# Patient Record
Sex: Male | Born: 1955 | Race: Black or African American | Hispanic: No | Marital: Single | Smoking: Current every day smoker
Health system: Southern US, Community
[De-identification: ages and names within clinical notes are randomized; demographics above are authoritative.]

## PROBLEM LIST (undated history)

## (undated) DIAGNOSIS — E785 Hyperlipidemia, unspecified: Secondary | ICD-10-CM

## (undated) DIAGNOSIS — I6529 Occlusion and stenosis of unspecified carotid artery: Secondary | ICD-10-CM

## (undated) DIAGNOSIS — I10 Essential (primary) hypertension: Secondary | ICD-10-CM

## (undated) DIAGNOSIS — R0600 Dyspnea, unspecified: Secondary | ICD-10-CM

## (undated) DIAGNOSIS — I4892 Unspecified atrial flutter: Secondary | ICD-10-CM

## (undated) DIAGNOSIS — I82409 Acute embolism and thrombosis of unspecified deep veins of unspecified lower extremity: Secondary | ICD-10-CM

## (undated) DIAGNOSIS — I2510 Atherosclerotic heart disease of native coronary artery without angina pectoris: Secondary | ICD-10-CM

## (undated) DIAGNOSIS — G959 Disease of spinal cord, unspecified: Secondary | ICD-10-CM

## (undated) DIAGNOSIS — R0609 Other forms of dyspnea: Secondary | ICD-10-CM

## (undated) DIAGNOSIS — F1910 Other psychoactive substance abuse, uncomplicated: Secondary | ICD-10-CM

## (undated) DIAGNOSIS — I739 Peripheral vascular disease, unspecified: Secondary | ICD-10-CM

## (undated) DIAGNOSIS — I509 Heart failure, unspecified: Secondary | ICD-10-CM

## (undated) DIAGNOSIS — G43909 Migraine, unspecified, not intractable, without status migrainosus: Secondary | ICD-10-CM

## (undated) DIAGNOSIS — D696 Thrombocytopenia, unspecified: Secondary | ICD-10-CM

## (undated) DIAGNOSIS — Z7901 Long term (current) use of anticoagulants: Secondary | ICD-10-CM

## (undated) HISTORY — DX: Acute embolism and thrombosis of unspecified deep veins of unspecified lower extremity: I82.409

## (undated) HISTORY — DX: Unspecified atrial flutter: I48.92

## (undated) HISTORY — DX: Heart failure, unspecified: I50.9

## (undated) HISTORY — DX: Thrombocytopenia, unspecified: D69.6

---

## 2014-07-11 ENCOUNTER — Emergency Department: Payer: Self-pay | Admitting: Emergency Medicine

## 2016-10-26 ENCOUNTER — Encounter: Payer: Self-pay | Admitting: Emergency Medicine

## 2016-10-26 ENCOUNTER — Emergency Department
Admission: EM | Admit: 2016-10-26 | Discharge: 2016-10-27 | Disposition: A | Discharge status: Home / Self Care | Payer: Self-pay | Attending: Emergency Medicine | Admitting: Emergency Medicine

## 2016-10-26 DIAGNOSIS — F10920 Alcohol use, unspecified with intoxication, uncomplicated: Secondary | ICD-10-CM

## 2016-10-26 DIAGNOSIS — Z5181 Encounter for therapeutic drug level monitoring: Secondary | ICD-10-CM | POA: Insufficient documentation

## 2016-10-26 DIAGNOSIS — R159 Full incontinence of feces: Secondary | ICD-10-CM | POA: Insufficient documentation

## 2016-10-26 DIAGNOSIS — F17210 Nicotine dependence, cigarettes, uncomplicated: Secondary | ICD-10-CM | POA: Insufficient documentation

## 2016-10-26 DIAGNOSIS — F1490 Cocaine use, unspecified, uncomplicated: Secondary | ICD-10-CM | POA: Insufficient documentation

## 2016-10-26 DIAGNOSIS — F10120 Alcohol abuse with intoxication, uncomplicated: Secondary | ICD-10-CM | POA: Insufficient documentation

## 2016-10-26 HISTORY — DX: Migraine, unspecified, not intractable, without status migrainosus: G43.909

## 2016-10-26 LAB — COMPREHENSIVE METABOLIC PANEL
ALK PHOS: 74 U/L (ref 38–126)
ALT: 27 U/L (ref 17–63)
ANION GAP: 11 (ref 5–15)
AST: 35 U/L (ref 15–41)
Albumin: 3.7 g/dL (ref 3.5–5.0)
BILIRUBIN TOTAL: 0.9 mg/dL (ref 0.3–1.2)
BUN: 8 mg/dL (ref 6–20)
CALCIUM: 8.9 mg/dL (ref 8.9–10.3)
CO2: 20 mmol/L — ABNORMAL LOW (ref 22–32)
Chloride: 102 mmol/L (ref 101–111)
Creatinine, Ser: 0.98 mg/dL (ref 0.61–1.24)
GFR calc non Af Amer: >60 mL/min (ref >60)
Glucose, Bld: 104 mg/dL — ABNORMAL HIGH (ref 65–99)
POTASSIUM: 3.2 mmol/L — AB (ref 3.5–5.1)
SODIUM: 133 mmol/L — AB (ref 135–145)
TOTAL PROTEIN: 8.2 g/dL — AB (ref 6.5–8.1)

## 2016-10-26 LAB — CBC
HCT: 43.3 % (ref 40.0–52.0)
HEMOGLOBIN: 15.2 g/dL (ref 13.0–18.0)
MCH: 38.8 pg — ABNORMAL HIGH (ref 26.0–34.0)
MCHC: 35.1 g/dL (ref 32.0–36.0)
MCV: 110.7 fL — ABNORMAL HIGH (ref 80.0–100.0)
PLATELETS: 287 10*3/uL (ref 150–440)
RBC: 3.91 MIL/uL — AB (ref 4.40–5.90)
RDW: 14.2 % (ref 11.5–14.5)
WBC: 9.7 10*3/uL (ref 3.8–10.6)

## 2016-10-26 LAB — TROPONIN I: Troponin I: <0.03 ng/mL (ref <0.03)

## 2016-10-26 LAB — ETHANOL: Alcohol, Ethyl (B): 209 mg/dL — ABNORMAL HIGH (ref <5)

## 2016-10-26 NOTE — ED Notes (Signed)
Pt states that he really doesn't have a complaint at this time,states that tonight while he was watching the ballgame with his family he lost bowel continence and states that he has never done that, pt states that he already had an appt to see a pcp next week because his family was concerned about him, he states that they don't feel like he looks right, states that they think he is swollen, pt states that he thought he was just picking up some weight, pt has swelling noted to his abd, none to his lower extremities, pt denies hx of copd or chf, lung sounds are diminished to bilat bases, pt has an open area of skin to his left index finger, pt states that he is uncertain what that is from. Pt states that he smokes and drinks regularly, pt denies any thoughts of SI, states that he made a comment about dying after he soiled himself. But states that he really doesn't want that to happen

## 2016-10-26 NOTE — ED Triage Notes (Signed)
Pt is ambulatory to triage with generalized body aches. Pt states that he lost control of his bowel earlier. Pt states that sometimes he just hurts. Pt states that this evening he stated to his his niece that he was ready to die. Pt states to this RN that he did not mean this. Pt is cooperative at this time with being evaluated for SI.

## 2016-10-26 NOTE — ED Notes (Addendum)
Pt brought to ED by niece; she states pt was incontinent of stool on her couch tonight; she told him he either needed to come for help or leave her house; she says pt has been expressing suicidal thoughts and he is a drug addict;

## 2016-10-26 NOTE — ED Triage Notes (Signed)
Pt states that he has drank two cans of beer this evening and has no feeling in his left index finger but denies trauma.

## 2016-10-26 NOTE — BH Assessment (Signed)
Assessment Note  Shawn Taylor is an 60 y.o. male. Shawn Taylor reports that his niece sent him to the ED after he had a bowel movement on the couch while they were watching football.  He states that he had no choice about coming.  He denied symptoms of depression or anxiety.  He denied having auditory or visual hallucinations.  He denied suicidal or homicidal ideation or intent.  He reports that "once upon a time" he used alcohol and drugs, "But no more".  He does report some body aches, "other than that I'm fine".  Though patient denied the use of alcohol or drugs, he has a BAL of 209. Per report from his niece he uses drugs and alcohol.  Diagnosis:   Past Medical History:  Past Medical History:  Diagnosis Date  . Migraines     History reviewed. No pertinent surgical history.  Family History: No family history on file.  Social History:  reports that he has been smoking Cigarettes.  He has been smoking about 0.50 packs per day. He has never used smokeless tobacco. He reports that he drinks about 1.2 - 1.8 oz of alcohol per week . He reports that he uses drugs, including Cocaine, about 1 time per week.  Additional Social History:  Alcohol / Drug Use History of alcohol / drug use?:  (reports use in the past but not current)  CIWA: CIWA-Ar BP: 126/84 Pulse Rate: 85 COWS:    Allergies: No Known Allergies  Home Medications:  (Not in a hospital admission)  OB/GYN Status:  No LMP for male patient.  General Assessment Data Location of Assessment: [facility] ED TTS Assessment: In system Is this a Tele or Face-to-Face Assessment?: Face-to-Face Is this an Initial Assessment or a Re-assessment for this encounter?: Initial Assessment Marital status: Single Maiden name: n/a Is patient pregnant?: No Pregnancy Status: No Living Arrangements: Other (Comment) ("Here and there, I'm homeless, but been staying with my niec) Can pt return to current living arrangement?:  (unknown) Admission Status:  Voluntary Is patient capable of signing voluntary admission?: Yes Referral Source: Self/Family/Friend Insurance type: None  Medical Screening Exam [facility] Walk-in ONLY) Medical Exam completed: Yes  Crisis Care Plan Living Arrangements: Other (Comment) ("Here and there, I'm homeless, but been staying with my niec) Legal Guardian: Other: (Self) Name of Psychiatrist: None Name of Therapist: None  Education Status Is patient currently in school?: No Current Grade: n/a Highest grade of school patient has completed: 10th, [employer] - GED Name of school: [place], [place] [employer] person: n/a  Risk to self with the past 6 months Suicidal Ideation: No Has patient been a risk to self within the past 6 months prior to admission? : No Suicidal Intent: No Has patient had any suicidal intent within the past 6 months prior to admission? : No Is patient at risk for suicide?: No Suicidal Plan?: No Has patient had any suicidal plan within the past 6 months prior to admission? : No Access to Means: No What has been your use of drugs/alcohol within the last 12 months?: Reports history of of drug use Previous Attempts/Gestures: No How many times?: 0 Other Self Harm Risks: denied Triggers for Past Attempts: None known Intentional Self Injurious Behavior: None Family Suicide History: No Recent stressful life event(s): Other (Comment) (homeless) Persecutory voices/beliefs?: No Depression: No Depression Symptoms:  (None reported) Substance abuse history and/or treatment for substance abuse?: Yes Suicide prevention information given to non-admitted patients: Not applicable  Risk to Others within  the past 6 months Homicidal Ideation: No Does patient have any lifetime risk of violence toward others beyond the six months prior to admission? : No Thoughts of Harm to Others: No Current Homicidal Intent: No Current Homicidal Plan: No Access to Homicidal Means: No Identified Victim: None  identified History of harm to others?: No Assessment of Violence: None Noted Violent Behavior Description: denied Does patient have access to weapons?: No Criminal Charges Pending?: No Does patient have a court date: No Is patient on probation?: No  Psychosis Hallucinations: None noted Delusions: None noted  Mental Status Report Appearance/Hygiene: In scrubs Eye Contact: Good Motor Activity: Unremarkable Speech: Logical/coherent Level of Consciousness: Alert Mood: Pleasant Affect: Appropriate to circumstance Anxiety Level: None Thought Processes: Coherent Judgement: Unimpaired Orientation: Person, Place, Time, Situation Obsessive Compulsive Thoughts/Behaviors: None  Cognitive Functioning Concentration: Normal Memory: Recent Intact IQ: Average Insight: Fair Impulse Control: Fair Appetite: Good Sleep: No Change Vegetative Symptoms: None  ADLScreening [facility] Assessment Services) Patient's cognitive ability adequate to safely complete daily activities?: Yes Patient able to express need for assistance with ADLs?: Yes Independently performs ADLs?: Yes (appropriate for developmental age)  Prior Inpatient Therapy Prior Inpatient Therapy: No Prior Therapy Dates: n/a Prior Therapy Facilty/Provider(s): n/a Reason for Treatment: n/a  Prior Outpatient Therapy Prior Outpatient Therapy: No Prior Therapy Dates: n/a Prior Therapy Facilty/Provider(s): n/a Reason for Treatment: n/a Does patient have an ACCT team?: No Does patient have Intensive In-House Services?  : No Does patient have [facility] services? : No Does patient have P4CC services?: No  ADL Screening (condition at time of admission) Patient's cognitive ability adequate to safely complete daily activities?: Yes Patient able to express need for assistance with ADLs?: Yes Independently performs ADLs?: Yes (appropriate for developmental age)       Abuse/Neglect Assessment (Assessment to be complete while patient is  alone) Physical Abuse: Denies Verbal Abuse: Denies Sexual Abuse: Denies Exploitation of patient/patient's resources: Denies Self-Neglect: Denies     Merchant navy officerAdvance Directives (For Healthcare) Does Patient Have a Medical Advance Directive?: No Would patient like information on creating a medical advance directive?: No - Patient declined    Additional Information 1:1 In Past 12 Months?: No CIRT Risk: No Elopement Risk: No Does patient have medical clearance?: Yes     Disposition:  Disposition Initial Assessment Completed for this Encounter: Yes Disposition of Patient: Other dispositions  On Site Evaluation by:   Reviewed with Physician:    Justice DeedsKeisha Merriel Zinger 10/26/2016 11:25 PM

## 2016-10-27 LAB — URINE DRUG SCREEN, QUALITATIVE (ARMC ONLY)
Amphetamines, Ur Screen: NOT DETECTED
BARBITURATES, UR SCREEN: NOT DETECTED
Benzodiazepine, Ur Scrn: NOT DETECTED
CANNABINOID 50 NG, UR ~~LOC~~: NOT DETECTED
COCAINE METABOLITE, UR ~~LOC~~: POSITIVE — AB
MDMA (ECSTASY) UR SCREEN: NOT DETECTED
METHADONE SCREEN, URINE: NOT DETECTED
Opiate, Ur Screen: NOT DETECTED
Phencyclidine (PCP) Ur S: NOT DETECTED
TRICYCLIC, UR SCREEN: NOT DETECTED

## 2016-10-27 MED ORDER — ADULT MULTIVITAMIN W/MINERALS CH
1.0000 | ORAL_TABLET | Freq: Once | ORAL | Status: AC
  Administered 2016-10-27: 1 via ORAL
  Filled 2016-10-27: qty 1

## 2016-10-27 MED ORDER — VITAMIN B-1 100 MG PO TABS
100.0000 mg | ORAL_TABLET | Freq: Once | ORAL | Status: AC
  Administered 2016-10-27: 100 mg via ORAL
  Filled 2016-10-27: qty 1

## 2016-10-27 MED ORDER — FOLIC ACID 1 MG PO TABS
1.0000 mg | ORAL_TABLET | Freq: Once | ORAL | Status: AC
  Administered 2016-10-27: 1 mg via ORAL
  Filled 2016-10-27: qty 1

## 2016-10-27 MED ORDER — MAGNESIUM OXIDE 400 (241.3 MG) MG PO TABS
400.0000 mg | ORAL_TABLET | Freq: Once | ORAL | Status: AC
  Administered 2016-10-27: 400 mg via ORAL
  Filled 2016-10-27: qty 1

## 2016-10-27 NOTE — ED Notes (Signed)
Pt resting quietly, eyes closed, with lights turned down for comfort, no distress noted, cont to monitor

## 2016-10-27 NOTE — ED Notes (Signed)
Dr Zenda Alpers at bedside with pt

## 2016-10-27 NOTE — ED Provider Notes (Signed)
[facility] Emergency Department Provider Note   ____________________________________________   First MD Initiated Contact with Patient 10/26/16 2335     (approximate)  I have reviewed the triage vital signs and the nursing notes.   HISTORY  Chief Complaint Generalized Body Aches    HPI Shawn Taylor is a 60 y.o. male who reports he is here tonight because of his knees. He reports that he was watching a football game tonight when he coughed and he had a small bowel movement. The patient reports that he didn't realize that he had to go. He reports that it like when you pass gas and some comes out as well. He reports that it wasn't a lot it was more like this words. His niece became concerned so she decided to have him come here. The patient reports that his family threatened him that if he didn't calm they would kick him out of the house. The patient states that his niece doesn't think he is well. They state that his face has been swelling and his abdomen is swelling. The patient reports that he made an appointment for a physical on December 16. He reports that he has some pain in his left hip that is chronic. He denies any chest pain or shortness of breath. The patient's niece did drop him off. He was drinking today about 3-4 beers and reports that he does drink daily. He reports that he thinks that may be he is sick but he is not sure. He thought he might of just been gaining some weight. The patient denies any chest pain, nausea, vomiting. He does have some occasional migraines. According to the patient he also urinates frequently. The patient's niece reports that he did make a statement about killing himself after he had the bowel movement. The patient reports that he stated that if things would benefit be the way that they are he might as well be dead. The patient's family reports that he drinks and does drugs and he has been talking to himself. They wanted him  evaluated. The patient deniesany complaints at this time.   Past Medical History:  Diagnosis Date  . Migraines     There are no active problems to display for this patient.   History reviewed. No pertinent surgical history.  Prior to Admission medications   Not on File    Allergies Patient has no known allergies.  No family history on file.  Social History Social History  Substance Use Topics  . Smoking status: Current Every Day Smoker    Packs/day: 0.50    Types: Cigarettes  . Smokeless tobacco: Never Used  . Alcohol use 1.2 - 1.8 oz/week    2 - 3 Cans of beer per week    Review of Systems Constitutional: No fever/chills Eyes: No visual changes. ENT: No sore throat. Cardiovascular: Denies chest pain. Respiratory: Denies shortness of breath. Gastrointestinal: No abdominal pain.  No nausea, no vomiting.  No diarrhea.  No constipation. Genitourinary: Negative for dysuria. Musculoskeletal: Negative for back pain. Skin: Negative for rash. Neurological: Negative for headaches, focal weakness or numbness.  10-point ROS otherwise negative.  ____________________________________________   PHYSICAL EXAM:  VITAL SIGNS: ED Triage Vitals [10/26/16 2132]  Enc Vitals Group     BP 126/84     Pulse Rate 85     Resp 18     Temp 97.6 F (36.4 C)     Temp Source Oral     SpO2 97 %  Weight 19 lb (8.618 kg)     Height 5\' 11"  (1.803 m)     Head Circumference      Peak Flow      Pain Score 0     Pain Loc      Pain Edu?      Excl. in GC?     Constitutional: Alert and oriented. Well appearing and in no acute distress. Eyes: Conjunctivae are normal. PERRL. EOMI. Head: Atraumatic. Nose: No congestion/rhinnorhea. Mouth/Throat: Mucous membranes are moist.  Oropharynx non-erythematous. Cardiovascular: Normal rate, regular rhythm. Grossly normal heart sounds.  Good peripheral circulation. Respiratory: Normal respiratory effort.  No retractions. Lungs  CTAB. Gastrointestinal: Soft and nontender. Abdominal distention. Positive bowel sounds Musculoskeletal: No lower extremity tenderness nor edema.   Neurologic:  Normal speech and language.  Skin:  Skin is warm, dry and intact.  Psychiatric: Mood and affect are normal.   ____________________________________________   LABS (all labs ordered are listed, but only abnormal results are displayed)  Labs Reviewed  COMPREHENSIVE METABOLIC PANEL - Abnormal; Notable for the following:       Result Value   Sodium 133 (*)    Potassium 3.2 (*)    CO2 20 (*)    Glucose, Bld 104 (*)    Total Protein 8.2 (*)    All other components within normal limits  ETHANOL - Abnormal; Notable for the following:    Alcohol, Ethyl (B) 209 (*)    All other components within normal limits  CBC - Abnormal; Notable for the following:    RBC 3.91 (*)    MCV 110.7 (*)    MCH 38.8 (*)    All other components within normal limits  URINE DRUG SCREEN, QUALITATIVE ([facility] ONLY) - Abnormal; Notable for the following:    Cocaine Metabolite,[place] POSITIVE (*)    All other components within normal limits  TROPONIN I   ____________________________________________  EKG  ED ECG REPORT I, Rebecka Apley, the attending physician, personally viewed and interpreted this ECG.   Date: 10/28/2016  EKG Time: 2157  Rate: 85  Rhythm: normal sinus rhythm  Axis: normal  Intervals:none  ST&T Change: none  ____________________________________________  RADIOLOGY  none ____________________________________________   PROCEDURES  Procedure(s) performed: None  Procedures  Critical Care performed: No  ____________________________________________   INITIAL IMPRESSION / ASSESSMENT AND PLAN / ED COURSE  Pertinent labs & imaging results that were available during my care of the patient were reviewed by me and considered in my medical decision making (see chart for details).  This is a 60 year old male who was sent  into the hospital today by his niece. The patient denies any complaints at this time. I will have the patient evaluated by the specialist on-call. The patient does have a prolonged QTC so I will give him some magnesium oxide. The patient's ethanol is elevated but the remainder of his blood work is unremarkable.  Clinical Course    It is possible that the patient's fecal incontinence is due to his substance abuse. He does have cocaine in his urine and he is intoxicated. The patient's family is very frustrated with him and they report that they don't want to have anything to do with him. I will have the specialist on call evaluate him to determine if the patient needs any inpatient therapy. I discussed with the family though that he may need some outpatient help and there may not be much that we can do for him in the hospital.  The patient was seen by specialist on call who feels that the patient can be seen as an outpatient. I did offer the patient a rectal exam given his fecal incontinence but he declined. I will discharge the patient to have him follow up with his appointment which she reports as already scheduled next week.  ____________________________________________   FINAL CLINICAL IMPRESSION(S) / ED DIAGNOSES  Final diagnoses:  Alcoholic intoxication without complication (HCC)  Incontinence of feces, unspecified fecal incontinence type      NEW MEDICATIONS STARTED DURING THIS VISIT:  New Prescriptions   No medications on file     Note:  This document was prepared using Dragon voice recognition software and may include unintentional dictation errors.    Rebecka Apley, MD 10/27/16 [phone]

## 2016-10-27 NOTE — ED Notes (Signed)
Pt waiting on belongings for discharge. Family will be here for pt in next 30 minutes.

## 2016-10-27 NOTE — ED Notes (Signed)
Belongings returned to pt. Pt getting dressed.

## 2016-10-27 NOTE — ED Notes (Addendum)
Pt resting quietly, cont to monitor °

## 2017-11-09 ENCOUNTER — Other Ambulatory Visit: Payer: Self-pay

## 2017-11-09 ENCOUNTER — Emergency Department: Payer: Self-pay

## 2017-11-09 ENCOUNTER — Encounter: Payer: Self-pay | Admitting: Emergency Medicine

## 2017-11-09 ENCOUNTER — Emergency Department
Admission: EM | Admit: 2017-11-09 | Discharge: 2017-11-09 | Disposition: A | Discharge status: Home / Self Care | Payer: Self-pay | Attending: Emergency Medicine | Admitting: Emergency Medicine

## 2017-11-09 DIAGNOSIS — F17210 Nicotine dependence, cigarettes, uncomplicated: Secondary | ICD-10-CM | POA: Insufficient documentation

## 2017-11-09 DIAGNOSIS — J209 Acute bronchitis, unspecified: Secondary | ICD-10-CM | POA: Insufficient documentation

## 2017-11-09 MED ORDER — PREDNISONE 50 MG PO TABS: ORAL_TABLET | ORAL | 0 refills | Status: DC

## 2017-11-09 MED ORDER — AZITHROMYCIN 250 MG PO TABS: ORAL_TABLET | ORAL | 0 refills | Status: AC

## 2017-11-09 NOTE — ED Notes (Signed)
Pt has a cough for several weeks.  Pt coughing up beige phlegm.  No fever.  No chest pain or sob.  Pt reports pain in bil rib area from coughing so much.   cig smoker.  Pt alert.

## 2017-11-09 NOTE — ED Triage Notes (Signed)
Cough x 1 month, no apparent distress.

## 2017-11-09 NOTE — ED Provider Notes (Signed)
[facility] Emergency Department Provider Note  ____________________________________________  Time seen: Approximately 6:07 PM  I have reviewed the triage vital signs and the nursing notes.   HISTORY  Chief Complaint Cough    HPI Shawn Taylor is a 61 y.o. male presents to the emergency department with productive cough or purulent sputum production for the past 1 month.  Patient denies associated shortness of breath tightness.  Patient reports that his symptoms originally started with a viral upper respiratory tract infection.  Patient denies fatigue or weakness.  No recent travel.  Patient is tolerating fluids by mouth.  Patient is a daily smoker.   Past Medical History:  Diagnosis Date  . Migraines     There are no active problems to display for this patient.   History reviewed. No pertinent surgical history.  Prior to Admission medications   Medication Sig Start Date End Date Taking? Authorizing Provider  azithromycin (ZITHROMAX Z-PAK) 250 MG tablet Take 2 tablets (500 mg) on  Day 1,  followed by 1 tablet (250 mg) once daily on Days 2 through 5. 11/09/17 11/14/17  Orvil FeilWoods, Amaryah Mallen M, PA-C  predniSONE (DELTASONE) 50 MG tablet Take one 50 mg tablet once a day for 5 days. 11/09/17   Orvil FeilWoods, Merna Baldi M, PA-C    Allergies Patient has no known allergies.  No family history on file.  Social History Social History   Tobacco Use  . Smoking status: Current Every Day Smoker    Packs/day: 0.50    Types: Cigarettes  . Smokeless tobacco: Never Used  Substance Use Topics  . Alcohol use: Yes    Alcohol/week: 1.2 - 1.8 oz    Types: 2 - 3 Cans of beer per week  . Drug use: Yes    Frequency: 1.0 times per week    Types: Cocaine     Review of Systems  Constitutional: No fever/chills Eyes: No visual changes. No discharge ENT: No upper respiratory complaints. Cardiovascular: no chest pain. Respiratory: Patient has productive cough.  No  SOB. Musculoskeletal: Negative for musculoskeletal pain. Skin: Negative for rash, abrasions, lacerations, ecchymosis. Neurological: Negative for headaches, focal weakness or numbness.   ____________________________________________   PHYSICAL EXAM:  VITAL SIGNS: ED Triage Vitals  Enc Vitals Group     BP 11/09/17 1655 136/66     Pulse Rate 11/09/17 1655 76     Resp 11/09/17 1655 20     Temp 11/09/17 1655 (!) 97.5 F (36.4 C)     Temp Source 11/09/17 1655 Oral     SpO2 11/09/17 1655 94 %     Weight 11/09/17 1656 190 lb (86.2 kg)     Height 11/09/17 1656 5\' 10"  (1.778 m)     Head Circumference --      Peak Flow --      Pain Score --      Pain Loc --      Pain Edu? --      Excl. in GC? --      Constitutional: Alert and oriented. Well appearing and in no acute distress. Eyes: Conjunctivae are normal. PERRL. EOMI. Head: Atraumatic. ENT:      Ears: TMs are pearly bilaterally.      Nose: No congestion/rhinnorhea.      Mouth/Throat: Mucous membranes are moist.  Cardiovascular: Normal rate, regular rhythm. Normal S1 and S2.  Good peripheral circulation. Respiratory: Normal respiratory effort without tachypnea or retractions. Lungs CTAB. Good air entry to the bases with no decreased or absent breath  sounds. Skin:  Skin is warm, dry and intact. No rash noted. Psychiatric: Mood and affect are normal. Speech and behavior are normal. Patient exhibits appropriate insight and judgement.   ____________________________________________   LABS (all labs ordered are listed, but only abnormal results are displayed)  Labs Reviewed - No data to display ____________________________________________  EKG   ____________________________________________  RADIOLOGY Geraldo Pitter, personally viewed and evaluated these images (plain radiographs) as part of my medical decision making, as well as reviewing the written report by the radiologist.  Dg Chest 2 View  Result Date:  11/09/2017 CLINICAL DATA:  Cough for 1 month EXAM: CHEST  2 VIEW COMPARISON:  None. FINDINGS: Hyperinflation. Mild bronchitic changes. No focal consolidation or effusion. Borderline cardiomegaly. Aortic atherosclerosis. No pneumothorax. IMPRESSION: 1. Hyperinflation with mild bronchitic changes. No focal pulmonary opacity is seen 2. Borderline cardiomegaly Electronically Signed   By: Jasmine Pang M.D.   On: 11/09/2017 17:41    ____________________________________________    PROCEDURES  Procedure(s) performed:    Procedures    Medications - No data to display   ____________________________________________   INITIAL IMPRESSION / ASSESSMENT AND PLAN / ED COURSE  Pertinent labs & imaging results that were available during my care of the patient were reviewed by me and considered in my medical decision making (see chart for details).  Review of the [place] CSRS was performed in accordance of the [facility] prior to dispensing any controlled drugs.     Assessment and plan Acute bronchitis Patient presented to the emergency department with productive cough for approximately 1 month.  Differential diagnosis included acute bronchitis versus community-acquired pneumonia.  Patient had no consolidations visualized on chest x-ray and has been without fatigue, shortness of breath and fever, decreasing suspicion for community-acquired pneumonia.  Patient was treated empirically for acute bronchitis with azithromycin and prednisone and advised to follow-up with primary care as needed.  All patient questions were answered.    ____________________________________________  FINAL CLINICAL IMPRESSION(S) / ED DIAGNOSES  Final diagnoses:  Acute bronchitis, unspecified organism      NEW MEDICATIONS STARTED DURING THIS VISIT:  ED Discharge Orders        Ordered    azithromycin (ZITHROMAX Z-PAK) 250 MG tablet     11/09/17 1800    predniSONE (DELTASONE) 50 MG tablet     11/09/17 1800           This chart was dictated using voice recognition software/Dragon. Despite best efforts to proofread, errors can occur which can change the meaning. Any change was purely unintentional.    Orvil Feil, PA-C 11/09/17 1810    Dionne Bucy, MD 11/09/17 2122

## 2017-11-23 ENCOUNTER — Other Ambulatory Visit: Payer: Self-pay

## 2017-11-23 ENCOUNTER — Emergency Department: Payer: Self-pay

## 2017-11-23 ENCOUNTER — Emergency Department
Admission: EM | Admit: 2017-11-23 | Discharge: 2017-11-23 | Disposition: A | Discharge status: Home / Self Care | Payer: Self-pay | Attending: Emergency Medicine | Admitting: Emergency Medicine

## 2017-11-23 DIAGNOSIS — R059 Cough, unspecified: Secondary | ICD-10-CM

## 2017-11-23 DIAGNOSIS — R05 Cough: Secondary | ICD-10-CM | POA: Insufficient documentation

## 2017-11-23 DIAGNOSIS — R202 Paresthesia of skin: Secondary | ICD-10-CM | POA: Insufficient documentation

## 2017-11-23 DIAGNOSIS — R55 Syncope and collapse: Secondary | ICD-10-CM | POA: Insufficient documentation

## 2017-11-23 DIAGNOSIS — F17210 Nicotine dependence, cigarettes, uncomplicated: Secondary | ICD-10-CM | POA: Insufficient documentation

## 2017-11-23 LAB — URINALYSIS, COMPLETE (UACMP) WITH MICROSCOPIC
BACTERIA UA: NONE SEEN
BILIRUBIN URINE: NEGATIVE
GLUCOSE, UA: NEGATIVE mg/dL
KETONES UR: NEGATIVE mg/dL
Leukocytes, UA: NEGATIVE
NITRITE: NEGATIVE
PH: 5 (ref 5.0–8.0)
Protein, ur: NEGATIVE mg/dL
Specific Gravity, Urine: 1.005 (ref 1.005–1.030)

## 2017-11-23 LAB — CBC
HEMATOCRIT: 43.1 % (ref 40.0–52.0)
Hemoglobin: 14.7 g/dL (ref 13.0–18.0)
MCH: 31.7 pg (ref 26.0–34.0)
MCHC: 34.1 g/dL (ref 32.0–36.0)
MCV: 92.8 fL (ref 80.0–100.0)
PLATELETS: 278 10*3/uL (ref 150–440)
RBC: 4.65 MIL/uL (ref 4.40–5.90)
RDW: 13.6 % (ref 11.5–14.5)
WBC: 7.1 10*3/uL (ref 3.8–10.6)

## 2017-11-23 LAB — BASIC METABOLIC PANEL
ANION GAP: 10 (ref 5–15)
BUN: 7 mg/dL (ref 6–20)
CO2: 24 mmol/L (ref 22–32)
Calcium: 8.2 mg/dL — ABNORMAL LOW (ref 8.9–10.3)
Chloride: 97 mmol/L — ABNORMAL LOW (ref 101–111)
Creatinine, Ser: 0.85 mg/dL (ref 0.61–1.24)
Glucose, Bld: 115 mg/dL — ABNORMAL HIGH (ref 65–99)
POTASSIUM: 3.6 mmol/L (ref 3.5–5.1)
SODIUM: 131 mmol/L — AB (ref 135–145)

## 2017-11-23 MED ORDER — ALBUTEROL SULFATE HFA 108 (90 BASE) MCG/ACT IN AERS: 2.0000 | INHALATION_SPRAY | Freq: Four times a day (QID) | RESPIRATORY_TRACT | 0 refills | Status: DC | PRN

## 2017-11-23 MED ORDER — BENZONATATE 100 MG PO CAPS: 100.0000 mg | ORAL_CAPSULE | Freq: Four times a day (QID) | ORAL | 0 refills | Status: AC | PRN

## 2017-11-23 NOTE — ED Triage Notes (Signed)
Pt reports cough x1.5 months, reports syncopal episode today, unwitnessed. Pt denies headache or neck pain.

## 2017-11-23 NOTE — ED Provider Notes (Signed)
[facility] Emergency Department Provider Note _________________________________________   I have reviewed the triage vital signs and the nursing notes.   HISTORY  Chief Complaint Loss of Consciousness   History limited by: Not Limited   HPI Shawn Taylor is a 61 y.o. male who presents to the emergency department today because of concern for syncope and continued cough.  TIMING: occurred today SEVERITY: LOC CONTEXT: patient states that he has had a cough for over one week. Was seen in the emergency department for the cough and states he was given steroids and antibiotics which have not given any significant relief. Today during a coughing fit he passed out.  MODIFYING FACTORS: coughing is worse at night ASSOCIATED SYMPTOMS: denies any fevers. Denies any chest pain except for some with cough.  ADDITIONAL COMPLAINTS: left hand and foot numbness which has been going on for months. Has not seen anyone for it.  Per medical record review patient has a history of migraine.   Past Medical History:  Diagnosis Date  . Migraines     There are no active problems to display for this patient.   No past surgical history on file.  Prior to Admission medications   Medication Sig Start Date End Date Taking? Authorizing Provider  predniSONE (DELTASONE) 50 MG tablet Take one 50 mg tablet once a day for 5 days. 11/09/17   Orvil FeilWoods, Jaclyn M, PA-C    Allergies Patient has no known allergies.  No family history on file.  Social History Social History   Tobacco Use  . Smoking status: Current Every Day Smoker    Packs/day: 0.50    Types: Cigarettes  . Smokeless tobacco: Never Used  Substance Use Topics  . Alcohol use: Yes    Alcohol/week: 1.2 - 1.8 oz    Types: 2 - 3 Cans of beer per week  . Drug use: Yes    Frequency: 1.0 times per week    Types: Cocaine    Review of Systems Constitutional: No fever/chills Eyes: No visual changes. ENT: No sore  throat. Cardiovascular: Positive for chest pain. Respiratory: Positive for cough. Gastrointestinal: No abdominal pain.  No nausea, no vomiting.  No diarrhea.   Genitourinary: Negative for dysuria. Musculoskeletal: Negative for back pain. Skin: Negative for rash. Neurological: Positive for left arm and leg numbness.  ____________________________________________   PHYSICAL EXAM:  VITAL SIGNS: ED Triage Vitals  Enc Vitals Group     BP 11/23/17 1548 (!) 110/91     Pulse Rate 11/23/17 1548 88     Resp 11/23/17 1548 16     Temp 11/23/17 1548 98 F (36.7 C)     Temp Source 11/23/17 1548 Oral     SpO2 11/23/17 1548 97 %     Weight 11/23/17 1548 190 lb (86.2 kg)     Height 11/23/17 1548 5\' 10"  (1.778 m)     Head Circumference --      Peak Flow --      Pain Score 11/23/17 1550 0   Constitutional: Alert and oriented. Well appearing and in no distress. Eyes: Conjunctivae are normal.  ENT   Head: Normocephalic and atraumatic.   Nose: No congestion/rhinnorhea.   Mouth/Throat: Mucous membranes are moist.   Neck: No stridor. Hematological/Lymphatic/Immunilogical: No cervical lymphadenopathy. Cardiovascular: Normal rate, regular rhythm.  No murmurs, rubs, or gallops.  Respiratory: Normal respiratory effort without tachypnea nor retractions. Breath sounds are clear and equal bilaterally. No wheezes/rales/rhonchi. Gastrointestinal: Soft and non tender. No rebound. No guarding.  Genitourinary: Deferred Musculoskeletal: Normal range of motion in all extremities. No lower extremity edema. Neurologic:  Normal speech and language. No gross focal neurologic deficits are appreciated.  Skin:  Skin is warm, dry and intact. No rash noted. Psychiatric: Mood and affect are normal. Speech and behavior are normal. Patient exhibits appropriate insight and judgment.  ____________________________________________    LABS (pertinent positives/negatives)  CBC wnl BMP na 131, glu 115, cr  0.85 UA not consistent with infection  ____________________________________________   EKG  I, Phineas Semen, attending physician, personally viewed and interpreted this EKG  EKG Time: 1550 Rate: 84 Rhythm: sinus rhythm with PAC Axis: normal Intervals: qtc 465 QRS: narrow, lvh ST changes: no st elevation Impression: abnormal ekg   ____________________________________________    RADIOLOGY  CT head/cervical spine No acute fracture or bleed  ____________________________________________   PROCEDURES  Procedures  ____________________________________________   INITIAL IMPRESSION / ASSESSMENT AND PLAN / ED COURSE  Pertinent labs & imaging results that were available during my care of the patient were reviewed by me and considered in my medical decision making (see chart for details).  Patient presented to the emergency department today with primary complaint of a syncopal episode that occurred during a coughing fit and secondary complaint of continued cough. Patient had negative x-ray done at previous ED visit. No abnormal breath sounds, fevers or leukocytosis to suggest pneumonia. Patient did fall, head CT and cervical spine CT negative for acute fracture. Mildly hyponatremic, however was hyponatremic at previous visit and doubt this would cause the syncopal episode. At this point think likely patient with bronchitis, will give prescription for cough medication and inhaler. Discussed plan with patient. Discussed importance of follow up with PCP, will give PCP resources.    ____________________________________________   FINAL CLINICAL IMPRESSION(S) / ED DIAGNOSES  Final diagnoses:  Cough  Syncope, unspecified syncope type  Paresthesia     Note: This dictation was prepared with Dragon dictation. Any transcriptional errors that result from this process are unintentional     Phineas Semen, MD 11/23/17 [phone]

## 2017-11-23 NOTE — ED Notes (Signed)
AAOx3.  Skin warm and dry.  NAD 

## 2017-11-23 NOTE — Discharge Instructions (Signed)
Please seek medical attention for any high fevers, chest pain, shortness of breath, change in behavior, persistent vomiting, bloody stool or any other new or concerning symptoms.  

## 2017-11-24 DIAGNOSIS — B1920 Unspecified viral hepatitis C without hepatic coma: Secondary | ICD-10-CM

## 2017-11-24 HISTORY — DX: Unspecified viral hepatitis C without hepatic coma: B19.20

## 2018-04-06 ENCOUNTER — Emergency Department: Payer: Self-pay

## 2018-04-06 ENCOUNTER — Emergency Department
Admission: EM | Admit: 2018-04-06 | Discharge: 2018-04-06 | Disposition: A | Discharge status: Home / Self Care | Payer: Self-pay | Attending: Emergency Medicine | Admitting: Emergency Medicine

## 2018-04-06 ENCOUNTER — Other Ambulatory Visit: Payer: Self-pay

## 2018-04-06 DIAGNOSIS — Z23 Encounter for immunization: Secondary | ICD-10-CM | POA: Insufficient documentation

## 2018-04-06 DIAGNOSIS — T148XXA Other injury of unspecified body region, initial encounter: Secondary | ICD-10-CM

## 2018-04-06 DIAGNOSIS — Y9389 Activity, other specified: Secondary | ICD-10-CM | POA: Insufficient documentation

## 2018-04-06 DIAGNOSIS — S20211A Contusion of right front wall of thorax, initial encounter: Secondary | ICD-10-CM | POA: Insufficient documentation

## 2018-04-06 DIAGNOSIS — F1410 Cocaine abuse, uncomplicated: Secondary | ICD-10-CM | POA: Insufficient documentation

## 2018-04-06 DIAGNOSIS — S298XXA Other specified injuries of thorax, initial encounter: Secondary | ICD-10-CM

## 2018-04-06 DIAGNOSIS — W108XXA Fall (on) (from) other stairs and steps, initial encounter: Secondary | ICD-10-CM | POA: Insufficient documentation

## 2018-04-06 DIAGNOSIS — S50312A Abrasion of left elbow, initial encounter: Secondary | ICD-10-CM | POA: Insufficient documentation

## 2018-04-06 DIAGNOSIS — Y9289 Other specified places as the place of occurrence of the external cause: Secondary | ICD-10-CM | POA: Insufficient documentation

## 2018-04-06 DIAGNOSIS — Y998 Other external cause status: Secondary | ICD-10-CM | POA: Insufficient documentation

## 2018-04-06 DIAGNOSIS — F17210 Nicotine dependence, cigarettes, uncomplicated: Secondary | ICD-10-CM | POA: Insufficient documentation

## 2018-04-06 MED ORDER — PREDNISONE 10 MG PO TABS: 40.0000 mg | ORAL_TABLET | Freq: Every day | ORAL | 0 refills | Status: DC

## 2018-04-06 MED ORDER — TRAMADOL HCL 50 MG PO TABS: 50.0000 mg | ORAL_TABLET | Freq: Four times a day (QID) | ORAL | 0 refills | Status: DC | PRN

## 2018-04-06 MED ORDER — TETANUS-DIPHTH-ACELL PERTUSSIS 5-2.5-18.5 LF-MCG/0.5 IM SUSP
0.5000 mL | Freq: Once | INTRAMUSCULAR | Status: AC
  Administered 2018-04-06: 0.5 mL via INTRAMUSCULAR
  Filled 2018-04-06: qty 0.5

## 2018-04-06 MED ORDER — BACITRACIN ZINC 500 UNIT/GM EX OINT
1.0000 "application " | TOPICAL_OINTMENT | Freq: Once | CUTANEOUS | Status: AC
  Administered 2018-04-06: 1 via TOPICAL
  Filled 2018-04-06: qty 0.9
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
## 2018-04-06 NOTE — Discharge Instructions (Addendum)
Follow-up with your regular doctor if not better in 3 to 5 days.  Return to the emergency department if you are worsening.  Use medication as prescribed.  Apply ice to your ribs.  This will help with the pain and swelling in this area.
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
## 2020-02-12 DIAGNOSIS — I469 Cardiac arrest, cause unspecified: Secondary | ICD-10-CM

## 2020-02-12 DIAGNOSIS — I219 Acute myocardial infarction, unspecified: Secondary | ICD-10-CM

## 2020-02-12 HISTORY — DX: Acute myocardial infarction, unspecified: I21.9

## 2020-02-12 HISTORY — DX: Cardiac arrest, cause unspecified: I46.9

## 2020-02-13 ENCOUNTER — Inpatient Hospital Stay: Payer: Medicaid Other

## 2020-02-13 ENCOUNTER — Other Ambulatory Visit: Payer: Self-pay

## 2020-02-13 ENCOUNTER — Emergency Department: Payer: Medicaid Other

## 2020-02-13 ENCOUNTER — Inpatient Hospital Stay
Admission: EM | Admit: 2020-02-13 | Discharge: 2020-03-07 | DRG: 291 | Disposition: A | Discharge status: Home Health | Payer: Medicaid Other | Attending: Internal Medicine | Admitting: Internal Medicine

## 2020-02-13 DIAGNOSIS — Z7289 Other problems related to lifestyle: Secondary | ICD-10-CM

## 2020-02-13 DIAGNOSIS — K7290 Hepatic failure, unspecified without coma: Secondary | ICD-10-CM | POA: Diagnosis present

## 2020-02-13 DIAGNOSIS — F05 Delirium due to known physiological condition: Secondary | ICD-10-CM | POA: Diagnosis not present

## 2020-02-13 DIAGNOSIS — F17210 Nicotine dependence, cigarettes, uncomplicated: Secondary | ICD-10-CM | POA: Diagnosis present

## 2020-02-13 DIAGNOSIS — G9341 Metabolic encephalopathy: Secondary | ICD-10-CM | POA: Diagnosis present

## 2020-02-13 DIAGNOSIS — J9602 Acute respiratory failure with hypercapnia: Secondary | ICD-10-CM | POA: Diagnosis present

## 2020-02-13 DIAGNOSIS — E162 Hypoglycemia, unspecified: Secondary | ICD-10-CM | POA: Diagnosis present

## 2020-02-13 DIAGNOSIS — Z515 Encounter for palliative care: Secondary | ICD-10-CM | POA: Diagnosis not present

## 2020-02-13 DIAGNOSIS — I484 Atypical atrial flutter: Secondary | ICD-10-CM | POA: Diagnosis present

## 2020-02-13 DIAGNOSIS — Z7952 Long term (current) use of systemic steroids: Secondary | ICD-10-CM

## 2020-02-13 DIAGNOSIS — I5021 Acute systolic (congestive) heart failure: Secondary | ICD-10-CM

## 2020-02-13 DIAGNOSIS — Z4659 Encounter for fitting and adjustment of other gastrointestinal appliance and device: Secondary | ICD-10-CM

## 2020-02-13 DIAGNOSIS — N170 Acute kidney failure with tubular necrosis: Secondary | ICD-10-CM | POA: Diagnosis present

## 2020-02-13 DIAGNOSIS — I10 Essential (primary) hypertension: Secondary | ICD-10-CM

## 2020-02-13 DIAGNOSIS — I5023 Acute on chronic systolic (congestive) heart failure: Secondary | ICD-10-CM | POA: Diagnosis present

## 2020-02-13 DIAGNOSIS — R0902 Hypoxemia: Secondary | ICD-10-CM

## 2020-02-13 DIAGNOSIS — Z452 Encounter for adjustment and management of vascular access device: Secondary | ICD-10-CM

## 2020-02-13 DIAGNOSIS — E871 Hypo-osmolality and hyponatremia: Secondary | ICD-10-CM | POA: Diagnosis present

## 2020-02-13 DIAGNOSIS — F1410 Cocaine abuse, uncomplicated: Secondary | ICD-10-CM | POA: Diagnosis present

## 2020-02-13 DIAGNOSIS — R042 Hemoptysis: Secondary | ICD-10-CM | POA: Diagnosis not present

## 2020-02-13 DIAGNOSIS — R1310 Dysphagia, unspecified: Secondary | ICD-10-CM | POA: Diagnosis not present

## 2020-02-13 DIAGNOSIS — I4892 Unspecified atrial flutter: Secondary | ICD-10-CM | POA: Diagnosis present

## 2020-02-13 DIAGNOSIS — R570 Cardiogenic shock: Secondary | ICD-10-CM | POA: Diagnosis present

## 2020-02-13 DIAGNOSIS — R079 Chest pain, unspecified: Secondary | ICD-10-CM

## 2020-02-13 DIAGNOSIS — J9601 Acute respiratory failure with hypoxia: Secondary | ICD-10-CM

## 2020-02-13 DIAGNOSIS — D72829 Elevated white blood cell count, unspecified: Secondary | ICD-10-CM

## 2020-02-13 DIAGNOSIS — I82433 Acute embolism and thrombosis of popliteal vein, bilateral: Secondary | ICD-10-CM | POA: Diagnosis present

## 2020-02-13 DIAGNOSIS — F1010 Alcohol abuse, uncomplicated: Secondary | ICD-10-CM | POA: Diagnosis present

## 2020-02-13 DIAGNOSIS — I509 Heart failure, unspecified: Secondary | ICD-10-CM

## 2020-02-13 DIAGNOSIS — I82403 Acute embolism and thrombosis of unspecified deep veins of lower extremity, bilateral: Secondary | ICD-10-CM | POA: Diagnosis present

## 2020-02-13 DIAGNOSIS — R0600 Dyspnea, unspecified: Secondary | ICD-10-CM

## 2020-02-13 DIAGNOSIS — D696 Thrombocytopenia, unspecified: Secondary | ICD-10-CM | POA: Diagnosis not present

## 2020-02-13 DIAGNOSIS — I469 Cardiac arrest, cause unspecified: Secondary | ICD-10-CM | POA: Diagnosis present

## 2020-02-13 DIAGNOSIS — Z20822 Contact with and (suspected) exposure to covid-19: Secondary | ICD-10-CM | POA: Diagnosis present

## 2020-02-13 DIAGNOSIS — R1110 Vomiting, unspecified: Secondary | ICD-10-CM

## 2020-02-13 DIAGNOSIS — N179 Acute kidney failure, unspecified: Secondary | ICD-10-CM

## 2020-02-13 DIAGNOSIS — I110 Hypertensive heart disease with heart failure: Secondary | ICD-10-CM | POA: Diagnosis present

## 2020-02-13 DIAGNOSIS — I82453 Acute embolism and thrombosis of peroneal vein, bilateral: Secondary | ICD-10-CM | POA: Diagnosis present

## 2020-02-13 DIAGNOSIS — Z79899 Other long term (current) drug therapy: Secondary | ICD-10-CM

## 2020-02-13 DIAGNOSIS — I82442 Acute embolism and thrombosis of left tibial vein: Secondary | ICD-10-CM | POA: Diagnosis present

## 2020-02-13 DIAGNOSIS — J811 Chronic pulmonary edema: Secondary | ICD-10-CM | POA: Diagnosis present

## 2020-02-13 DIAGNOSIS — E872 Acidosis: Secondary | ICD-10-CM | POA: Diagnosis present

## 2020-02-13 DIAGNOSIS — D649 Anemia, unspecified: Secondary | ICD-10-CM | POA: Diagnosis not present

## 2020-02-13 DIAGNOSIS — R0602 Shortness of breath: Secondary | ICD-10-CM

## 2020-02-13 HISTORY — DX: Essential (primary) hypertension: I10

## 2020-02-13 LAB — BLOOD GAS, ARTERIAL
Acid-base deficit: 10.5 mmol/L — ABNORMAL HIGH (ref 0.0–2.0)
Acid-base deficit: 15.9 mmol/L — ABNORMAL HIGH (ref 0.0–2.0)
Bicarbonate: 13.2 mmol/L — ABNORMAL LOW (ref 20.0–28.0)
Bicarbonate: 8.2 mmol/L — ABNORMAL LOW (ref 20.0–28.0)
FIO2: 0.4
FIO2: 1
O2 Saturation: 94.7 %
O2 Saturation: 99.7 %
Patient temperature: 37
Patient temperature: 37
pCO2 arterial: 24 mmHg — ABNORMAL LOW (ref 32.0–48.0)
pCO2 arterial: <19 mmHg — CL (ref 32.0–48.0)
pH, Arterial: 7.35 (ref 7.350–7.450)
pH, Arterial: 7.29 — ABNORMAL LOW (ref 7.350–7.450)
pO2, Arterial: 78 mmHg — ABNORMAL LOW (ref 83.0–108.0)
pO2, Arterial: 216 mmHg — ABNORMAL HIGH (ref 83.0–108.0)

## 2020-02-13 LAB — TROPONIN I (HIGH SENSITIVITY): Troponin I (High Sensitivity): 30 ng/L — ABNORMAL HIGH (ref <18)

## 2020-02-13 LAB — RESPIRATORY PANEL BY RT PCR (FLU A&B, COVID)
Influenza A by PCR: NEGATIVE
Influenza B by PCR: NEGATIVE
SARS Coronavirus 2 by RT PCR: NEGATIVE

## 2020-02-13 LAB — COMPREHENSIVE METABOLIC PANEL
ALT: 89 U/L — ABNORMAL HIGH (ref 0–44)
AST: 79 U/L — ABNORMAL HIGH (ref 15–41)
Albumin: 3.6 g/dL (ref 3.5–5.0)
Alkaline Phosphatase: 173 U/L — ABNORMAL HIGH (ref 38–126)
Anion gap: 13 (ref 5–15)
BUN: 42 mg/dL — ABNORMAL HIGH (ref 8–23)
CO2: 20 mmol/L — ABNORMAL LOW (ref 22–32)
Calcium: 9 mg/dL (ref 8.9–10.3)
Chloride: 96 mmol/L — ABNORMAL LOW (ref 98–111)
Creatinine, Ser: 1.9 mg/dL — ABNORMAL HIGH (ref 0.61–1.24)
GFR calc Af Amer: 43 mL/min — ABNORMAL LOW (ref >60)
GFR calc non Af Amer: 37 mL/min — ABNORMAL LOW (ref >60)
Glucose, Bld: 106 mg/dL — ABNORMAL HIGH (ref 70–99)
Potassium: 4.8 mmol/L (ref 3.5–5.1)
Sodium: 129 mmol/L — ABNORMAL LOW (ref 135–145)
Total Bilirubin: 3 mg/dL — ABNORMAL HIGH (ref 0.3–1.2)
Total Protein: 8.4 g/dL — ABNORMAL HIGH (ref 6.5–8.1)

## 2020-02-13 LAB — CBC
HCT: 44.9 % (ref 39.0–52.0)
Hemoglobin: 14.3 g/dL (ref 13.0–17.0)
MCH: 30.2 pg (ref 26.0–34.0)
MCHC: 31.8 g/dL (ref 30.0–36.0)
MCV: 94.7 fL (ref 80.0–100.0)
Platelets: 194 10*3/uL (ref 150–400)
RBC: 4.74 MIL/uL (ref 4.22–5.81)
RDW: 14.6 % (ref 11.5–15.5)
WBC: 9.2 10*3/uL (ref 4.0–10.5)
nRBC: 0.2 % (ref 0.0–0.2)

## 2020-02-13 LAB — MAGNESIUM: Magnesium: 2.2 mg/dL (ref 1.7–2.4)

## 2020-02-13 LAB — BRAIN NATRIURETIC PEPTIDE: B Natriuretic Peptide: 903 pg/mL — ABNORMAL HIGH (ref 0.0–100.0)

## 2020-02-13 LAB — ETHANOL: Alcohol, Ethyl (B): <10 mg/dL (ref <10)

## 2020-02-13 MED ORDER — METOPROLOL TARTRATE 5 MG/5ML IV SOLN
5.0000 mg | Freq: Once | INTRAVENOUS | Status: AC
  Administered 2020-02-13: 16:00:00 5 mg via INTRAVENOUS
  Filled 2020-02-13: qty 5

## 2020-02-13 MED ORDER — DILTIAZEM LOAD VIA INFUSION
10.0000 mg | Freq: Once | INTRAVENOUS | Status: AC
  Administered 2020-02-13: 18:00:00 10 mg via INTRAVENOUS
  Filled 2020-02-13: qty 10

## 2020-02-13 MED ORDER — DILTIAZEM HCL-DEXTROSE 125-5 MG/125ML-% IV SOLN (PREMIX): 5.0000 mg/h | INTRAVENOUS | Status: DC

## 2020-02-13 MED ORDER — AMIODARONE HCL IN DEXTROSE 360-4.14 MG/200ML-% IV SOLN
60.0000 mg/h | INTRAVENOUS | Status: AC
  Filled 2020-02-13: qty 200

## 2020-02-13 MED ORDER — VASOPRESSIN 20 UNIT/ML IV SOLN
0.0300 [IU]/min | INTRAVENOUS | Status: DC
  Filled 2020-02-13: qty 2

## 2020-02-13 MED ORDER — DILTIAZEM LOAD VIA INFUSION
10.0000 mg | Freq: Once | INTRAVENOUS | Status: DC
  Filled 2020-02-13: qty 10

## 2020-02-13 MED ORDER — SODIUM CHLORIDE 0.9 % IV SOLN: 1.0000 g | Freq: Once | INTRAVENOUS | Status: DC

## 2020-02-13 MED ORDER — SODIUM BICARBONATE 8.4 % IV SOLN
100.0000 meq | Freq: Once | INTRAVENOUS | Status: AC
  Administered 2020-02-13: 21:00:00 100 meq via INTRAVENOUS
  Filled 2020-02-13: qty 50

## 2020-02-13 MED ORDER — FUROSEMIDE 10 MG/ML IJ SOLN
40.0000 mg | Freq: Once | INTRAMUSCULAR | Status: AC
  Administered 2020-02-13: 16:00:00 40 mg via INTRAVENOUS
  Filled 2020-02-13: qty 4

## 2020-02-13 MED ORDER — SODIUM CHLORIDE 0.9 % IV BOLUS
500.0000 mL | Freq: Once | INTRAVENOUS | Status: AC
  Administered 2020-02-13: 17:00:00 500 mL via INTRAVENOUS

## 2020-02-13 MED ORDER — FENTANYL 2500MCG IN NS 250ML (10MCG/ML) PREMIX INFUSION
0.0000 ug/h | INTRAVENOUS | Status: DC
  Administered 2020-02-14: 250 ug/h via INTRAVENOUS
  Administered 2020-02-14: 100 ug/h via INTRAVENOUS
  Administered 2020-02-15: 150 ug/h via INTRAVENOUS
  Administered 2020-02-15: 300 ug/h via INTRAVENOUS
  Administered 2020-02-16: 175 ug/h via INTRAVENOUS
  Administered 2020-02-16: 200 ug/h via INTRAVENOUS
  Administered 2020-02-17: 250 ug/h via INTRAVENOUS
  Administered 2020-02-17 (×2): 325 ug/h via INTRAVENOUS
  Administered 2020-02-18: 350 ug/h via INTRAVENOUS
  Administered 2020-02-18: 325 ug/h via INTRAVENOUS
  Administered 2020-02-18 (×2): 300 ug/h via INTRAVENOUS
  Administered 2020-02-19 (×2): 325 ug/h via INTRAVENOUS
  Administered 2020-02-20: 300 ug/h via INTRAVENOUS
  Administered 2020-02-20 (×2): 325 ug/h via INTRAVENOUS
  Administered 2020-02-21: 300 ug/h via INTRAVENOUS
  Filled 2020-02-13 (×18): qty 250

## 2020-02-13 MED ORDER — SODIUM CHLORIDE 0.9 % IV SOLN
1.0000 g | Freq: Once | INTRAVENOUS | Status: AC
  Administered 2020-02-13: 21:00:00 1 g via INTRAVENOUS
  Filled 2020-02-13: qty 10

## 2020-02-13 MED ORDER — NOREPINEPHRINE 4 MG/250ML-% IV SOLN: 0.0000 ug/min | INTRAVENOUS | Status: DC

## 2020-02-13 MED ORDER — STERILE WATER FOR INJECTION IV SOLN
INTRAVENOUS | Status: DC
  Filled 2020-02-13 (×2): qty 850

## 2020-02-13 MED ORDER — PHENYLEPHRINE CONCENTRATED 100MG/250ML (0.4 MG/ML) INFUSION SIMPLE
0.0000 ug/min | INTRAVENOUS | Status: DC
  Filled 2020-02-13: qty 250

## 2020-02-13 MED ORDER — DILTIAZEM HCL-DEXTROSE 125-5 MG/125ML-% IV SOLN (PREMIX)
5.0000 mg/h | INTRAVENOUS | Status: DC
  Administered 2020-02-13: 18:00:00 5 mg/h via INTRAVENOUS
  Filled 2020-02-13: qty 125

## 2020-02-13 MED ORDER — FENTANYL 2500MCG IN NS 250ML (10MCG/ML) PREMIX INFUSION: 0.0000 ug/h | INTRAVENOUS | Status: DC

## 2020-02-13 MED ORDER — ACETAMINOPHEN 325 MG PO TABS
650.0000 mg | ORAL_TABLET | Freq: Four times a day (QID) | ORAL | Status: DC | PRN
  Administered 2020-02-22: 650 mg via ORAL
  Filled 2020-02-13: qty 2

## 2020-02-13 MED ORDER — AMIODARONE HCL IN DEXTROSE 360-4.14 MG/200ML-% IV SOLN
30.0000 mg/h | INTRAVENOUS | Status: DC
  Administered 2020-02-14 – 2020-02-16 (×5): 30 mg/h via INTRAVENOUS
  Filled 2020-02-13 (×5): qty 200

## 2020-02-13 MED ORDER — AMIODARONE LOAD VIA INFUSION
150.0000 mg | Freq: Once | INTRAVENOUS | Status: AC
  Administered 2020-02-13: 23:00:00 150 mg via INTRAVENOUS
  Filled 2020-02-13: qty 83.34

## 2020-02-13 MED ORDER — FUROSEMIDE 10 MG/ML IJ SOLN
20.0000 mg | Freq: Two times a day (BID) | INTRAMUSCULAR | Status: DC
  Administered 2020-02-13: 21:00:00 20 mg via INTRAVENOUS

## 2020-02-13 MED ORDER — ACETAMINOPHEN 650 MG RE SUPP: 650.0000 mg | Freq: Four times a day (QID) | RECTAL | Status: DC | PRN

## 2020-02-13 MED ORDER — ENOXAPARIN SODIUM 100 MG/ML ~~LOC~~ SOLN
1.0000 mg/kg | Freq: Two times a day (BID) | SUBCUTANEOUS | Status: DC
  Administered 2020-02-13 – 2020-02-14 (×2): 90 mg via SUBCUTANEOUS
  Filled 2020-02-13 (×3): qty 1

## 2020-02-13 MED ORDER — ADULT MULTIVITAMIN W/MINERALS CH
1.0000 | ORAL_TABLET | Freq: Every day | ORAL | Status: DC
  Administered 2020-02-14 – 2020-02-15 (×2): 1 via ORAL
  Filled 2020-02-13 (×2): qty 1

## 2020-02-13 MED ORDER — CALCIUM GLUCONATE-NACL 1-0.675 GM/50ML-% IV SOLN
1.0000 g | Freq: Once | INTRAVENOUS | Status: DC
  Filled 2020-02-13: qty 50

## 2020-02-13 NOTE — ED Provider Notes (Addendum)
IO LINE INSERTION  Date/Time: 02/13/2020 11:20 PM Performed by: Loleta Rose, MD Authorized by: Loleta Rose, MD   Consent:    Consent obtained:  Emergent situation Procedure details:    Insertion site:  L proximal tibia   Insertion device:  Drill device   Insertion: Needle was inserted through the bony cortex     Number of attempts:  1   Insertion confirmation:  Aspiration of blood/marrow and stability of the needle (aspiration of marrow and response to pain with NS flush) Post-procedure details:    Secured with:  Protective shield IO LINE INSERTION  Date/Time: 02/13/2020 11:31 PM Performed by: Loleta Rose, MD Authorized by: Loleta Rose, MD   Consent:    Consent obtained:  Emergent situation Procedure details:    Insertion site:  R proximal tibia   Insertion device:  Drill device   Insertion: Needle was inserted through the bony cortex     Number of attempts:  1   Insertion confirmation:  Aspiration of blood/marrow (aspiration of marrow and response to pain when flushing with NS) Post-procedure details:    Secured with:  Protective shield      Loleta Rose, MD 02/13/20 2332    Loleta Rose, MD 02/13/20 2332

## 2020-02-13 NOTE — Consult Note (Signed)
Name: Shawn Taylor MRN: [id number] DOB: 1955-12-02    ADMISSION DATE:  02/13/2020 CONSULTATION DATE: 02/13/2020  REFERRING MD : Rufina Falco, NP   CHIEF COMPLAINT: Cardiac Arrest   BRIEF PATIENT DESCRIPTION:  64 yo male admitted s/p respiratory arrest followed by cardiac arrest, cardiogenic shock, pulmonary edema, severe metabolic acidosis, BLE DVT's concerning for possible pulmonary embolisms requiring mechanical intubation and levophed gtt  SIGNIFICANT EVENTS/STUDIES:  03/22: Pt admitted to the stepdown unit due to atrial flutter with rvr, acute hypoxic respiratory failure secondary to severe metabolic acidosis and pulmonary edema  03/22: Venous US Bilateral Extremity revealed occlusive thrombus within the right popliteal vein, posterior tibial vein, and peroneal veins. Occlusive thrombus within the left popliteal vein and left anterior peroneal vein. 03/22: While awaiting bed availability in the stepdown unit pt respiratory arrested which led to cardiac arrest ROSC obtained, pt mechanically intubation, and admitted to ICU   HISTORY OF PRESENT ILLNESS:   This is a 64 yo male with a PMH of HTN, Cocaine/ETOH abuse, and Migraines.  He presented to [facility] ER on 03/22 from home via EMS with c/o progressively worsening shortness of breath, cough, and bilateral lower extremity edema onset of symptoms 3 weeks prior to presentation.  EMS reported pts initial cardiac rhythm atrial flutter with rvr, hr 140 bpm.  Therefore, EMS administered 5 mg of metoprolol en route to the ER, however pts heart rate did not improve.  ER EKG findings were atrial flutter with 2:1 AV conduction, slight ST depression in anterior leads, and no overt ST elevation.  Lab results revealed Na+ 129, chloride 96, CO2 20, glucose 106, BUN 42, creatinine 1.90, alcohol level <10, alk phos 173, AST 79, ALT 89, total bilirubin 3.0, BNP 903, troponin 30, Influenza PCR/COVID-19 negative, and abg pH 7.35/pCO2 24/pO2 78/acid-base deficit  10.5/bicarb 13.2. CXR concerning for pulmonary edema.  Bilateral lower extremity venous ultrasound revealed bilateral DVT's. Pt received 90 mg subq lovenox; 500 ml fluid bolus; 5 mg of iv metoprolol; 40 mg iv lasix; and 1g of calcium gluconate. Pt later required cardizem gtt due to continued atrial flutter vs atrial fibrillation with rvr.  Cardiologist Dr. Nehemiah Massed contacted by ER physician.  Pt subsequently admitted to the stepdown unit per hospitalist team for additional workup and treatment. However, while awaiting stepdown unit bed availibility in the ER pts respiratory status continued to deteriorate, repeat abg pH 7.29/pCO2 <19/pO2 216/acid-base deficit 15.9/bicarb 8.2.  He received 2 amps of sodium bicarb and sodium bicarb gtt initiated at 75 ml/hr.  However, he subsequently respiratory arrested followed by cardiac arrest (asystole). CPR/ACLS protocol initiated and pt developed multiple cardiac arrhythmias during code with ROSC 12 minutes later (see code sheet for details).  Post cardiac arrest pt noted to have purposeful movement but required mechanically intubation and transfer to ICU.  PCCM team contacted to assume care.    PAST MEDICAL HISTORY :   has a past medical history of Hypertension and Migraines.  has no past surgical history on file. Prior to Admission medications   Medication Sig Start Date End Date Taking? Authorizing Provider  albuterol (PROVENTIL HFA;VENTOLIN HFA) 108 (90 Base) MCG/ACT inhaler Inhale 2 puffs into the lungs every 6 (six) hours as needed for wheezing or shortness of breath. Patient not taking: Reported on 02/13/2020 11/23/17   Nance Pear, MD  traMADol (ULTRAM) 50 MG tablet Take 1 tablet (50 mg total) by mouth every 6 (six) hours as needed. Patient not taking: Reported on 02/13/2020 04/06/18   Ashok Cordia  W, PA-C   No Known Allergies  FAMILY HISTORY:  family history is not on file. SOCIAL HISTORY:  reports that he has been smoking cigarettes. He has been  smoking about 0.50 packs per day. He has never used smokeless tobacco. He reports current alcohol use of about 2.0 - 3.0 standard drinks of alcohol per week. He reports current drug use. Frequency: 1.00 time per week. Drug: Cocaine.  REVIEW OF SYSTEMS:   Unable to assess pt intubated   SUBJECTIVE:  Unable to assess pt intubated   VITAL SIGNS: Temp:  [96.9 F (36.1 C)] 96.9 F (36.1 C) (03/22 1457) Pulse Rate:  [55-195] 55 (03/22 2311) Resp:  [17-30] 17 (03/22 2311) BP: (76-195)/(53-150) 103/53 (03/22 2300) SpO2:  [89 %-100 %] 98 % (03/22 2311) Weight:  [90.7 kg] 90.7 kg (03/22 1459)  PHYSICAL EXAMINATION: General: acutely ill appearing male, NAD mechanically intubated  Neuro: sedated, not following commands, PERRL  HEENT: JVD present Cardiovascular: sinus rhythm, no R/G  Lungs: rhonchi with expiratory wheezes throughout, even, non labored Abdomen: +BS x4, soft, obese, non distended  Musculoskeletal: 2+ bilateral pitting edema  Skin: intact no rashes or lesions   Recent Labs  Lab 02/13/20 1503  NA 129*  K 4.8  CL 96*  CO2 20*  BUN 42*  CREATININE 1.90*  GLUCOSE 106*   Recent Labs  Lab 02/13/20 1503  HGB 14.3  HCT 44.9  WBC 9.2  PLT 194   DG Chest 2 View  Result Date: 02/13/2020 CLINICAL DATA:  Progressive shortness of breath over last 3 weeks, atrial flutter EXAM: CHEST - 2 VIEW COMPARISON:  04/06/2018 FINDINGS: Frontal and lateral views of the chest demonstrate an enlarged cardiac silhouette. Stable atherosclerosis of the aortic arch. There is diffuse ground-glass airspace disease throughout the lungs. No effusion or pneumothorax. Lungs are hyperinflated. No acute bony abnormalities. IMPRESSION: 1. Diffuse ground-glass airspace disease most compatible with edema given clinical presentation. 2. Cardiomegaly. 3. Atherosclerosis. Electronically Signed   By: Randa Ngo M.D.   On: 02/13/2020 15:41   US Venous Img Lower Bilateral  Result Date: 02/13/2020 CLINICAL  DATA:  Bilateral lower extremity pain and swelling for several months EXAM: BILATERAL LOWER EXTREMITY VENOUS DOPPLER ULTRASOUND TECHNIQUE: Gray-scale sonography with graded compression, as well as color Doppler and duplex ultrasound were performed to evaluate the lower extremity deep venous systems from the level of the common femoral vein and including the common femoral, femoral, profunda femoral, popliteal and calf veins including the posterior tibial, peroneal and gastrocnemius veins when visible. The superficial great saphenous vein was also interrogated. Spectral Doppler was utilized to evaluate flow at rest and with distal augmentation maneuvers in the common femoral, femoral and popliteal veins. COMPARISON:  None. FINDINGS: RIGHT LOWER EXTREMITY Common Femoral Vein: No evidence of thrombus. Normal compressibility, respiratory phasicity and response to augmentation. Saphenofemoral Junction: No evidence of thrombus. Normal compressibility and flow on color Doppler imaging. Profunda Femoral Vein: No evidence of thrombus. Normal compressibility and flow on color Doppler imaging. Femoral Vein: No evidence of thrombus. Normal compressibility, respiratory phasicity and response to augmentation. Popliteal Vein: Occlusive thrombus throughout the right popliteal vein. Calf Veins: Occlusive thrombus within the right posterior tibial and peroneal veins. Superficial Great Saphenous Vein: No evidence of thrombus. Normal compressibility. Other Findings:  None. LEFT LOWER EXTREMITY Common Femoral Vein: No evidence of thrombus. Normal compressibility, respiratory phasicity and response to augmentation. Saphenofemoral Junction: No evidence of thrombus. Normal compressibility and flow on color Doppler imaging. Profunda Femoral Vein: No  evidence of thrombus. Normal compressibility and flow on color Doppler imaging. Femoral Vein: No evidence of thrombus. Normal compressibility, respiratory phasicity and response to augmentation.  Popliteal Vein: Occlusive thrombus within the left popliteal vein. Calf Veins: There is occlusive thrombus within the left anterior peroneal vein. Superficial Great Saphenous Vein: No evidence of thrombus. Normal compressibility. Other Findings:  None. IMPRESSION: 1. Occlusive thrombus within the right popliteal vein, posterior tibial vein, and peroneal veins. 2. Occlusive thrombus within the left popliteal vein and left anterior peroneal vein. Electronically Signed   By: Randa Ngo M.D.   On: 02/13/2020 21:41    ASSESSMENT / PLAN:  Acute hypoxic hypercapnic respiratory failure secondary to pulmonary edema, severe metabolic acidosis, and possible PE's  Mechanical Intubation  Hx: current everyday smoker  Full vent support for now-vent settings reviewed and established SBT once all parameters met  VAP bundle implemented  IV and nebulized steroids  Hypothermic protocol NOT initiated post cardiac arrest it was reported pt had purposeful movement   Cardiac Arrest (asystole) likely secondary to respiratory arrest  Cardiogenic shock  New onset atrial flutter vs. atrial fibrillation with rvr  Bilateral lower extremity DVT's and possible PE's  Hx: HTN and Cocaine Abuse Continuous telemetry monitoring  Trend troponin's and coags  Continue amiodarone gtt; no beta blocker's due to cocaine abuse hx Echo pending  Cardiology consulted appreciate input  Prn levophed gtt to maintain map >65 Will r/o sepsis  Unable to diurese at this time due to hypotension Once renal function stabilizes will need CTA Chest to r/o PE  Will NOT start heparin gtt at this time due to acute penile bleeding following indwelling foley catheter insertion attempt   Acute renal failure with severe metabolic acidosis  Hyponatremia  Trend BMP and ABG Replace electrolytes as indicated  Monitor UOP Avoid nephrotoxic medications Prn bladder scan  Sodium bicarb gtt @100  ml/hr, if acidosis does not improve will consult  nephrology for possible CRRT   Hypoglycemia  CBG's q2hrs for now Follow hypothermic protocol   Acute metabolic encephalopathy Mechanical ventilation pain/discomfort Hx: ETOH/Cocaine Abuse Maintain RASS goal -1 to -2 for now  Prn fentanyl gtt to maintain RASS goal  Continue CIWA protocol-prn ativan per protocol  Continue mvi, folic acid, and thiamine WUA daily  Once extubated pt will need ETOH and polysubstance abuse cessation counseling  Best Practice: VTE px: subq lovenox  SUP px: iv pepcid  Diet: keep NPO for now   -Attempted to contact emergency contacts listed in epic: pts niece Ty Hancher followed by pts aunt Creg Gilmer, however both phone calls went to voicemail.  Therefore, I left a voicemail message instructing first emergency contact pts niece Stedman [place] to return my phone call. Pts prognosis currently guarded   Marda Stalker, [place] Pager [phone] (please enter 7 digits) PCCM Consult Pager [phone] (please enter 7 digits)

## 2020-02-13 NOTE — ED Notes (Addendum)
Unable to obtain a good reading for O2 sats with any monitoring. Pt continues to have intermittent periods of intense SOB with cardiac rhythm changes. Pt on diltiazem. Hospitalist contacted to inquire about bipap. When sats will read, they are low 70s to mid 80s.

## 2020-02-13 NOTE — ED Notes (Signed)
Iv team in with pt 

## 2020-02-13 NOTE — ED Provider Notes (Addendum)
[facility] Emergency Department Provider Note  ____________________________________________   First MD Initiated Contact with Patient 02/13/20 1503     (approximate)  I have reviewed the triage vital signs and the nursing notes.   HISTORY  Chief Complaint Shortness of Breath and Atrial Fibrillation    HPI [name] is a 64 y.o. male  With h/o HTN here with cough, SOB. Pt states that over the last 3-4 weeks he's had progressively worsening cough, SOB. He's had increasing LE edema with this along with weakness, DOE. He has had some mild chest pressure with this occasionally, though none present. He has noticed increased LE edema. No numbness, weakness. SOB is worse lying flat and with exertion. No CP. No fever, chills. No known COVID exposures. No specific alleviating factors.     Past Medical History:  Diagnosis Date  . Hypertension   . Migraines     There are no problems to display for this patient.   History reviewed. No pertinent surgical history.  Prior to Admission medications   Medication Sig Start Date End Date Taking? Authorizing Provider  albuterol (PROVENTIL HFA;VENTOLIN HFA) 108 (90 Base) MCG/ACT inhaler Inhale 2 puffs into the lungs every 6 (six) hours as needed for wheezing or shortness of breath. 11/23/17   Phineas Semen, MD  predniSONE (DELTASONE) 10 MG tablet Take 4 tablets (40 mg total) by mouth daily with breakfast. 04/06/18   Sherrie Mustache Roselyn Bering, PA-C  traMADol (ULTRAM) 50 MG tablet Take 1 tablet (50 mg total) by mouth every 6 (six) hours as needed. 04/06/18   Faythe Ghee, PA-C    Allergies Patient has no known allergies.  History reviewed. No pertinent family history.  Social History Social History   Tobacco Use  . Smoking status: Current Every Day Smoker    Packs/day: 0.50    Types: Cigarettes  . Smokeless tobacco: Never Used  Substance Use Topics  . Alcohol use: Yes    Alcohol/week: 2.0 - 3.0 standard drinks    Types: 2 - 3 Cans of beer per week  . Drug use: Yes    Frequency: 1.0 times per week    Types: Cocaine    Review of Systems  Review of Systems  Constitutional: Positive for fatigue. Negative for chills and fever.  HENT: Negative for sore throat.   Respiratory: Positive for cough and shortness of breath.   Cardiovascular: Positive for palpitations and leg swelling. Negative for chest pain.  Gastrointestinal: Negative for abdominal pain.  Genitourinary: Negative for flank pain.  Musculoskeletal: Negative for neck pain.  Skin: Negative for rash and wound.  Allergic/Immunologic: Negative for immunocompromised state.  Neurological: Positive for weakness. Negative for numbness.  Hematological: Does not bruise/bleed easily.  All other systems reviewed and are negative.    ____________________________________________  PHYSICAL EXAM:      VITAL SIGNS: ED Triage Vitals  Enc Vitals Group     BP 02/13/20 1457 (!) 170/140     Pulse Rate 02/13/20 1457 (!) 195     Resp 02/13/20 1457 (!) 24     Temp 02/13/20 1457 (!) 96.9 F (36.1 C)     Temp Source 02/13/20 1457 Axillary     SpO2 02/13/20 1457 100 %     Weight 02/13/20 1459 200 lb (90.7 kg)     Height 02/13/20 1459 5\' 11"  (1.803 m)     Head Circumference --      Peak Flow --      Pain Score 02/13/20 1458  0     Pain Loc --      Pain Edu? --      Excl. in GC? --      Physical Exam Vitals and nursing note reviewed.  Constitutional:      General: He is not in acute distress.    Appearance: He is well-developed.  HENT:     Head: Normocephalic and atraumatic.  Eyes:     Conjunctiva/sclera: Conjunctivae normal.  Cardiovascular:     Rate and Rhythm: Normal rate and regular rhythm.     Heart sounds: Normal heart sounds.  Pulmonary:     Effort: Pulmonary effort is normal. No respiratory distress.     Breath sounds: Examination of the right-middle field reveals rales. Examination of the left-middle field reveals rales. Examination  of the right-lower field reveals rales. Examination of the left-lower field reveals rales. Rales present. No wheezing.  Abdominal:     General: There is no distension.  Musculoskeletal:     Cervical back: Neck supple.     Right lower leg: Edema (3+ pitting) present.     Left lower leg: Edema (3+ pitting) present.  Skin:    General: Skin is warm.     Capillary Refill: Capillary refill takes less than 2 seconds.     Findings: No rash.  Neurological:     Mental Status: He is alert and oriented to person, place, and time.     Motor: No abnormal muscle tone.       ____________________________________________   LABS (all labs ordered are listed, but only abnormal results are displayed)  Labs Reviewed  COMPREHENSIVE METABOLIC PANEL - Abnormal; Notable for the following components:      Result Value   Sodium 129 (*)    Chloride 96 (*)    CO2 20 (*)    Glucose, Bld 106 (*)    BUN 42 (*)    Creatinine, Ser 1.90 (*)    Total Protein 8.4 (*)    AST 79 (*)    ALT 89 (*)    Alkaline Phosphatase 173 (*)    Total Bilirubin 3.0 (*)    GFR calc non Af Amer 37 (*)    GFR calc Af Amer 43 (*)    All other components within normal limits  BRAIN NATRIURETIC PEPTIDE - Abnormal; Notable for the following components:   B Natriuretic Peptide 903.0 (*)    All other components within normal limits  TROPONIN I (HIGH SENSITIVITY) - Abnormal; Notable for the following components:   Troponin I (High Sensitivity) 30 (*)    All other components within normal limits  CBC    ____________________________________________  EKG: Atrial Flutter with 2:1 AV conduction. Slight ST depression in anteriro leads, likely rate-related. No overt ST elevations. ________________________________________  RADIOLOGY All imaging, including plain films, CT scans, and ultrasounds, independently reviewed by me, and interpretations confirmed via formal radiology reads.  ED MD interpretation:   CXR: Diffuse edema,  cardiomegaly  Official radiology report(s): DG Chest 2 View  Result Date: 02/13/2020 CLINICAL DATA:  Progressive shortness of breath over last 3 weeks, atrial flutter EXAM: CHEST - 2 VIEW COMPARISON:  04/06/2018 FINDINGS: Frontal and lateral views of the chest demonstrate an enlarged cardiac silhouette. Stable atherosclerosis of the aortic arch. There is diffuse ground-glass airspace disease throughout the lungs. No effusion or pneumothorax. Lungs are hyperinflated. No acute bony abnormalities. IMPRESSION: 1. Diffuse ground-glass airspace disease most compatible with edema given clinical presentation. 2. Cardiomegaly. 3. Atherosclerosis. Electronically Signed  By: Randa Ngo M.D.   On: 02/13/2020 15:41    ____________________________________________  PROCEDURES   Procedure(s) performed (including Critical Care):  .Critical Care Performed by: Duffy Bruce, MD Authorized by: Duffy Bruce, MD   Critical care provider statement:    Critical care time (minutes):  35   Critical care time was exclusive of:  Separately billable procedures and treating other patients and teaching time   Critical care was necessary to treat or prevent imminent or life-threatening deterioration of the following conditions:  Cardiac failure, circulatory failure and respiratory failure   Critical care was time spent personally by me on the following activities:  Development of treatment plan with patient or surrogate, discussions with consultants, evaluation of patient's response to treatment, examination of patient, obtaining history from patient or surrogate, ordering and performing treatments and interventions, ordering and review of laboratory studies, ordering and review of radiographic studies, pulse oximetry, re-evaluation of patient's condition and review of old charts   I assumed direction of critical care for this patient from another provider in my specialty: no   .1-3 Lead EKG  Interpretation Performed by: Duffy Bruce, MD Authorized by: Duffy Bruce, MD     Interpretation: abnormal     ECG rate:  140-180   ECG rate assessment: tachycardic     Rhythm: atrial flutter     Ectopy: none     Conduction: normal   Comments:     Indication: New onset CHF and AFlutter with 2:1 conduction    ____________________________________________  INITIAL IMPRESSION / MDM / ASSESSMENT AND PLAN / ED COURSE  As part of my medical decision making, I reviewed the following data within the [place] notes reviewed and incorporated, Old chart reviewed, Notes from prior ED visits, and [place] Controlled Substance Database       *Shawn Taylor was evaluated in Emergency Department on 02/13/2020 for the symptoms described in the history of present illness. He was evaluated in the context of the global COVID-19 pandemic, which necessitated consideration that the patient might be at risk for infection with the SARS-CoV-2 virus that causes COVID-19. Institutional protocols and algorithms that pertain to the evaluation of patients at risk for COVID-19 are in a state of rapid change based on information released by regulatory bodies including the CDC and federal and state organizations. These policies and algorithms were followed during the patient's care in the ED.  Some ED evaluations and interventions may be delayed as a result of limited staffing during the pandemic.*    CHA2Ds2-VASc Score for Atrial Fibrillation    Patient Score  Age <65 = 0 65-74 = 1 > 75 = 2 0  Sex Male = 0 Male = 1 0  CHF History No = 0  Yes = 1 0  HTN History No = 0  Yes = 1 1  Stroke/TIA/TE History No = 0  Yes = 1 0  Vascular Disease History No = 0  Yes = 1 0  Diabetes History No = 0  Yes = 1 0  Total:  1   0.6 % stroke rate/year from a score of 1   Medical Decision Making:  64 yo M here with new onset CHF and AFlutter with variable (predominately 2:1 conduction). Pt with  3+ pitting edema, bl rales and BNP >900 with CXR c/f edema. CMP likely with possible congestive hepatopathy and renal dysfunction, which is also likely contributing to his volume overload. No fever, WBC normal, no signs of sepsis  or infectious etiology at this time. Will trial lasix, IV lopressor, admit. CHADS2-VASC calculated at 1, though suspect he likely has some underlying CHF as well as diabetes.  Transient hypotension noted after lopressor, improved with time. No CP or ischemic sx. Returned to sinus tachycardia vs AFib thereafter. Dr. Gwen Pounds of Cardiology consulted. Will admit, be cautious with additional medications at this time. Hospitalist re-evaluated, recommended starting dil bolus and drip for return of Aflutter. BP elevated at this time. They will follow closely. ____________________________________________  FINAL CLINICAL IMPRESSION(S) / ED DIAGNOSES  Final diagnoses:  Atypical atrial flutter (HCC)  Acute on chronic congestive heart failure, unspecified heart failure type (HCC)     MEDICATIONS GIVEN DURING THIS VISIT:  Medications  metoprolol tartrate (LOPRESSOR) injection 5 mg (has no administration in time range)  furosemide (LASIX) injection 40 mg (has no administration in time range)     ED Discharge Orders    None       Note:  This document was prepared using Dragon voice recognition software and may include unintentional dictation errors.   Shaune Pollack, MD 02/13/20 1548    Shaune Pollack, MD 02/13/20 [place]    Shaune Pollack, MD 02/14/20 0200

## 2020-02-13 NOTE — ED Notes (Signed)
Pt c/o unable to breathe. Bladder scan performed and showed . Pt c/o unable to breathe as before. Pt pulling NRB off of his face. Pt encouraged to wear oxygen. Pt gets wide eyed and eyes roll back in his head. Pt then stops breathing. Code called

## 2020-02-13 NOTE — ED Provider Notes (Signed)
[facility] Department of Emergency Medicine   Code Blue CONSULT NOTE  Chief Complaint: Cardiac arrest/unresponsive   Level V Caveat: Unresponsive  History of present illness: I was contacted by the hospital for a CODE BLUE cardiac arrest and presented to the patient's bedside.   Patient had been previously seen and admitted in ED for new onset AFib vs Flutter with respiratory failure.   ROS: Unable to obtain, Level V caveat  Scheduled Meds: . enoxaparin (LOVENOX) injection  1 mg/kg Subcutaneous Q12H  . furosemide  20 mg Intravenous Q12H  . multivitamin with minerals  1 tablet Oral Daily   Continuous Infusions: . amiodarone     Followed by  . [START ON 02/14/2020] amiodarone    . diltiazem (CARDIZEM) infusion Stopped (02/13/20 2300)  .  sodium bicarbonate (isotonic) infusion in sterile water Stopped (02/13/20 2327)   PRN Meds:.acetaminophen **OR** acetaminophen Past Medical History:  Diagnosis Date  . Hypertension   . Migraines    History reviewed. No pertinent surgical history. Social History   Socioeconomic History  . Marital status: Single    Spouse name: Not on file  . Number of children: Not on file  . Years of education: Not on file  . Highest education level: Not on file  Occupational History  . Not on file  Tobacco Use  . Smoking status: Current Every Day Smoker    Packs/day: 0.50    Types: Cigarettes  . Smokeless tobacco: Never Used  Substance and Sexual Activity  . Alcohol use: Yes    Alcohol/week: 2.0 - 3.0 standard drinks    Types: 2 - 3 Cans of beer per week  . Drug use: Yes    Frequency: 1.0 times per week    Types: Cocaine  . Sexual activity: Not on file  Other Topics Concern  . Not on file  Social History Narrative  . Not on file   Social Determinants of Health   Financial Resource Strain:   . Difficulty of Paying Living Expenses:   Food Insecurity:   . Worried About Programme researcher, broadcasting/film/video in the Last Year:   . Occupational psychologist in the Last Year:   Transportation Needs:   . Freight forwarder (Medical):   Marland Kitchen Lack of Transportation (Non-Medical):   Physical Activity:   . Days of Exercise per Week:   . Minutes of Exercise per Session:   Stress:   . Feeling of Stress :   Social Connections:   . Frequency of Communication with Friends and Family:   . Frequency of Social Gatherings with Friends and Family:   . Attends Religious Services:   . Active Member of Clubs or Organizations:   . Attends Banker Meetings:   Marland Kitchen Marital Status:   Intimate Partner Violence:   . Fear of Current or Ex-Partner:   . Emotionally Abused:   Marland Kitchen Physically Abused:   . Sexually Abused:    No Known Allergies  Last set of Vital Signs (not current) Vitals:   02/13/20 2310 02/13/20 2311  BP:    Pulse:  (!) 55  Resp: (!) 30 17  Temp:    SpO2:  98%      Physical Exam Gen: unresponsive Cardiovascular: pulseless  Resp: apneic. Breath sounds equal bilaterally with bagging  Abd: nondistended  Neuro: GCS 3, unresponsive to pain  HEENT: No blood in posterior pharynx, frothing edema noted in mouth  Neck: No crepitus  Musculoskeletal: No deformity  Skin:  warm  Procedures (when applicable, including Critical Care time): Procedure Name: Intubation Date/Time: 02/14/2020 1:50 AM Performed by: Duffy Bruce, MD Pre-anesthesia Checklist: Patient identified, Patient being monitored, Emergency Drugs available, Timeout performed and Suction available Oxygen Delivery Method: Ambu bag Preoxygenation: Pre-oxygenation with 100% oxygen Induction Type: Rapid sequence Ventilation: Mask ventilation without difficulty Laryngoscope Size: Glidescope and 4 Grade View: Grade I Tube size: 7.5 mm Number of attempts: 1 Airway Equipment and Method: Rigid stylet Placement Confirmation: ETT inserted through vocal cords under direct vision,  CO2 detector and Breath sounds checked- equal and bilateral Secured at: 24 cm Tube  secured with: ETT holder Dental Injury: Teeth and Oropharynx as per pre-operative assessment  Difficulty Due To: Difficulty was unanticipated Future Recommendations: Recommend- induction with short-acting agent, and alternative techniques readily available    CPR  Date/Time: 02/14/2020 1:50 AM Performed by: Duffy Bruce, MD Authorized by: Duffy Bruce, MD  CPR Procedure Details:      Amount of time prior to administration of ACLS/BLS (minutes):  0   CPR/ACLS performed in the ED: Yes     Duration of CPR (minutes):  15   Outcome: ROSC obtained    CPR performed via ACLS guidelines under my direct supervision.  See RN documentation for details including defibrillator use, medications, doses and timing.   IV Placement: Due to multiple failed IV attempts, left EJ placed by myself at bedside using sterile technique. Tolerated well with good blood return.   MDM / Assessment and Plan Called to bedside due to loss of pulses. CPR with bagging in progress on my arrival to room. Shortly thereafter, pt noted to have ROSC to apparent PEA after epi x 1 and CPR, then lost pulses again. Briefly went into a bradycardic apparent complete block, followed by loss of pulses and CPR x 1 again with epi. ROSC achieved with apparent sinus tachycardia, though brief Vtach episode noted. Pt profoundly hypotensive and hypoxic initially, slowly improving with intubation. Taken to ICU.  Suspect cardiac arrest 2/2 arrhythmia, likely in setting of CHF vs possible PE. Pt stable on levophed, intubated at time of transfer and ICU team made aware.    Duffy Bruce, MD 02/14/20 [phone]

## 2020-02-13 NOTE — ED Notes (Signed)
Resp paged and [place]. Pt needs to be placed on bipap due to increased work of breathing. IV team consult entered to obtain a site again.

## 2020-02-13 NOTE — ED Notes (Signed)
Pt pulled out his iv and pulled off his external catheter. Pt sitting on commode and states he cant breathe. Pt is asking to finish using the restroom. Pt placed back in bed and given instruction to ring the call bell.

## 2020-02-13 NOTE — H&P (Signed)
History and Physical    Shawn Taylor WNI:[id number] DOB: 10-Nov-1956 DOA: 02/13/2020  PCP: Shawn Taylor  Patient coming from: Home   Chief Complaint: Shortness of breath  HPI: Shawn Taylor is a 64 y.o. male with medical history significant  h/o HTN, cocaine and alcohol use, with the last alcohol use was last night 1 beer, last cocaine use Taylor patient last year presents with progressive shortness of breath unable to catch his breath for the past 3 weeks.  Has dyspnea on exertion worsening last 3 weeks.  Positive for orthopnea.  No chest pain.  Feels palpitations at times. ED Course: In the ED patient was found with a flutter 2-1 RVR, was given IV metoprolol and IV Lasix.  Patient back went back to sinus rhythm and then converted back to a flutter now.  Was started on Cardizem drip.  Cardiology [place] [facility] was consulted.  BNP elevated at 903.  Sodium 129.  Creatinine 1.90  Review of Systems: All systems reviewed and otherwise negative.    Past Medical History:  Diagnosis Date  . Hypertension   . Migraines     History reviewed. No pertinent surgical history.   reports that he has been smoking cigarettes. He has been smoking about 0.50 packs Taylor day. He has never used smokeless tobacco. He reports current alcohol use of about 2.0 - 3.0 standard drinks of alcohol Taylor week. He reports current drug use. Frequency: 1.00 time Taylor week. Drug: Cocaine.  No Known Allergies  History reviewed. No pertinent family history.   Prior to Admission medications   Medication Sig Start Date End Date Taking? Authorizing Provider  albuterol (PROVENTIL HFA;VENTOLIN HFA) 108 (90 Base) MCG/ACT inhaler Inhale 2 puffs into the lungs every 6 (six) hours as needed for wheezing or shortness of breath. Patient not taking: Reported on 02/13/2020 11/23/17   Phineas Semen, MD  traMADol (ULTRAM) 50 MG tablet Take 1 tablet (50 mg total) by mouth every 6 (six) hours as needed. Patient not taking: Reported  on 02/13/2020 04/06/18   Faythe Ghee, PA-C    Physical Exam: Vitals:   02/13/20 1457 02/13/20 1459 02/13/20 1619  BP: (!) 170/140  (!) 76/64  Pulse: (!) 195  (!) 130  Resp: (!) 24  20  Temp: (!) 96.9 F (36.1 C)    TempSrc: Axillary    SpO2: 100%  93%  Weight:  90.7 kg   Height:  5\' 11"  (1.803 m)     Constitutional: NAD, calm, comfortable Vitals:   02/13/20 1457 02/13/20 1459 02/13/20 1619  BP: (!) 170/140  (!) 76/64  Pulse: (!) 195  (!) 130  Resp: (!) 24  20  Temp: (!) 96.9 F (36.1 C)    TempSrc: Axillary    SpO2: 100%  93%  Weight:  90.7 kg   Height:  5\' 11"  (1.803 m)   Gen: becomes sob with talking Eyes: EOMI ENMT:Mask Neck: normal, supple Respiratory: clear to auscultation bilaterally, no wheezing, no crackles.  Cardiovascular:Tachycardic, s1/s2  Abdomen: soft ,nt/nd +bs LE: 3+ pitting edema b/l , onychomycosis.  Skin: warm, dry  Neurologic: aaxox3, grossly intact Psychiatric: Normal judgment and insight.Normal mood.    Labs on Admission: I have personally reviewed following labs and imaging studies  CBC: Recent Labs  Lab 02/13/20 1503  WBC 9.2  HGB 14.3  HCT 44.9  MCV 94.7  PLT 194   Basic Metabolic Panel: Recent Labs  Lab 02/13/20 1503  NA 129*  K 4.8  CL 96*  CO2 20*  GLUCOSE 106*  BUN 42*  CREATININE 1.90*  CALCIUM 9.0   GFR: Estimated Creatinine Clearance: 45.9 mL/min (A) (by C-G formula based on SCr of 1.9 mg/dL (H)). Liver Function Tests: Recent Labs  Lab 02/13/20 1503  AST 79*  ALT 89*  ALKPHOS 173*  BILITOT 3.0*  PROT 8.4*  ALBUMIN 3.6   No results for input(s): LIPASE, AMYLASE in the last 168 hours. No results for input(s): AMMONIA in the last 168 hours. Coagulation Profile: No results for input(s): INR, PROTIME in the last 168 hours. Cardiac Enzymes: No results for input(s): CKTOTAL, CKMB, CKMBINDEX, TROPONINI in the last 168 hours. BNP (last 3 results) No results for input(s): PROBNP in the last 8760  hours. HbA1C: No results for input(s): HGBA1C in the last 72 hours. CBG: No results for input(s): GLUCAP in the last 168 hours. Lipid Profile: No results for input(s): CHOL, HDL, LDLCALC, TRIG, CHOLHDL, LDLDIRECT in the last 72 hours. Thyroid Function Tests: No results for input(s): TSH, T4TOTAL, FREET4, T3FREE, THYROIDAB in the last 72 hours. Anemia Panel: No results for input(s): VITAMINB12, FOLATE, FERRITIN, TIBC, IRON, RETICCTPCT in the last 72 hours. Urine analysis:    Component Value Date/Time   COLORURINE YELLOW (A) 11/23/2017 1549   APPEARANCEUR CLEAR (A) 11/23/2017 1549   LABSPEC 1.005 11/23/2017 1549   PHURINE 5.0 11/23/2017 1549   GLUCOSEU NEGATIVE 11/23/2017 1549   HGBUR SMALL (A) 11/23/2017 1549   BILIRUBINUR NEGATIVE 11/23/2017 1549   KETONESUR NEGATIVE 11/23/2017 1549   PROTEINUR NEGATIVE 11/23/2017 1549   NITRITE NEGATIVE 11/23/2017 1549   LEUKOCYTESUR NEGATIVE 11/23/2017 1549    Radiological Exams on Admission: DG Chest 2 View  Result Date: 02/13/2020 CLINICAL DATA:  Progressive shortness of breath over last 3 weeks, atrial flutter EXAM: CHEST - 2 VIEW COMPARISON:  04/06/2018 FINDINGS: Frontal and lateral views of the chest demonstrate an enlarged cardiac silhouette. Stable atherosclerosis of the aortic arch. There is diffuse ground-glass airspace disease throughout the lungs. No effusion or pneumothorax. Lungs are hyperinflated. No acute bony abnormalities. IMPRESSION: 1. Diffuse ground-glass airspace disease most compatible with edema given clinical presentation. 2. Cardiomegaly. 3. Atherosclerosis. Electronically Signed   By: Randa Ngo M.D.   On: 02/13/2020 15:41    EKG: Independently reviewed. Aflutter rvr 2:1  Assessment/Plan Active Problems:   Atrial flutter ([place])    1. Sob- likely 2/2 acute chf and aflutter rvr Pt tachycardic, volume overloaded, started on cardizem Cards consulted already by ER Continue cardizem gtt Start Lovenox 1mg /kg bid  dose Ck toxicology screen and alcohol screen Lasix 20mg  iv bid , increase as bp tolerates. Strict I's and O's Daily weight  2. HTN- on Hctz. Hold bp med  3. Hyponatremia- 2/2 volume overload Please start with Lasix 20 mg IV twice daily increase if BP allows Check a.m. labs  4.  A flutter with RVR Unsure when patient went into a flutter.  He has been having symptoms for 3 weeks. We will start on Lovenox therapeutic dose twice daily Check echo for EF Continue Cardizem drip Cardiology already consulted will follow up If rate and rhythm not controlled may need TEE cardioversion versus anticoagulation if thrombus present on TEE   5.  AKI-last creatinine in 2018 0.85 Likely prerenal from volume overload and CHF Monitor as diuresing to see improvement  6.  Acute CHF-diastolic versus systolic Possibly tachycardia mediated systolic dysfunction.  Has a history of cocaine use. Will give Lasix as above Check echo as above Monitor BMP periodically  DVT prophylaxis: lovenox Code Status: full Family Communication: none at bedside Disposition Plan: back home  Consults called: Cardiology [place] Admission status: Inpatient as patient requires 2 midnight stays for medical issues   Lynn Ito MD [facility] Pager [phone]   If 7PM-7AM, please contact night-coverage [email or web address] Password [facility]  02/13/2020, 5:39 PM

## 2020-02-13 NOTE — ED Triage Notes (Signed)
Pt presents from home via acems with c/o shortness of breath that has progessivlely gotten worse over the last 3 weeks. Pt denies any known cardiac/pulmonary hx. Pt a flutter on the monitor with RVR of 140 bpm for ems. Pt given 5 mg metoprolol in route with no change in heart rate. Pt currently alert and oriented x4.

## 2020-02-14 ENCOUNTER — Inpatient Hospital Stay
Admit: 2020-02-14 | Discharge: 2020-02-14 | Disposition: A | Discharge status: Home / Self Care | Payer: Medicaid Other | Attending: Internal Medicine | Admitting: Internal Medicine

## 2020-02-14 ENCOUNTER — Inpatient Hospital Stay: Payer: Medicaid Other

## 2020-02-14 DIAGNOSIS — R339 Retention of urine, unspecified: Secondary | ICD-10-CM

## 2020-02-14 DIAGNOSIS — R9431 Abnormal electrocardiogram [ECG] [EKG]: Secondary | ICD-10-CM

## 2020-02-14 LAB — CBC WITH DIFFERENTIAL/PLATELET
Abs Immature Granulocytes: 1.1 10*3/uL — ABNORMAL HIGH (ref 0.00–0.07)
Basophils Absolute: 0.1 10*3/uL (ref 0.0–0.1)
Basophils Relative: 1 %
Eosinophils Absolute: 0 10*3/uL (ref 0.0–0.5)
Eosinophils Relative: 0 %
HCT: 46.5 % (ref 39.0–52.0)
Hemoglobin: 14.3 g/dL (ref 13.0–17.0)
Immature Granulocytes: 9 %
Lymphocytes Relative: 12 %
Lymphs Abs: 1.6 10*3/uL (ref 0.7–4.0)
MCH: 30.4 pg (ref 26.0–34.0)
MCHC: 30.8 g/dL (ref 30.0–36.0)
MCV: 98.9 fL (ref 80.0–100.0)
Monocytes Absolute: 0.4 10*3/uL (ref 0.1–1.0)
Monocytes Relative: 3 %
Neutro Abs: 9.6 10*3/uL — ABNORMAL HIGH (ref 1.7–7.7)
Neutrophils Relative %: 75 %
Platelets: 166 10*3/uL (ref 150–400)
RBC: 4.7 MIL/uL (ref 4.22–5.81)
RDW: 14.8 % (ref 11.5–15.5)
Smear Review: NORMAL
WBC: 12.8 10*3/uL — ABNORMAL HIGH (ref 4.0–10.5)
nRBC: 0.2 % (ref 0.0–0.2)

## 2020-02-14 LAB — MRSA PCR SCREENING: MRSA by PCR: NEGATIVE

## 2020-02-14 LAB — URINE DRUG SCREEN, QUALITATIVE (ARMC ONLY)
Amphetamines, Ur Screen: NOT DETECTED
Barbiturates, Ur Screen: NOT DETECTED
Benzodiazepine, Ur Scrn: NOT DETECTED
Cannabinoid 50 Ng, Ur ~~LOC~~: NOT DETECTED
Cocaine Metabolite,Ur ~~LOC~~: NOT DETECTED
MDMA (Ecstasy)Ur Screen: NOT DETECTED
Methadone Scn, Ur: NOT DETECTED
Opiate, Ur Screen: NOT DETECTED
Phencyclidine (PCP) Ur S: NOT DETECTED
Tricyclic, Ur Screen: NOT DETECTED

## 2020-02-14 LAB — BLOOD GAS, ARTERIAL
Acid-base deficit: 19.8 mmol/L — ABNORMAL HIGH (ref 0.0–2.0)
Acid-base deficit: 5.3 mmol/L — ABNORMAL HIGH (ref 0.0–2.0)
Bicarbonate: 12 mmol/L — ABNORMAL LOW (ref 20.0–28.0)
Bicarbonate: 21.2 mmol/L (ref 20.0–28.0)
FIO2: 1
FIO2: 0.8
MECHVT: 450 mL
MECHVT: 450 mL
Mechanical Rate: 20
Mechanical Rate: 24
O2 Saturation: 96.7 %
O2 Saturation: 99.8 %
PEEP: 5 cmH2O
PEEP: 5 cmH2O
Patient temperature: 37
Patient temperature: 37
RATE: 20 resp/min
RATE: 24 resp/min
pCO2 arterial: 52 mmHg — ABNORMAL HIGH (ref 32.0–48.0)
pCO2 arterial: 44 mmHg (ref 32.0–48.0)
pH, Arterial: 6.97 — CL (ref 7.350–7.450)
pH, Arterial: 7.29 — ABNORMAL LOW (ref 7.350–7.450)
pO2, Arterial: 130 mmHg — ABNORMAL HIGH (ref 83.0–108.0)
pO2, Arterial: 231 mmHg — ABNORMAL HIGH (ref 83.0–108.0)

## 2020-02-14 LAB — BASIC METABOLIC PANEL
Anion gap: 16 — ABNORMAL HIGH (ref 5–15)
BUN: 54 mg/dL — ABNORMAL HIGH (ref 8–23)
CO2: 25 mmol/L (ref 22–32)
Calcium: 7.8 mg/dL — ABNORMAL LOW (ref 8.9–10.3)
Chloride: 89 mmol/L — ABNORMAL LOW (ref 98–111)
Creatinine, Ser: 3.06 mg/dL — ABNORMAL HIGH (ref 0.61–1.24)
GFR calc Af Amer: 24 mL/min — ABNORMAL LOW (ref >60)
GFR calc non Af Amer: 21 mL/min — ABNORMAL LOW (ref >60)
Glucose, Bld: 311 mg/dL — ABNORMAL HIGH (ref 70–99)
Potassium: 4.3 mmol/L (ref 3.5–5.1)
Sodium: 130 mmol/L — ABNORMAL LOW (ref 135–145)

## 2020-02-14 LAB — COMPREHENSIVE METABOLIC PANEL
ALT: 107 U/L — ABNORMAL HIGH (ref 0–44)
AST: 157 U/L — ABNORMAL HIGH (ref 15–41)
Albumin: 3 g/dL — ABNORMAL LOW (ref 3.5–5.0)
Alkaline Phosphatase: 154 U/L — ABNORMAL HIGH (ref 38–126)
Anion gap: 23 — ABNORMAL HIGH (ref 5–15)
BUN: 50 mg/dL — ABNORMAL HIGH (ref 8–23)
CO2: 16 mmol/L — ABNORMAL LOW (ref 22–32)
Calcium: 8.5 mg/dL — ABNORMAL LOW (ref 8.9–10.3)
Chloride: 90 mmol/L — ABNORMAL LOW (ref 98–111)
Creatinine, Ser: 3.17 mg/dL — ABNORMAL HIGH (ref 0.61–1.24)
GFR calc Af Amer: 23 mL/min — ABNORMAL LOW (ref >60)
GFR calc non Af Amer: 20 mL/min — ABNORMAL LOW (ref >60)
Glucose, Bld: 191 mg/dL — ABNORMAL HIGH (ref 70–99)
Potassium: 5.2 mmol/L — ABNORMAL HIGH (ref 3.5–5.1)
Sodium: 129 mmol/L — ABNORMAL LOW (ref 135–145)
Total Bilirubin: 4.3 mg/dL — ABNORMAL HIGH (ref 0.3–1.2)
Total Protein: 7.5 g/dL (ref 6.5–8.1)

## 2020-02-14 LAB — ECHOCARDIOGRAM COMPLETE
Height: 71 in
Weight: 3200 oz

## 2020-02-14 LAB — PROTIME-INR
INR: 2.3 — ABNORMAL HIGH (ref 0.8–1.2)
Prothrombin Time: 24.9 seconds — ABNORMAL HIGH (ref 11.4–15.2)

## 2020-02-14 LAB — GLUCOSE, CAPILLARY
Glucose-Capillary: 122 mg/dL — ABNORMAL HIGH (ref 70–99)
Glucose-Capillary: 133 mg/dL — ABNORMAL HIGH (ref 70–99)
Glucose-Capillary: 140 mg/dL — ABNORMAL HIGH (ref 70–99)
Glucose-Capillary: 185 mg/dL — ABNORMAL HIGH (ref 70–99)
Glucose-Capillary: 261 mg/dL — ABNORMAL HIGH (ref 70–99)
Glucose-Capillary: 261 mg/dL — ABNORMAL HIGH (ref 70–99)
Glucose-Capillary: 54 mg/dL — ABNORMAL LOW (ref 70–99)
Glucose-Capillary: 72 mg/dL (ref 70–99)

## 2020-02-14 LAB — HIV ANTIBODY (ROUTINE TESTING W REFLEX): HIV Screen 4th Generation wRfx: NONREACTIVE

## 2020-02-14 LAB — APTT: aPTT: 45 seconds — ABNORMAL HIGH (ref 24–36)

## 2020-02-14 LAB — PHOSPHORUS: Phosphorus: 11.3 mg/dL — ABNORMAL HIGH (ref 2.5–4.6)

## 2020-02-14 LAB — MAGNESIUM: Magnesium: 2.6 mg/dL — ABNORMAL HIGH (ref 1.7–2.4)

## 2020-02-14 LAB — PROCALCITONIN: Procalcitonin: 1.41 ng/mL

## 2020-02-14 MED ORDER — VECURONIUM BROMIDE 10 MG IV SOLR
10.0000 mg | Freq: Once | INTRAVENOUS | Status: AC
  Administered 2020-02-14: 10 mg via INTRAVENOUS
  Filled 2020-02-14: qty 10

## 2020-02-14 MED ORDER — PERFLUTREN LIPID MICROSPHERE
1.0000 mL | INTRAVENOUS | Status: AC | PRN
  Administered 2020-02-14: 2 mL via INTRAVENOUS
  Filled 2020-02-14: qty 10

## 2020-02-14 MED ORDER — LORAZEPAM 1 MG PO TABS: 1.0000 mg | ORAL_TABLET | ORAL | Status: DC | PRN

## 2020-02-14 MED ORDER — SODIUM BICARBONATE 8.4 % IV SOLN: INTRAVENOUS | Status: DC

## 2020-02-14 MED ORDER — MIDAZOLAM HCL 2 MG/2ML IJ SOLN
2.0000 mg | Freq: Once | INTRAMUSCULAR | Status: AC
  Administered 2020-02-14: 2 mg via INTRAVENOUS
  Filled 2020-02-14: qty 2

## 2020-02-14 MED ORDER — INSULIN ASPART 100 UNIT/ML IV SOLN
10.0000 [IU] | Freq: Once | INTRAVENOUS | Status: AC
  Administered 2020-02-14: 10 [IU] via INTRAVENOUS
  Filled 2020-02-14: qty 0.1

## 2020-02-14 MED ORDER — VITAL AF 1.2 CAL PO LIQD
1000.0000 mL | ORAL | Status: DC
  Administered 2020-02-15 – 2020-02-16 (×2): 1000 mL

## 2020-02-14 MED ORDER — LACTATED RINGERS IV BOLUS
1000.0000 mL | Freq: Once | INTRAVENOUS | Status: AC
  Administered 2020-02-14: 1000 mL via INTRAVENOUS

## 2020-02-14 MED ORDER — SODIUM CHLORIDE 0.9% FLUSH: 10.0000 mL | INTRAVENOUS | Status: DC | PRN

## 2020-02-14 MED ORDER — IPRATROPIUM-ALBUTEROL 0.5-2.5 (3) MG/3ML IN SOLN
3.0000 mL | Freq: Four times a day (QID) | RESPIRATORY_TRACT | Status: DC
  Administered 2020-02-14 – 2020-02-21 (×30): 3 mL via RESPIRATORY_TRACT
  Filled 2020-02-14 (×30): qty 3

## 2020-02-14 MED ORDER — VITAL HIGH PROTEIN PO LIQD: 1000.0000 mL | ORAL | Status: DC

## 2020-02-14 MED ORDER — LORAZEPAM 2 MG/ML IJ SOLN
1.0000 mg | INTRAMUSCULAR | Status: DC | PRN
  Administered 2020-02-14 – 2020-02-15 (×2): 2 mg via INTRAVENOUS
  Filled 2020-02-14 (×2): qty 1

## 2020-02-14 MED ORDER — FAMOTIDINE IN NACL 20-0.9 MG/50ML-% IV SOLN
20.0000 mg | INTRAVENOUS | Status: DC
  Administered 2020-02-14: 20 mg via INTRAVENOUS
  Filled 2020-02-14: qty 50

## 2020-02-14 MED ORDER — BUDESONIDE 0.5 MG/2ML IN SUSP
0.5000 mg | Freq: Two times a day (BID) | RESPIRATORY_TRACT | Status: DC
  Administered 2020-02-14 – 2020-02-21 (×15): 0.5 mg via RESPIRATORY_TRACT
  Filled 2020-02-14 (×15): qty 2

## 2020-02-14 MED ORDER — CHLORHEXIDINE GLUCONATE CLOTH 2 % EX PADS
6.0000 | MEDICATED_PAD | Freq: Every day | CUTANEOUS | Status: DC
  Administered 2020-02-14 – 2020-02-28 (×14): 6 via TOPICAL

## 2020-02-14 MED ORDER — SODIUM CHLORIDE 0.9% FLUSH
10.0000 mL | Freq: Two times a day (BID) | INTRAVENOUS | Status: DC
  Administered 2020-02-14 – 2020-02-15 (×3): 10 mL
  Administered 2020-02-15 – 2020-02-16 (×2): 30 mL
  Administered 2020-02-16: 21:00:00 10 mL
  Administered 2020-02-17: 30 mL
  Administered 2020-02-18: 10 mL
  Administered 2020-02-18 – 2020-02-19 (×2): 30 mL
  Administered 2020-02-19 – 2020-02-26 (×12): 10 mL

## 2020-02-14 MED ORDER — DEXTROSE 50 % IV SOLN: 1.0000 | Freq: Once | INTRAVENOUS | Status: AC

## 2020-02-14 MED ORDER — ORAL CARE MOUTH RINSE
15.0000 mL | OROMUCOSAL | Status: DC
  Administered 2020-02-14 – 2020-02-21 (×70): 15 mL via OROMUCOSAL

## 2020-02-14 MED ORDER — SODIUM BICARBONATE 8.4 % IV SOLN
150.0000 meq | Freq: Once | INTRAVENOUS | Status: AC
  Administered 2020-02-14: 150 meq via INTRAVENOUS
  Filled 2020-02-14: qty 50

## 2020-02-14 MED ORDER — DEXTROSE 50 % IV SOLN
INTRAVENOUS | Status: AC
  Administered 2020-02-14: 50 mL via INTRAVENOUS
  Filled 2020-02-14: qty 50

## 2020-02-14 MED ORDER — THIAMINE HCL 100 MG/ML IJ SOLN
100.0000 mg | Freq: Every day | INTRAMUSCULAR | Status: DC
  Administered 2020-02-14 – 2020-02-21 (×8): 100 mg via INTRAVENOUS
  Filled 2020-02-14 (×8): qty 2

## 2020-02-14 MED ORDER — CHLORHEXIDINE GLUCONATE 0.12% ORAL RINSE (MEDLINE KIT)
15.0000 mL | Freq: Two times a day (BID) | OROMUCOSAL | Status: DC
  Administered 2020-02-14 – 2020-02-21 (×15): 15 mL via OROMUCOSAL

## 2020-02-14 MED ORDER — DEXTROSE 50 % IV SOLN
25.0000 g | INTRAVENOUS | Status: AC
  Administered 2020-02-14: 25 g via INTRAVENOUS

## 2020-02-14 MED ORDER — SODIUM BICARBONATE-DEXTROSE 150-5 MEQ/L-% IV SOLN
150.0000 meq | INTRAVENOUS | Status: DC
  Administered 2020-02-14 – 2020-02-16 (×6): 150 meq via INTRAVENOUS
  Filled 2020-02-14 (×6): qty 1000

## 2020-02-14 MED ORDER — METHYLPREDNISOLONE SODIUM SUCC 40 MG IJ SOLR
40.0000 mg | Freq: Two times a day (BID) | INTRAMUSCULAR | Status: DC
  Administered 2020-02-14 – 2020-02-22 (×17): 40 mg via INTRAVENOUS
  Filled 2020-02-14 (×17): qty 1

## 2020-02-14 MED ORDER — NOREPINEPHRINE 16 MG/250ML-% IV SOLN
0.0000 ug/min | INTRAVENOUS | Status: DC
  Administered 2020-02-14: 18 ug/min via INTRAVENOUS
  Administered 2020-02-14 – 2020-02-16 (×2): 2 ug/min via INTRAVENOUS
  Filled 2020-02-14 (×2): qty 250

## 2020-02-14 MED ORDER — IPRATROPIUM-ALBUTEROL 0.5-2.5 (3) MG/3ML IN SOLN: 3.0000 mL | Freq: Four times a day (QID) | RESPIRATORY_TRACT | Status: DC | PRN

## 2020-02-14 MED ORDER — FOLIC ACID 5 MG/ML IJ SOLN
1.0000 mg | Freq: Every day | INTRAMUSCULAR | Status: DC
  Administered 2020-02-14 – 2020-02-22 (×9): 1 mg via INTRAVENOUS
  Filled 2020-02-14 (×12): qty 0.2

## 2020-02-14 MED ORDER — DEXTROSE 50 % IV SOLN
1.0000 | Freq: Once | INTRAVENOUS | Status: AC
  Administered 2020-02-14: 50 mL via INTRAVENOUS
  Filled 2020-02-14: qty 50

## 2020-02-14 MED ORDER — FAMOTIDINE IN NACL 20-0.9 MG/50ML-% IV SOLN
20.0000 mg | Freq: Every day | INTRAVENOUS | Status: DC
  Administered 2020-02-14 – 2020-02-20 (×7): 20 mg via INTRAVENOUS
  Filled 2020-02-14 (×7): qty 50

## 2020-02-14 NOTE — Consult Note (Signed)
[facility] Consultation Note  Patient ID: Shawn Taylor, MRN: [id number], DOB/AGE: 1956/08/08 64 y.o. Admit date: 02/13/2020   Date of Consult: 02/14/2020 Primary Physician: Patient, No Pcp Per Primary Cardiologist: None  Chief Complaint:  Chief Complaint  Patient presents with  . Shortness of Breath  . Atrial Fibrillation   Reason for Consult: Atrial flutter heart failure  HPI: 64 y.o. male with known hypertension hyperlipidemia chronic kidney disease with significant new onset of shortness of breath cough and congestion for which she was seen in the emergency room.  At that time the patient was diagnosed with bilateral lower extremity DVTs with concerns of pulmonary embolism.  In addition to that the patient had an acute episode of respiratory arrest and then cardiopulmonary arrest for which the patient was intubated and hypotensive requiring multiple medication management for pressure support.  Currently the patient appears to be more hemodynamically stable and ventilated well.  Additionally the patient at the time of admission had atrial flutter with rapid ventricular rate at a 2 to 150 bpm.  This was with nonspecific ST changes.  This also contributed to his current issues likely causing congestive heart failure.  He was placed on diltiazem and amiodarone drip for which she is now converted to normal rhythm with normal EKG at a heart rate of 89 bpm.  He has had a BNP of 903 and a troponin of 30 most consistent with congestive heart failure hypoxia from above issues without evidence of myocardial infarction  Past Medical History:  Diagnosis Date  . Hypertension   . Migraines       Surgical History: History reviewed. No pertinent surgical history.   Home Meds: Prior to Admission medications   Medication Sig Start Date End Date Taking? Authorizing Provider  albuterol (PROVENTIL HFA;VENTOLIN HFA) 108 (90 Base) MCG/ACT inhaler Inhale 2 puffs into the lungs every 6 (six)  hours as needed for wheezing or shortness of breath. Patient not taking: Reported on 02/13/2020 11/23/17   Nance Pear, MD  traMADol (ULTRAM) 50 MG tablet Take 1 tablet (50 mg total) by mouth every 6 (six) hours as needed. Patient not taking: Reported on 02/13/2020 04/06/18   Versie Starks, PA-C    Inpatient Medications:  . budesonide (PULMICORT) nebulizer solution  0.5 mg Nebulization BID  . chlorhexidine gluconate (MEDLINE KIT)  15 mL Mouth Rinse BID  . Chlorhexidine Gluconate Cloth  6 each Topical Daily  . folic acid  1 mg Intravenous Daily  . ipratropium-albuterol  3 mL Nebulization Q6H  . mouth rinse  15 mL Mouth Rinse 10 times per day  . methylPREDNISolone (SOLU-MEDROL) injection  40 mg Intravenous Q12H  . multivitamin with minerals  1 tablet Oral Daily  . sodium chloride flush  10-40 mL Intracatheter Q12H  . thiamine injection  100 mg Intravenous Daily   . amiodarone 30 mg/hr (02/14/20 1117)  . famotidine (PEPCID) IV    . feeding supplement (VITAL AF 1.2 CAL)    . fentaNYL infusion INTRAVENOUS 150 mcg/hr (02/14/20 1117)  . norepinephrine (LEVOPHED) Adult infusion 2 mcg/min (02/14/20 1117)  . phenylephrine (NEO-SYNEPHRINE) Adult infusion    . sodium bicarbonate 150 mEq in dextrose 5% 1000 mL 150 mEq (02/14/20 1120)  . vasopressin (PITRESSIN) infusion - *FOR SHOCK*      Allergies: No Known Allergies  Social History   Socioeconomic History  . Marital status: Single    Spouse name: Not on file  . Number of children: Not on file  . Years  of education: Not on file  . Highest education level: Not on file  Occupational History  . Not on file  Tobacco Use  . Smoking status: Current Every Day Smoker    Packs/day: 0.50    Types: Cigarettes  . Smokeless tobacco: Never Used  Substance and Sexual Activity  . Alcohol use: Yes    Alcohol/week: 2.0 - 3.0 standard drinks    Types: 2 - 3 Cans of beer per week  . Drug use: Yes    Frequency: 1.0 times per week    Types:  Cocaine  . Sexual activity: Not on file  Other Topics Concern  . Not on file  Social History Narrative  . Not on file   Social Determinants of Health   Financial Resource Strain:   . Difficulty of Paying Living Expenses:   Food Insecurity:   . Worried About Charity fundraiser in the Last Year:   . Arboriculturist in the Last Year:   Transportation Needs:   . Film/video editor (Medical):   Marland Kitchen Lack of Transportation (Non-Medical):   Physical Activity:   . Days of Exercise per Week:   . Minutes of Exercise per Session:   Stress:   . Feeling of Stress :   Social Connections:   . Frequency of Communication with Friends and Family:   . Frequency of Social Gatherings with Friends and Family:   . Attends Religious Services:   . Active Member of Clubs or Organizations:   . Attends Archivist Meetings:   Marland Kitchen Marital Status:   Intimate Partner Violence:   . Fear of Current or Ex-Partner:   . Emotionally Abused:   Marland Kitchen Physically Abused:   . Sexually Abused:      History reviewed. No pertinent family history.   Review of Systems Cannot assess  Labs: No results for input(s): CKTOTAL, CKMB, TROPONINI in the last 72 hours. Lab Results  Component Value Date   WBC 12.8 (H) 02/14/2020   HGB 14.3 02/14/2020   HCT 46.5 02/14/2020   MCV 98.9 02/14/2020   PLT 166 02/14/2020    Recent Labs  Lab 02/14/20 0109 02/14/20 0109 02/14/20 0521  NA 129*   < > 130*  K 5.2*   < > 4.3  CL 90*   < > 89*  CO2 16*   < > 25  BUN 50*   < > 54*  CREATININE 3.17*   < > 3.06*  CALCIUM 8.5*   < > 7.8*  PROT 7.5  --   --   BILITOT 4.3*  --   --   ALKPHOS 154*  --   --   ALT 107*  --   --   AST 157*  --   --   GLUCOSE 191*   < > 311*   < > = values in this interval not displayed.   No results found for: CHOL, HDL, LDLCALC, TRIG No results found for: DDIMER  Radiology/Studies:  DG Chest 2 View  Result Date: 02/13/2020 CLINICAL DATA:  Progressive shortness of breath over last 3  weeks, atrial flutter EXAM: CHEST - 2 VIEW COMPARISON:  04/06/2018 FINDINGS: Frontal and lateral views of the chest demonstrate an enlarged cardiac silhouette. Stable atherosclerosis of the aortic arch. There is diffuse ground-glass airspace disease throughout the lungs. No effusion or pneumothorax. Lungs are hyperinflated. No acute bony abnormalities. IMPRESSION: 1. Diffuse ground-glass airspace disease most compatible with edema given clinical presentation. 2. Cardiomegaly. 3. Atherosclerosis.  Electronically Signed   By: Randa Ngo M.D.   On: 02/13/2020 15:41   US Venous Img Lower Bilateral  Result Date: 02/13/2020 CLINICAL DATA:  Bilateral lower extremity pain and swelling for several months EXAM: BILATERAL LOWER EXTREMITY VENOUS DOPPLER ULTRASOUND TECHNIQUE: Gray-scale sonography with graded compression, as well as color Doppler and duplex ultrasound were performed to evaluate the lower extremity deep venous systems from the level of the common femoral vein and including the common femoral, femoral, profunda femoral, popliteal and calf veins including the posterior tibial, peroneal and gastrocnemius veins when visible. The superficial great saphenous vein was also interrogated. Spectral Doppler was utilized to evaluate flow at rest and with distal augmentation maneuvers in the common femoral, femoral and popliteal veins. COMPARISON:  None. FINDINGS: RIGHT LOWER EXTREMITY Common Femoral Vein: No evidence of thrombus. Normal compressibility, respiratory phasicity and response to augmentation. Saphenofemoral Junction: No evidence of thrombus. Normal compressibility and flow on color Doppler imaging. Profunda Femoral Vein: No evidence of thrombus. Normal compressibility and flow on color Doppler imaging. Femoral Vein: No evidence of thrombus. Normal compressibility, respiratory phasicity and response to augmentation. Popliteal Vein: Occlusive thrombus throughout the right popliteal vein. Calf Veins:  Occlusive thrombus within the right posterior tibial and peroneal veins. Superficial Great Saphenous Vein: No evidence of thrombus. Normal compressibility. Other Findings:  None. LEFT LOWER EXTREMITY Common Femoral Vein: No evidence of thrombus. Normal compressibility, respiratory phasicity and response to augmentation. Saphenofemoral Junction: No evidence of thrombus. Normal compressibility and flow on color Doppler imaging. Profunda Femoral Vein: No evidence of thrombus. Normal compressibility and flow on color Doppler imaging. Femoral Vein: No evidence of thrombus. Normal compressibility, respiratory phasicity and response to augmentation. Popliteal Vein: Occlusive thrombus within the left popliteal vein. Calf Veins: There is occlusive thrombus within the left anterior peroneal vein. Superficial Great Saphenous Vein: No evidence of thrombus. Normal compressibility. Other Findings:  None. IMPRESSION: 1. Occlusive thrombus within the right popliteal vein, posterior tibial vein, and peroneal veins. 2. Occlusive thrombus within the left popliteal vein and left anterior peroneal vein. Electronically Signed   By: Randa Ngo M.D.   On: 02/13/2020 21:41   DG Chest Port 1 View  Result Date: 02/14/2020 CLINICAL DATA:  Initial evaluation for central line placement. EXAM: PORTABLE CHEST 1 VIEW COMPARISON:  Prior radiograph from 02/13/2020. FINDINGS: Endotracheal tube remains in place with tip positioned 2.4 cm above the carina. Interval placement of left IJ approach central venous catheter with tip overlying the cavoatrial junction. Enteric tube courses into the abdomen. Defibrillator pad overlies the left chest. Stable cardiomegaly. Mediastinal silhouette unchanged. Aortic atherosclerosis. Lungs hypoinflated. No pneumothorax. Probable diffuse pulmonary interstitial congestion/edema, grossly stable. No visible pleural effusion. No new consolidative opacity. Osseous structures unchanged. IMPRESSION: 1. Interval  placement of left IJ approach central venous catheter with tip overlying the cavoatrial junction. Remaining support apparatus in satisfactory position. 2. Stable cardiomegaly with probable mild diffuse pulmonary interstitial edema. Electronically Signed   By: Jeannine Boga M.D.   On: 02/14/2020 01:04   DG Chest Portable 1 View  Result Date: 02/13/2020 CLINICAL DATA:  Intubation EXAM: PORTABLE CHEST 1 VIEW COMPARISON:  02/13/2020 FINDINGS: Endotracheal tube is 2.5 cm above the carina. Cardiomegaly. Aortic calcifications. Bilateral airspace opacities have increased since prior study, likely edema. No effusions or acute bony abnormality. IMPRESSION: Endotracheal tube 2.5 cm above the carina. Cardiomegaly with vascular congestion and bilateral interstitial/airspace disease, likely edema. Electronically Signed   By: Rolm Baptise M.D.   On: 02/13/2020 23:44  EKG: Atrial flutter with rapid ventricular rate 150 bpm  Weights: Filed Weights   02/13/20 1459  Weight: 90.7 kg     Physical Exam: Blood pressure (!) 88/75, pulse 88, temperature 97.6 F (36.4 C), temperature source Oral, resp. rate (!) 24, height _0  (1.803 m), weight 90.7 kg, SpO2 96 %. Body mass index is 27.89 kg/m. General: Well developed, well nourished, in no acute distress. Head eyes ears nose throat: Normocephalic, atraumatic, sclera non-icteric, no xanthomas, nares are without discharge. No apparent thyromegaly and/or mass  Lungs: Normal respiratory effort.  Diffuse wheezes, no rales, no rhonchi.  Heart: RRR with normal S1 S2. no murmur gallop, no rub, PMI is normal size and placement, carotid upstroke normal without bruit, jugular venous pressure is normal Abdomen: Soft, non-tender, non-distended with normoactive bowel sounds. No hepatomegaly. No rebound/guarding. No obvious abdominal masses. Abdominal aorta is normal size without bruit Extremities: Trace to 1+ edema. no cyanosis, no clubbing, no ulcers  Peripheral : 2+  bilateral upper extremity pulses, 2+ bilateral femoral pulses, 2+ bilateral dorsal pedal pulse Neuro: Is not alert and oriented. No facial asymmetry. No focal deficit. Moves all extremities spontaneously. Musculoskeletal: Normal muscle tone without kyphosis Psych: Does not responds to questions appropriately with a normal affect.    Assessment: 64 year old male with hypertension hyperlipidemia chronic kidney disease with acute systolic dysfunction congestive heart failure and acute cardiorespiratory arrest needing ventilation with elevated troponin consistent with demand ischemia rather than acute coronary syndrome and elevated BNP consistent with congestive heart failure from multiple factors now somewhat stabilized from arrest  Plan: 1.  Continue supportive care for hypotension and cardiopulmonary arrest with ventilation and pressure support 2.  Continue amiodarone infusion for continued stabilization and maintenance of normal sinus rhythm 3.  Anticoagulation with heparin for further risk reduction in stroke with atrial flutter as well as deep venous thrombosis and possible pulmonary embolism contributing to above 4.  Echocardiogram for LV systolic dysfunction valvular heart disease pulmonary hypertension and RV failure due to possible pulmonary embolism 5.  Further investigation and treatment options after stabilization of above  Signed, Corey Skains M.D. [facility] 02/14/2020, 12:17 PM

## 2020-02-14 NOTE — Consult Note (Signed)
Shawn Taylor is a 64 y.o. critically ill male status post cardiac arrest felt secondary to DVT/PE with cardiogenic shock and multiorgan failure.    During resuscitation staff unable to place Foley catheter and he has had urethral bleeding.  Patient unable to give history and after chart review no previous history of open or endoscopic urologic procedures.  Foley catheter requested for strict I/O monitoring.  Exam: Abdomen soft, nondistended.  Phallus uncircumcised without lesions, blood at urethral meatus.  Catheter placement: A 0.038 straight tip Glidewire was passed per urethra and advanced without resistance.  A 16 [place] council catheter was then placed over the wire and advanced without difficulty or resistance.  Approximately 50 cc of blood-tinged urine was obtained.  The balloon was inflated with 10 cc of sterile water and placed to gravity drainage.  The procedure was performed at approximately 1115   Recommendation: Leave Foley catheter indwelling for at least 7 days.   Irineo Axon, MD

## 2020-02-14 NOTE — Progress Notes (Signed)
RT x2 Responded to Code Blue. Patient bagged throughout CPR. ED MD successfully intubated, 7.5ETT secured at 24cm at the lip, positive color change, equal bilateral breath sounds with chest rise. Patient placed on Vent. ED RT transported Patient to ICU.

## 2020-02-14 NOTE — Progress Notes (Addendum)
Notified by patient's RN that patient was having difficulties breathing and appeared to be tripoding. Repeat ABGs revealed severe metabolic acidosis. Patient received 2 amps of sodium bicarb and started on a drip. Given his soft pressure and uncontrolled HR, patient was switched from Cardizem to Amiodarone for better rate control. Per RN, she was in the process of administering the Amiodarone bolus when patient c/o sob and was noted to be confused trying to pull his NRB off his face. Patient became unresponsive with widen eyes rolling backwards. Code blue was initiated and patient resuscitated with ROSC. He was transferred to the ICU and PCCM consulted.   Webb Silversmith, DNP, CCRN, FNP-C [facility] Nurse Practitioner Between 7pm to 7am - Pager [phone] Actively using [employer] secure chat messaging  After 7am go to [email or web address] - [email or web address] select [facility]  [employer]  [phone]

## 2020-02-14 NOTE — Procedures (Signed)
Central Venous Catheter Insertion Procedure Note Shawn Taylor [id number] 06/04/1956  Procedure: Insertion of Central Venous Catheter Indications: Assessment of intravascular volume, Drug and/or fluid administration and Frequent blood sampling  Procedure Details Consent: Unable to obtain consent because of emergent medical necessity. Time Out: Verified patient identification, verified procedure, site/side was marked, verified correct patient position, special equipment/implants available, medications/allergies/relevent history reviewed, required imaging and test results available.  Performed  Maximum sterile technique was used including antiseptics, cap, gloves, gown, hand hygiene, mask and sheet. Skin prep: Chlorhexidine; local anesthetic administered A antimicrobial bonded/coated triple lumen catheter was placed in the left internal jugular vein using the Seldinger technique.  Evaluation Blood flow good Complications: No apparent complications Patient did tolerate procedure well. Chest X-ray ordered to verify placement.  CXR: normal.  Left internal jugular central line placed utilizing ultrasound no complications noted during or following procedure.  Sonda Rumble, AGNP  Pulmonary/Critical Care Pager [phone] (please enter 7 digits) PCCM Consult Pager [phone] (please enter 7 digits)

## 2020-02-14 NOTE — Progress Notes (Signed)
Inpatient Diabetes Program Recommendations  AACE/ADA: New Consensus Statement on Inpatient Glycemic Control   Target Ranges:  Prepandial:   less than 140 mg/dL      Peak postprandial:   less than 180 mg/dL (1-2 hours)      Critically ill patients:  140 - 180 mg/dL   Results for KACE, HARTJE (MRN [id number]) as of 02/14/2020 09:43  Ref. Range 02/14/2020 00:08 02/14/2020 01:15 02/14/2020 02:18 02/14/2020 04:06 02/14/2020 08:17  Glucose-Capillary Latest Ref Range: 70 - 99 mg/dL 54 (L) 72 163 (H) 845 (H) 185 (H)  Results for RAYFORD, WILLIAMSEN (MRN [id number]) as of 02/14/2020 09:43  Ref. Range 02/13/2020 15:03 02/14/2020 01:09 02/14/2020 05:21  Glucose Latest Ref Range: 70 - 99 mg/dL 224 (H) 825 (H) 003 (H)   Review of Glycemic Control  Diabetes history: No Outpatient Diabetes medications: NA Current orders for Inpatient glycemic control: Solumedrol 40 mg Q12H  Inpatient Diabetes Program Recommendations:   Correction (SSI): If steroids are continued, please consider ordering CBGs Q4H with Novolog 0-9 units Q4H.  Thanks, Orlando Penner, RN, MSN, CDE Diabetes Coordinator Inpatient Diabetes Program [phone] (Team Pager from 8am to 5pm)

## 2020-02-14 NOTE — Progress Notes (Signed)
CRITICAL CARE NOTE  64 yo male admitted s/p respiratory arrest followed by cardiac arrest, cardiogenic shock, pulmonary edema, severe metabolic acidosis, BLE DVT's concerning for possible pulmonary embolisms requiring mechanical intubation and levophed gtt  SIGNIFICANT EVENTS/STUDIES:  03/22: Pt admitted to the stepdown unit due to atrial flutter with rvr, acute hypoxic respiratory failure secondary to severe metabolic acidosis and pulmonary edema  03/22: Venous US Bilateral Extremity revealed occlusive thrombus within the right popliteal vein, posterior tibial vein, and peroneal veins. Occlusive thrombus within the left popliteal vein and left anterior peroneal vein. 03/22: While awaiting bed availability in the stepdown unit pt respiratory arrested which led to cardiac arrest ROSC obtained, pt mechanically intubation, and admitted to ICU      CC  follow up respiratory failure  SUBJECTIVE Patient remains critically ill Prognosis is guarded Multiorgan failure  BP 131/86   Pulse 92   Temp 97.9 F (36.6 C) (Oral)   Resp (!) 24   Ht 5\' 11"  (1.803 m)   Wt 90.7 kg   SpO2 100%   BMI 27.89 kg/m    I/O last 3 completed shifts: In: 345.1 [I.V.:323.5; IV Piggyback:21.6] Out: -  No intake/output data recorded.  SpO2: 100 % O2 Flow Rate (L/min): 15 L/min FiO2 (%): 50 %(Per NP and ABG results)  Estimated body mass index is 27.89 kg/m as calculated from the following:   Height as of this encounter: 5\' 11"  (1.803 m).   Weight as of this encounter: 90.7 kg.  SIGNIFICANT EVENTS   REVIEW OF SYSTEMS  PATIENT IS UNABLE TO PROVIDE COMPLETE REVIEW OF SYSTEMS DUE TO SEVERE CRITICAL ILLNESS        PHYSICAL EXAMINATION:  GENERAL:critically ill appearing, +resp distress HEAD: Normocephalic, atraumatic.  EYES: Pupils equal, round, reactive to light.  No scleral icterus.  MOUTH: Moist mucosal membrane. NECK: Supple.  PULMONARY: +rhonchi, +wheezing CARDIOVASCULAR: S1 and S2.  Regular rate and rhythm. No murmurs, rubs, or gallops.  GASTROINTESTINAL: Soft, nontender, -distended.  Positive bowel sounds.   MUSCULOSKELETAL: No swelling, clubbing, or edema.  NEUROLOGIC: obtunded, GCS<8 SKIN:intact,warm,dry  MEDICATIONS: I have reviewed all medications and confirmed regimen as documented   CULTURE RESULTS   Recent Results (from the past 240 hour(s))  Respiratory Panel by RT PCR (Flu A&B, Covid) - Nasopharyngeal Swab     Status: None   Collection Time: 02/13/20  4:21 PM   Specimen: Nasopharyngeal Swab  Result Value Ref Range Status   SARS Coronavirus 2 by RT PCR NEGATIVE NEGATIVE Final    Comment: (NOTE) SARS-CoV-2 target nucleic acids are NOT DETECTED. The SARS-CoV-2 RNA is generally detectable in upper respiratoy specimens during the acute phase of infection. The lowest concentration of SARS-CoV-2 viral copies this assay can detect is 131 copies/mL. A negative result does not preclude SARS-Cov-2 infection and should not be used as the sole basis for treatment or other patient management decisions. A negative result may occur with  improper specimen collection/handling, submission of specimen other than nasopharyngeal swab, presence of viral mutation(s) within the areas targeted by this assay, and inadequate number of viral copies (<131 copies/mL). A negative result must be combined with clinical observations, patient history, and epidemiological information. The expected result is Negative. Fact Sheet for Patients:  Fact Sheet for Healthcare Providers:  02/15/20 This test is not yet ap proved or cleared by the https://www.moore.com/ FDA and  has been authorized for detection and/or diagnosis of SARS-CoV-2 by FDA under an Emergency Use Authorization (EUA). This EUA will remain  in effect (meaning this test can be used) for the duration of the COVID-19 declaration under Section 564(b)(1) of  the Act, 21 U.S.C. section 360bbb-3(b)(1), unless the authorization is terminated or revoked sooner.    Influenza A by PCR NEGATIVE NEGATIVE Final   Influenza B by PCR NEGATIVE NEGATIVE Final    Comment: (NOTE) The Xpert Xpress SARS-CoV-2/FLU/RSV assay is intended as an aid in  the diagnosis of influenza from Nasopharyngeal swab specimens and  should not be used as a sole basis for treatment. Nasal washings and  aspirates are unacceptable for Xpert Xpress SARS-CoV-2/FLU/RSV  testing. Fact Sheet for Patients: https://www.moore.com/ Fact Sheet for Healthcare Providers: https://www.young.biz/ This test is not yet approved or cleared by the [place] FDA and  has been authorized for detection and/or diagnosis of SARS-CoV-2 by  FDA under an Emergency Use Authorization (EUA). This EUA will remain  in effect (meaning this test can be used) for the duration of the  Covid-19 declaration under Section 564(b)(1) of the Act, 21  U.S.C. section 360bbb-3(b)(1), unless the authorization is  terminated or revoked. Performed at [facility], [address] [place], [place] [address]           IMAGING    DG Chest 2 View  Result Date: 02/13/2020 CLINICAL DATA:  Progressive shortness of breath over last 3 weeks, atrial flutter EXAM: CHEST - 2 VIEW COMPARISON:  04/06/2018 FINDINGS: Frontal and lateral views of the chest demonstrate an enlarged cardiac silhouette. Stable atherosclerosis of the aortic arch. There is diffuse ground-glass airspace disease throughout the lungs. No effusion or pneumothorax. Lungs are hyperinflated. No acute bony abnormalities. IMPRESSION: 1. Diffuse ground-glass airspace disease most compatible with edema given clinical presentation. 2. Cardiomegaly. 3. Atherosclerosis. Electronically Signed   By: Sharlet Salina M.D.   On: 02/13/2020 15:41   US Venous Img Lower Bilateral  Result Date: 02/13/2020 CLINICAL DATA:   Bilateral lower extremity pain and swelling for several months EXAM: BILATERAL LOWER EXTREMITY VENOUS DOPPLER ULTRASOUND TECHNIQUE: Gray-scale sonography with graded compression, as well as color Doppler and duplex ultrasound were performed to evaluate the lower extremity deep venous systems from the level of the common femoral vein and including the common femoral, femoral, profunda femoral, popliteal and calf veins including the posterior tibial, peroneal and gastrocnemius veins when visible. The superficial great saphenous vein was also interrogated. Spectral Doppler was utilized to evaluate flow at rest and with distal augmentation maneuvers in the common femoral, femoral and popliteal veins. COMPARISON:  None. FINDINGS: RIGHT LOWER EXTREMITY Common Femoral Vein: No evidence of thrombus. Normal compressibility, respiratory phasicity and response to augmentation. Saphenofemoral Junction: No evidence of thrombus. Normal compressibility and flow on color Doppler imaging. Profunda Femoral Vein: No evidence of thrombus. Normal compressibility and flow on color Doppler imaging. Femoral Vein: No evidence of thrombus. Normal compressibility, respiratory phasicity and response to augmentation. Popliteal Vein: Occlusive thrombus throughout the right popliteal vein. Calf Veins: Occlusive thrombus within the right posterior tibial and peroneal veins. Superficial Great Saphenous Vein: No evidence of thrombus. Normal compressibility. Other Findings:  None. LEFT LOWER EXTREMITY Common Femoral Vein: No evidence of thrombus. Normal compressibility, respiratory phasicity and response to augmentation. Saphenofemoral Junction: No evidence of thrombus. Normal compressibility and flow on color Doppler imaging. Profunda Femoral Vein: No evidence of thrombus. Normal compressibility and flow on color Doppler imaging. Femoral Vein: No evidence of thrombus. Normal compressibility, respiratory phasicity and response to augmentation.  Popliteal Vein: Occlusive thrombus within the left popliteal vein. Calf Veins:  There is occlusive thrombus within the left anterior peroneal vein. Superficial Great Saphenous Vein: No evidence of thrombus. Normal compressibility. Other Findings:  None. IMPRESSION: 1. Occlusive thrombus within the right popliteal vein, posterior tibial vein, and peroneal veins. 2. Occlusive thrombus within the left popliteal vein and left anterior peroneal vein. Electronically Signed   By: Randa Ngo M.D.   On: 02/13/2020 21:41   DG Chest Port 1 View  Result Date: 02/14/2020 CLINICAL DATA:  Initial evaluation for central line placement. EXAM: PORTABLE CHEST 1 VIEW COMPARISON:  Prior radiograph from 02/13/2020. FINDINGS: Endotracheal tube remains in place with tip positioned 2.4 cm above the carina. Interval placement of left IJ approach central venous catheter with tip overlying the cavoatrial junction. Enteric tube courses into the abdomen. Defibrillator pad overlies the left chest. Stable cardiomegaly. Mediastinal silhouette unchanged. Aortic atherosclerosis. Lungs hypoinflated. No pneumothorax. Probable diffuse pulmonary interstitial congestion/edema, grossly stable. No visible pleural effusion. No new consolidative opacity. Osseous structures unchanged. IMPRESSION: 1. Interval placement of left IJ approach central venous catheter with tip overlying the cavoatrial junction. Remaining support apparatus in satisfactory position. 2. Stable cardiomegaly with probable mild diffuse pulmonary interstitial edema. Electronically Signed   By: Jeannine Boga M.D.   On: 02/14/2020 01:04   DG Chest Portable 1 View  Result Date: 02/13/2020 CLINICAL DATA:  Intubation EXAM: PORTABLE CHEST 1 VIEW COMPARISON:  02/13/2020 FINDINGS: Endotracheal tube is 2.5 cm above the carina. Cardiomegaly. Aortic calcifications. Bilateral airspace opacities have increased since prior study, likely edema. No effusions or acute bony abnormality.  IMPRESSION: Endotracheal tube 2.5 cm above the carina. Cardiomegaly with vascular congestion and bilateral interstitial/airspace disease, likely edema. Electronically Signed   By: Rolm Baptise M.D.   On: 02/13/2020 23:44     Nutrition Status:           Indwelling Urinary Catheter continued, requirement due to   Reason to continue Indwelling Urinary Catheter strict Intake/Output monitoring for hemodynamic instability   Central Line/ continued, requirement due to  Reason to continue Damon of central venous pressure or other hemodynamic parameters and poor IV access   Ventilator continued, requirement due to severe respiratory failure   Ventilator Sedation RASS 0 to -2      ASSESSMENT AND PLAN SYNOPSIS 64 year old African-American male status post cardiac arrest due to DVTs and PEs with cardiogenic shock with multiorgan failure    Severe ACUTE Hypoxic and Hypercapnic Respiratory Failure -continue Full MV support -continue Bronchodilator Therapy -Wean Fio2 and PEEP as tolerated Check echocardiogram    ACUTE KIDNEY INJURY/Renal Failure -follow chem 7 -follow UO -continue Foley Catheter-assess need -Avoid nephrotoxic agents -Recheck creatinine  Urology consultation for Foley insertion Check urine drug screen for cocaine   NEUROLOGY - intubated and sedated - minimal sedation to achieve a RASS goal: -1   SHOCK-CARDIOGENIC -use vasopressors to keep MAP>65 -follow ABG and LA -follow up cultures -emperic ABX -consider stress dose steroids -aggressive IV fluid resuscitation  CARDIAC ICU monitoring  ID -continue IV abx as prescibed -follow up cultures  GI GI PROPHYLAXIS as indicated  NUTRITIONAL STATUS Nutrition Status:         DIET-->TF's as tolerated Constipation protocol as indicated  ENDO - will use ICU hypoglycemic\Hyperglycemia protocol if indicated   ELECTROLYTES -follow labs as needed -replace as needed -pharmacy  consultation and following   DVT/GI PRX ordered TRANSFUSIONS AS NEEDED MONITOR FSBS ASSESS the need for LABS as needed   Critical Care Time devoted to patient care services described in  this note is 32 minutes.   Overall, patient is critically ill, prognosis is guarded.  Patient with Multiorgan failure and at high risk for cardiac arrest and death.    Lucie Leather, M.D.  [facility]  Medical Director [facility] Medical Director [facility] Cardio-Pulmonary Department

## 2020-02-14 NOTE — Progress Notes (Signed)
Urology able to place catheter this morning, patient only made 20ccs of urine all day. MDs aware, no new orders, gave two LR bolus' and increased levophed gtt significantly to help maintain BP and perfuse kidneys. Patient woke up agitated, but not following commands. Family updated and children at bedside.

## 2020-02-14 NOTE — Progress Notes (Signed)
Initial Nutrition Assessment  DOCUMENTATION CODES:   Not applicable  INTERVENTION:  Initiate Vital AF 1.2 Cal at 65 mL/hr (1560 mL goal daily volume). Provides 1872 kcal, 117 grams of protein, 1264 mL H2O daily.  Provide minimum free water flush of 30 mL Q4hrs to maintain tube patency.  NUTRITION DIAGNOSIS:   Inadequate oral intake related to inability to eat as evidenced by NPO status.  GOAL:   Provide needs based on ASPEN/SCCM guidelines  MONITOR:   Vent status, Labs, Weight trends, TF tolerance, I & O's  REASON FOR ASSESSMENT:   Ventilator    ASSESSMENT:   64 year old male with PMHx of migraines, HTN admitted s/p respiratory arrest followed by cardiac arrest, cardiogenic shock, pulmonary edema, severe metabolic acidosis, bilateral lower extremity DVTs concerning for possible PE requiring intubation.   Patient is currently intubated on ventilator support MV: 9.6 L/min Temp (24hrs), Avg:97.5 F (36.4 C), Min:96.9 F (36.1 C), Max:97.9 F (36.6 C)  Propofol: N/A  Medications reviewed and include: folic acid 1 mg daily, Solu-Medrol 40 mg Q12hrs IV, MVI daily, thiamine 100 mg daily, amiodarone, famotidine, fentanyl gtt, Levophed gtt at 2 mcg/min, sodium bicarbonate 150 mEq in D5 at 100 mL/hr.  Labs reviewed: CBG 185-261, Sodium 130, Chloride 89, BUN 54, Creatinine 3.06.  Enteral Access: OGT vs NGT (tube not yet documented); terminates in stomach per chest x-ray today  Discussed on rounds. Plan is to start tube feeds today. Unable to assess patient at bedside or complete NFPE as Urology was in the room inserting a foley.  NUTRITION - FOCUSED PHYSICAL EXAM:  Unable to complete at this time.  Diet Order:   Diet Order    None     EDUCATION NEEDS:   No education needs have been identified at this time  Skin:  Skin Assessment: Reviewed RN Assessment  Last BM:  Unknown/PTA  Height:   Ht Readings from Last 1 Encounters:  02/13/20 5\' 11"  (1.803 m)   Weight:    Wt Readings from Last 1 Encounters:  02/13/20 90.7 kg   Ideal Body Weight:  78.2 kg  BMI:  Body mass index is 27.89 kg/m.  Estimated Nutritional Needs:   Kcal:  1857 (PSU 2003b w/ MSJ 1728, Ve 9.6, Tmax 36.6)  Protein:  115-135 grams  Fluid:  >/= 2 L/day  02/15/20, MS, RD, LDN Pager number available on Amion

## 2020-02-14 NOTE — Progress Notes (Signed)
*  PRELIMINARY RESULTS* Echocardiogram 2D Echocardiogram has been performed.  Shawn Taylor 02/14/2020, 9:45 AM

## 2020-02-14 NOTE — Consult Note (Signed)
ANTICOAGULATION CONSULT NOTE - Initial Consult  Pharmacy Consult for Heparin infusion Indication: DVT  No Known Allergies  Patient Measurements: Height: 5\' 11"  (180.3 cm) Weight: 200 lb (90.7 kg) IBW/kg (Calculated) : 75.3 Heparin Dosing Weight: 90.7 kg  Vital Signs: Temp: 97.6 F (36.4 C) (03/23 0730) Temp Source: Oral (03/23 0730) BP: 89/77 (03/23 1100) Pulse Rate: 90 (03/23 1100)  Labs: Recent Labs    02/13/20 1503 02/14/20 0109 02/14/20 0521  HGB 14.3 14.3  --   HCT 44.9 46.5  --   PLT 194 166  --   APTT  --  45*  --   LABPROT  --  24.9*  --   INR  --  2.3*  --   CREATININE 1.90* 3.17* 3.06*  TROPONINIHS 30*  --   --     Estimated Creatinine Clearance: 28.5 mL/min (A) (by C-G formula based on SCr of 3.06 mg/dL (H)).   Medical History: Past Medical History:  Diagnosis Date  . Hypertension   . Migraines     Medications:  -No anticoagulation prior to admission per chart -Last dose therapeutic enoxaparin 3/23 @ 0637  Assessment: Patient is a 64 y/o M with a history of hypertension, cocaine, tobacco, and alcohol use who presented to [facility] ED with new onset CHF and atrial flutter and subsequently went into cardiac arrest. Patient is intubated and mechanically ventilated for respiratory failure. Found to have multiple lower extremity DVTs on doppler. Initially started on therapeutic enoxaparin which was discontinued secondary to worsening renal function. Pharmacy has been consulted to initiate heparin infusion for treatment of VTE.   Baseline aPTT 45, INR 2.3 (drawn after first dose enoxaparin). Baseline H&H, platelets within normal limits.   Given worsening renal function (Scr 1.90 >> 3.06) expect impaired clearance of enoxaparin.  Goal of Therapy:  Heparin level 0.3-0.7 units/ml Monitor platelets by anticoagulation protocol: Yes   Plan:  -Hold heparin today. Heparin level tomorrow AM.  -Anticipate initiation tomorrow morning as appropriate based on  labs -Daily CBC per protocol  [facility]  Pharmacy Resident 02/14/2020,11:31 AM

## 2020-02-14 NOTE — Progress Notes (Signed)
Patient was being treated, became distressed, intubated, and transfer to ICU-18. Doctor advised patient family by phone. Prayer for patient. Chaplain offered emotional support to staff, it was a busy night in ED.

## 2020-02-15 DIAGNOSIS — N170 Acute kidney failure with tubular necrosis: Secondary | ICD-10-CM

## 2020-02-15 DIAGNOSIS — K7201 Acute and subacute hepatic failure with coma: Secondary | ICD-10-CM

## 2020-02-15 LAB — CBC WITH DIFFERENTIAL/PLATELET
Abs Immature Granulocytes: 0.26 10*3/uL — ABNORMAL HIGH (ref 0.00–0.07)
Basophils Absolute: 0 10*3/uL (ref 0.0–0.1)
Basophils Relative: 0 %
Eosinophils Absolute: 0 10*3/uL (ref 0.0–0.5)
Eosinophils Relative: 0 %
HCT: 45.3 % (ref 39.0–52.0)
Hemoglobin: 14.3 g/dL (ref 13.0–17.0)
Immature Granulocytes: 1 %
Lymphocytes Relative: 7 %
Lymphs Abs: 1.6 10*3/uL (ref 0.7–4.0)
MCH: 30.4 pg (ref 26.0–34.0)
MCHC: 31.6 g/dL (ref 30.0–36.0)
MCV: 96.2 fL (ref 80.0–100.0)
Monocytes Absolute: 1.1 10*3/uL — ABNORMAL HIGH (ref 0.1–1.0)
Monocytes Relative: 5 %
Neutro Abs: 18.5 10*3/uL — ABNORMAL HIGH (ref 1.7–7.7)
Neutrophils Relative %: 87 %
Platelets: 169 10*3/uL (ref 150–400)
RBC: 4.71 MIL/uL (ref 4.22–5.81)
RDW: 14.9 % (ref 11.5–15.5)
WBC: 21.4 10*3/uL — ABNORMAL HIGH (ref 4.0–10.5)
nRBC: 0.2 % (ref 0.0–0.2)

## 2020-02-15 LAB — COMPREHENSIVE METABOLIC PANEL
ALT: 934 U/L — ABNORMAL HIGH (ref 0–44)
AST: 2752 U/L — ABNORMAL HIGH (ref 15–41)
Albumin: 2.8 g/dL — ABNORMAL LOW (ref 3.5–5.0)
Alkaline Phosphatase: 130 U/L — ABNORMAL HIGH (ref 38–126)
Anion gap: 24 — ABNORMAL HIGH (ref 5–15)
BUN: 65 mg/dL — ABNORMAL HIGH (ref 8–23)
CO2: 23 mmol/L (ref 22–32)
Calcium: 7.3 mg/dL — ABNORMAL LOW (ref 8.9–10.3)
Chloride: 87 mmol/L — ABNORMAL LOW (ref 98–111)
Creatinine, Ser: 4.56 mg/dL — ABNORMAL HIGH (ref 0.61–1.24)
GFR calc Af Amer: 15 mL/min — ABNORMAL LOW (ref >60)
GFR calc non Af Amer: 13 mL/min — ABNORMAL LOW (ref >60)
Glucose, Bld: 181 mg/dL — ABNORMAL HIGH (ref 70–99)
Potassium: 4.8 mmol/L (ref 3.5–5.1)
Sodium: 134 mmol/L — ABNORMAL LOW (ref 135–145)
Total Bilirubin: 3.3 mg/dL — ABNORMAL HIGH (ref 0.3–1.2)
Total Protein: 6.6 g/dL (ref 6.5–8.1)

## 2020-02-15 LAB — BLOOD GAS, ARTERIAL
Acid-base deficit: 1.5 mmol/L (ref 0.0–2.0)
Bicarbonate: 24.3 mmol/L (ref 20.0–28.0)
FIO2: 0.6
MECHVT: 450 mL
Mechanical Rate: 24
O2 Saturation: 97.8 %
PEEP: 5 cmH2O
Patient temperature: 37
RATE: 24 resp/min
pCO2 arterial: 44 mmHg (ref 32.0–48.0)
pH, Arterial: 7.35 (ref 7.350–7.450)
pO2, Arterial: 106 mmHg (ref 83.0–108.0)

## 2020-02-15 LAB — GLUCOSE, CAPILLARY
Glucose-Capillary: 158 mg/dL — ABNORMAL HIGH (ref 70–99)
Glucose-Capillary: 161 mg/dL — ABNORMAL HIGH (ref 70–99)
Glucose-Capillary: 220 mg/dL — ABNORMAL HIGH (ref 70–99)
Glucose-Capillary: 213 mg/dL — ABNORMAL HIGH (ref 70–99)
Glucose-Capillary: 184 mg/dL — ABNORMAL HIGH (ref 70–99)
Glucose-Capillary: 192 mg/dL — ABNORMAL HIGH (ref 70–99)

## 2020-02-15 LAB — HEPARIN LEVEL (UNFRACTIONATED)
Heparin Unfractionated: 0.76 IU/mL — ABNORMAL HIGH (ref 0.30–0.70)
Heparin Unfractionated: 0.48 IU/mL (ref 0.30–0.70)
Heparin Unfractionated: 0.74 IU/mL — ABNORMAL HIGH (ref 0.30–0.70)

## 2020-02-15 LAB — TROPONIN I (HIGH SENSITIVITY)
Troponin I (High Sensitivity): 172 ng/L (ref <18)
Troponin I (High Sensitivity): 165 ng/L (ref <18)
Troponin I (High Sensitivity): 144 ng/L (ref <18)

## 2020-02-15 LAB — MAGNESIUM: Magnesium: 2.1 mg/dL (ref 1.7–2.4)

## 2020-02-15 LAB — AMMONIA: Ammonia: 66 umol/L — ABNORMAL HIGH (ref 9–35)

## 2020-02-15 LAB — PROCALCITONIN: Procalcitonin: 9.21 ng/mL

## 2020-02-15 LAB — PHOSPHORUS: Phosphorus: 11.4 mg/dL — ABNORMAL HIGH (ref 2.5–4.6)

## 2020-02-15 MED ORDER — LACTATED RINGERS IV BOLUS
1000.0000 mL | Freq: Once | INTRAVENOUS | Status: AC
  Administered 2020-02-15: 1000 mL via INTRAVENOUS

## 2020-02-15 MED ORDER — MIDAZOLAM HCL 2 MG/2ML IJ SOLN
2.0000 mg | INTRAMUSCULAR | Status: AC | PRN
  Administered 2020-02-16 (×3): 2 mg via INTRAVENOUS
  Filled 2020-02-15 (×3): qty 2

## 2020-02-15 MED ORDER — MIDAZOLAM HCL 2 MG/2ML IJ SOLN
2.0000 mg | INTRAMUSCULAR | Status: DC | PRN
  Administered 2020-02-16 – 2020-02-20 (×9): 2 mg via INTRAVENOUS
  Filled 2020-02-15 (×9): qty 2

## 2020-02-15 MED ORDER — ADULT MULTIVITAMIN W/MINERALS CH
1.0000 | ORAL_TABLET | Freq: Every day | ORAL | Status: DC
  Administered 2020-02-17 – 2020-03-07 (×18): 1
  Filled 2020-02-15 (×18): qty 1

## 2020-02-15 MED ORDER — HEPARIN (PORCINE) 25000 UT/250ML-% IV SOLN
1200.0000 [IU]/h | INTRAVENOUS | Status: DC
  Administered 2020-02-15: 1100 [IU]/h via INTRAVENOUS
  Administered 2020-02-16: 14:00:00 950 [IU]/h via INTRAVENOUS
  Administered 2020-02-17: 1000 [IU]/h via INTRAVENOUS
  Administered 2020-02-18 – 2020-02-20 (×3): 1200 [IU]/h via INTRAVENOUS
  Filled 2020-02-15 (×5): qty 250

## 2020-02-15 MED ORDER — SODIUM CHLORIDE 0.9 % IV SOLN
3.0000 g | Freq: Two times a day (BID) | INTRAVENOUS | Status: DC
  Administered 2020-02-15 – 2020-02-19 (×9): 3 g via INTRAVENOUS
  Filled 2020-02-15: qty 3
  Filled 2020-02-15: qty 8
  Filled 2020-02-15 (×8): qty 3

## 2020-02-15 NOTE — Progress Notes (Signed)
   02/15/20 0900  Clinical Encounter Type  Visited With Other (Comment)  Visit Type Follow-up  Referral From Chaplain  Consult/Referral To Chaplain  Chaplain stop to follow up with family, there was no family available. Chaplain ask nurse secretary to page if family request a Chaplain.

## 2020-02-15 NOTE — Progress Notes (Addendum)
Pharmacy Antibiotic Note  Shawn Taylor is a 64 y.o. male admitted on 02/13/2020 status post cardiac arrest due to DVTs and possible PE with cardiogenic shock and multiorgan failure. WBC trending up (9.2>12.8>21.4). Patient is afebrile since admission (3/22). Possible aspiration pneumonia. Pharmacy has been consulted for unasyn dosing.  Plan: Unasyn IV 3 g Q12H Will follow Scr tomorrow, patient in ARF.    Height: 5\' 11"  (180.3 cm) Weight: 200 lb (90.7 kg) IBW/kg (Calculated) : 75.3  Temp (24hrs), Avg:98 F (36.7 C), Min:97.8 F (36.6 C), Max:98.2 F (36.8 C)  Recent Labs  Lab 02/13/20 1503 02/14/20 0109 02/14/20 0521 02/15/20 0235  WBC 9.2 12.8*  --  21.4*  CREATININE 1.90* 3.17* 3.06* 4.56*    Estimated Creatinine Clearance: 19.1 mL/min (A) (by C-G formula based on SCr of 4.56 mg/dL (H)).    No Known Allergies  Antimicrobials this admission: Unasyn 3/24 >>   Dose adjustments this admission: n/a  Microbiology results: 3/23 BCx: Pending 3/24 Sputum: Pending   3/23 MRSA PCR: Negative  Thank you for allowing pharmacy to be a part of this patient's care.  4/23 02/15/2020 11:33 AM

## 2020-02-15 NOTE — Progress Notes (Signed)
CRITICAL CARE NOTE 64 yo male admitted s/p respiratory arrest followed by cardiac arrest, cardiogenic shock, pulmonary edema, severe metabolic acidosis, BLE DVT'sconcerning for possible pulmonary embolismsrequiring mechanical intubation and levophed gtt  SIGNIFICANT EVENTS/STUDIES: 03/22: Pt admitted to the stepdown unit due to atrial flutter with rvr, acute hypoxic respiratory failure secondary to severe metabolic acidosis and pulmonary edema 03/22: Venous US Bilateral Extremity revealed occlusive thrombus within the right popliteal vein, posterior tibial vein, and peroneal veins. Occlusive thrombus within the left popliteal vein and left anterior peroneal vein. 03/22: While awaiting bed availability in the stepdown unit pt respiratory arrested which led to cardiac arrest ROSC obtained, pt mechanically intubation, and admitted to ICU 3/23 multiorgan failure 3/24 remains critically ill, multiorgan failure     CC  follow up respiratory failure  SUBJECTIVE Patient remains critically ill Prognosis is guarded LIVER FAILURE CARDIAC FAILURE RESP FAILURE RENAL FAILURE    BP 106/76   Pulse 89   Temp 98.1 F (36.7 C) (Oral)   Resp (!) 24   Ht 5\' 11"  (1.803 m)   Wt 90.7 kg   SpO2 100%   BMI 27.89 kg/m    I/O last 3 completed shifts: In: 5596.5 [I.V.:3590.7; IV Piggyback:2005.8] Out: 20 [Urine:20] Total I/O In: 683.5 [I.V.:683.5] Out: 20 [Urine:20]  SpO2: 100 % O2 Flow Rate (L/min): 15 L/min FiO2 (%): 70 %  Estimated body mass index is 27.89 kg/m as calculated from the following:   Height as of this encounter: 5\' 11"  (1.803 m).   Weight as of this encounter: 90.7 kg.  SIGNIFICANT EVENTS   REVIEW OF SYSTEMS  PATIENT IS UNABLE TO PROVIDE COMPLETE REVIEW OF SYSTEMS DUE TO SEVERE CRITICAL ILLNESS     PHYSICAL EXAMINATION:  GENERAL:critically ill appearing, +resp distress HEAD: Normocephalic, atraumatic.  EYES: Pupils equal, round, reactive to light.  No  scleral icterus.  MOUTH: Moist mucosal membrane. NECK: Supple.  PULMONARY: +rhonchi, +wheezing CARDIOVASCULAR: S1 and S2. Regular rate and rhythm. No murmurs, rubs, or gallops.  GASTROINTESTINAL: Soft, nontender, -distended.  Positive bowel sounds.   MUSCULOSKELETAL: No swelling, clubbing, or edema.  NEUROLOGIC: obtunded, GCS<8 SKIN:intact,warm,dry  MEDICATIONS: I have reviewed all medications and confirmed regimen as documented   CULTURE RESULTS   Recent Results (from the past 240 hour(s))  Respiratory Panel by RT PCR (Flu A&B, Covid) - Nasopharyngeal Swab     Status: None   Collection Time: 02/13/20  4:21 PM   Specimen: Nasopharyngeal Swab  Result Value Ref Range Status   SARS Coronavirus 2 by RT PCR NEGATIVE NEGATIVE Final    Comment: (NOTE) SARS-CoV-2 target nucleic acids are NOT DETECTED. The SARS-CoV-2 RNA is generally detectable in upper respiratoy specimens during the acute phase of infection. The lowest concentration of SARS-CoV-2 viral copies this assay can detect is 131 copies/mL. A negative result does not preclude SARS-Cov-2 infection and should not be used as the sole basis for treatment or other patient management decisions. A negative result may occur with  improper specimen collection/handling, submission of specimen other than nasopharyngeal swab, presence of viral mutation(s) within the areas targeted by this assay, and inadequate number of viral copies (<131 copies/mL). A negative result must be combined with clinical observations, patient history, and epidemiological information. The expected result is Negative. Fact Sheet for Patients:  https://www.moore.com/ Fact Sheet for Healthcare Providers:  https://www.young.biz/ This test is not yet ap proved or cleared by the [place] FDA and  has been authorized for detection and/or diagnosis of SARS-CoV-2 by FDA under an  Emergency Use Authorization (EUA). This EUA will  remain  in effect (meaning this test can be used) for the duration of the COVID-19 declaration under Section 564(b)(1) of the Act, 21 U.S.C. section 360bbb-3(b)(1), unless the authorization is terminated or revoked sooner.    Influenza A by PCR NEGATIVE NEGATIVE Final   Influenza B by PCR NEGATIVE NEGATIVE Final    Comment: (NOTE) The Xpert Xpress SARS-CoV-2/FLU/RSV assay is intended as an aid in  the diagnosis of influenza from Nasopharyngeal swab specimens and  should not be used as a sole basis for treatment. Nasal washings and  aspirates are unacceptable for Xpert Xpress SARS-CoV-2/FLU/RSV  testing. Fact Sheet for Patients: https://www.moore.com/ Fact Sheet for Healthcare Providers: https://www.young.biz/ This test is not yet approved or cleared by the [place] FDA and  has been authorized for detection and/or diagnosis of SARS-CoV-2 by  FDA under an Emergency Use Authorization (EUA). This EUA will remain  in effect (meaning this test can be used) for the duration of the  Covid-19 declaration under Section 564(b)(1) of the Act, 21  U.S.C. section 360bbb-3(b)(1), unless the authorization is  terminated or revoked. Performed at [facility], [address] [place], [place] [address]   CULTURE, BLOOD (ROUTINE X 2) w Reflex to ID Panel     Status: None (Preliminary result)   Collection Time: 02/14/20  3:42 AM   Specimen: BLOOD  Result Value Ref Range Status   Specimen Description BLOOD LEFT HAND  Final   Special Requests   Final    BOTTLES DRAWN AEROBIC AND ANAEROBIC Blood Culture adequate volume   Culture   Final    NO GROWTH 1 DAY Performed at [facility], [address], [place], [place] [address]    Report Status PENDING  Incomplete  CULTURE, BLOOD (ROUTINE X 2) w Reflex to ID Panel     Status: None (Preliminary result)   Collection Time: 02/14/20  3:42 AM   Specimen: BLOOD  Result Value Ref Range Status    Specimen Description BLOOD RIGHT HAND  Final   Special Requests   Final    BOTTLES DRAWN AEROBIC AND ANAEROBIC Blood Culture results may not be optimal due to an inadequate volume of blood received in culture bottles   Culture   Final    NO GROWTH 1 DAY Performed at [facility], [address] [place], [place] [address]    Report Status PENDING  Incomplete  MRSA PCR Screening     Status: None   Collection Time: 02/14/20  3:49 AM   Specimen: Nasopharyngeal  Result Value Ref Range Status   MRSA by PCR NEGATIVE NEGATIVE Final    Comment:        The GeneXpert MRSA Assay (FDA approved for NASAL specimens only), is one component of a comprehensive MRSA colonization surveillance program. It is not intended to diagnose MRSA infection nor to guide or monitor treatment for MRSA infections. Performed at [facility], [address] [place], [place] [address]           IMAGING    DG Chest Port 1 View  Result Date: 02/14/2020 CLINICAL DATA:  Hypoxia EXAM: PORTABLE CHEST 1 VIEW COMPARISON:  02/14/2020 FINDINGS: Left central line tip at the cavoatrial junction. Endotracheal tube in stable position. NG tube is in the stomach. Cardiomegaly. Vascular congestion. Layering bilateral effusions with increasing bibasilar atelectasis. No acute bony abnormality. No pneumothorax. IMPRESSION: Cardiomegaly with vascular congestion, layering effusions and bibasilar atelectasis. Electronically Signed  By: Rolm Baptise M.D.   On: 02/14/2020 23:58   ECHOCARDIOGRAM COMPLETE  Result Date: 02/14/2020    ECHOCARDIOGRAM REPORT   Patient Name:   LYDIA MENG Date of Exam: 02/14/2020 Medical Rec #:  [id number]    Height:       71.0 in Accession #:    [id number]   Weight:       200.0 lb Date of Birth:  Mar 24, 1956   BSA:          2.109 m Patient Age:    3 years     BP:           86/71 mmHg Patient Gender: M            HR:           89 bpm. Exam Location:  [facility] Procedure: 2D Echo, Color  Doppler, Cardiac Doppler and Intracardiac            Opacification Agent Indications:     R94.31 Abnormal ECG  History:         Patient has no prior history of Echocardiogram examinations.                  Risk Factors:Hypertension and Cocaine use.  Sonographer:     Charmayne Sheer RDCS (AE) Referring Phys:  [id number] [facility] AMERY Diagnosing Phys: Serafina Royals MD  Sonographer Comments: Echo performed with patient supine and on artificial respirator and suboptimal apical window. IMPRESSIONS  1. Left ventricular ejection fraction, by estimation, is <20%. The left ventricle has severely decreased function. The left ventricle demonstrates global hypokinesis. The left ventricular internal cavity size was moderately dilated. Left ventricular diastolic parameters were normal.  2. Right ventricular systolic function is severely reduced. The right ventricular size is mildly enlarged. There is normal pulmonary artery systolic pressure.  3. Left atrial size was mildly dilated.  4. Right atrial size was mildly dilated.  5. The mitral valve is normal in structure. Moderate mitral valve regurgitation.  6. Tricuspid valve regurgitation is moderate.  7. The aortic valve is normal in structure. Aortic valve regurgitation is trivial. FINDINGS  Left Ventricle: Left ventricular ejection fraction, by estimation, is <20%. The left ventricle has severely decreased function. The left ventricle demonstrates global hypokinesis. Definity contrast agent was given IV to delineate the left ventricular endocardial borders. The left ventricular internal cavity size was moderately dilated. There is no left ventricular hypertrophy. Left ventricular diastolic parameters were normal. Right Ventricle: The right ventricular size is mildly enlarged. No increase in right ventricular wall thickness. Right ventricular systolic function is severely reduced. There is normal pulmonary artery systolic pressure. The tricuspid regurgitant velocity is 1.75 m/s, and with  an assumed right atrial pressure of 10 mmHg, the estimated right ventricular systolic pressure is 76.1 mmHg. Left Atrium: Left atrial size was mildly dilated. Right Atrium: Right atrial size was mildly dilated. Pericardium: There is no evidence of pericardial effusion. Mitral Valve: The mitral valve is normal in structure. Moderate mitral valve regurgitation. MV peak gradient, 5.4 mmHg. The mean mitral valve gradient is 2.0 mmHg. Tricuspid Valve: The tricuspid valve is normal in structure. Tricuspid valve regurgitation is moderate. Aortic Valve: The aortic valve is normal in structure. Aortic valve regurgitation is trivial. Aortic valve mean gradient measures 1.0 mmHg. Aortic valve peak gradient measures 1.4 mmHg. Aortic valve area, by VTI measures 3.94 cm. Pulmonic Valve: The pulmonic valve was normal in structure. Pulmonic valve regurgitation is mild. Aorta: The aortic root and ascending aorta  are structurally normal, with no evidence of dilitation. IAS/Shunts: No atrial level shunt detected by color flow Doppler.  LEFT VENTRICLE PLAX 2D LVIDd:         6.05 cm      Diastology LVIDs:         4.47 cm      LV e' lateral:   7.07 cm/s LV PW:         0.82 cm      LV E/e' lateral: 12.5 LV IVS:        0.72 cm      LV e' medial:    4.13 cm/s LVOT diam:     2.30 cm      LV E/e' medial:  21.4 LV SV:         25 LV SV Index:   12 LVOT Area:     4.15 cm  LV Volumes (MOD) LV vol d, MOD A2C: 136.0 ml LV vol d, MOD A4C: 128.0 ml LV vol s, MOD A2C: 92.9 ml LV vol s, MOD A4C: 84.4 ml LV SV MOD A2C:     43.1 ml LV SV MOD A4C:     128.0 ml LV SV MOD BP:      44.6 ml RIGHT VENTRICLE RV Basal diam:  4.69 cm LEFT ATRIUM             Index       RIGHT ATRIUM           Index LA diam:        4.10 cm 1.94 cm/m  RA Area:     22.50 cm LA Vol (A2C):   86.2 ml 40.88 ml/m RA Volume:   74.40 ml  35.28 ml/m LA Vol (A4C):   57.2 ml 27.13 ml/m LA Biplane Vol: 67.5 ml 32.01 ml/m  AORTIC VALVE                   PULMONIC VALVE AV Area (Vmax):     3.23 cm    PV Vmax:       0.52 m/s AV Area (Vmean):   3.43 cm    PV Vmean:      33.100 cm/s AV Area (VTI):     3.94 cm    PV VTI:        0.065 m AV Vmax:           58.10 cm/s  PV Peak grad:  1.1 mmHg AV Vmean:          35.100 cm/s PV Mean grad:  1.0 mmHg AV VTI:            0.063 m AV Peak Grad:      1.4 mmHg AV Mean Grad:      1.0 mmHg LVOT Vmax:         45.10 cm/s LVOT Vmean:        29.000 cm/s LVOT VTI:          0.060 m LVOT/AV VTI ratio: 0.95  AORTA Ao Root diam: 3.20 cm MITRAL VALVE               TRICUSPID VALVE MV Area (PHT): 3.99 cm    TR Peak grad:   12.2 mmHg MV Peak grad:  5.4 mmHg    TR Vmax:        175.00 cm/s MV Mean grad:  2.0 mmHg MV Vmax:       1.16 m/s    SHUNTS MV Vmean:  61.2 cm/s   Systemic VTI:  0.06 m MV Decel Time: 190 msec    Systemic Diam: 2.30 cm MV E velocity: 88.30 cm/s MV A velocity: 45.60 cm/s MV E/A ratio:  1.94 Arnoldo Hooker MD Electronically signed by Arnoldo Hooker MD Signature Date/Time: 02/14/2020/12:30:43 PM    Final      Nutrition Status: Nutrition Problem: Inadequate oral intake Etiology: inability to eat Signs/Symptoms: NPO status Interventions: Tube feeding  BMP Latest Ref Rng & Units 02/15/2020 02/14/2020 02/14/2020  Glucose 70 - 99 mg/dL 063(K) 160(F) 093(A)  BUN 8 - 23 mg/dL 35(T) 73(U) 20(U)  Creatinine 0.61 - 1.24 mg/dL 5.42(H) 0.62(B) 7.62(G)  Sodium 135 - 145 mmol/L 134(L) 130(L) 129(L)  Potassium 3.5 - 5.1 mmol/L 4.8 4.3 5.2(H)  Chloride 98 - 111 mmol/L 87(L) 89(L) 90(L)  CO2 22 - 32 mmol/L 23 25 16(L)  Calcium 8.9 - 10.3 mg/dL 7.3(L) 7.8(L) 8.5(L)     Indwelling Urinary Catheter continued, requirement due to   Reason to continue Indwelling Urinary Catheter strict Intake/Output monitoring for hemodynamic instability   Central Line/ continued, requirement due to  Reason to continue [employer] Monitoring of central venous pressure or other hemodynamic parameters and poor IV access   Ventilator continued, requirement due to severe  respiratory failure   Ventilator Sedation RASS 0 to -2      ASSESSMENT AND PLAN SYNOPSIS 64 year old African-American male status post cardiac arrest due to DVTs and PEs with cardiogenic shock with multiorgan failure  Severe ACUTE Hypoxic and Hypercapnic Respiratory Failure -continue Full MV support -continue Bronchodilator Therapy -Wean Fio2 and PEEP as tolerated  ACUTE SYSTOLIC CARDIAC FAILURE- EF  ACUTE KIDNEY INJURY/Renal Failure -follow chem 7 -follow UO -continue Foley Catheter-assess need -Avoid nephrotoxic agents -Recheck creatinine    NEUROLOGY 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 Unstable for CT MRi at this time   SHOCK-SEPSIS/HYPOVOLUMIC/CARDIOGENIC -use vasopressors to keep MAP>65 -follow ABG and LA -follow up cultures -emperic ABX -consider stress dose steroids -aggressive IV fluid resuscitation  CARDIAC ICU monitoring  ID -continue IV abx as prescibed -follow up cultures  GI GI PROPHYLAXIS as indicated  NUTRITIONAL STATUS Nutrition Status: Nutrition Problem: Inadequate oral intake Etiology: inability to eat Signs/Symptoms: NPO status Interventions: Tube feeding   DIET-->TF's as tolerated Constipation protocol as indicated  ENDO - will use ICU hypoglycemic\Hyperglycemia protocol if indicated   ELECTROLYTES -follow labs as needed -replace as needed -pharmacy consultation and following   DVT/GI PRX ordered TRANSFUSIONS AS NEEDED MONITOR FSBS ASSESS the need for LABS as needed   Critical Care Time devoted to patient care services described in this note is 37 minutes.   Overall, patient is critically ill, prognosis is guarded.  Patient with Multiorgan failure and at high risk for cardiac arrest and death.   RECOMMEND PALLIATIVE CARE CONSULTATION  Lucie Leather, M.D.  [facility]  Medical Director [facility] Medical Director [facility] Cardio-Pulmonary Department

## 2020-02-15 NOTE — Consult Note (Signed)
ANTICOAGULATION CONSULT NOTE - Initial Consult  Pharmacy Consult for Heparin infusion Indication: DVT  No Known Allergies  Patient Measurements: Height: 5\' 11"  (180.3 cm) Weight: 200 lb (90.7 kg) IBW/kg (Calculated) : 75.3 Heparin Dosing Weight: 90.7 kg  Vital Signs: Temp: 98.3 F (36.8 C) (03/24 1200) Temp Source: Oral (03/24 1200) BP: 93/71 (03/24 1200) Pulse Rate: 86 (03/24 1200)  Labs: Recent Labs    02/13/20 1503 02/13/20 1503 02/14/20 0109 02/14/20 0521 02/15/20 0005 02/15/20 0235 02/15/20 0515 02/15/20 1110  HGB 14.3   < > 14.3  --   --  14.3  --   --   HCT 44.9  --  46.5  --   --  45.3  --   --   PLT 194  --  166  --   --  169  --   --   APTT  --   --  45*  --   --   --   --   --   LABPROT  --   --  24.9*  --   --   --   --   --   INR  --   --  2.3*  --   --   --   --   --   HEPARINUNFRC  --   --   --   --   --  0.76*  --  0.48  CREATININE 1.90*   < > 3.17* 3.06*  --  4.56*  --   --   TROPONINIHS 30*   < >  --   --  172* 165* 144*  --    < > = values in this interval not displayed.    Estimated Creatinine Clearance: 19.1 mL/min (A) (by C-G formula based on SCr of 4.56 mg/dL (H)).   Medical History: Past Medical History:  Diagnosis Date  . Hypertension   . Migraines     Medications:  -No anticoagulation prior to admission per chart -Last dose therapeutic enoxaparin 3/23 @ 0637  Assessment: Patient is a 64 y/o M with a history of hypertension, cocaine, tobacco, and alcohol use who presented to [facility] ED with new onset CHF and atrial flutter and subsequently went into cardiac arrest. Patient is intubated and mechanically ventilated for respiratory failure. Found to have multiple lower extremity DVTs on doppler. Initially started on therapeutic enoxaparin which was discontinued secondary to worsening renal function. Pharmacy has been consulted to initiate heparin infusion for treatment of VTE.   Baseline aPTT 45, INR 2.3 (drawn after first dose  enoxaparin). Baseline H&H, platelets within normal limits.   Patient is in acute renal failure with oliguria.   Goal of Therapy:  Heparin level 0.3-0.7 units/ml Monitor platelets by anticoagulation protocol: Yes   Plan:  -03/24 @ 1110 HL 0.48. Will start heparin infusion at 1100 units/hr, no bolus -Heparin level 8 hours after initiation -Daily CBC per protocol  04-28-1992 Pharmacy Resident 02/15/2020,12:20 PM

## 2020-02-15 NOTE — Consult Note (Signed)
ANTICOAGULATION CONSULT NOTE - Initial Consult  Pharmacy Consult for Heparin infusion Indication: DVT  No Known Allergies  Patient Measurements: Height: 5\' 11"  (180.3 cm) Weight: 200 lb (90.7 kg) IBW/kg (Calculated) : 75.3 Heparin Dosing Weight: 90.7 kg  Vital Signs: Temp: 98.2 F (36.8 C) (03/24 1611) Temp Source: Oral (03/24 1611) BP: 98/82 (03/24 1800) Pulse Rate: 88 (03/24 1800)  Labs: Recent Labs    02/13/20 1503 02/13/20 1503 02/14/20 0109 02/14/20 0521 02/15/20 0005 02/15/20 0235 02/15/20 0515 02/15/20 1110 02/15/20 2208  HGB 14.3   < > 14.3  --   --  14.3  --   --   --   HCT 44.9  --  46.5  --   --  45.3  --   --   --   PLT 194  --  166  --   --  169  --   --   --   APTT  --   --  45*  --   --   --   --   --   --   LABPROT  --   --  24.9*  --   --   --   --   --   --   INR  --   --  2.3*  --   --   --   --   --   --   HEPARINUNFRC  --   --   --   --   --  0.76*  --  0.48 0.74*  CREATININE 1.90*   < > 3.17* 3.06*  --  4.56*  --   --   --   TROPONINIHS 30*   < >  --   --  172* 165* 144*  --   --    < > = values in this interval not displayed.    Estimated Creatinine Clearance: 19.1 mL/min (A) (by C-G formula based on SCr of 4.56 mg/dL (H)).   Medical History: Past Medical History:  Diagnosis Date  . Hypertension   . Migraines     Medications:  -No anticoagulation prior to admission per chart -Last dose therapeutic enoxaparin 3/23 @ 0637  Assessment: Patient is a 64 y/o M with a history of hypertension, cocaine, tobacco, and alcohol use who presented to [facility] ED with new onset CHF and atrial flutter and subsequently went into cardiac arrest. Patient is intubated and mechanically ventilated for respiratory failure. Found to have multiple lower extremity DVTs on doppler. Initially started on therapeutic enoxaparin which was discontinued secondary to worsening renal function. Pharmacy has been consulted to initiate heparin infusion for treatment of VTE.    Baseline aPTT 45, INR 2.3 (drawn after first dose enoxaparin). Baseline H&H, platelets within normal limits.   Patient is in acute renal failure with oliguria.   Goal of Therapy:  Heparin level 0.3-0.7 units/ml Monitor platelets by anticoagulation protocol: Yes   Plan:  03/24 @ 2200 HL 0.74 slightly supratherapeutic. Will decrease rate to 950 units/hr and will recheck HL w/ am labs CBC stable will continue to monitor.  4/24, PharmD, BCPS Clinical Pharmacist 02/15/2020,10:36 PM

## 2020-02-15 NOTE — Consult Note (Signed)
ANTICOAGULATION CONSULT NOTE - Initial Consult  Pharmacy Consult for Heparin infusion Indication: DVT  No Known Allergies  Patient Measurements: Height: 5\' 11"  (180.3 cm) Weight: 200 lb (90.7 kg) IBW/kg (Calculated) : 75.3 Heparin Dosing Weight: 90.7 kg  Vital Signs: Temp: 98 F (36.7 C) (03/24 0400) Temp Source: Oral (03/24 0400)  Labs: Recent Labs    02/13/20 1503 02/13/20 1503 02/14/20 0109 02/14/20 0521 02/15/20 0005 02/15/20 0235  HGB 14.3   < > 14.3  --   --  14.3  HCT 44.9  --  46.5  --   --  45.3  PLT 194  --  166  --   --  169  APTT  --   --  45*  --   --   --   LABPROT  --   --  24.9*  --   --   --   INR  --   --  2.3*  --   --   --   HEPARINUNFRC  --   --   --   --   --  0.76*  CREATININE 1.90*   < > 3.17* 3.06*  --  4.56*  TROPONINIHS 30*  --   --   --  172* 165*   < > = values in this interval not displayed.    Estimated Creatinine Clearance: 19.1 mL/min (A) (by C-G formula based on SCr of 4.56 mg/dL (H)).   Medical History: Past Medical History:  Diagnosis Date  . Hypertension   . Migraines     Medications:  -No anticoagulation prior to admission per chart -Last dose therapeutic enoxaparin 3/23 @ 0637  Assessment: Patient is a 64 y/o M with a history of hypertension, cocaine, tobacco, and alcohol use who presented to [facility] ED with new onset CHF and atrial flutter and subsequently went into cardiac arrest. Patient is intubated and mechanically ventilated for respiratory failure. Found to have multiple lower extremity DVTs on doppler. Initially started on therapeutic enoxaparin which was discontinued secondary to worsening renal function. Pharmacy has been consulted to initiate heparin infusion for treatment of VTE.   Baseline aPTT 45, INR 2.3 (drawn after first dose enoxaparin). Baseline H&H, platelets within normal limits.   Given worsening renal function (Scr 1.90 >> 3.06) expect impaired clearance of enoxaparin.  Goal of Therapy:  Heparin  level 0.3-0.7 units/ml Monitor platelets by anticoagulation protocol: Yes   Plan:  03/24 @ 0500 HL 0.76. Will continue to hold off on starting heparin and will check HL in 12 hours, if HL < 0.70 will start heparin drip. Will continue to monitor.  4/24, PharmD, BCPS Clinical Pharmacist 02/15/2020,6:51 AM

## 2020-02-15 NOTE — Progress Notes (Signed)
[facility] Encounter Note  Patient: Kendrell Lottman / Admit Date: 02/13/2020 / Date of Encounter: 02/15/2020, 1:06 PM   Subjective: No significant change from admission with cardiopulmonary arrest due to bilateral deep venous thrombosis and pulmonary embolism with atrial flutter now in normal rhythm and severe congestive heart failure biventricular Echocardiogram showing severe LV systolic dysfunction with ejection fraction of 20% severe biventricular enlargement with severe right ventricular enlargement and dysfunction likely due to pulmonary emboli and pulmonary hypertension now with failure Review of Systems:  Objective: Telemetry: Normal sinus rhythm Physical Exam: Blood pressure 93/71, pulse 86, temperature 98.3 F (36.8 C), temperature source Oral, resp. rate (!) 24, height _0  (1.803 m), weight 90.7 kg, SpO2 97 %. Body mass index is 27.89 kg/m. General: Well developed, well nourished, in no acute distress. Head: Normocephalic, atraumatic, sclera non-icteric, no xanthomas, nares are without discharge. Neck: No apparent masses Lungs: Normal respirations with few wheezes, some rhonchi, no rales , basilar crackles   Heart: Regular rate and rhythm, normal S1 S2, no murmur, no rub, no gallop, PMI is normal size and placement, carotid upstroke normal without bruit, jugular venous pressure normal Abdomen: Soft,   non-distended with normoactive bowel sounds. No hepatosplenomegaly. Abdominal aorta is normal size without bruit Extremities: 1+ edema, no clubbing, no cyanosis, no ulcers,  Peripheral: 2+ radial, 2+ femoral, 2+ dorsal pedal pulses Neuro: Is not alert and oriented. Moves all extremities spontaneously. Psych: Does not responds to questions appropriately with a normal affect.   Intake/Output Summary (Last 24 hours) at 02/15/2020 1306 Last data filed at 02/15/2020 1203 Gross per 24 hour  Intake 6616.03 ml  Output 120 ml  Net 6496.03 ml    Inpatient  Medications:  . budesonide (PULMICORT) nebulizer solution  0.5 mg Nebulization BID  . chlorhexidine gluconate (MEDLINE KIT)  15 mL Mouth Rinse BID  . Chlorhexidine Gluconate Cloth  6 each Topical Daily  . folic acid  1 mg Intravenous Daily  . ipratropium-albuterol  3 mL Nebulization Q6H  . mouth rinse  15 mL Mouth Rinse 10 times per day  . methylPREDNISolone (SOLU-MEDROL) injection  40 mg Intravenous Q12H  . multivitamin with minerals  1 tablet Oral Daily  . sodium chloride flush  10-40 mL Intracatheter Q12H  . thiamine injection  100 mg Intravenous Daily   Infusions:  . amiodarone 30 mg/hr (02/15/20 1203)  . ampicillin-sulbactam (UNASYN) IV 3 g (02/15/20 1243)  . famotidine (PEPCID) IV Stopped (02/14/20 2204)  . feeding supplement (VITAL AF 1.2 CAL) 1,000 mL (02/15/20 1203)  . fentaNYL infusion INTRAVENOUS 150 mcg/hr (02/15/20 1203)  . heparin    . norepinephrine (LEVOPHED) Adult infusion 3 mcg/min (02/15/20 1203)  . sodium bicarbonate 150 mEq in dextrose 5% 1000 mL 100 mL/hr at 02/15/20 1203    Labs: Recent Labs    02/14/20 0109 02/14/20 0109 02/14/20 0521 02/15/20 0235  NA 129*   < > 130* 134*  K 5.2*   < > 4.3 4.8  CL 90*   < > 89* 87*  CO2 16*   < > 25 23  GLUCOSE 191*   < > 311* 181*  BUN 50*   < > 54* 65*  CREATININE 3.17*   < > 3.06* 4.56*  CALCIUM 8.5*   < > 7.8* 7.3*  MG 2.6*  --   --  2.1  PHOS 11.3*  --   --  11.4*   < > = values in this interval not displayed.   Recent Labs  02/14/20 0109 02/15/20 0235  AST 157* 2,752*  ALT 107* 934*  ALKPHOS 154* 130*  BILITOT 4.3* 3.3*  PROT 7.5 6.6  ALBUMIN 3.0* 2.8*   Recent Labs    02/14/20 0109 02/15/20 0235  WBC 12.8* 21.4*  NEUTROABS 9.6* 18.5*  HGB 14.3 14.3  HCT 46.5 45.3  MCV 98.9 96.2  PLT 166 169   No results for input(s): CKTOTAL, CKMB, TROPONINI in the last 72 hours. Invalid input(s): POCBNP No results for input(s): HGBA1C in the last 72 hours.   Weights: Filed Weights   02/13/20 1459   Weight: 90.7 kg     Radiology/Studies:  DG Chest 2 View  Result Date: 02/13/2020 CLINICAL DATA:  Progressive shortness of breath over last 3 weeks, atrial flutter EXAM: CHEST - 2 VIEW COMPARISON:  04/06/2018 FINDINGS: Frontal and lateral views of the chest demonstrate an enlarged cardiac silhouette. Stable atherosclerosis of the aortic arch. There is diffuse ground-glass airspace disease throughout the lungs. No effusion or pneumothorax. Lungs are hyperinflated. No acute bony abnormalities. IMPRESSION: 1. Diffuse ground-glass airspace disease most compatible with edema given clinical presentation. 2. Cardiomegaly. 3. Atherosclerosis. Electronically Signed   By: Randa Ngo M.D.   On: 02/13/2020 15:41   US Venous Img Lower Bilateral  Result Date: 02/13/2020 CLINICAL DATA:  Bilateral lower extremity pain and swelling for several months EXAM: BILATERAL LOWER EXTREMITY VENOUS DOPPLER ULTRASOUND TECHNIQUE: Gray-scale sonography with graded compression, as well as color Doppler and duplex ultrasound were performed to evaluate the lower extremity deep venous systems from the level of the common femoral vein and including the common femoral, femoral, profunda femoral, popliteal and calf veins including the posterior tibial, peroneal and gastrocnemius veins when visible. The superficial great saphenous vein was also interrogated. Spectral Doppler was utilized to evaluate flow at rest and with distal augmentation maneuvers in the common femoral, femoral and popliteal veins. COMPARISON:  None. FINDINGS: RIGHT LOWER EXTREMITY Common Femoral Vein: No evidence of thrombus. Normal compressibility, respiratory phasicity and response to augmentation. Saphenofemoral Junction: No evidence of thrombus. Normal compressibility and flow on color Doppler imaging. Profunda Femoral Vein: No evidence of thrombus. Normal compressibility and flow on color Doppler imaging. Femoral Vein: No evidence of thrombus. Normal  compressibility, respiratory phasicity and response to augmentation. Popliteal Vein: Occlusive thrombus throughout the right popliteal vein. Calf Veins: Occlusive thrombus within the right posterior tibial and peroneal veins. Superficial Great Saphenous Vein: No evidence of thrombus. Normal compressibility. Other Findings:  None. LEFT LOWER EXTREMITY Common Femoral Vein: No evidence of thrombus. Normal compressibility, respiratory phasicity and response to augmentation. Saphenofemoral Junction: No evidence of thrombus. Normal compressibility and flow on color Doppler imaging. Profunda Femoral Vein: No evidence of thrombus. Normal compressibility and flow on color Doppler imaging. Femoral Vein: No evidence of thrombus. Normal compressibility, respiratory phasicity and response to augmentation. Popliteal Vein: Occlusive thrombus within the left popliteal vein. Calf Veins: There is occlusive thrombus within the left anterior peroneal vein. Superficial Great Saphenous Vein: No evidence of thrombus. Normal compressibility. Other Findings:  None. IMPRESSION: 1. Occlusive thrombus within the right popliteal vein, posterior tibial vein, and peroneal veins. 2. Occlusive thrombus within the left popliteal vein and left anterior peroneal vein. Electronically Signed   By: Randa Ngo M.D.   On: 02/13/2020 21:41   DG Chest Port 1 View  Result Date: 02/14/2020 CLINICAL DATA:  Hypoxia EXAM: PORTABLE CHEST 1 VIEW COMPARISON:  02/14/2020 FINDINGS: Left central line tip at the cavoatrial junction. Endotracheal tube in stable position. NG  tube is in the stomach. Cardiomegaly. Vascular congestion. Layering bilateral effusions with increasing bibasilar atelectasis. No acute bony abnormality. No pneumothorax. IMPRESSION: Cardiomegaly with vascular congestion, layering effusions and bibasilar atelectasis. Electronically Signed   By: Rolm Baptise M.D.   On: 02/14/2020 23:58   DG Chest Port 1 View  Result Date:  02/14/2020 CLINICAL DATA:  Initial evaluation for central line placement. EXAM: PORTABLE CHEST 1 VIEW COMPARISON:  Prior radiograph from 02/13/2020. FINDINGS: Endotracheal tube remains in place with tip positioned 2.4 cm above the carina. Interval placement of left IJ approach central venous catheter with tip overlying the cavoatrial junction. Enteric tube courses into the abdomen. Defibrillator pad overlies the left chest. Stable cardiomegaly. Mediastinal silhouette unchanged. Aortic atherosclerosis. Lungs hypoinflated. No pneumothorax. Probable diffuse pulmonary interstitial congestion/edema, grossly stable. No visible pleural effusion. No new consolidative opacity. Osseous structures unchanged. IMPRESSION: 1. Interval placement of left IJ approach central venous catheter with tip overlying the cavoatrial junction. Remaining support apparatus in satisfactory position. 2. Stable cardiomegaly with probable mild diffuse pulmonary interstitial edema. Electronically Signed   By: Jeannine Boga M.D.   On: 02/14/2020 01:04   DG Chest Portable 1 View  Result Date: 02/13/2020 CLINICAL DATA:  Intubation EXAM: PORTABLE CHEST 1 VIEW COMPARISON:  02/13/2020 FINDINGS: Endotracheal tube is 2.5 cm above the carina. Cardiomegaly. Aortic calcifications. Bilateral airspace opacities have increased since prior study, likely edema. No effusions or acute bony abnormality. IMPRESSION: Endotracheal tube 2.5 cm above the carina. Cardiomegaly with vascular congestion and bilateral interstitial/airspace disease, likely edema. Electronically Signed   By: Rolm Baptise M.D.   On: 02/13/2020 23:44   ECHOCARDIOGRAM COMPLETE  Result Date: 02/14/2020    ECHOCARDIOGRAM REPORT   Patient Name:   DARVIS CROFT Date of Exam: 02/14/2020 Medical Rec #:  [id number]    Height:       71.0 in Accession #:    [id number]   Weight:       200.0 lb Date of Birth:  07/24/1956   BSA:          2.109 m Patient Age:    2 years     BP:           86/71 mmHg  Patient Gender: M            HR:           89 bpm. Exam Location:  [facility] Procedure: 2D Echo, Color Doppler, Cardiac Doppler and Intracardiac            Opacification Agent Indications:     R94.31 Abnormal ECG  History:         Patient has no prior history of Echocardiogram examinations.                  Risk Factors:Hypertension and Cocaine use.  Sonographer:     Charmayne Sheer RDCS (AE) Referring Phys:  [id number] [facility] AMERY Diagnosing Phys: Serafina Royals MD  Sonographer Comments: Echo performed with patient supine and on artificial respirator and suboptimal apical window. IMPRESSIONS  1. Left ventricular ejection fraction, by estimation, is <20%. The left ventricle has severely decreased function. The left ventricle demonstrates global hypokinesis. The left ventricular internal cavity size was moderately dilated. Left ventricular diastolic parameters were normal.  2. Right ventricular systolic function is severely reduced. The right ventricular size is mildly enlarged. There is normal pulmonary artery systolic pressure.  3. Left atrial size was mildly dilated.  4. Right atrial size was mildly dilated.  5. The mitral  valve is normal in structure. Moderate mitral valve regurgitation.  6. Tricuspid valve regurgitation is moderate.  7. The aortic valve is normal in structure. Aortic valve regurgitation is trivial. FINDINGS  Left Ventricle: Left ventricular ejection fraction, by estimation, is <20%. The left ventricle has severely decreased function. The left ventricle demonstrates global hypokinesis. Definity contrast agent was given IV to delineate the left ventricular endocardial borders. The left ventricular internal cavity size was moderately dilated. There is no left ventricular hypertrophy. Left ventricular diastolic parameters were normal. Right Ventricle: The right ventricular size is mildly enlarged. No increase in right ventricular wall thickness. Right ventricular systolic function is severely reduced. There is  normal pulmonary artery systolic pressure. The tricuspid regurgitant velocity is 1.75 m/s, and with an assumed right atrial pressure of 10 mmHg, the estimated right ventricular systolic pressure is 17.9 mmHg. Left Atrium: Left atrial size was mildly dilated. Right Atrium: Right atrial size was mildly dilated. Pericardium: There is no evidence of pericardial effusion. Mitral Valve: The mitral valve is normal in structure. Moderate mitral valve regurgitation. MV peak gradient, 5.4 mmHg. The mean mitral valve gradient is 2.0 mmHg. Tricuspid Valve: The tricuspid valve is normal in structure. Tricuspid valve regurgitation is moderate. Aortic Valve: The aortic valve is normal in structure. Aortic valve regurgitation is trivial. Aortic valve mean gradient measures 1.0 mmHg. Aortic valve peak gradient measures 1.4 mmHg. Aortic valve area, by VTI measures 3.94 cm. Pulmonic Valve: The pulmonic valve was normal in structure. Pulmonic valve regurgitation is mild. Aorta: The aortic root and ascending aorta are structurally normal, with no evidence of dilitation. IAS/Shunts: No atrial level shunt detected by color flow Doppler.  LEFT VENTRICLE PLAX 2D LVIDd:         6.05 cm      Diastology LVIDs:         4.47 cm      LV e' lateral:   7.07 cm/s LV PW:         0.82 cm      LV E/e' lateral: 12.5 LV IVS:        0.72 cm      LV e' medial:    4.13 cm/s LVOT diam:     2.30 cm      LV E/e' medial:  21.4 LV SV:         25 LV SV Index:   12 LVOT Area:     4.15 cm  LV Volumes (MOD) LV vol d, MOD A2C: 136.0 ml LV vol d, MOD A4C: 128.0 ml LV vol s, MOD A2C: 92.9 ml LV vol s, MOD A4C: 84.4 ml LV SV MOD A2C:     43.1 ml LV SV MOD A4C:     128.0 ml LV SV MOD BP:      44.6 ml RIGHT VENTRICLE RV Basal diam:  4.69 cm LEFT ATRIUM             Index       RIGHT ATRIUM           Index LA diam:        4.10 cm 1.94 cm/m  RA Area:     22.50 cm LA Vol (A2C):   86.2 ml 40.88 ml/m RA Volume:   74.40 ml  35.28 ml/m LA Vol (A4C):   57.2 ml 27.13 ml/m LA  Biplane Vol: 67.5 ml 32.01 ml/m  AORTIC VALVE  PULMONIC VALVE AV Area (Vmax):    3.23 cm    PV Vmax:       0.52 m/s AV Area (Vmean):   3.43 cm    PV Vmean:      33.100 cm/s AV Area (VTI):     3.94 cm    PV VTI:        0.065 m AV Vmax:           58.10 cm/s  PV Peak grad:  1.1 mmHg AV Vmean:          35.100 cm/s PV Mean grad:  1.0 mmHg AV VTI:            0.063 m AV Peak Grad:      1.4 mmHg AV Mean Grad:      1.0 mmHg LVOT Vmax:         45.10 cm/s LVOT Vmean:        29.000 cm/s LVOT VTI:          0.060 m LVOT/AV VTI ratio: 0.95  AORTA Ao Root diam: 3.20 cm MITRAL VALVE               TRICUSPID VALVE MV Area (PHT): 3.99 cm    TR Peak grad:   12.2 mmHg MV Peak grad:  5.4 mmHg    TR Vmax:        175.00 cm/s MV Mean grad:  2.0 mmHg MV Vmax:       1.16 m/s    SHUNTS MV Vmean:      61.2 cm/s   Systemic VTI:  0.06 m MV Decel Time: 190 msec    Systemic Diam: 2.30 cm MV E velocity: 88.30 cm/s MV A velocity: 45.60 cm/s MV E/A ratio:  1.94 Serafina Royals MD Electronically signed by Serafina Royals MD Signature Date/Time: 02/14/2020/12:30:43 PM    Final      Assessment and Recommendation  64 y.o. male with acute bilateral DVT PE chronic kidney disease with acute cardiopulmonary arrest with biventricular failure atrial flutter now in normal rhythm and severe LV systolic dysfunction 1.  Continue supportive care for above as able.  Very poor prognosis due to severe by ventricular failure multifactorial in nature and multiorgan failure as well.  Signed, Serafina Royals M.D. FACC

## 2020-02-16 ENCOUNTER — Inpatient Hospital Stay: Payer: Medicaid Other

## 2020-02-16 DIAGNOSIS — K7290 Hepatic failure, unspecified without coma: Secondary | ICD-10-CM | POA: Diagnosis present

## 2020-02-16 DIAGNOSIS — J9601 Acute respiratory failure with hypoxia: Secondary | ICD-10-CM | POA: Diagnosis present

## 2020-02-16 DIAGNOSIS — K7200 Acute and subacute hepatic failure without coma: Secondary | ICD-10-CM

## 2020-02-16 DIAGNOSIS — I469 Cardiac arrest, cause unspecified: Secondary | ICD-10-CM | POA: Diagnosis present

## 2020-02-16 LAB — COMPREHENSIVE METABOLIC PANEL
ALT: 1261 U/L — ABNORMAL HIGH (ref 0–44)
AST: 2050 U/L — ABNORMAL HIGH (ref 15–41)
Albumin: 2.6 g/dL — ABNORMAL LOW (ref 3.5–5.0)
Alkaline Phosphatase: 118 U/L (ref 38–126)
Anion gap: 22 — ABNORMAL HIGH (ref 5–15)
BUN: 74 mg/dL — ABNORMAL HIGH (ref 8–23)
CO2: 28 mmol/L (ref 22–32)
Calcium: 6.9 mg/dL — ABNORMAL LOW (ref 8.9–10.3)
Chloride: 83 mmol/L — ABNORMAL LOW (ref 98–111)
Creatinine, Ser: 5.24 mg/dL — ABNORMAL HIGH (ref 0.61–1.24)
GFR calc Af Amer: 12 mL/min — ABNORMAL LOW (ref >60)
GFR calc non Af Amer: 11 mL/min — ABNORMAL LOW (ref >60)
Glucose, Bld: 189 mg/dL — ABNORMAL HIGH (ref 70–99)
Potassium: 4.5 mmol/L (ref 3.5–5.1)
Sodium: 133 mmol/L — ABNORMAL LOW (ref 135–145)
Total Bilirubin: 3 mg/dL — ABNORMAL HIGH (ref 0.3–1.2)
Total Protein: 6.3 g/dL — ABNORMAL LOW (ref 6.5–8.1)

## 2020-02-16 LAB — CBC WITH DIFFERENTIAL/PLATELET
Abs Immature Granulocytes: 0.21 10*3/uL — ABNORMAL HIGH (ref 0.00–0.07)
Basophils Absolute: 0 10*3/uL (ref 0.0–0.1)
Basophils Relative: 0 %
Eosinophils Absolute: 0 10*3/uL (ref 0.0–0.5)
Eosinophils Relative: 0 %
HCT: 43.4 % (ref 39.0–52.0)
Hemoglobin: 14.2 g/dL (ref 13.0–17.0)
Immature Granulocytes: 1 %
Lymphocytes Relative: 7 %
Lymphs Abs: 1.3 10*3/uL (ref 0.7–4.0)
MCH: 30.1 pg (ref 26.0–34.0)
MCHC: 32.7 g/dL (ref 30.0–36.0)
MCV: 91.9 fL (ref 80.0–100.0)
Monocytes Absolute: 0.8 10*3/uL (ref 0.1–1.0)
Monocytes Relative: 4 %
Neutro Abs: 15.8 10*3/uL — ABNORMAL HIGH (ref 1.7–7.7)
Neutrophils Relative %: 88 %
Platelets: 152 10*3/uL (ref 150–400)
RBC: 4.72 MIL/uL (ref 4.22–5.81)
RDW: 15.1 % (ref 11.5–15.5)
WBC: 18.1 10*3/uL — ABNORMAL HIGH (ref 4.0–10.5)
nRBC: 0.4 % — ABNORMAL HIGH (ref 0.0–0.2)

## 2020-02-16 LAB — GLUCOSE, CAPILLARY
Glucose-Capillary: 124 mg/dL — ABNORMAL HIGH (ref 70–99)
Glucose-Capillary: 132 mg/dL — ABNORMAL HIGH (ref 70–99)
Glucose-Capillary: 154 mg/dL — ABNORMAL HIGH (ref 70–99)
Glucose-Capillary: 170 mg/dL — ABNORMAL HIGH (ref 70–99)
Glucose-Capillary: 172 mg/dL — ABNORMAL HIGH (ref 70–99)
Glucose-Capillary: 187 mg/dL — ABNORMAL HIGH (ref 70–99)

## 2020-02-16 LAB — HEPARIN LEVEL (UNFRACTIONATED)
Heparin Unfractionated: 0.66 IU/mL (ref 0.30–0.70)
Heparin Unfractionated: 0.5 IU/mL (ref 0.30–0.70)

## 2020-02-16 LAB — PROCALCITONIN: Procalcitonin: 7.7 ng/mL

## 2020-02-16 MED ORDER — LACTATED RINGERS IV BOLUS
1000.0000 mL | Freq: Once | INTRAVENOUS | Status: AC
  Administered 2020-02-16: 1000 mL via INTRAVENOUS

## 2020-02-16 MED ORDER — SODIUM CHLORIDE 0.9 % IV SOLN
250.0000 mg | Freq: Three times a day (TID) | INTRAVENOUS | Status: AC
  Administered 2020-02-16 – 2020-02-19 (×9): 250 mg via INTRAVENOUS
  Filled 2020-02-16 (×11): qty 5

## 2020-02-16 MED ORDER — LACTATED RINGERS IV SOLN
INTRAVENOUS | Status: DC
  Administered 2020-02-17: 50 mL/h via INTRAVENOUS

## 2020-02-16 NOTE — Progress Notes (Signed)
CH visited pt. while rounding on ICU; pt.'s aunt in chair @ bedside; aunt is Education officer, environmental and spoke w/CH about hospital ministry.  Aunt shared she and family are hoping and praying for a 'second chance' for pt. so that he can continue to 'be an example to his sons of how to live.'  Two sons arrived as [facility] was talking to Aunt.  Sons expressed no need for support at this time.  CH remains available as needed.     02/15/20 1330  Clinical Encounter Type  Visited With Family;Patient not available  Visit Type Initial;Psychological support;Social support;Critical Care  Referral From Other (Comment) (Routine Rounding)  Spiritual Encounters  Spiritual Needs Emotional

## 2020-02-16 NOTE — Consult Note (Signed)
ANTICOAGULATION CONSULT NOTE - Initial Consult  Pharmacy Consult for Heparin infusion Indication: DVT  No Known Allergies  Patient Measurements: Height: 5\' 11"  (180.3 cm) Weight: 200 lb (90.7 kg) IBW/kg (Calculated) : 75.3 Heparin Dosing Weight: 90.7 kg  Vital Signs: Temp: 98 F (36.7 C) (03/25 0400) Temp Source: Axillary (03/25 0400) BP: 98/82 (03/24 1800) Pulse Rate: 88 (03/24 1800)  Labs: Recent Labs    02/13/20 1503 02/14/20 0109 02/14/20 0109 02/14/20 0521 02/15/20 0005 02/15/20 0235 02/15/20 0235 02/15/20 0515 02/15/20 1110 02/15/20 2208 02/16/20 0430  HGB  --  14.3   < >  --   --  14.3  --   --   --   --  14.2  HCT  --  46.5  --   --   --  45.3  --   --   --   --  43.4  PLT  --  166  --   --   --  169  --   --   --   --  152  APTT  --  45*  --   --   --   --   --   --   --   --   --   LABPROT  --  24.9*  --   --   --   --   --   --   --   --   --   INR  --  2.3*  --   --   --   --   --   --   --   --   --   HEPARINUNFRC  --   --   --   --   --  0.76*   < >  --  0.48 0.74* 0.66  CREATININE  --  3.17*  --  3.06*  --  4.56*  --   --   --   --   --   TROPONINIHS   < >  --   --   --  172* 165*  --  144*  --   --   --    < > = values in this interval not displayed.    Estimated Creatinine Clearance: 19.1 mL/min (A) (by C-G formula based on SCr of 4.56 mg/dL (H)).   Medical History: Past Medical History:  Diagnosis Date  . Hypertension   . Migraines     Medications:  -No anticoagulation prior to admission per chart -Last dose therapeutic enoxaparin 3/23 @ 0637  Assessment: Patient is a 64 y/o M with a history of hypertension, cocaine, tobacco, and alcohol use who presented to [facility] ED with new onset CHF and atrial flutter and subsequently went into cardiac arrest. Patient is intubated and mechanically ventilated for respiratory failure. Found to have multiple lower extremity DVTs on doppler. Initially started on therapeutic enoxaparin which was discontinued  secondary to worsening renal function. Pharmacy has been consulted to initiate heparin infusion for treatment of VTE.   Baseline aPTT 45, INR 2.3 (drawn after first dose enoxaparin). Baseline H&H, platelets within normal limits.   Patient is in acute renal failure with oliguria.   Goal of Therapy:  Heparin level 0.3-0.7 units/ml Monitor platelets by anticoagulation protocol: Yes   Plan:  03/25 @ 0430 HL 0.66 therapeutic. Will continue rate at 950 units/hr and will recheck HL at 1000, CBC stable will continue to monitor.  4/25, PharmD, BCPS Clinical Pharmacist 02/16/2020,5:42 AM

## 2020-02-16 NOTE — Progress Notes (Signed)
Pharmacy Antibiotic Note  Shawn Taylor is a 64 y.o. male admitted on 02/13/2020 status post cardiac arrest due to DVTs and possible PE with cardiogenic shock and multiorgan failure. WBC trending down (21.4>18.1). Patient is afebrile since admission (3/22). Possible aspiration pneumonia. Pharmacy has been consulted for unasyn dosing.  Plan: Unasyn IV 3 g Q12H Will follow Scr tomorrow, patient in ARF.    Height: 5\' 11"  (180.3 cm) Weight: 200 lb (90.7 kg) IBW/kg (Calculated) : 75.3  Temp (24hrs), Avg:98.2 F (36.8 C), Min:97.6 F (36.4 C), Max:98.9 F (37.2 C)  Recent Labs  Lab 02/13/20 1503 02/14/20 0109 02/14/20 0521 02/15/20 0235 02/16/20 0430  WBC 9.2 12.8*  --  21.4* 18.1*  CREATININE 1.90* 3.17* 3.06* 4.56* 5.24*    Estimated Creatinine Clearance: 16.6 mL/min (A) (by C-G formula based on SCr of 5.24 mg/dL (H)).    No Known Allergies  Antimicrobials this admission: Unasyn 3/24 >>   Dose adjustments this admission: n/a  Microbiology results: 3/23 BCx: Pending 3/23 MRSA PCR: Negative  Thank you for allowing pharmacy to be a part of this patient's care.  4/23 02/16/2020 11:46 AM

## 2020-02-16 NOTE — Progress Notes (Addendum)
CRITICAL CARE NOTE 64 yo male admitted s/p respiratory arrest followed by cardiac arrest, cardiogenic shock, pulmonary edema, severe metabolic acidosis, BLE DVT'sconcerning for possible pulmonary embolismsrequiring mechanical intubation and levophed gtt  SIGNIFICANT EVENTS/STUDIES: 03/22: Pt admitted to the stepdown unit due to atrial flutter with rvr, acute hypoxic respiratory failure secondary to severe metabolic acidosis and pulmonary edema 03/22: Venous US Bilateral Extremity revealed occlusive thrombus within the right popliteal vein, posterior tibial vein, and peroneal veins. Occlusive thrombus within the left popliteal vein and left anterior peroneal vein. 3/22: While awaiting bed availability in the stepdown unit pt respiratory arrested which led to cardiac arrest ROSC obtained, pt mechanically intubation, and admitted to ICU 3/23 multiorgan failure, family at bedside updated 3/24 remains critically ill, multiorgan failure 3/25 remains critically ill, multiorgan failure, seems to be following some simple commands, mostly delirious, son at bedside updated   CC  follow up respiratory failure  SUBJECTIVE Patient remains critically ill Prognosis is guarded Multiorgan failure    BP 110/74   Pulse 91   Temp 98.9 F (37.2 C) (Oral)   Resp (!) 24   Ht 5' 11"  (1.803 m)   Wt 90.7 kg   SpO2 93%   BMI 27.89 kg/m    I/O last 3 completed shifts: In: 5682.9 [I.V.:4472.4; IV Piggyback:1210.5] Out: 465 [Urine:315; Emesis/NG output:150] Total I/O In: 1414.1 [I.V.:1414.1] Out: -   SpO2: 93 % O2 Flow Rate (L/min): 15 L/min FiO2 (%): 35 %  Estimated body mass index is 27.89 kg/m as calculated from the following:   Height as of this encounter: 5' 11"  (1.803 m).   Weight as of this encounter: 90.7 kg.  SIGNIFICANT EVENTS   REVIEW OF SYSTEMS  PATIENT IS UNABLE TO PROVIDE COMPLETE REVIEW OF SYSTEMS DUE TO SEVERE CRITICAL ILLNESS    PHYSICAL  EXAMINATION:  GENERAL:critically ill appearing, +resp distress HEAD: Normocephalic, atraumatic.  EYES: Pupils equal, round, reactive to light.  No scleral icterus.  MOUTH: Moist mucosal membrane. NECK: Supple.  PULMONARY: +rhonchi, +wheezing CARDIOVASCULAR: S1 and S2. Regular rate and rhythm. No murmurs, rubs, or gallops.  GASTROINTESTINAL: Soft, nontender, -distended.  Positive bowel sounds.   MUSCULOSKELETAL: No swelling, clubbing, or edema.  NEUROLOGIC: obtunded, GCS<8 SKIN:intact,warm,dry  MEDICATIONS: I have reviewed all medications and confirmed regimen as documented   CULTURE RESULTS   Recent Results (from the past 240 hour(s))  Respiratory Panel by RT PCR (Flu A&B, Covid) - Nasopharyngeal Swab     Status: None   Collection Time: 02/13/20  4:21 PM   Specimen: Nasopharyngeal Swab  Result Value Ref Range Status   SARS Coronavirus 2 by RT PCR NEGATIVE NEGATIVE Final    Comment: (NOTE) SARS-CoV-2 target nucleic acids are NOT DETECTED. The SARS-CoV-2 RNA is generally detectable in upper respiratoy specimens during the acute phase of infection. The lowest concentration of SARS-CoV-2 viral copies this assay can detect is 131 copies/mL. A negative result does not preclude SARS-Cov-2 infection and should not be used as the sole basis for treatment or other patient management decisions. A negative result may occur with  improper specimen collection/handling, submission of specimen other than nasopharyngeal swab, presence of viral mutation(s) within the areas targeted by this assay, and inadequate number of viral copies (<131 copies/mL). A negative result must be combined with clinical observations, patient history, and epidemiological information. The expected result is Negative. Fact Sheet for Patients:  PinkCheek.be Fact Sheet for Healthcare Providers:  GravelBags.it This test is not yet ap proved or cleared by the  [place]  [place] FDA and  has been authorized for detection and/or diagnosis of SARS-CoV-2 by FDA under an Emergency Use Authorization (EUA). This EUA will remain  in effect (meaning this test can be used) for the duration of the COVID-19 declaration under Section 564(b)(1) of the Act, 21 U.S.C. section 360bbb-3(b)(1), unless the authorization is terminated or revoked sooner.    Influenza A by PCR NEGATIVE NEGATIVE Final   Influenza B by PCR NEGATIVE NEGATIVE Final    Comment: (NOTE) The Xpert Xpress SARS-CoV-2/FLU/RSV assay is intended as an aid in  the diagnosis of influenza from Nasopharyngeal swab specimens and  should not be used as a sole basis for treatment. Nasal washings and  aspirates are unacceptable for Xpert Xpress SARS-CoV-2/FLU/RSV  testing. Fact Sheet for Patients: PinkCheek.be Fact Sheet for Healthcare Providers: GravelBags.it This test is not yet approved or cleared by the [place] FDA and  has been authorized for detection and/or diagnosis of SARS-CoV-2 by  FDA under an Emergency Use Authorization (EUA). This EUA will remain  in effect (meaning this test can be used) for the duration of the  Covid-19 declaration under Section 564(b)(1) of the Act, 21  U.S.C. section 360bbb-3(b)(1), unless the authorization is  terminated or revoked. Performed at [facility], [address] [place], [place] [address]   CULTURE, BLOOD (ROUTINE X 2) w Reflex to ID Panel     Status: None (Preliminary result)   Collection Time: 02/14/20  3:42 AM   Specimen: BLOOD  Result Value Ref Range Status   Specimen Description BLOOD LEFT HAND  Final   Special Requests   Final    BOTTLES DRAWN AEROBIC AND ANAEROBIC Blood Culture adequate volume   Culture   Final    NO GROWTH 2 DAYS Performed at [facility], [address] [place], [place] [address]    Report Status PENDING  Incomplete  CULTURE, BLOOD  (ROUTINE X 2) w Reflex to ID Panel     Status: None (Preliminary result)   Collection Time: 02/14/20  3:42 AM   Specimen: BLOOD  Result Value Ref Range Status   Specimen Description BLOOD RIGHT HAND  Final   Special Requests   Final    BOTTLES DRAWN AEROBIC AND ANAEROBIC Blood Culture results may not be optimal due to an inadequate volume of blood received in culture bottles   Culture   Final    NO GROWTH 2 DAYS Performed at [facility], [address] [place], [place] [address]    Report Status PENDING  Incomplete  MRSA PCR Screening     Status: None   Collection Time: 02/14/20  3:49 AM   Specimen: Nasopharyngeal  Result Value Ref Range Status   MRSA by PCR NEGATIVE NEGATIVE Final    Comment:        The GeneXpert MRSA Assay (FDA approved for NASAL specimens only), is one component of a comprehensive MRSA colonization surveillance program. It is not intended to diagnose MRSA infection nor to guide or monitor treatment for MRSA infections. Performed at [facility], [address] [place], [place] [address]   Culture, respiratory     Status: None (Preliminary result)   Collection Time: 02/15/20  9:46 AM   Specimen: Tracheal Aspirate  Result Value Ref Range Status   Specimen Description   Final    TRACHEAL ASPIRATE Performed at [facility], [address] [place],  [address]    Special Requests   Final    NONE Performed  at [facility], [address] [place], [place] [address]    Gram Stain   Final    ABUNDANT WBC PRESENT,BOTH PMN AND MONONUCLEAR FEW GRAM NEGATIVE RODS FEW GRAM POSITIVE COCCI RARE YEAST    Culture   Final    CULTURE REINCUBATED FOR BETTER GROWTH Performed at [facility], [place] [address] [place], [place] [address]    Report Status PENDING  Incomplete          IMAGING    DG Chest Port 1 View  Result Date: 02/16/2020 CLINICAL DATA:  Acute respiratory failure.  Hypoxia. EXAM: PORTABLE  CHEST 1 VIEW COMPARISON:  02/14/2020. FINDINGS: Endotracheal tube, NG tube, left IJ line in stable position. Severe cardiomegaly again noted. Diffuse bilateral interstitial prominence most consistent interstitial edema again noted. Low lung volumes with bibasilar atelectasis. Small left pleural effusion. No pneumothorax. IMPRESSION: 1.  Lines and tubes in stable position. 2. Severe cardiomegaly again noted. Diffuse bilateral interstitial prominence most consistent interstitial edema again noted. Small left pleural effusion. Exam unchanged from prior exam. 3.  Low lung volumes with bibasilar atelectasis. Electronically Signed   By: [employer]   On: 02/16/2020 05:12     Nutrition Status: Nutrition Problem: Inadequate oral intake Etiology: inability to eat Signs/Symptoms: NPO status Interventions: Tube feeding     Indwelling Urinary Catheter continued, requirement due to   Reason to continue Indwelling Urinary Catheter strict Intake/Output monitoring for hemodynamic instability   Central Line/ continued, requirement due to  Reason to continue [place] of central venous pressure or other hemodynamic parameters and poor IV access   Ventilator continued, requirement due to severe respiratory failure   Ventilator Sedation RASS 0 to -2      ASSESSMENT AND PLAN SYNOPSIS 64 year old African-American male status post cardiac arrest due to DVTs and PEs with cardiogenic shock with multiorgan failure  Severe ACUTE Hypoxic and Hypercapnic Respiratory Failure -continue Full MV support -continue Bronchodilator Therapy -Wean Fio2 and PEEP as tolerated -will perform SAT/SBT when respiratory parameters are met  ACUTE KIDNEY INJURY/Renal Failure -follow chem 7 -follow UO -continue Foley Catheter-assess need -Avoid nephrotoxic agents -Recheck creatinine   NEUROLOGY - intubated and sedated - minimal sedation to achieve a RASS goal: -1 Wake up assessment  pending  DVT's/PE-continue heparin drip High risk for bleeding   SHOCK-SEPSIS/HYPOVOLUMIC/CARDIOGENIC -use vasopressors to keep MAP>65 -follow ABG and LA -follow up cultures -emperic ABX -consider stress dose steroids -aggressive IV fluid resuscitation  CARDIAC ICU monitoring  ID -continue IV abx as prescibed -follow up cultures  GI GI PROPHYLAXIS as indicated  DIET-->TF's as tolerated Constipation protocol as indicated  ENDO - will use ICU hypoglycemic\Hyperglycemia protocol if indicated   ELECTROLYTES -follow labs as needed -replace as needed -pharmacy consultation and following   DVT-on heparin drip /GI PRX ordered TRANSFUSIONS AS NEEDED MONITOR FSBS ASSESS the need for LABS as needed   Critical Care Time devoted to patient care services described in this note is 43 minutes.   Overall, patient is critically ill, prognosis is guarded.  Patient with Multiorgan failure and at high risk for cardiac arrest and death.    Corrin Parker, M.D.  [facility]  Medical Director [place] Director [facility] Cardio-Pulmonary Department

## 2020-02-16 NOTE — Consult Note (Signed)
ANTICOAGULATION CONSULT NOTE - Initial Consult  Pharmacy Consult for Heparin infusion Indication: DVT  No Known Allergies  Patient Measurements: Height: 5\' 11"  (180.3 cm) Weight: 200 lb (90.7 kg) IBW/kg (Calculated) : 75.3 Heparin Dosing Weight: 90.7 kg  Vital Signs: Temp: 98.9 F (37.2 C) (03/25 0730) Temp Source: Oral (03/25 0730) BP: 93/80 (03/25 1100) Pulse Rate: 98 (03/25 1033)  Labs: Recent Labs    02/13/20 1503 02/14/20 0109 02/14/20 0109 02/14/20 0521 02/15/20 0005 02/15/20 0235 02/15/20 0515 02/15/20 1110 02/15/20 2208 02/16/20 0430 02/16/20 1000  HGB  --  14.3   < >  --   --  14.3  --   --   --  14.2  --   HCT  --  46.5  --   --   --  45.3  --   --   --  43.4  --   PLT  --  166  --   --   --  169  --   --   --  152  --   APTT  --  45*  --   --   --   --   --   --   --   --   --   LABPROT  --  24.9*  --   --   --   --   --   --   --   --   --   INR  --  2.3*  --   --   --   --   --   --   --   --   --   HEPARINUNFRC  --   --   --   --   --  0.76*  --    < > 0.74* 0.66 0.50  CREATININE  --  3.17*   < > 3.06*  --  4.56*  --   --   --  5.24*  --   TROPONINIHS   < >  --   --   --  172* 165* 144*  --   --   --   --    < > = values in this interval not displayed.    Estimated Creatinine Clearance: 16.6 mL/min (A) (by C-G formula based on SCr of 5.24 mg/dL (H)).   Medical History: Past Medical History:  Diagnosis Date  . Hypertension   . Migraines     Medications:  -No anticoagulation prior to admission per chart -Last dose therapeutic enoxaparin 3/23 @ 0637  Assessment: Patient is a 64 y/o M with a history of hypertension, cocaine, tobacco, and alcohol use who presented to [facility] ED with new onset CHF and atrial flutter and subsequently went into cardiac arrest. Patient is intubated and mechanically ventilated for respiratory failure. Found to have multiple lower extremity DVTs on doppler. Initially started on therapeutic enoxaparin which was discontinued  secondary to worsening renal function. Pharmacy has been consulted to initiate heparin infusion for treatment of VTE.   Baseline aPTT 45, INR 2.3 (drawn after first dose enoxaparin). Baseline H&H, platelets within normal limits.   Patient is in acute renal failure with oliguria.   Goal of Therapy:  Heparin level 0.3-0.7 units/ml Monitor platelets by anticoagulation protocol: Yes   Plan:  -03/25 @ 1000 HL 0.50, therapeutic x2. Will continue rate at 950 units/hr  -HL tomorrow morning  -CBC stable will continue to monitor.  10-18-1993 Pharmacy Resident 02/16/2020,11:22 AM

## 2020-02-17 ENCOUNTER — Inpatient Hospital Stay: Payer: Medicaid Other

## 2020-02-17 LAB — GLUCOSE, CAPILLARY
Glucose-Capillary: 128 mg/dL — ABNORMAL HIGH (ref 70–99)
Glucose-Capillary: 148 mg/dL — ABNORMAL HIGH (ref 70–99)
Glucose-Capillary: 152 mg/dL — ABNORMAL HIGH (ref 70–99)
Glucose-Capillary: 155 mg/dL — ABNORMAL HIGH (ref 70–99)
Glucose-Capillary: 162 mg/dL — ABNORMAL HIGH (ref 70–99)
Glucose-Capillary: 168 mg/dL — ABNORMAL HIGH (ref 70–99)

## 2020-02-17 LAB — COMPREHENSIVE METABOLIC PANEL
ALT: 1080 U/L — ABNORMAL HIGH (ref 0–44)
AST: 1069 U/L — ABNORMAL HIGH (ref 15–41)
Albumin: 2.4 g/dL — ABNORMAL LOW (ref 3.5–5.0)
Alkaline Phosphatase: 116 U/L (ref 38–126)
Anion gap: 15 (ref 5–15)
BUN: 83 mg/dL — ABNORMAL HIGH (ref 8–23)
CO2: 33 mmol/L — ABNORMAL HIGH (ref 22–32)
Calcium: 7.8 mg/dL — ABNORMAL LOW (ref 8.9–10.3)
Chloride: 87 mmol/L — ABNORMAL LOW (ref 98–111)
Creatinine, Ser: 4.73 mg/dL — ABNORMAL HIGH (ref 0.61–1.24)
GFR calc Af Amer: 14 mL/min — ABNORMAL LOW (ref >60)
GFR calc non Af Amer: 12 mL/min — ABNORMAL LOW (ref >60)
Glucose, Bld: 148 mg/dL — ABNORMAL HIGH (ref 70–99)
Potassium: 4.4 mmol/L (ref 3.5–5.1)
Sodium: 135 mmol/L (ref 135–145)
Total Bilirubin: 3.5 mg/dL — ABNORMAL HIGH (ref 0.3–1.2)
Total Protein: 6 g/dL — ABNORMAL LOW (ref 6.5–8.1)

## 2020-02-17 LAB — CBC WITH DIFFERENTIAL/PLATELET
Abs Immature Granulocytes: 0.14 10*3/uL — ABNORMAL HIGH (ref 0.00–0.07)
Basophils Absolute: 0 10*3/uL (ref 0.0–0.1)
Basophils Relative: 0 %
Eosinophils Absolute: 0 10*3/uL (ref 0.0–0.5)
Eosinophils Relative: 0 %
HCT: 39.8 % (ref 39.0–52.0)
Hemoglobin: 13.5 g/dL (ref 13.0–17.0)
Immature Granulocytes: 1 %
Lymphocytes Relative: 9 %
Lymphs Abs: 1.1 10*3/uL (ref 0.7–4.0)
MCH: 30.8 pg (ref 26.0–34.0)
MCHC: 33.9 g/dL (ref 30.0–36.0)
MCV: 90.7 fL (ref 80.0–100.0)
Monocytes Absolute: 0.6 10*3/uL (ref 0.1–1.0)
Monocytes Relative: 4 %
Neutro Abs: 10.6 10*3/uL — ABNORMAL HIGH (ref 1.7–7.7)
Neutrophils Relative %: 86 %
Platelets: 121 10*3/uL — ABNORMAL LOW (ref 150–400)
RBC: 4.39 MIL/uL (ref 4.22–5.81)
RDW: 14.6 % (ref 11.5–15.5)
WBC: 12.4 10*3/uL — ABNORMAL HIGH (ref 4.0–10.5)
nRBC: 0.3 % — ABNORMAL HIGH (ref 0.0–0.2)

## 2020-02-17 LAB — HEPARIN LEVEL (UNFRACTIONATED)
Heparin Unfractionated: 0.33 IU/mL (ref 0.30–0.70)
Heparin Unfractionated: 0.37 IU/mL (ref 0.30–0.70)
Heparin Unfractionated: 0.24 IU/mL — ABNORMAL LOW (ref 0.30–0.70)

## 2020-02-17 LAB — TRIGLYCERIDES: Triglycerides: 72 mg/dL (ref <150)

## 2020-02-17 LAB — CULTURE, RESPIRATORY W GRAM STAIN: Culture: NORMAL

## 2020-02-17 MED ORDER — DEXTROSE IN LACTATED RINGERS 5 % IV SOLN
INTRAVENOUS | Status: DC
  Administered 2020-02-17 – 2020-02-18 (×2): 50 mL/h via INTRAVENOUS

## 2020-02-17 MED ORDER — PROPOFOL 1000 MG/100ML IV EMUL
5.0000 ug/kg/min | INTRAVENOUS | Status: DC
  Administered 2020-02-18: 30 ug/kg/min via INTRAVENOUS
  Administered 2020-02-18: 10 ug/kg/min via INTRAVENOUS
  Administered 2020-02-18: 30 ug/kg/min via INTRAVENOUS
  Administered 2020-02-18: 20 ug/kg/min via INTRAVENOUS
  Administered 2020-02-19: 25 ug/kg/min via INTRAVENOUS
  Administered 2020-02-19 – 2020-02-20 (×5): 30 ug/kg/min via INTRAVENOUS
  Administered 2020-02-20: 20 ug/kg/min via INTRAVENOUS
  Administered 2020-02-21: 25 ug/kg/min via INTRAVENOUS
  Administered 2020-02-21: 20 ug/kg/min via INTRAVENOUS
  Filled 2020-02-17 (×13): qty 100

## 2020-02-17 MED ORDER — HEPARIN BOLUS VIA INFUSION
1300.0000 [IU] | Freq: Once | INTRAVENOUS | Status: AC
  Administered 2020-02-17: 1300 [IU] via INTRAVENOUS
  Filled 2020-02-17: qty 1300

## 2020-02-17 NOTE — Progress Notes (Addendum)
CRITICAL CARE NOTE 64 yo male admitted s/p respiratory arrest followed by cardiac arrest, cardiogenic shock, pulmonary edema, severe metabolic acidosis, BLE DVT'sconcerning for possible pulmonary embolismsrequiring mechanical intubation and levophed gtt  SIGNIFICANT EVENTS/STUDIES: 03/22: Pt admitted to the stepdown unit due to atrial flutter with rvr, acute hypoxic respiratory failure secondary to severe metabolic acidosis and pulmonary edema 03/22: Venous US Bilateral Extremity revealed occlusive thrombus within the right popliteal vein, posterior tibial vein, and peroneal veins. Occlusive thrombus within the left popliteal vein and left anterior peroneal vein. 3/22: While awaiting bed availability in the stepdown unit pt respiratory arrested which led to cardiac arrest ROSC obtained, pt mechanically intubation, and admitted to ICU 3/44mltiorgan failure, family at bedside updated EF<20%. The left  ventricle has severely decreased function. The left ventricle demonstrates  global hypokinesis.  3/24remains critically ill, multiorgan failure 3/25 remains critically ill, multiorgan failure, seems to be following some simple commands, mostly delirious, son at bedside updated   CC  follow up respiratory failure  SUBJECTIVE Patient remains critically ill Prognosis is guarded Multiorgan failure     BP 102/80 (BP Location: Left Arm)   Pulse 98   Temp 98 F (36.7 C) (Oral)   Resp (!) 24   Ht _0  (1.803 m)   Wt 90.7 kg   SpO2 98%   BMI 27.89 kg/m    I/O last 3 completed shifts: In: 4100.8 [I.V.:2819.3; IV Piggyback:1281.5] Out: 625 [Urine:575; Emesis/NG output:50] No intake/output data recorded.  SpO2: 98 % O2 Flow Rate (L/min): 15 L/min FiO2 (%): 35 %  Estimated body mass index is 27.89 kg/m as calculated from the following:   Height as of this encounter: _1  (1.803 m).   Weight as of this encounter: 90.7 kg.  SIGNIFICANT EVENTS   REVIEW OF  SYSTEMS  PATIENT IS UNABLE TO PROVIDE COMPLETE REVIEW OF SYSTEMS DUE TO SEVERE CRITICAL ILLNESS        PHYSICAL EXAMINATION:  GENERAL:critically ill appearing, +resp distress HEAD: Normocephalic, atraumatic.  EYES: Pupils equal, round, reactive to light.  No scleral icterus.  MOUTH: Moist mucosal membrane. NECK: Supple.  PULMONARY: +rhonchi, +wheezing CARDIOVASCULAR: S1 and S2. Regular rate and rhythm. No murmurs, rubs, or gallops.  GASTROINTESTINAL: Soft, nontender, -distended.  Positive bowel sounds.   MUSCULOSKELETAL: No swelling, clubbing, or edema.  NEUROLOGIC: obtunded, GCS<8 SKIN:intact,warm,dry  MEDICATIONS: I have reviewed all medications and confirmed regimen as documented   CULTURE RESULTS   Recent Results (from the past 240 hour(s))  Respiratory Panel by RT PCR (Flu A&B, Covid) - Nasopharyngeal Swab     Status: None   Collection Time: 02/13/20  4:21 PM   Specimen: Nasopharyngeal Swab  Result Value Ref Range Status   SARS Coronavirus 2 by RT PCR NEGATIVE NEGATIVE Final    Comment: (NOTE) SARS-CoV-2 target nucleic acids are NOT DETECTED. The SARS-CoV-2 RNA is generally detectable in upper respiratoy specimens during the acute phase of infection. The lowest concentration of SARS-CoV-2 viral copies this assay can detect is 131 copies/mL. A negative result does not preclude SARS-Cov-2 infection and should not be used as the sole basis for treatment or other patient management decisions. A negative result may occur with  improper specimen collection/handling, submission of specimen other than nasopharyngeal swab, presence of viral mutation(s) within the areas targeted by this assay, and inadequate number of viral copies (<131 copies/mL). A negative result must be combined with clinical observations, patient history, and epidemiological information. The expected result is Negative. Fact Sheet for Patients:  hPinkCheek.be  Fact Sheet for  Healthcare Providers:  GravelBags.it This test is not yet ap proved or cleared by the [place] FDA and  has been authorized for detection and/or diagnosis of SARS-CoV-2 by FDA under an Emergency Use Authorization (EUA). This EUA will remain  in effect (meaning this test can be used) for the duration of the COVID-19 declaration under Section 564(b)(1) of the Act, 21 U.S.C. section 360bbb-3(b)(1), unless the authorization is terminated or revoked sooner.    Influenza A by PCR NEGATIVE NEGATIVE Final   Influenza B by PCR NEGATIVE NEGATIVE Final    Comment: (NOTE) The Xpert Xpress SARS-CoV-2/FLU/RSV assay is intended as an aid in  the diagnosis of influenza from Nasopharyngeal swab specimens and  should not be used as a sole basis for treatment. Nasal washings and  aspirates are unacceptable for Xpert Xpress SARS-CoV-2/FLU/RSV  testing. Fact Sheet for Patients: PinkCheek.be Fact Sheet for Healthcare Providers: GravelBags.it This test is not yet approved or cleared by the [place] FDA and  has been authorized for detection and/or diagnosis of SARS-CoV-2 by  FDA under an Emergency Use Authorization (EUA). This EUA will remain  in effect (meaning this test can be used) for the duration of the  Covid-19 declaration under Section 564(b)(1) of the Act, 21  U.S.C. section 360bbb-3(b)(1), unless the authorization is  terminated or revoked. Performed at [facility], [address] [place], [place] [address]   CULTURE, BLOOD (ROUTINE X 2) w Reflex to ID Panel     Status: None (Preliminary result)   Collection Time: 02/14/20  3:42 AM   Specimen: BLOOD  Result Value Ref Range Status   Specimen Description BLOOD LEFT HAND  Final   Special Requests   Final    BOTTLES DRAWN AEROBIC AND ANAEROBIC Blood Culture adequate volume   Culture   Final    NO GROWTH 3 DAYS Performed at [facility], [address] [place], [place] [address]    Report Status PENDING  Incomplete  CULTURE, BLOOD (ROUTINE X 2) w Reflex to ID Panel     Status: None (Preliminary result)   Collection Time: 02/14/20  3:42 AM   Specimen: BLOOD  Result Value Ref Range Status   Specimen Description BLOOD RIGHT HAND  Final   Special Requests   Final    BOTTLES DRAWN AEROBIC AND ANAEROBIC Blood Culture results may not be optimal due to an inadequate volume of blood received in culture bottles   Culture   Final    NO GROWTH 3 DAYS Performed at [facility], [address], [place], [place] [address]    Report Status PENDING  Incomplete  MRSA PCR Screening     Status: None   Collection Time: 02/14/20  3:49 AM   Specimen: Nasopharyngeal  Result Value Ref Range Status   MRSA by PCR NEGATIVE NEGATIVE Final    Comment:        The GeneXpert MRSA Assay (FDA approved for NASAL specimens only), is one component of a comprehensive MRSA colonization surveillance program. It is not intended to diagnose MRSA infection nor to guide or monitor treatment for MRSA infections. Performed at [facility], [address] [place], [place] [address]   Culture, respiratory     Status: None   Collection Time: 02/15/20  9:46 AM   Specimen: Tracheal Aspirate  Result Value Ref Range Status   Specimen Description   Final    TRACHEAL ASPIRATE Performed at [facility], [place]  [address] [place], [place] [address]    Special Requests   Final    NONE Performed at [facility], [place], [place] [address]    Gram Stain   Final    ABUNDANT WBC PRESENT,BOTH PMN AND MONONUCLEAR FEW GRAM NEGATIVE RODS FEW GRAM POSITIVE COCCI RARE YEAST    Culture   Final    Consistent with normal respiratory flora. Performed at [facility], [place] [address], [place], [place] [address]    Report Status 02/17/2020 FINAL  Final          IMAGING    No results  found.   Nutrition Status: Nutrition Problem: Inadequate oral intake Etiology: inability to eat Signs/Symptoms: NPO status Interventions: Tube feeding     Indwelling Urinary Catheter continued, requirement due to   Reason to continue Indwelling Urinary Catheter strict Intake/Output monitoring for hemodynamic instability   Central Line/ continued, requirement due to  Reason to continue [place] of central venous pressure or other hemodynamic parameters and poor IV access   Ventilator continued, requirement due to severe respiratory failure   Ventilator Sedation RASS 0 to -2      ASSESSMENT AND PLAN SYNOPSIS  64 year old African-American male status post cardiac arrest due to DVTs and PEs with cardiogenic shock with multiorgan failure with severe acute systolic heart failure   Severe ACUTE Hypoxic and Hypercapnic Respiratory Failure -continue Full MV support -continue Bronchodilator Therapy -Wean Fio2 and PEEP as tolerated -will perform SAT/SBT when respiratory parameters are met  ACUTE SYSTOLIC CARDIAC FAILURE- EF <20% -oxygen as needed -Lasix as tolerated  ACUTE KIDNEY INJURY/Renal Failure -follow chem 7 -follow UO -continue Foley Catheter-assess need -Avoid nephrotoxic agents -Recheck creatinine    NEUROLOGY - intubated and sedated - minimal sedation to achieve a RASS goal: -1 Wake up assessment pending   SHOCK-SEPSIS/CARDIOGENIC -use vasopressors to keep MAP>65 as needed -follow ABG and LA -follow up cultures -emperic ABX -aggressive IV fluid resuscitation  CARDIAC ICU monitoring  ID -continue IV abx as prescibed -follow up cultures  DVT's/PE-continue heparin drip High risk for bleeding   GI GI PROPHYLAXIS as indicated  NUTRITIONAL STATUS Nutrition Status: Nutrition Problem: Inadequate oral intake Etiology: inability to eat Signs/Symptoms: NPO status Interventions: Tube feeding   DIET-->TF's as tolerated Constipation  protocol as indicated  ENDO - will use ICU hypoglycemic\Hyperglycemia protocol if indicated   ELECTROLYTES -follow labs as needed -replace as needed -pharmacy consultation and following   DVT/GI PRX ordered TRANSFUSIONS AS NEEDED MONITOR FSBS ASSESS the need for LABS as needed   Critical Care Time devoted to patient care services described in this note is 34 minutes.   Overall, patient is critically ill, prognosis is guarded.  Patient with Multiorgan failure and at high risk for cardiac arrest and death.    Corrin Parker, M.D.  [facility]  Medical Director [place] Director [facility] Cardio-Pulmonary Department

## 2020-02-17 NOTE — Consult Note (Signed)
ANTICOAGULATION CONSULT NOTE - Initial Consult  Pharmacy Consult for Heparin infusion Indication: DVT  No Known Allergies  Patient Measurements: Height: 5\' 11"  (180.3 cm) Weight: 200 lb (90.7 kg) IBW/kg (Calculated) : 75.3 Heparin Dosing Weight: 90.7 kg  Vital Signs: Temp: 98 F (36.7 C) (03/26 0000) Temp Source: Oral (03/26 0000) BP: 102/80 (03/25 1900)  Labs: Recent Labs     0000 02/15/20 0005 02/15/20 0235 02/15/20 0515 02/15/20 1110 02/16/20 0430 02/16/20 1000 02/17/20 0450  HGB   < >  --  14.3  --   --  14.2  --  13.5  HCT  --   --  45.3  --   --  43.4  --  39.8  PLT  --   --  169  --   --  152  --  121*  HEPARINUNFRC  --   --  0.76*  --    < > 0.66 0.50 0.33  CREATININE  --   --  4.56*  --   --  5.24*  --  4.73*  TROPONINIHS  --  172* 165* 144*  --   --   --   --    < > = values in this interval not displayed.    Estimated Creatinine Clearance: 18.4 mL/min (A) (by C-G formula based on SCr of 4.73 mg/dL (H)).   Medical History: Past Medical History:  Diagnosis Date  . Hypertension   . Migraines     Medications:  -No anticoagulation prior to admission per chart -Last dose therapeutic enoxaparin 3/23 @ 0637  Assessment: Patient is a 64 y/o M with a history of hypertension, cocaine, tobacco, and alcohol use who presented to [facility] ED with new onset CHF and atrial flutter and subsequently went into cardiac arrest. Patient is intubated and mechanically ventilated for respiratory failure. Found to have multiple lower extremity DVTs on doppler. Initially started on therapeutic enoxaparin which was discontinued secondary to worsening renal function. Pharmacy has been consulted to initiate heparin infusion for treatment of VTE.   Baseline aPTT 45, INR 2.3 (drawn after first dose enoxaparin). Baseline H&H, platelets within normal limits.   Patient is in acute renal failure with oliguria.   Goal of Therapy:  Heparin level 0.3-0.7 units/ml Monitor platelets by  anticoagulation protocol: Yes   Plan:  03/26 @ 0500 HL 0.33 therapeutic, but trending down. Will increase rate slightly to 1000 units/hr and will recheck HL at 1200 and continue to monitor.  4/26, PharmD, BCPS Clinical Pharmacist 02/17/2020,5:52 AM

## 2020-02-17 NOTE — Consult Note (Signed)
ANTICOAGULATION CONSULT NOTE - Initial Consult  Pharmacy Consult for Heparin infusion Indication: DVT  No Known Allergies  Patient Measurements: Height: 5\' 11"  (180.3 cm) Weight: 200 lb (90.7 kg) IBW/kg (Calculated) : 75.3 Heparin Dosing Weight: 90.7 kg  Vital Signs: Temp: 98 F (36.7 C) (03/26 2000) Temp Source: Oral (03/26 2000) BP: 117/77 (03/26 2000) Pulse Rate: 70 (03/26 2000)  Labs: Recent Labs     0000 02/15/20 0005 02/15/20 0235 02/15/20 0515 02/15/20 1110 02/16/20 0430 02/16/20 1000 02/17/20 0450 02/17/20 1235 02/17/20 2010  HGB   < >  --  14.3  --   --  14.2  --  13.5  --   --   HCT  --   --  45.3  --   --  43.4  --  39.8  --   --   PLT  --   --  169  --   --  152  --  121*  --   --   HEPARINUNFRC  --   --  0.76*  --    < > 0.66   < > 0.33 0.37 0.24*  CREATININE  --   --  4.56*  --   --  5.24*  --  4.73*  --   --   TROPONINIHS  --  172* 165* 144*  --   --   --   --   --   --    < > = values in this interval not displayed.    Estimated Creatinine Clearance: 18.4 mL/min (A) (by C-G formula based on SCr of 4.73 mg/dL (H)).   Medical History: Past Medical History:  Diagnosis Date  . Hypertension   . Migraines     Medications:  -No anticoagulation prior to admission per chart -Last dose therapeutic enoxaparin 3/23 @ 0637  Assessment: Patient is a 64 y/o M with a history of hypertension, cocaine, tobacco, and alcohol use who presented to [facility] ED with new onset CHF and atrial flutter and subsequently went into cardiac arrest. Patient is intubated and mechanically ventilated for respiratory failure. Found to have multiple lower extremity DVTs on doppler. Initially started on therapeutic enoxaparin which was discontinued secondary to worsening renal function. Pharmacy has been consulted to initiate heparin infusion for treatment of VTE.   Baseline aPTT 45, INR 2.3 (drawn after first dose enoxaparin). Baseline H&H, platelets within normal limits.   Patient  is in acute renal failure with oliguria.   Goal of Therapy:  Heparin level 0.3-0.7 units/ml Monitor platelets by anticoagulation protocol: Yes   Plan:  -03/26 20:10 HL 0.24, will order Heparin 1300 units IV bolus and increase drip to 1200 units/hr -Heparin level in 8 hours at 0500 -Daily CBC per protocol   06-17-2000, PharmD, BCPS 02/17/2020 9:13 PM

## 2020-02-17 NOTE — Consult Note (Signed)
ANTICOAGULATION CONSULT NOTE - Initial Consult  Pharmacy Consult for Heparin infusion Indication: DVT  No Known Allergies  Patient Measurements: Height: 5\' 11"  (180.3 cm) Weight: 200 lb (90.7 kg) IBW/kg (Calculated) : 75.3 Heparin Dosing Weight: 90.7 kg  Vital Signs: Temp: 97.7 F (36.5 C) (03/26 0800) Temp Source: Axillary (03/26 0800) BP: 112/79 (03/26 1300)  Labs: Recent Labs     0000 02/15/20 0005 02/15/20 0235 02/15/20 0515 02/15/20 1110 02/16/20 0430 02/16/20 0430 02/16/20 1000 02/17/20 0450 02/17/20 1235  HGB   < >  --  14.3  --   --  14.2  --   --  13.5  --   HCT  --   --  45.3  --   --  43.4  --   --  39.8  --   PLT  --   --  169  --   --  152  --   --  121*  --   HEPARINUNFRC  --   --  0.76*  --    < > 0.66   < > 0.50 0.33 0.37  CREATININE  --   --  4.56*  --   --  5.24*  --   --  4.73*  --   TROPONINIHS  --  172* 165* 144*  --   --   --   --   --   --    < > = values in this interval not displayed.    Estimated Creatinine Clearance: 18.4 mL/min (A) (by C-G formula based on SCr of 4.73 mg/dL (H)).   Medical History: Past Medical History:  Diagnosis Date  . Hypertension   . Migraines     Medications:  -No anticoagulation prior to admission per chart -Last dose therapeutic enoxaparin 3/23 @ 0637  Assessment: Patient is a 64 y/o M with a history of hypertension, cocaine, tobacco, and alcohol use who presented to [facility] ED with new onset CHF and atrial flutter and subsequently went into cardiac arrest. Patient is intubated and mechanically ventilated for respiratory failure. Found to have multiple lower extremity DVTs on doppler. Initially started on therapeutic enoxaparin which was discontinued secondary to worsening renal function. Pharmacy has been consulted to initiate heparin infusion for treatment of VTE.   Baseline aPTT 45, INR 2.3 (drawn after first dose enoxaparin). Baseline H&H, platelets within normal limits.   Patient is in acute renal  failure with oliguria.   Goal of Therapy:  Heparin level 0.3-0.7 units/ml Monitor platelets by anticoagulation protocol: Yes   Plan:  -03/26 @ 1235 HL 0.37, therapeutic. Continue current rate of 1000 units/hr -Heparin level today at 2000 -Daily CBC per protocol   06-17-2000 Pharmacy Resident 02/17/2020,2:26 PM

## 2020-02-18 LAB — COMPREHENSIVE METABOLIC PANEL
ALT: 802 U/L — ABNORMAL HIGH (ref 0–44)
AST: 530 U/L — ABNORMAL HIGH (ref 15–41)
Albumin: 2.4 g/dL — ABNORMAL LOW (ref 3.5–5.0)
Alkaline Phosphatase: 107 U/L (ref 38–126)
Anion gap: 11 (ref 5–15)
BUN: 78 mg/dL — ABNORMAL HIGH (ref 8–23)
CO2: 34 mmol/L — ABNORMAL HIGH (ref 22–32)
Calcium: 8.3 mg/dL — ABNORMAL LOW (ref 8.9–10.3)
Chloride: 89 mmol/L — ABNORMAL LOW (ref 98–111)
Creatinine, Ser: 3.67 mg/dL — ABNORMAL HIGH (ref 0.61–1.24)
GFR calc Af Amer: 19 mL/min — ABNORMAL LOW (ref >60)
GFR calc non Af Amer: 17 mL/min — ABNORMAL LOW (ref >60)
Glucose, Bld: 161 mg/dL — ABNORMAL HIGH (ref 70–99)
Potassium: 3.9 mmol/L (ref 3.5–5.1)
Sodium: 134 mmol/L — ABNORMAL LOW (ref 135–145)
Total Bilirubin: 3.2 mg/dL — ABNORMAL HIGH (ref 0.3–1.2)
Total Protein: 5.9 g/dL — ABNORMAL LOW (ref 6.5–8.1)

## 2020-02-18 LAB — CBC WITH DIFFERENTIAL/PLATELET
Abs Immature Granulocytes: 0.09 10*3/uL — ABNORMAL HIGH (ref 0.00–0.07)
Basophils Absolute: 0 10*3/uL (ref 0.0–0.1)
Basophils Relative: 0 %
Eosinophils Absolute: 0 10*3/uL (ref 0.0–0.5)
Eosinophils Relative: 0 %
HCT: 40 % (ref 39.0–52.0)
Hemoglobin: 13.5 g/dL (ref 13.0–17.0)
Immature Granulocytes: 1 %
Lymphocytes Relative: 9 %
Lymphs Abs: 0.8 10*3/uL (ref 0.7–4.0)
MCH: 29.7 pg (ref 26.0–34.0)
MCHC: 33.8 g/dL (ref 30.0–36.0)
MCV: 88.1 fL (ref 80.0–100.0)
Monocytes Absolute: 0.4 10*3/uL (ref 0.1–1.0)
Monocytes Relative: 5 %
Neutro Abs: 8 10*3/uL — ABNORMAL HIGH (ref 1.7–7.7)
Neutrophils Relative %: 85 %
Platelets: 99 10*3/uL — ABNORMAL LOW (ref 150–400)
RBC: 4.54 MIL/uL (ref 4.22–5.81)
RDW: 15.1 % (ref 11.5–15.5)
WBC: 9.3 10*3/uL (ref 4.0–10.5)
nRBC: 0.4 % — ABNORMAL HIGH (ref 0.0–0.2)

## 2020-02-18 LAB — GLUCOSE, CAPILLARY
Glucose-Capillary: 137 mg/dL — ABNORMAL HIGH (ref 70–99)
Glucose-Capillary: 155 mg/dL — ABNORMAL HIGH (ref 70–99)
Glucose-Capillary: 156 mg/dL — ABNORMAL HIGH (ref 70–99)
Glucose-Capillary: 158 mg/dL — ABNORMAL HIGH (ref 70–99)
Glucose-Capillary: 162 mg/dL — ABNORMAL HIGH (ref 70–99)
Glucose-Capillary: 172 mg/dL — ABNORMAL HIGH (ref 70–99)

## 2020-02-18 LAB — PHOSPHORUS: Phosphorus: 5.4 mg/dL — ABNORMAL HIGH (ref 2.5–4.6)

## 2020-02-18 LAB — HEPARIN LEVEL (UNFRACTIONATED)
Heparin Unfractionated: 0.36 IU/mL (ref 0.30–0.70)
Heparin Unfractionated: 0.47 IU/mL (ref 0.30–0.70)

## 2020-02-18 LAB — MAGNESIUM: Magnesium: 1.9 mg/dL (ref 1.7–2.4)

## 2020-02-18 NOTE — Progress Notes (Signed)
Pharmacy Antibiotic Note  Shawn Taylor is a 64 y.o. male admitted on 02/13/2020 status post cardiac arrest due to DVTs and possible PE with cardiogenic shock and multiorgan failure. Patient is afebrile since admission (3/22). Possible aspiration pneumonia. Pharmacy has been consulted for unasyn dosing.  Plan: Unasyn IV 3 g Q12H  Height: 5\' 11"  (180.3 cm) Weight: 221 lb 5.5 oz (100.4 kg) IBW/kg (Calculated) : 75.3  Temp (24hrs), Avg:98 F (36.7 C), Min:97.7 F (36.5 C), Max:98.2 F (36.8 C)  Recent Labs  Lab 02/14/20 0109 02/14/20 0109 02/14/20 0521 02/15/20 0235 02/16/20 0430 02/17/20 0450 02/18/20 0445  WBC 12.8*  --   --  21.4* 18.1* 12.4* 9.3  CREATININE 3.17*   < > 3.06* 4.56* 5.24* 4.73* 3.67*   < > = values in this interval not displayed.    Estimated Creatinine Clearance: 24.9 mL/min (A) (by C-G formula based on SCr of 3.67 mg/dL (H)).    No Known Allergies  Antimicrobials this admission: Unasyn 3/24 >>   Dose adjustments this admission: n/a  Microbiology results: 3/23 BCx: Pending 3/24 Sputum: FEW GRAM NEGATIVE RODS, FEW GRAM POSITIVE COCCI, RARE YEAST  3/23 MRSA PCR: Negative  Thank you for allowing pharmacy to be a part of this patient's care.  4/23, PharmD, BCPS 02/18/2020 7:28 AM

## 2020-02-18 NOTE — Consult Note (Signed)
ANTICOAGULATION CONSULT NOTE - Initial Consult  Pharmacy Consult for Heparin infusion Indication: DVT  No Known Allergies  Patient Measurements: Height: 5\' 11"  (180.3 cm) Weight: 221 lb 5.5 oz (100.4 kg) IBW/kg (Calculated) : 75.3 Heparin Dosing Weight: 90.7 kg  Vital Signs: Temp: 98 F (36.7 C) (03/27 0800) Temp Source: Axillary (03/27 0800) BP: 125/81 (03/27 1100) Pulse Rate: 60 (03/27 0400)  Labs: Recent Labs    02/16/20 0430 02/16/20 1000 02/17/20 0450 02/17/20 1235 02/17/20 2010 02/18/20 0445 02/18/20 1106  HGB 14.2  --  13.5  --   --  13.5  --   HCT 43.4  --  39.8  --   --  40.0  --   PLT 152  --  121*  --   --  99*  --   HEPARINUNFRC 0.66   < > 0.33   < > 0.24* 0.36 0.47  CREATININE 5.24*  --  4.73*  --   --  3.67*  --    < > = values in this interval not displayed.    Estimated Creatinine Clearance: 24.9 mL/min (A) (by C-G formula based on SCr of 3.67 mg/dL (H)).   Medical History: Past Medical History:  Diagnosis Date  . Hypertension   . Migraines     Medications:  -No anticoagulation prior to admission per chart -Last dose therapeutic enoxaparin 3/23 @ 0637  Assessment: Patient is a 64 y/o M with a history of hypertension, cocaine, tobacco, and alcohol use who presented to [facility] ED with new onset CHF and atrial flutter and subsequently went into cardiac arrest. Patient is intubated and mechanically ventilated for respiratory failure. Found to have multiple lower extremity DVTs on doppler. Initially started on therapeutic enoxaparin which was discontinued secondary to worsening renal function. Pharmacy has been consulted to initiate heparin infusion for treatment of VTE.   Baseline aPTT 45, INR 2.3 (drawn after first dose enoxaparin). Baseline H&H, platelets within normal limits.   Patient is in acute renal failure with oliguria.   3/27 0445 HL 0.36  3/27 1106 HL 0.47   Goal of Therapy:  Heparin level 0.3-0.7 units/ml Monitor platelets by  anticoagulation protocol: Yes   Plan:  Heparin level therapeutic x 2. Will continue current rate and will recheck HL and CBC with AM labs.  4/27, PharmD, BCPS Clinical Pharmacist 11:39 AM

## 2020-02-18 NOTE — Consult Note (Signed)
ANTICOAGULATION CONSULT NOTE - Initial Consult  Pharmacy Consult for Heparin infusion Indication: DVT  No Known Allergies  Patient Measurements: Height: 5\' 11"  (180.3 cm) Weight: 200 lb (90.7 kg) IBW/kg (Calculated) : 75.3 Heparin Dosing Weight: 90.7 kg  Vital Signs: Temp: 98.2 F (36.8 C) (03/27 0400) Temp Source: Oral (03/27 0400) BP: 123/80 (03/27 0400) Pulse Rate: 60 (03/27 0400)  Labs: Recent Labs    02/16/20 0430 02/16/20 1000 02/17/20 0450 02/17/20 0450 02/17/20 1235 02/17/20 2010 02/18/20 0445  HGB 14.2  --  13.5  --   --   --   --   HCT 43.4  --  39.8  --   --   --   --   PLT 152  --  121*  --   --   --   --   HEPARINUNFRC 0.66   < > 0.33   < > 0.37 0.24* 0.36  CREATININE 5.24*  --  4.73*  --   --   --   --    < > = values in this interval not displayed.    Estimated Creatinine Clearance: 18.4 mL/min (A) (by C-G formula based on SCr of 4.73 mg/dL (H)).   Medical History: Past Medical History:  Diagnosis Date  . Hypertension   . Migraines     Medications:  -No anticoagulation prior to admission per chart -Last dose therapeutic enoxaparin 3/23 @ 0637  Assessment: Patient is a 64 y/o M with a history of hypertension, cocaine, tobacco, and alcohol use who presented to [facility] ED with new onset CHF and atrial flutter and subsequently went into cardiac arrest. Patient is intubated and mechanically ventilated for respiratory failure. Found to have multiple lower extremity DVTs on doppler. Initially started on therapeutic enoxaparin which was discontinued secondary to worsening renal function. Pharmacy has been consulted to initiate heparin infusion for treatment of VTE.   Baseline aPTT 45, INR 2.3 (drawn after first dose enoxaparin). Baseline H&H, platelets within normal limits.   Patient is in acute renal failure with oliguria.   Goal of Therapy:  Heparin level 0.3-0.7 units/ml Monitor platelets by anticoagulation protocol: Yes   Plan:  03/27 @ 0500 HL  0.36 therapeutic. Will continue current rate and will recheck HL at 1100 and continue to monitor.  4/27, PharmD, BCPS Clinical Pharmacist 5:45 AM

## 2020-02-18 NOTE — Progress Notes (Addendum)
CRITICAL CARE NOTE 64 yo male admitted s/p respiratory arrest followed by cardiac arrest, cardiogenic shock, pulmonary edema, severe metabolic acidosis, BLE DVT'sconcerning for possible pulmonary embolismsrequiring mechanical intubation and levophed gtt  SIGNIFICANT EVENTS/STUDIES: 03/22: Pt admitted to the stepdown unit due to atrial flutter with rvr, acute hypoxic respiratory failure secondary to severe metabolic acidosis and pulmonary edema 03/22: Venous US Bilateral Extremity revealed occlusive thrombus within the right popliteal vein, posterior tibial vein, and peroneal veins. Occlusive thrombus within the left popliteal vein and left anterior peroneal vein. 3/22: While awaiting bed availability in the stepdown unit pt respiratory arrested which led to cardiac arrest ROSC obtained, pt mechanically intubation, and admitted to ICU 3/34mltiorgan failure, family at bedside updated EF<20%. The left  ventricle has severely decreased function. The left ventricle demonstrates  global hypokinesis.  3/24remains critically ill, multiorgan failure 3/25remains critically ill, multiorgan failure, seems to be following some simple commands, mostly delirious, son at bedside updated 3/26 failed weaning trials , multiorgan failure  CC  follow up respiratory failure  SUBJECTIVE Patient remains critically ill Prognosis is guarded  Vent Mode: PRVC FiO2 (%):  [35 %] 35 % Set Rate:  [6 bmp-24 bmp] 24 bmp Vt Set:  [450 mL] 450 mL PEEP:  [5 cmH20] 5 cmH20 Pressure Support:  [5 cmH20] 5 cmH20 Plateau Pressure:  [16 cmH20-28 cmH20] 28 cmH20   BP 120/76   Pulse 60   Temp 98.2 F (36.8 C) (Oral)   Resp (!) 24   Ht 5' 11"  (1.803 m)   Wt 100.4 kg   SpO2 97%   BMI 30.87 kg/m    I/O last 3 completed shifts: In: 3207.9 [I.V.:2553; IV Piggyback:654.9] Out: 45701[Urine:4025; Emesis/NG output:145] No intake/output data recorded.  SpO2: 97 % O2 Flow Rate (L/min): 15 L/min FiO2 (%): 35  %  Estimated body mass index is 30.87 kg/m as calculated from the following:   Height as of this encounter: 5' 11"  (1.803 m).   Weight as of this encounter: 100.4 kg.  BMP Latest Ref Rng & Units 02/18/2020 02/17/2020 02/16/2020  Glucose 70 - 99 mg/dL 161(H) 148(H) 189(H)  BUN 8 - 23 mg/dL 78(H) 83(H) 74(H)  Creatinine 0.61 - 1.24 mg/dL 3.67(H) 4.73(H) 5.24(H)  Sodium 135 - 145 mmol/L 134(L) 135 133(L)  Potassium 3.5 - 5.1 mmol/L 3.9 4.4 4.5  Chloride 98 - 111 mmol/L 89(L) 87(L) 83(L)  CO2 22 - 32 mmol/L 34(H) 33(H) 28  Calcium 8.9 - 10.3 mg/dL 8.3(L) 7.8(L) 6.9(L)     REVIEW OF SYSTEMS  PATIENT IS UNABLE TO PROVIDE COMPLETE REVIEW OF SYSTEMS DUE TO SEVERE CRITICAL ILLNESS        PHYSICAL EXAMINATION:  GENERAL:critically ill appearing, +resp distress HEAD: Normocephalic, atraumatic.  EYES: Pupils equal, round, reactive to light.  No scleral icterus.  MOUTH: Moist mucosal membrane. NECK: Supple.  PULMONARY: +rhonchi, +wheezing CARDIOVASCULAR: S1 and S2. Regular rate and rhythm. No murmurs, rubs, or gallops.  GASTROINTESTINAL: Soft, nontender, -distended.  Positive bowel sounds.   MUSCULOSKELETAL: No swelling, clubbing, or edema.  NEUROLOGIC: obtunded, GCS<8 SKIN:intact,warm,dry  MEDICATIONS: I have reviewed all medications and confirmed regimen as documented   CULTURE RESULTS   Recent Results (from the past 240 hour(s))  Respiratory Panel by RT PCR (Flu A&B, Covid) - Nasopharyngeal Swab     Status: None   Collection Time: 02/13/20  4:21 PM   Specimen: Nasopharyngeal Swab  Result Value Ref Range Status   SARS Coronavirus 2 by RT PCR NEGATIVE NEGATIVE Final  Comment: (NOTE) SARS-CoV-2 target nucleic acids are NOT DETECTED. The SARS-CoV-2 RNA is generally detectable in upper respiratoy specimens during the acute phase of infection. The lowest concentration of SARS-CoV-2 viral copies this assay can detect is 131 copies/mL. A negative result does not preclude  SARS-Cov-2 infection and should not be used as the sole basis for treatment or other patient management decisions. A negative result may occur with  improper specimen collection/handling, submission of specimen other than nasopharyngeal swab, presence of viral mutation(s) within the areas targeted by this assay, and inadequate number of viral copies (<131 copies/mL). A negative result must be combined with clinical observations, patient history, and epidemiological information. The expected result is Negative. Fact Sheet for Patients:  PinkCheek.be Fact Sheet for Healthcare Providers:  GravelBags.it This test is not yet ap proved or cleared by the [place] FDA and  has been authorized for detection and/or diagnosis of SARS-CoV-2 by FDA under an Emergency Use Authorization (EUA). This EUA will remain  in effect (meaning this test can be used) for the duration of the COVID-19 declaration under Section 564(b)(1) of the Act, 21 U.S.C. section 360bbb-3(b)(1), unless the authorization is terminated or revoked sooner.    Influenza A by PCR NEGATIVE NEGATIVE Final   Influenza B by PCR NEGATIVE NEGATIVE Final    Comment: (NOTE) The Xpert Xpress SARS-CoV-2/FLU/RSV assay is intended as an aid in  the diagnosis of influenza from Nasopharyngeal swab specimens and  should not be used as a sole basis for treatment. Nasal washings and  aspirates are unacceptable for Xpert Xpress SARS-CoV-2/FLU/RSV  testing. Fact Sheet for Patients: PinkCheek.be Fact Sheet for Healthcare Providers: GravelBags.it This test is not yet approved or cleared by the [place] FDA and  has been authorized for detection and/or diagnosis of SARS-CoV-2 by  FDA under an Emergency Use Authorization (EUA). This EUA will remain  in effect (meaning this test can be used) for the duration of the  Covid-19  declaration under Section 564(b)(1) of the Act, 21  U.S.C. section 360bbb-3(b)(1), unless the authorization is  terminated or revoked. Performed at [facility], [address] [place], [place] [address]   CULTURE, BLOOD (ROUTINE X 2) w Reflex to ID Panel     Status: None (Preliminary result)   Collection Time: 02/14/20  3:42 AM   Specimen: BLOOD  Result Value Ref Range Status   Specimen Description BLOOD LEFT HAND  Final   Special Requests   Final    BOTTLES DRAWN AEROBIC AND ANAEROBIC Blood Culture adequate volume   Culture   Final    NO GROWTH 4 DAYS Performed at [facility], [address] [place], [place] [address]    Report Status PENDING  Incomplete  CULTURE, BLOOD (ROUTINE X 2) w Reflex to ID Panel     Status: None (Preliminary result)   Collection Time: 02/14/20  3:42 AM   Specimen: BLOOD  Result Value Ref Range Status   Specimen Description BLOOD RIGHT HAND  Final   Special Requests   Final    BOTTLES DRAWN AEROBIC AND ANAEROBIC Blood Culture results may not be optimal due to an inadequate volume of blood received in culture bottles   Culture   Final    NO GROWTH 4 DAYS Performed at [facility], [address] [place], [place] [address]    Report Status PENDING  Incomplete  MRSA PCR Screening     Status: None   Collection Time: 02/14/20  3:49 AM  Specimen: Nasopharyngeal  Result Value Ref Range Status   MRSA by PCR NEGATIVE NEGATIVE Final    Comment:        The GeneXpert MRSA Assay (FDA approved for NASAL specimens only), is one component of a comprehensive MRSA colonization surveillance program. It is not intended to diagnose MRSA infection nor to guide or monitor treatment for MRSA infections. Performed at [facility], [address] [place], [place] [address]   Culture, respiratory     Status: None   Collection Time: 02/15/20  9:46 AM   Specimen: Tracheal Aspirate  Result Value Ref Range Status   Specimen  Description   Final    TRACHEAL ASPIRATE Performed at [facility], [address] [place], [place] [address]    Special Requests   Final    NONE Performed at [facility], [place], [place] [address]    Gram Stain   Final    ABUNDANT WBC PRESENT,BOTH PMN AND MONONUCLEAR FEW GRAM NEGATIVE RODS FEW GRAM POSITIVE COCCI RARE YEAST    Culture   Final    Consistent with normal respiratory flora. Performed at [facility], [place] [address], [place],  [address]    Report Status 02/17/2020 FINAL  Final          IMAGING    DG Abd 1 View  Result Date: 02/17/2020 CLINICAL DATA:  OG tube placed EXAM: ABDOMEN - 1 VIEW COMPARISON:  February 17, 2020 FINDINGS: The bowel gas pattern is normal. Tip of the NG tube is seen within the proximal stomach. No radio-opaque calculi or other significant radiographic abnormality are seen. IMPRESSION: Tip the NG tube in the proximal stomach. Electronically Signed   By: Prudencio Pair M.D.   On: 02/17/2020 20:58   DG Abd Portable 1V  Result Date: 02/17/2020 CLINICAL DATA:  Vomiting, intubated EXAM: PORTABLE ABDOMEN - 1 VIEW COMPARISON:  None. FINDINGS: The bowel gas pattern is normal. No radio-opaque calculi or other significant radiographic abnormality are seen. Foley catheter is present. IMPRESSION: Negative. Electronically Signed   By: Kathreen Devoid   On: 02/17/2020 11:10     Nutrition Status: Nutrition Problem: Inadequate oral intake Etiology: inability to eat Signs/Symptoms: NPO status Interventions: Tube feeding     Indwelling Urinary Catheter continued, requirement due to   Reason to continue Indwelling Urinary Catheter strict Intake/Output monitoring for hemodynamic instability   Central Line/ continued, requirement due to  Reason to continue [place] of central venous pressure or other hemodynamic parameters and poor IV access   Ventilator continued, requirement due to severe  respiratory failure   Ventilator Sedation RASS 0 to -2      ASSESSMENT AND PLAN SYNOPSIS  64 year old African-American male status post cardiac arrest due to DVTs and PEs with cardiogenic shock with multiorgan failure with severe acute systolic heart failure, renal failure and liver failure  Severe ACUTE Hypoxic and Hypercapnic Respiratory Failure -continue Full MV support -continue Bronchodilator Therapy -Wean Fio2 and PEEP as tolerated -will perform SAT/SBT when respiratory parameters are met  ACUTE SYSTOLIC CARDIAC FAILURE- EF <20% -oxygen as needed -Lasix as tolerated  ACUTE KIDNEY INJURY/Renal Failure -follow chem 7 -follow UO -continue Foley Catheter-assess need -Avoid nephrotoxic agents -Recheck creatinine     NEUROLOGY - intubated and sedated - minimal sedation to achieve a RASS goal: -1 Wake up assessment pending   CARDIAC ICU monitoring  ID -continue IV abx as prescibed -follow up cultures  GI GI PROPHYLAXIS as  indicated LIVER FAILURE-follow LFT's H/o HEP C   DVT/PE-continue anticoagulation High risk for bleeding   NUTRITIONAL STATUS Nutrition Status: Nutrition Problem: Inadequate oral intake Etiology: inability to eat Signs/Symptoms: NPO status Interventions: Tube feeding   DIET-->TF's as tolerated Constipation protocol as indicated  ENDO - will use ICU hypoglycemic\Hyperglycemia protocol if indicated   ELECTROLYTES -follow labs as needed -replace as needed -pharmacy consultation and following   DVT/GI PRX ordered TRANSFUSIONS AS NEEDED MONITOR FSBS ASSESS the need for LABS as needed   Critical Care Time devoted to patient care services described in this note is 37 minutes.   Overall, patient is critically ill, prognosis is guarded.  Patient with Multiorgan failure and at high risk for cardiac arrest and death.   MULTIORGAN FAILURE WITH FAILED SAT/SBT's  I ANTICIPATE PROLONGED ICU LOS  Corrin Parker, M.D.   [facility]  Medical Director [place] Director [facility] Cardio-Pulmonary Department

## 2020-02-19 LAB — CULTURE, BLOOD (ROUTINE X 2)
Culture: NO GROWTH
Culture: NO GROWTH
Special Requests: ADEQUATE

## 2020-02-19 LAB — CBC WITH DIFFERENTIAL/PLATELET
Abs Immature Granulocytes: 0.07 10*3/uL (ref 0.00–0.07)
Basophils Absolute: 0 10*3/uL (ref 0.0–0.1)
Basophils Relative: 0 %
Eosinophils Absolute: 0 10*3/uL (ref 0.0–0.5)
Eosinophils Relative: 0 %
HCT: 41.4 % (ref 39.0–52.0)
Hemoglobin: 13.8 g/dL (ref 13.0–17.0)
Immature Granulocytes: 1 %
Lymphocytes Relative: 8 %
Lymphs Abs: 0.7 10*3/uL (ref 0.7–4.0)
MCH: 30.2 pg (ref 26.0–34.0)
MCHC: 33.3 g/dL (ref 30.0–36.0)
MCV: 90.6 fL (ref 80.0–100.0)
Monocytes Absolute: 0.6 10*3/uL (ref 0.1–1.0)
Monocytes Relative: 7 %
Neutro Abs: 7.3 10*3/uL (ref 1.7–7.7)
Neutrophils Relative %: 84 %
Platelets: 88 10*3/uL — ABNORMAL LOW (ref 150–400)
RBC: 4.57 MIL/uL (ref 4.22–5.81)
RDW: 14.6 % (ref 11.5–15.5)
WBC: 8.7 10*3/uL (ref 4.0–10.5)
nRBC: 0.7 % — ABNORMAL HIGH (ref 0.0–0.2)

## 2020-02-19 LAB — COMPREHENSIVE METABOLIC PANEL
ALT: 613 U/L — ABNORMAL HIGH (ref 0–44)
AST: 274 U/L — ABNORMAL HIGH (ref 15–41)
Albumin: 2.3 g/dL — ABNORMAL LOW (ref 3.5–5.0)
Alkaline Phosphatase: 101 U/L (ref 38–126)
Anion gap: 10 (ref 5–15)
BUN: 68 mg/dL — ABNORMAL HIGH (ref 8–23)
CO2: 32 mmol/L (ref 22–32)
Calcium: 8.3 mg/dL — ABNORMAL LOW (ref 8.9–10.3)
Chloride: 96 mmol/L — ABNORMAL LOW (ref 98–111)
Creatinine, Ser: 2.79 mg/dL — ABNORMAL HIGH (ref 0.61–1.24)
GFR calc Af Amer: 27 mL/min — ABNORMAL LOW (ref >60)
GFR calc non Af Amer: 23 mL/min — ABNORMAL LOW (ref >60)
Glucose, Bld: 140 mg/dL — ABNORMAL HIGH (ref 70–99)
Potassium: 3.7 mmol/L (ref 3.5–5.1)
Sodium: 138 mmol/L (ref 135–145)
Total Bilirubin: 3.2 mg/dL — ABNORMAL HIGH (ref 0.3–1.2)
Total Protein: 5.8 g/dL — ABNORMAL LOW (ref 6.5–8.1)

## 2020-02-19 LAB — PHOSPHORUS: Phosphorus: 4.8 mg/dL — ABNORMAL HIGH (ref 2.5–4.6)

## 2020-02-19 LAB — GLUCOSE, CAPILLARY
Glucose-Capillary: 138 mg/dL — ABNORMAL HIGH (ref 70–99)
Glucose-Capillary: 140 mg/dL — ABNORMAL HIGH (ref 70–99)
Glucose-Capillary: 140 mg/dL — ABNORMAL HIGH (ref 70–99)
Glucose-Capillary: 146 mg/dL — ABNORMAL HIGH (ref 70–99)
Glucose-Capillary: 149 mg/dL — ABNORMAL HIGH (ref 70–99)
Glucose-Capillary: 154 mg/dL — ABNORMAL HIGH (ref 70–99)
Glucose-Capillary: 201 mg/dL — ABNORMAL HIGH (ref 70–99)

## 2020-02-19 LAB — MAGNESIUM: Magnesium: 1.8 mg/dL (ref 1.7–2.4)

## 2020-02-19 LAB — HEPARIN LEVEL (UNFRACTIONATED): Heparin Unfractionated: 0.5 IU/mL (ref 0.30–0.70)

## 2020-02-19 MED ORDER — DOCUSATE SODIUM 50 MG/5ML PO LIQD
100.0000 mg | Freq: Two times a day (BID) | ORAL | Status: DC
  Administered 2020-02-19 – 2020-02-20 (×3): 100 mg
  Filled 2020-02-19 (×3): qty 10

## 2020-02-19 MED ORDER — BISACODYL 10 MG RE SUPP: 10.0000 mg | Freq: Every day | RECTAL | Status: DC | PRN

## 2020-02-19 MED ORDER — SODIUM CHLORIDE 0.9 % IV SOLN
3.0000 g | Freq: Four times a day (QID) | INTRAVENOUS | Status: AC
  Administered 2020-02-19 – 2020-02-20 (×2): 3 g via INTRAVENOUS
  Filled 2020-02-19 (×2): qty 3

## 2020-02-19 MED ORDER — SENNOSIDES 8.8 MG/5ML PO SYRP
5.0000 mL | ORAL_SOLUTION | Freq: Every day | ORAL | Status: DC
  Administered 2020-02-19 – 2020-02-20 (×2): 5 mL
  Filled 2020-02-19 (×4): qty 5

## 2020-02-19 NOTE — Progress Notes (Signed)
bair hugger placed on ambient.

## 2020-02-19 NOTE — Progress Notes (Signed)
Pharmacy Antibiotic Note  Shawn Taylor is a 64 y.o. male admitted on 02/13/2020 status post cardiac arrest due to DVTs and possible PE with cardiogenic shock and multiorgan failure. Patient is afebrile since admission (3/22). Possible aspiration pneumonia. Pharmacy has been consulted for unasyn dosing.  Plan: Day 5 of abx. Unasyn IV 3 g Q12H  Height: 5\' 11"  (180.3 cm) Weight: 216 lb 11.4 oz (98.3 kg) IBW/kg (Calculated) : 75.3  Temp (24hrs), Avg:97.4 F (36.3 C), Min:93.9 F (34.4 C), Max:98.8 F (37.1 C)  Recent Labs  Lab 02/15/20 0235 02/16/20 0430 02/17/20 0450 02/18/20 0445 02/19/20 0430  WBC 21.4* 18.1* 12.4* 9.3 8.7  CREATININE 4.56* 5.24* 4.73* 3.67* 2.79*    Estimated Creatinine Clearance: 32.4 mL/min (A) (by C-G formula based on SCr of 2.79 mg/dL (H)).    No Known Allergies  Antimicrobials this admission: Unasyn 3/24 >>   Dose adjustments this admission: n/a  Microbiology results: 3/23 BCx: NGTD 3/24 Sputum: FEW GRAM NEGATIVE RODS, FEW GRAM POSITIVE COCCI, RARE YEAST: Consistent with normal respiratory flora 3/23 MRSA PCR: Negative  Thank you for allowing pharmacy to be a part of this patient's care.  4/23, PharmD, BCPS 02/19/2020 10:21 AM

## 2020-02-19 NOTE — Progress Notes (Signed)
Bair hugger applied.

## 2020-02-19 NOTE — Consult Note (Signed)
ANTICOAGULATION CONSULT NOTE - Initial Consult  Pharmacy Consult for Heparin infusion Indication: DVT  No Known Allergies  Patient Measurements: Height: 5\' 11"  (180.3 cm) Weight: 216 lb 11.4 oz (98.3 kg) IBW/kg (Calculated) : 75.3 Heparin Dosing Weight: 90.7 kg  Vital Signs: Temp: 98.2 F (36.8 C) (03/28 0700) Temp Source: Rectal (03/28 0400) BP: 104/67 (03/28 0700) Pulse Rate: 66 (03/28 0400)  Labs: Recent Labs    02/17/20 0450 02/17/20 1235 02/18/20 0445 02/18/20 1106 02/19/20 0430  HGB 13.5  --  13.5  --  13.8  HCT 39.8  --  40.0  --  41.4  PLT 121*  --  99*  --  88*  HEPARINUNFRC 0.33   < > 0.36 0.47 0.50  CREATININE 4.73*  --  3.67*  --  2.79*   < > = values in this interval not displayed.    Estimated Creatinine Clearance: 32.4 mL/min (A) (by C-G formula based on SCr of 2.79 mg/dL (H)).   Medical History: Past Medical History:  Diagnosis Date  . Hypertension   . Migraines     Medications:  -No anticoagulation prior to admission per chart -Last dose therapeutic enoxaparin 3/23 @ 0637  Assessment: Patient is a 64 y/o M with a history of hypertension, cocaine, tobacco, and alcohol use who presented to [facility] ED with new onset CHF and atrial flutter and subsequently went into cardiac arrest. Patient is intubated and mechanically ventilated for respiratory failure. Found to have multiple lower extremity DVTs on doppler. Initially started on therapeutic enoxaparin which was discontinued secondary to worsening renal function. Pharmacy has been consulted to initiate heparin infusion for treatment of VTE.   Baseline aPTT 45, INR 2.3 (drawn after first dose enoxaparin). Baseline H&H, platelets within normal limits.   Patient is in acute renal failure with oliguria.   3/27 0445 HL 0.36  3/27 1106 HL 0.47   Goal of Therapy:  Heparin level 0.3-0.7 units/ml Monitor platelets by anticoagulation protocol: Yes   Plan:  03/28 @ 0500 HL 0.50 therapeutic. Will  continue current rate and will recheck w/ am labs, plts dropping will continue to monitor.  4/28, PharmD, BCPS Clinical Pharmacist 7:19 AM

## 2020-02-19 NOTE — Progress Notes (Signed)
CRITICAL CARE NOTE 64 yo male admitted s/p respiratory arrest followed by cardiac arrest, cardiogenic shock, pulmonary edema, severe metabolic acidosis, BLE DVT'sconcerning for possible pulmonary embolismsrequiring mechanical intubation and levophed gtt  SIGNIFICANT EVENTS/STUDIES: 03/22: Pt admitted to the stepdown unit due to atrial flutter with rvr, acute hypoxic respiratory failure secondary to severe metabolic acidosis and pulmonary edema 03/22: Venous US Bilateral Extremity revealed occlusive thrombus within the right popliteal vein, posterior tibial vein, and peroneal veins. Occlusive thrombus within the left popliteal vein and left anterior peroneal vein. 3/22: While awaiting bed availability in the stepdown unit pt respiratory arrested which led to cardiac arrest ROSC obtained, pt mechanically intubation, and admitted to ICU 3/34mltiorgan failure, family at bedside updatedEF<20%. The left  ventricle has severely decreased function. The left ventricle demonstrates  global hypokinesis.  3/24remains critically ill, multiorgan failure 3/25remains critically ill, multiorgan failure, seems to be following some simple commands, mostly delirious, son at bedside updated 3/26 failed weaning trials , multiorgan failure 3/27 failed weaning attempt   CC  follow up respiratory failure  SUBJECTIVE Patient remains critically ill Prognosis is guarded Multiple failed weaning attempts due to severe resp failure and severe CHF   BMP Latest Ref Rng & Units 02/19/2020 02/18/2020 02/17/2020  Glucose 70 - 99 mg/dL 140(H) 161(H) 148(H)  BUN 8 - 23 mg/dL 68(H) 78(H) 83(H)  Creatinine 0.61 - 1.24 mg/dL 2.79(H) 3.67(H) 4.73(H)  Sodium 135 - 145 mmol/L 138 134(L) 135  Potassium 3.5 - 5.1 mmol/L 3.7 3.9 4.4  Chloride 98 - 111 mmol/L 96(L) 89(L) 87(L)  CO2 22 - 32 mmol/L 32 34(H) 33(H)  Calcium 8.9 - 10.3 mg/dL 8.3(L) 8.3(L) 7.8(L)     BP 107/71   Pulse 66   Temp 97.9 F (36.6 C)   Resp  (!) 24   Ht '5\' 11"'$  (1.803 m)   Wt 98.3 kg   SpO2 99%   BMI 30.23 kg/m    I/O last 3 completed shifts: In: 4358.7 [I.V.:3548.7; IV Piggyback:810.1] Out: 62836 [OQHUT:6546 Emesis/NG output:295] Total I/O In: 254.7 [I.V.:154.8; IV Piggyback:99.9] Out: 15 [Urine:15]  SpO2: 99 % O2 Flow Rate (L/min): 15 L/min FiO2 (%): 35 %  Estimated body mass index is 30.23 kg/m as calculated from the following:   Height as of this encounter: '5\' 11"'$  (1.803 m).   Weight as of this encounter: 98.3 kg.  SIGNIFICANT EVENTS   REVIEW OF SYSTEMS  PATIENT IS UNABLE TO PROVIDE COMPLETE REVIEW OF SYSTEMS DUE TO SEVERE CRITICAL ILLNESS        PHYSICAL EXAMINATION:  GENERAL:critically ill appearing, +resp distress HEAD: Normocephalic, atraumatic.  EYES: Pupils equal, round, reactive to light.  No scleral icterus.  MOUTH: Moist mucosal membrane. NECK: Supple.  PULMONARY: +rhonchi, +wheezing CARDIOVASCULAR: S1 and S2. Regular rate and rhythm. No murmurs, rubs, or gallops.  GASTROINTESTINAL: Soft, nontender, -distended.  Positive bowel sounds.   MUSCULOSKELETAL: No swelling, clubbing, or edema.  NEUROLOGIC: obtunded, GCS<8 SKIN:intact,warm,dry  MEDICATIONS: I have reviewed all medications and confirmed regimen as documented   CULTURE RESULTS   Recent Results (from the past 240 hour(s))  Respiratory Panel by RT PCR (Flu A&B, Covid) - Nasopharyngeal Swab     Status: None   Collection Time: 02/13/20  4:21 PM   Specimen: Nasopharyngeal Swab  Result Value Ref Range Status   SARS Coronavirus 2 by RT PCR NEGATIVE NEGATIVE Final    Comment: (NOTE) SARS-CoV-2 target nucleic acids are NOT DETECTED. The SARS-CoV-2 RNA is generally detectable in upper respiratoy specimens during the  acute phase of infection. The lowest concentration of SARS-CoV-2 viral copies this assay can detect is 131 copies/mL. A negative result does not preclude SARS-Cov-2 infection and should not be used as the sole basis  for treatment or other patient management decisions. A negative result may occur with  improper specimen collection/handling, submission of specimen other than nasopharyngeal swab, presence of viral mutation(s) within the areas targeted by this assay, and inadequate number of viral copies (<131 copies/mL). A negative result must be combined with clinical observations, patient history, and epidemiological information. The expected result is Negative. Fact Sheet for Patients:  PinkCheek.be Fact Sheet for Healthcare Providers:  GravelBags.it This test is not yet ap proved or cleared by the [place] FDA and  has been authorized for detection and/or diagnosis of SARS-CoV-2 by FDA under an Emergency Use Authorization (EUA). This EUA will remain  in effect (meaning this test can be used) for the duration of the COVID-19 declaration under Section 564(b)(1) of the Act, 21 U.S.C. section 360bbb-3(b)(1), unless the authorization is terminated or revoked sooner.    Influenza A by PCR NEGATIVE NEGATIVE Final   Influenza B by PCR NEGATIVE NEGATIVE Final    Comment: (NOTE) The Xpert Xpress SARS-CoV-2/FLU/RSV assay is intended as an aid in  the diagnosis of influenza from Nasopharyngeal swab specimens and  should not be used as a sole basis for treatment. Nasal washings and  aspirates are unacceptable for Xpert Xpress SARS-CoV-2/FLU/RSV  testing. Fact Sheet for Patients: PinkCheek.be Fact Sheet for Healthcare Providers: GravelBags.it This test is not yet approved or cleared by the [place] FDA and  has been authorized for detection and/or diagnosis of SARS-CoV-2 by  FDA under an Emergency Use Authorization (EUA). This EUA will remain  in effect (meaning this test can be used) for the duration of the  Covid-19 declaration under Section 564(b)(1) of the Act, 21  U.S.C.  section 360bbb-3(b)(1), unless the authorization is  terminated or revoked. Performed at [facility], [address] [place], [place] [address]   CULTURE, BLOOD (ROUTINE X 2) w Reflex to ID Panel     Status: None   Collection Time: 02/14/20  3:42 AM   Specimen: BLOOD  Result Value Ref Range Status   Specimen Description BLOOD LEFT HAND  Final   Special Requests   Final    BOTTLES DRAWN AEROBIC AND ANAEROBIC Blood Culture adequate volume   Culture   Final    NO GROWTH 5 DAYS Performed at [facility], [address] [place], [place] [address]    Report Status 02/19/2020 FINAL  Final  CULTURE, BLOOD (ROUTINE X 2) w Reflex to ID Panel     Status: None   Collection Time: 02/14/20  3:42 AM   Specimen: BLOOD  Result Value Ref Range Status   Specimen Description BLOOD RIGHT HAND  Final   Special Requests   Final    BOTTLES DRAWN AEROBIC AND ANAEROBIC Blood Culture results may not be optimal due to an inadequate volume of blood received in culture bottles   Culture   Final    NO GROWTH 5 DAYS Performed at [facility], [address], [place], [place] [address]    Report Status 02/19/2020 FINAL  Final  MRSA PCR Screening     Status: None   Collection Time: 02/14/20  3:49 AM   Specimen: Nasopharyngeal  Result Value Ref Range Status   MRSA by PCR NEGATIVE NEGATIVE Final    Comment:  The GeneXpert MRSA Assay (FDA approved for NASAL specimens only), is one component of a comprehensive MRSA colonization surveillance program. It is not intended to diagnose MRSA infection nor to guide or monitor treatment for MRSA infections. Performed at [facility], [address] [place], [place] [address]   Culture, respiratory     Status: None   Collection Time: 02/15/20  9:46 AM   Specimen: Tracheal Aspirate  Result Value Ref Range Status   Specimen Description   Final    TRACHEAL ASPIRATE Performed at [facility], [address] [place], [place] [address]    Special Requests   Final    NONE Performed at [facility], [place], [place] [address]    Gram Stain   Final    ABUNDANT WBC PRESENT,BOTH PMN AND MONONUCLEAR FEW GRAM NEGATIVE RODS FEW GRAM POSITIVE COCCI RARE YEAST    Culture   Final    Consistent with normal respiratory flora. Performed at [facility], [place] [address] [place], [place] [address]    Report Status 02/17/2020 FINAL  Final          IMAGING    No results found.   Nutrition Status: Nutrition Problem: Inadequate oral intake Etiology: inability to eat Signs/Symptoms: NPO status Interventions: Tube feeding     Indwelling Urinary Catheter continued, requirement due to   Reason to continue Indwelling Urinary Catheter strict Intake/Output monitoring for hemodynamic instability   Central Line/ continued, requirement due to  Reason to continue Bruno of central venous pressure or other hemodynamic parameters and poor IV access   Ventilator continued, requirement due to severe respiratory failure   Ventilator Sedation RASS 0 to -2      ASSESSMENT AND PLAN SYNOPSIS  64 year old African-American male status post cardiac arrest due to DVTs and PEs with cardiogenic shock with multiorgan failurewith severe acute systolic heart failure, renal failure and liver failure   Severe ACUTE Hypoxic and Hypercapnic Respiratory Failure -continue Full MV support -continue Bronchodilator Therapy -Wean Fio2 and PEEP as tolerated -will perform SAT/SBT when respiratory parameters are met  ACUTE SYSTOLIC CARDIAC FAILURE- EF <20% -oxygen as needed -Lasix as tolerated   ACUTE KIDNEY INJURY/Renal Failure -follow chem 7 -follow UO -continue Foley Catheter-assess need -Avoid nephrotoxic agents -Recheck creatinine    NEUROLOGY - intubated and sedated - minimal sedation to achieve a RASS goal: -1 Wake up assessment  pending   SHOCK-SEPSIS/HYPOVOLUMIC/CARDIOGENIC -use vasopressors to keep MAP>65 -follow ABG and LA -follow up cultures -emperic ABX -consider stress dose steroids -aggressive IV fluid resuscitation  CARDIAC ICU monitoring  ID -continue IV abx as prescibed -follow up cultures  GI GI PROPHYLAXIS as indicated  NUTRITIONAL STATUS Nutrition Status: Nutrition Problem: Inadequate oral intake Etiology: inability to eat Signs/Symptoms: NPO status Interventions: Tube feeding   DIET-->TF's as tolerated Constipation protocol as indicated  ENDO - will use ICU hypoglycemic\Hyperglycemia protocol if indicated   ELECTROLYTES -follow labs as needed -replace as needed -pharmacy consultation and following   DVT/PE-continue Heparin drip  GI PRX ordered TRANSFUSIONS AS NEEDED MONITOR FSBS ASSESS the need for LABS as needed   Critical Care Time devoted to patient care services described in this note is 59mnutes.   Overall, patient is critically ill, prognosis is guarded.  Patient with Multiorgan failure and at high risk for cardiac arrest and death.    KCorrin Parker M.D.  LVelora HecklerPulmonary & Critical Care Medicine  Medical Director IPark FallsDirector AL[facility]  Cardio-Pulmonary Department

## 2020-02-19 NOTE — Progress Notes (Signed)
bair hugger applied on low setting.

## 2020-02-19 NOTE — Progress Notes (Signed)
PHARMACY NOTE:  ANTIMICROBIAL RENAL DOSAGE ADJUSTMENT  Current antimicrobial regimen includes a mismatch between antimicrobial dosage and estimated renal function.  As per policy approved by the Pharmacy & Therapeutics and Medical Executive Committees, the antimicrobial dosage will be adjusted accordingly.  Current antimicrobial dosage:  unasyn 3 gm Q12H   Indication: aspiration PNA  Renal Function:  Estimated Creatinine Clearance: 32.4 mL/min (A) (by C-G formula based on SCr of 2.79 mg/dL (H)).     Antimicrobial dosage has been changed to:  unasyn 3gm IV Q6H   Thank you for allowing pharmacy to be a part of this patient's care.  Martyn Malay, [facility] 02/19/2020 11:04 AM

## 2020-02-20 LAB — COMPREHENSIVE METABOLIC PANEL
ALT: 420 U/L — ABNORMAL HIGH (ref 0–44)
AST: 139 U/L — ABNORMAL HIGH (ref 15–41)
Albumin: 2.3 g/dL — ABNORMAL LOW (ref 3.5–5.0)
Alkaline Phosphatase: 92 U/L (ref 38–126)
Anion gap: 13 (ref 5–15)
BUN: 64 mg/dL — ABNORMAL HIGH (ref 8–23)
CO2: 30 mmol/L (ref 22–32)
Calcium: 8.4 mg/dL — ABNORMAL LOW (ref 8.9–10.3)
Chloride: 98 mmol/L (ref 98–111)
Creatinine, Ser: 2.44 mg/dL — ABNORMAL HIGH (ref 0.61–1.24)
GFR calc Af Amer: 31 mL/min — ABNORMAL LOW (ref >60)
GFR calc non Af Amer: 27 mL/min — ABNORMAL LOW (ref >60)
Glucose, Bld: 164 mg/dL — ABNORMAL HIGH (ref 70–99)
Potassium: 3.7 mmol/L (ref 3.5–5.1)
Sodium: 141 mmol/L (ref 135–145)
Total Bilirubin: 3.3 mg/dL — ABNORMAL HIGH (ref 0.3–1.2)
Total Protein: 5.9 g/dL — ABNORMAL LOW (ref 6.5–8.1)

## 2020-02-20 LAB — TRIGLYCERIDES: Triglycerides: 90 mg/dL (ref <150)

## 2020-02-20 LAB — BRAIN NATRIURETIC PEPTIDE: B Natriuretic Peptide: 532 pg/mL — ABNORMAL HIGH (ref 0.0–100.0)

## 2020-02-20 LAB — CBC WITH DIFFERENTIAL/PLATELET
Abs Immature Granulocytes: 0.09 10*3/uL — ABNORMAL HIGH (ref 0.00–0.07)
Basophils Absolute: 0 10*3/uL (ref 0.0–0.1)
Basophils Relative: 0 %
Eosinophils Absolute: 0 10*3/uL (ref 0.0–0.5)
Eosinophils Relative: 0 %
HCT: 41.9 % (ref 39.0–52.0)
Hemoglobin: 13.9 g/dL (ref 13.0–17.0)
Immature Granulocytes: 1 %
Lymphocytes Relative: 8 %
Lymphs Abs: 0.6 10*3/uL — ABNORMAL LOW (ref 0.7–4.0)
MCH: 30.2 pg (ref 26.0–34.0)
MCHC: 33.2 g/dL (ref 30.0–36.0)
MCV: 90.9 fL (ref 80.0–100.0)
Monocytes Absolute: 0.5 10*3/uL (ref 0.1–1.0)
Monocytes Relative: 6 %
Neutro Abs: 7 10*3/uL (ref 1.7–7.7)
Neutrophils Relative %: 85 %
Platelets: 77 10*3/uL — ABNORMAL LOW (ref 150–400)
RBC: 4.61 MIL/uL (ref 4.22–5.81)
RDW: 15 % (ref 11.5–15.5)
WBC: 8.3 10*3/uL (ref 4.0–10.5)
nRBC: 0.5 % — ABNORMAL HIGH (ref 0.0–0.2)

## 2020-02-20 LAB — PHOSPHORUS: Phosphorus: 5.7 mg/dL — ABNORMAL HIGH (ref 2.5–4.6)

## 2020-02-20 LAB — GLUCOSE, CAPILLARY
Glucose-Capillary: 144 mg/dL — ABNORMAL HIGH (ref 70–99)
Glucose-Capillary: 146 mg/dL — ABNORMAL HIGH (ref 70–99)
Glucose-Capillary: 151 mg/dL — ABNORMAL HIGH (ref 70–99)
Glucose-Capillary: 154 mg/dL — ABNORMAL HIGH (ref 70–99)
Glucose-Capillary: 157 mg/dL — ABNORMAL HIGH (ref 70–99)

## 2020-02-20 LAB — MAGNESIUM: Magnesium: 1.8 mg/dL (ref 1.7–2.4)

## 2020-02-20 LAB — APTT
aPTT: 80 seconds — ABNORMAL HIGH (ref 24–36)
aPTT: 64 seconds — ABNORMAL HIGH (ref 24–36)

## 2020-02-20 LAB — PROCALCITONIN: Procalcitonin: 0.97 ng/mL

## 2020-02-20 LAB — HEPARIN LEVEL (UNFRACTIONATED): Heparin Unfractionated: 0.56 IU/mL (ref 0.30–0.70)

## 2020-02-20 MED ORDER — ARGATROBAN 50 MG/50ML IV SOLN
0.5000 ug/kg/min | INTRAVENOUS | Status: DC
  Administered 2020-02-20 (×2): 0.5 ug/kg/min via INTRAVENOUS
  Filled 2020-02-20 (×2): qty 50

## 2020-02-20 MED ORDER — VITAL AF 1.2 CAL PO LIQD
1000.0000 mL | ORAL | Status: DC
  Administered 2020-02-20: 1000 mL

## 2020-02-20 NOTE — Progress Notes (Signed)
CH had extended conversation w/pt.'s aunt Shawn Taylor @ bedside.  Pt. slightly more awake today compared to yesterday, acc. to aunt.  Aunt has been spending a few hours per day in rm., coordinating w/son Shawn Taylor to ensure pt. knows he has family present w/him.  Intensivist arrived midway through visit; explained to aunt that pt.'s heart is functioning at a low percentage and that this adversely affects his prognosis quite significantly.  Aunt grateful for medical team's work to treat pt.  She seemed concerned re: news of pt.'s serious condition but also to be accepting and coping effectively.  CH and aunt prayed for pt. @ end of visit. Per conversation w/RN [facility] contacted son Shawn Taylor to clarify how family has been making heathcare decsions re: pt.  Shawn Taylor says he has been point person --updates family after medical team gives him news.  He would be agreeable either to continuing to function as point person or to having a family meeting w/doctors/medical team.  Secure message sent to Dr. Karna Christmas and Palliative Care NP Valentina Lucks.  No further needs expressed @ this time.     02/20/20 1600  Clinical Encounter Type  Visited With Family;Patient not available  Visit Type Follow-up;Psychological support;Spiritual support;Social support;Critical Care  Referral From Other (Comment) (Routine Rounding)  Consult/Referral To Chaplain;Palliative care  Spiritual Encounters  Spiritual Needs Emotional;Prayer  Stress Factors  Family Stress Factors Major life changes;Health changes

## 2020-02-20 NOTE — Consult Note (Signed)
ANTICOAGULATION CONSULT NOTE - Initial Consult  Pharmacy Consult for Heparin infusion Indication: DVT  No Known Allergies  Patient Measurements: Height: 5\' 11"  (180.3 cm) Weight: 216 lb 11.4 oz (98.3 kg) IBW/kg (Calculated) : 75.3 Heparin Dosing Weight: 90.7 kg  Vital Signs: Temp: 98.6 F (37 C) (03/29 0400) Temp Source: Rectal (03/29 0400) BP: 129/79 (03/29 0300)  Labs: Recent Labs    02/18/20 0445 02/18/20 0445 02/18/20 1106 02/19/20 0430 02/20/20 0324  HGB 13.5   < >  --  13.8 13.9  HCT 40.0  --   --  41.4 41.9  PLT 99*  --   --  88* 77*  HEPARINUNFRC 0.36   < > 0.47 0.50 0.56  CREATININE 3.67*  --   --  2.79* 2.44*   < > = values in this interval not displayed.    Estimated Creatinine Clearance: 37 mL/min (A) (by C-G formula based on SCr of 2.44 mg/dL (H)).   Medical History: Past Medical History:  Diagnosis Date  . Hypertension   . Migraines     Medications:  -No anticoagulation prior to admission per chart -Last dose therapeutic enoxaparin 3/23 @ 0637  Assessment: Patient is a 64 y/o M with a history of hypertension, cocaine, tobacco, and alcohol use who presented to [facility] ED with new onset CHF and atrial flutter and subsequently went into cardiac arrest. Patient is intubated and mechanically ventilated for respiratory failure. Found to have multiple lower extremity DVTs on doppler. Initially started on therapeutic enoxaparin which was discontinued secondary to worsening renal function. Pharmacy has been consulted to initiate heparin infusion for treatment of VTE.   Baseline aPTT 45, INR 2.3 (drawn after first dose enoxaparin). Baseline H&H, platelets within normal limits.   Patient is in acute renal failure with oliguria.   3/27 0445 HL 0.36  3/27 1106 HL 0.47   Goal of Therapy:  Heparin level 0.3-0.7 units/ml Monitor platelets by anticoagulation protocol: Yes   Plan:  03/29 @ 0500 HL 0.56 therapeutic. Will continue current rate and will recheck  w/ am labs, plts dropping will continue to monitor.  4/29, PharmD, BCPS Clinical Pharmacist 7:58 AM

## 2020-02-20 NOTE — Plan of Care (Signed)
PMT note:  Called emergency contact, she is niece and states the patient has 2 children that are estranged. Plans to speak with family tomorrow to determine decision makers and arrange a family meeting.

## 2020-02-20 NOTE — Consult Note (Signed)
ANTICOAGULATION CONSULT NOTE  Pharmacy Consult for argatroban infusion Indication: DVT  Allergies  Allergen Reactions  . Heparin     Suspected HIT: labs pending    Patient Measurements: Height: 5\' 11"  (180.3 cm) Weight: 216 lb 11.4 oz (98.3 kg) IBW/kg (Calculated) : 75.3 Heparin Dosing Weight: 90.7 kg  Vital Signs: Temp: 96.6 F (35.9 C) (03/29 1400) BP: 143/78 (03/29 1700)  Labs: Recent Labs    02/18/20 0445 02/18/20 0445 02/18/20 1106 02/19/20 0430 02/20/20 0324 02/20/20 1454 02/20/20 1701  HGB 13.5   < >  --  13.8 13.9  --   --   HCT 40.0  --   --  41.4 41.9  --   --   PLT 99*  --   --  88* 77*  --   --   APTT  --   --   --   --   --  80* 64*  HEPARINUNFRC 0.36   < > 0.47 0.50 0.56  --   --   CREATININE 3.67*  --   --  2.79* 2.44*  --   --    < > = values in this interval not displayed.    Estimated Creatinine Clearance: 37 mL/min (A) (by C-G formula based on SCr of 2.44 mg/dL (H)).   Medical History: Past Medical History:  Diagnosis Date  . Hypertension   . Migraines     Medications:  No anticoagulation prior to admission per chart  Assessment: Patient is a 64 y/o M with a history of hypertension, cocaine, tobacco, and alcohol use who presented to [facility] ED with new onset CHF and atrial flutter and subsequently went into cardiac arrest. Patient is intubated and mechanically ventilated for respiratory failure. Found to have multiple lower extremity DVTs on doppler. Initially started on therapeutic enoxaparin which was discontinued secondary to worsening renal function. Pharmacy was then consulted to initiate a heparin infusion for treatment of VTE. However, his platelets have steadily decreased since heparin was started resulting in high suspicion for HIT  Goal of Therapy:  aPTT range 50 - 90 seconds Monitor platelets by anticoagulation protocol: Yes   Plan:   Repeat aPTT 64 therapeutic x 2  Continue argatroban at 0.5 mcg/kg/min  Daily aPTT and CBC  with morning labs  [facility], PharmD Clinical Pharmacist 5:50 PM

## 2020-02-20 NOTE — Progress Notes (Signed)
CRITICAL CARE PROGRESS NOTE    Name: Shawn Taylor MRN: [id number] DOB: Aug 26, 1956     LOS: 14   SUBJECTIVE FINDINGS & SIGNIFICANT EVENTS   Patient description:  64 yo male admitted s/p respiratory arrest followed by cardiac arrest, cardiogenic shock, pulmonary edema, severe metabolic acidosis, BLE DVT'sconcerning for possible pulmonary embolismsrequiring mechanical intubation and levophed gtt  Lines / Drains: PIVx2  Cultures / Sepsis markers: Blood cultures  Antibiotics: Unasyn   Protocols / Consultants: Cardio, PCCM   Events:  03/22: Pt admitted to the stepdown unit due to atrial flutter with rvr, acute hypoxic respiratory failure secondary to severe metabolic acidosis and pulmonary edema 03/22: Venous US Bilateral Extremity revealed occlusive thrombus within the right popliteal vein, posterior tibial vein, and peroneal veins. Occlusive thrombus within the left popliteal vein and left anterior peroneal vein. 3/22: While awaiting bed availability in the stepdown unit pt respiratory arrested which led to cardiac arrest ROSC obtained, pt mechanically intubation, and admitted to ICU 3/71mltiorgan failure, family at bedside updatedEF<20%. The left  ventricle has severely decreased function. The left ventricle demonstrates  global hypokinesis.  3/24remains critically ill, multiorgan failure 3/25remains critically ill, multiorgan failure, seems to be following some simple commands, mostly delirious, son at bedside updated 3/247fled weaning trials , multiorgan failure  3/27: failed weaning attempt    PAST MEDICAL HISTORY   Past Medical History:  Diagnosis Date  . Hypertension   . Migraines      SURGICAL HISTORY   History reviewed. No pertinent surgical history.   FAMILY HISTORY    History reviewed. No pertinent family history.   SOCIAL HISTORY   Social History   Tobacco Use  . Smoking status: Current Every Day Smoker    Packs/day: 0.50    Types: Cigarettes  . Smokeless tobacco: Never Used  Substance Use Topics  . Alcohol use: Yes    Alcohol/week: 2.0 - 3.0 standard drinks    Types: 2 - 3 Cans of beer per week  . Drug use: Yes    Frequency: 1.0 times per week    Types: Cocaine     MEDICATIONS   Current Medication:  Current Facility-Administered Medications:  .  acetaminophen (TYLENOL) tablet 650 mg, 650 mg, Oral, Q6H PRN **OR** acetaminophen (TYLENOL) suppository 650 mg, 650 mg, Rectal, Q6H PRN, AmKurtis BushmanSahar, MD .  argatroban 1 mg/mL infusion, 0.5 mcg/kg/min, Intravenous, Continuous, GrDallie PilesRPH, Last Rate: 2.95 mL/hr at 02/20/20 1148, 0.5 mcg/kg/min at 02/20/20 1148 .  bisacodyl (DULCOLAX) suppository 10 mg, 10 mg, Rectal, Daily PRN, BlAwilda BillNP .  budesonide (PULMICORT) nebulizer solution 0.5 mg, 0.5 mg, Nebulization, BID, BlAwilda BillNP, 0.5 mg at 02/20/20 0745 .  chlorhexidine gluconate (MEDLINE KIT) (PERIDEX) 0.12 % solution 15 mL, 15 mL, Mouth Rinse, BID, Blakeney, DaDreama SaaNP, 15 mL at 02/20/20 0842 .  Chlorhexidine Gluconate Cloth 2 % PADS 6 each, 6 each, Topical, Daily, BlAwilda BillNP, 6 each at 02/20/20 0914 .  docusate (COLACE) 50 MG/5ML liquid 100 mg, 100 mg, Per Tube, BID, BlAwilda BillNP, 100 mg at 02/20/20 0914 .  famotidine (PEPCID) IVPB 20 mg premix, 20 mg, Intravenous, QHS, SiCharlett NoseRPH, Stopped at 02/20/20 0025 .  feeding supplement (VITAL AF 1.2 CAL) liquid 1,000 mL, 1,000 mL, Per Tube, Q24H, Asberry Lascola, MD .  fentaNYL 250086min NS [id number]) infusion-PREMIX, 0-400 mcg/hr, Intravenous, Continuous, AmeryNolberto Hanlon Stopped at 02/20/20 0932 .  folic acid injection 1 mg,  1 mg, Intravenous, Daily, Awilda Bill, NP, 1 mg at 02/20/20 0914 .  ipratropium-albuterol (DUONEB)  0.5-2.5 (3) MG/3ML nebulizer solution 3 mL, 3 mL, Nebulization, Q6H, Blakeney, Dana G, NP, 3 mL at 02/20/20 1406 .  ipratropium-albuterol (DUONEB) 0.5-2.5 (3) MG/3ML nebulizer solution 3 mL, 3 mL, Nebulization, Q6H PRN, Awilda Bill, NP .  MEDLINE mouth rinse, 15 mL, Mouth Rinse, 10 times per day, Awilda Bill, NP, 15 mL at 02/20/20 1347 .  methylPREDNISolone sodium succinate (SOLU-MEDROL) 40 mg/mL injection 40 mg, 40 mg, Intravenous, Q12H, Blakeney, Dana G, NP, 40 mg at 02/20/20 1346 .  midazolam (VERSED) injection 2 mg, 2 mg, Intravenous, Q2H PRN, Darel Hong D, NP, 2 mg at 02/20/20 0630 .  multivitamin with minerals tablet 1 tablet, 1 tablet, Per Tube, Daily, Benita Gutter, RPH, 1 tablet at 02/20/20 7893 .  norepinephrine (LEVOPHED) 16 mg in 255m premix infusion, 0-40 mcg/min, Intravenous, Titrated, BAwilda Bill NP, Stopped at 02/16/20 1828 .  propofol (DIPRIVAN) 1000 MG/100ML infusion, 5-80 mcg/kg/min, Intravenous, Titrated, Kasa, Kurian, MD, Last Rate: 10.88 mL/hr at 02/20/20 1347, 20 mcg/kg/min at 02/20/20 1347 .  sennosides (SENOKOT) 8.8 MG/5ML syrup 5 mL, 5 mL, Per Tube, QHS, BAwilda Bill NP, 5 mL at 02/19/20 2355 .  sodium chloride flush (NS) 0.9 % injection 10-40 mL, 10-40 mL, Intracatheter, Q12H, Blakeney, Dana G, NP, 10 mL at 02/20/20 0915 .  sodium chloride flush (NS) 0.9 % injection 10-40 mL, 10-40 mL, Intracatheter, PRN, BAwilda Bill NP .  thiamine (B-1) injection 100 mg, 100 mg, Intravenous, Daily, Blakeney, DDreama Saa NP, 100 mg at 02/20/20 0913    ALLERGIES   Heparin    REVIEW OF SYSTEMS     10 point ROS unable to obtain due to sedation while critically ill  PHYSICAL EXAMINATION   Vital Signs: Temp:  [96.6 F (35.9 C)-99.7 F (37.6 C)] 96.6 F (35.9 C) (03/29 1400) Resp:  [18-25] 24 (03/29 1400) BP: (98-149)/(64-104) 134/86 (03/29 1400) SpO2:  [98 %-100 %] 98 % (03/29 1406) FiO2 (%):  [24 %-35 %] 24 % (03/29 1406)  GENERAL:NAD on  MV HEAD: Normocephalic, atraumatic.  EYES: Pupils equal, round, reactive to light.  No scleral icterus.  MOUTH: Moist mucosal membrane. NECK: Supple. No thyromegaly. No nodules. No JVD.  PULMONARY: mild rhonchi bl CARDIOVASCULAR: S1 and S2. Regular rate and rhythm. No murmurs, rubs, or gallops.  GASTROINTESTINAL: Soft, nontender, non-distended. No masses. Positive bowel sounds. No hepatosplenomegaly.  MUSCULOSKELETAL: No swelling, clubbing, or edema.  NEUROLOGIC: Mild distress due to acute illness, GCS4T SKIN:intact,warm,dry   PERTINENT DATA     Infusions: . argatroban 0.5 mcg/kg/min (02/20/20 1148)  . famotidine (PEPCID) IV Stopped (02/20/20 0025)  . fentaNYL infusion INTRAVENOUS Stopped (02/20/20 0932)  . norepinephrine (LEVOPHED) Adult infusion Stopped (02/16/20 1828)  . propofol (DIPRIVAN) infusion 20 mcg/kg/min (02/20/20 1347)   Scheduled Medications: . budesonide (PULMICORT) nebulizer solution  0.5 mg Nebulization BID  . chlorhexidine gluconate (MEDLINE KIT)  15 mL Mouth Rinse BID  . Chlorhexidine Gluconate Cloth  6 each Topical Daily  . docusate  100 mg Per Tube BID  . feeding supplement (VITAL AF 1.2 CAL)  1,000 mL Per Tube Q24H  . folic acid  1 mg Intravenous Daily  . ipratropium-albuterol  3 mL Nebulization Q6H  . mouth rinse  15 mL Mouth Rinse 10 times per day  . methylPREDNISolone (SOLU-MEDROL) injection  40 mg Intravenous Q12H  . multivitamin with minerals  1 tablet Per Tube  Daily  . sennosides  5 mL Per Tube QHS  . sodium chloride flush  10-40 mL Intracatheter Q12H  . thiamine injection  100 mg Intravenous Daily   PRN Medications: acetaminophen **OR** acetaminophen, bisacodyl, ipratropium-albuterol, midazolam, sodium chloride flush Hemodynamic parameters:   Intake/Output: 03/28 0701 - 03/29 0700 In: 1635.7 [I.V.:1295.3; IV Piggyback:340.4] Out: 2480 [Urine:2255; Emesis/NG output:225]  Ventilator  Settings: Vent Mode: PRVC FiO2 (%):  [24 %-35 %] 24 % Set  Rate:  [8 bmp-24 bmp] 24 bmp Vt Set:  [450 mL] 450 mL PEEP:  [5 cmH20] 5 cmH20 Pressure Support:  [12 cmH20] 12 cmH20 Plateau Pressure:  [20 cmH20-22 cmH20] 20 cmH20    LAB RESULTS:  Basic Metabolic Panel: Recent Labs  Lab 02/14/20 0109 02/14/20 0521 02/15/20 0235 02/15/20 0235 02/16/20 0430 02/16/20 0430 02/17/20 0450 02/17/20 0450 02/18/20 0445 02/18/20 0445 02/19/20 0430 02/20/20 0324  NA 129*   < > 134*   < > 133*  --  135  --  134*  --  138 141  K 5.2*   < > 4.8   < > 4.5   < > 4.4   < > 3.9   < > 3.7 3.7  CL 90*   < > 87*   < > 83*  --  87*  --  89*  --  96* 98  CO2 16*   < > 23   < > 28  --  33*  --  34*  --  32 30  GLUCOSE 191*   < > 181*   < > 189*  --  148*  --  161*  --  140* 164*  BUN 50*   < > 65*   < > 74*  --  83*  --  78*  --  68* 64*  CREATININE 3.17*   < > 4.56*   < > 5.24*  --  4.73*  --  3.67*  --  2.79* 2.44*  CALCIUM 8.5*   < > 7.3*   < > 6.9*  --  7.8*  --  8.3*  --  8.3* 8.4*  MG 2.6*  --  2.1  --   --   --   --   --  1.9  --  1.8 1.8  PHOS 11.3*  --  11.4*  --   --   --   --   --  5.4*  --  4.8* 5.7*   < > = values in this interval not displayed.   Liver Function Tests: Recent Labs  Lab 02/16/20 0430 02/17/20 0450 02/18/20 0445 02/19/20 0430 02/20/20 0324  AST 2,050* 1,069* 530* 274* 139*  ALT 1,261* 1,080* 802* 613* 420*  ALKPHOS 118 116 107 101 92  BILITOT 3.0* 3.5* 3.2* 3.2* 3.3*  PROT 6.3* 6.0* 5.9* 5.8* 5.9*  ALBUMIN 2.6* 2.4* 2.4* 2.3* 2.3*   No results for input(s): LIPASE, AMYLASE in the last 168 hours. Recent Labs  Lab 02/15/20 1110  AMMONIA 66*   CBC: Recent Labs  Lab 02/16/20 0430 02/17/20 0450 02/18/20 0445 02/19/20 0430 02/20/20 0324  WBC 18.1* 12.4* 9.3 8.7 8.3  NEUTROABS 15.8* 10.6* 8.0* 7.3 7.0  HGB 14.2 13.5 13.5 13.8 13.9  HCT 43.4 39.8 40.0 41.4 41.9  MCV 91.9 90.7 88.1 90.6 90.9  PLT 152 121* 99* 88* 77*   Cardiac Enzymes: No results for input(s): CKTOTAL, CKMB, CKMBINDEX, TROPONINI in the last 168  hours. BNP: Invalid input(s): POCBNP CBG: Recent Labs  Lab 02/19/20 1934  02/19/20 2006 02/19/20 2348 02/20/20 0715 02/20/20 1129  GLUCAP 140* 154* 201* 144* 154*     IMAGING RESULTS:     ASSESSMENT AND PLAN    -Multidisciplinary rounds held today  Acute Hypoxic Respiratory Failure -possibly due to flash pulmonary edema vs PE  -continue Bronchodilator Therapy -Wean Fio2 and PEEP as tolerated -will perform SAT/SBT when respiratory parameters are met -discussed with Niece Ingvald Theisen today -patient having hemoptysis from ETT - will consider d/c anticoagulation for now switch to argatroban due to thrombocytopenia  Acute on chronic systolic CHF with BM<84% -background of accelerated Atrial fultter  -cardiology on case - appreciate input  - BNP >900 trending to 500 -oxygen as needed -Lasix as tolerated -follow up cardiac enzymes as indicated ICU telemetry  monitoring  Renal Failure-most likely due to ATN -follow chem 7 -follow UO -continue Foley Catheter-assess need daily -nephrology on case  NEUROLOGY - intubated and sedated - minimal sedation to achieve a RASS goal: -1 Wake up assessment pending   ID -continue IV abx as prescibed -follow up cultures  GI/Nutrition GI PROPHYLAXIS as indicated DIET-->TF's as tolerated Constipation protocol as indicated  ENDO - ICU hypoglycemic\Hyperglycemia protocol -check FSBS per protocol   ELECTROLYTES -follow labs as needed -replace as needed -pharmacy consultation   DVT/GI PRX ordered -SCDs  TRANSFUSIONS AS NEEDED MONITOR FSBS ASSESS the need for LABS as needed   Critical care provider statement:    Critical care time (minutes):  33   Critical care time was exclusive of:  Separately billable procedures and treating other patients   Critical care was necessary to treat or prevent imminent or life-threatening deterioration of the following conditions:  acute hypoxemic resp failure, CHF, AFL, AKI,  transaminitis, multiple comorbid conditions   Critical care was time spent personally by me on the following activities:  Development of treatment plan with patient or surrogate, discussions with consultants, evaluation of patient's response to treatment, examination of patient, obtaining history from patient or surrogate, ordering and performing treatments and interventions, ordering and review of laboratory studies and re-evaluation of patient's condition.  I assumed direction of critical care for this patient from another provider in my specialty: no    This document was prepared using Dragon voice recognition software and may include unintentional dictation errors.    Ottie Glazier, M.D.  Division of [facility]

## 2020-02-20 NOTE — Consult Note (Signed)
ANTICOAGULATION CONSULT NOTE  Pharmacy Consult for argatroban infusion Indication: DVT  No Known Allergies  Patient Measurements: Height: 5\' 11"  (180.3 cm) Weight: 216 lb 11.4 oz (98.3 kg) IBW/kg (Calculated) : 75.3 Heparin Dosing Weight: 90.7 kg  Vital Signs: Temp: 98.6 F (37 C) (03/29 0400) Temp Source: Rectal (03/29 0400) BP: 129/79 (03/29 0300)  Labs: Recent Labs    02/18/20 0445 02/18/20 0445 02/18/20 1106 02/19/20 0430 02/20/20 0324  HGB 13.5   < >  --  13.8 13.9  HCT 40.0  --   --  41.4 41.9  PLT 99*  --   --  88* 77*  HEPARINUNFRC 0.36   < > 0.47 0.50 0.56  CREATININE 3.67*  --   --  2.79* 2.44*   < > = values in this interval not displayed.    Estimated Creatinine Clearance: 37 mL/min (A) (by C-G formula based on SCr of 2.44 mg/dL (H)).   Medical History: Past Medical History:  Diagnosis Date  . Hypertension   . Migraines     Medications:  No anticoagulation prior to admission per chart  Assessment: Patient is a 64 y/o M with a history of hypertension, cocaine, tobacco, and alcohol use who presented to [facility] ED with new onset CHF and atrial flutter and subsequently went into cardiac arrest. Patient is intubated and mechanically ventilated for respiratory failure. Found to have multiple lower extremity DVTs on doppler. Initially started on therapeutic enoxaparin which was discontinued secondary to worsening renal function. Pharmacy was then consulted to initiate a heparin infusion for treatment of VTE. However, his platelets have steadily decreased since heparin was started resulting in high suspicion for HIT  Goal of Therapy:  aPTT range 50 - 90 seconds Monitor platelets by anticoagulation protocol: Yes   Plan:   Stopped heparin infusion  Started argatroban at 0.5 mcg/kg/min  Checked aPTT 2 hours after infusion was started  1st 2 hour aPTT level:  80 seconds  continue argatroban at current rate and recheck aPTT in 2 hours  If second aPTT is  therapeutic next level can be drawn with am labs 3/30  4/30, PharmD, BCPS Clinical Pharmacist 7:50 AM

## 2020-02-21 LAB — CBC WITH DIFFERENTIAL/PLATELET
Abs Immature Granulocytes: 0.15 10*3/uL — ABNORMAL HIGH (ref 0.00–0.07)
Basophils Absolute: 0 10*3/uL (ref 0.0–0.1)
Basophils Relative: 0 %
Eosinophils Absolute: 0 10*3/uL (ref 0.0–0.5)
Eosinophils Relative: 0 %
HCT: 44.6 % (ref 39.0–52.0)
Hemoglobin: 14.4 g/dL (ref 13.0–17.0)
Immature Granulocytes: 2 %
Lymphocytes Relative: 4 %
Lymphs Abs: 0.4 10*3/uL — ABNORMAL LOW (ref 0.7–4.0)
MCH: 29.9 pg (ref 26.0–34.0)
MCHC: 32.3 g/dL (ref 30.0–36.0)
MCV: 92.5 fL (ref 80.0–100.0)
Monocytes Absolute: 0.7 10*3/uL (ref 0.1–1.0)
Monocytes Relative: 7 %
Neutro Abs: 8.2 10*3/uL — ABNORMAL HIGH (ref 1.7–7.7)
Neutrophils Relative %: 87 %
Platelets: 69 10*3/uL — ABNORMAL LOW (ref 150–400)
RBC: 4.82 MIL/uL (ref 4.22–5.81)
RDW: 14.9 % (ref 11.5–15.5)
WBC: 9.5 10*3/uL (ref 4.0–10.5)
nRBC: 0.4 % — ABNORMAL HIGH (ref 0.0–0.2)

## 2020-02-21 LAB — COMPREHENSIVE METABOLIC PANEL
ALT: 307 U/L — ABNORMAL HIGH (ref 0–44)
AST: 85 U/L — ABNORMAL HIGH (ref 15–41)
Albumin: 2.5 g/dL — ABNORMAL LOW (ref 3.5–5.0)
Alkaline Phosphatase: 92 U/L (ref 38–126)
Anion gap: 13 (ref 5–15)
BUN: 61 mg/dL — ABNORMAL HIGH (ref 8–23)
CO2: 29 mmol/L (ref 22–32)
Calcium: 8.7 mg/dL — ABNORMAL LOW (ref 8.9–10.3)
Chloride: 102 mmol/L (ref 98–111)
Creatinine, Ser: 2.26 mg/dL — ABNORMAL HIGH (ref 0.61–1.24)
GFR calc Af Amer: 34 mL/min — ABNORMAL LOW (ref >60)
GFR calc non Af Amer: 30 mL/min — ABNORMAL LOW (ref >60)
Glucose, Bld: 182 mg/dL — ABNORMAL HIGH (ref 70–99)
Potassium: 4 mmol/L (ref 3.5–5.1)
Sodium: 144 mmol/L (ref 135–145)
Total Bilirubin: 3.3 mg/dL — ABNORMAL HIGH (ref 0.3–1.2)
Total Protein: 6 g/dL — ABNORMAL LOW (ref 6.5–8.1)

## 2020-02-21 LAB — GLUCOSE, CAPILLARY
Glucose-Capillary: 113 mg/dL — ABNORMAL HIGH (ref 70–99)
Glucose-Capillary: 121 mg/dL — ABNORMAL HIGH (ref 70–99)
Glucose-Capillary: 156 mg/dL — ABNORMAL HIGH (ref 70–99)
Glucose-Capillary: 178 mg/dL — ABNORMAL HIGH (ref 70–99)
Glucose-Capillary: 192 mg/dL — ABNORMAL HIGH (ref 70–99)

## 2020-02-21 LAB — HEPARIN INDUCED PLATELET AB (HIT ANTIBODY): Heparin Induced Plt Ab: 0.081 OD (ref 0.000–0.400)

## 2020-02-21 LAB — PHOSPHORUS: Phosphorus: 5.3 mg/dL — ABNORMAL HIGH (ref 2.5–4.6)

## 2020-02-21 LAB — APTT: aPTT: 60 seconds — ABNORMAL HIGH (ref 24–36)

## 2020-02-21 LAB — MAGNESIUM: Magnesium: 1.9 mg/dL (ref 1.7–2.4)

## 2020-02-21 LAB — PROCALCITONIN: Procalcitonin: 0.65 ng/mL

## 2020-02-21 MED ORDER — FUROSEMIDE 10 MG/ML IJ SOLN
40.0000 mg | Freq: Every day | INTRAMUSCULAR | Status: DC
  Administered 2020-02-21 – 2020-02-25 (×5): 40 mg via INTRAVENOUS
  Filled 2020-02-21 (×5): qty 4

## 2020-02-21 MED ORDER — CHLORHEXIDINE GLUCONATE 0.12 % MT SOLN
15.0000 mL | Freq: Two times a day (BID) | OROMUCOSAL | Status: DC
  Administered 2020-02-21 – 2020-02-29 (×15): 15 mL via OROMUCOSAL
  Filled 2020-02-21 (×15): qty 15

## 2020-02-21 MED ORDER — LABETALOL HCL 5 MG/ML IV SOLN
10.0000 mg | INTRAVENOUS | Status: DC | PRN
  Administered 2020-02-21 (×3): 10 mg via INTRAVENOUS
  Filled 2020-02-21 (×3): qty 4

## 2020-02-21 MED ORDER — ORAL CARE MOUTH RINSE
15.0000 mL | Freq: Two times a day (BID) | OROMUCOSAL | Status: DC
  Administered 2020-02-21 – 2020-03-06 (×23): 15 mL via OROMUCOSAL

## 2020-02-21 NOTE — Progress Notes (Signed)
CH beckoned into rm. by pt.'s Charisse Klinefelter; pt. extubated and sitting up in bed awake and relatively alert.  Pt. able to speak and expressed his gratitude for being off vent.  Aunt also very glad to see pt.'s apparent improvement; pt. asked for something to drink and RN gave him lemon swabs to moisten mouth.  Palliative Care NP arrived to discuss pt.'s care and CH excused himself to address another situaion; Ch will continue to monitor pt. and family needs and will follow up as needed.     02/21/20 1520  Clinical Encounter Type  Visited With Patient and family together;Health care provider  Visit Type Follow-up;Psychological support;Social support;Critical Care  Referral From Other (Comment) (Routine Rounding)  Consult/Referral To Chaplain  Spiritual Encounters  Spiritual Needs Emotional  Stress Factors  Family Stress Factors Health changes

## 2020-02-21 NOTE — Progress Notes (Signed)
Pt is continuously coughing up blood. Oral care performed. MD made aware. Argatroban to be discontinued due to bleeding.

## 2020-02-21 NOTE — Progress Notes (Signed)
Pt extubated without complications, no stridor noted, placed on 3lpm [place], sats 97%, respiratory rate 18/min, will continue to monitor.

## 2020-02-21 NOTE — Consult Note (Signed)
ANTICOAGULATION CONSULT NOTE  Pharmacy Consult for argatroban infusion Indication: DVT  Allergies  Allergen Reactions  . Heparin     Suspected HIT: labs pending    Patient Measurements: Height: 5\' 11"  (180.3 cm) Weight: 220 lb 3.8 oz (99.9 kg) IBW/kg (Calculated) : 75.3 Heparin Dosing Weight: 90.7 kg  Vital Signs: Temp: 98.8 F (37.1 C) (03/30 0500) Temp Source: Rectal (03/30 0323) BP: 138/79 (03/30 0500) Pulse Rate: 78 (03/30 0323)  Labs: Recent Labs    02/18/20 1106 02/19/20 0430 02/19/20 0430 02/20/20 0324 02/20/20 1454 02/20/20 1701 02/21/20 0416  HGB  --  13.8   < > 13.9  --   --  14.4  HCT  --  41.4  --  41.9  --   --  44.6  PLT  --  88*  --  77*  --   --  69*  APTT  --   --   --   --  80* 64* 60*  HEPARINUNFRC 0.47 0.50  --  0.56  --   --   --   CREATININE  --  2.79*  --  2.44*  --   --  2.26*   < > = values in this interval not displayed.    Estimated Creatinine Clearance: 40.3 mL/min (A) (by C-G formula based on SCr of 2.26 mg/dL (H)).   Medical History: Past Medical History:  Diagnosis Date  . Hypertension   . Migraines     Medications:  No anticoagulation prior to admission per chart  Assessment: Patient is a 64 y/o M with a history of hypertension, cocaine, tobacco, and alcohol use who presented to [facility] ED with new onset CHF and atrial flutter and subsequently went into cardiac arrest. Patient is intubated and mechanically ventilated for respiratory failure. Found to have multiple lower extremity DVTs on doppler. Initially started on therapeutic enoxaparin which was discontinued secondary to worsening renal function. Pharmacy was then consulted to initiate a heparin infusion for treatment of VTE. However, his platelets have steadily decreased since heparin was started resulting in high suspicion for HIT  Goal of Therapy:  aPTT range 50 - 90 seconds Monitor platelets by anticoagulation protocol: Yes   Plan:   Repeat aPTT 60 therapeutic x 3,  H/H stable, PLTs slightly lower  Continue argatroban at 0.5 mcg/kg/min  Daily aPTT and CBC with morning labs  [facility], PharmD Clinical Pharmacist 5:43 AM

## 2020-02-21 NOTE — Progress Notes (Signed)
Nutrition Follow-up  DOCUMENTATION CODES:   Not applicable  INTERVENTION:  Once diet able to be advanced provide Ensure Enlive po BID, each supplement provides 350 kcal and 20 grams of protein.  NUTRITION DIAGNOSIS:   Inadequate oral intake related to inability to eat as evidenced by NPO status.  Ongoing.  GOAL:   Patient will meet greater than or equal to 90% of their needs  Not met at this time.  MONITOR:   Diet advancement, PO intake, Supplement acceptance, Labs, Weight trends, I & O's  REASON FOR ASSESSMENT:   Ventilator    ASSESSMENT:   64 year old male with PMHx of migraines, HTN admitted s/p respiratory arrest followed by cardiac arrest, cardiogenic shock, pulmonary edema, severe metabolic acidosis, bilateral lower extremity DVTs concerning for possible PE requiring intubation.  Patient had emesis on 3/26 and tube feeds were stopped. They were resumed at trickle rate on 3/29. Patient was extubated this morning. Patient was placed on BiPAP following extubation. In setting of catabolic nature of critical illness patient will benefit from oral nutrition supplement once diet able to be advanced.  Medications reviewed and include: Colace, folic acid 1 mg daily IV, Lasix 40 mg daily IV, Solu-Medrol 40 mg Q12hrs IV, MVI daily, thiamine 100 mg daily IV, argatroban infusion.  Labs reviewed: CBG 151-192, BUN 61, Creatinine 2.26, Phosphorus 5.3.  Diet Order:   Diet Order    None     EDUCATION NEEDS:   No education needs have been identified at this time  Skin:  Skin Assessment: Reviewed RN Assessment  Last BM:  02/21/2020 small type 7  Height:   Ht Readings from Last 1 Encounters:  02/13/20 5' 11"  (1.803 m)   Weight:   Wt Readings from Last 1 Encounters:  02/21/20 99.9 kg   Ideal Body Weight:  78.2 kg  BMI:  Body mass index is 30.72 kg/m.  Estimated Nutritional Needs:   Kcal:  2000-2200  Protein:  110-120 grams  Fluid:  2 L/day  Jacklynn Barnacle, MS,  RD, LDN Pager number available on Amion

## 2020-02-21 NOTE — Progress Notes (Signed)
CRITICAL CARE PROGRESS NOTE    Name: Shawn Taylor MRN: [id number] DOB: 1956-07-21     LOS: 39   SUBJECTIVE FINDINGS & SIGNIFICANT EVENTS   Patient description:  64 yo male admitted s/p respiratory arrest followed by cardiac arrest, cardiogenic shock, pulmonary edema, severe metabolic acidosis, BLE DVT'sconcerning for possible pulmonary embolismsrequiring mechanical intubation and levophed gtt  Lines / Drains: PIVx2  Cultures / Sepsis markers: Blood cultures  Antibiotics: Unasyn   Protocols / Consultants: Cardio, PCCM   Events:  03/22: Pt admitted to the stepdown unit due to atrial flutter with rvr, acute hypoxic respiratory failure secondary to severe metabolic acidosis and pulmonary edema 03/22: Venous US Bilateral Extremity revealed occlusive thrombus within the right popliteal vein, posterior tibial vein, and peroneal veins. Occlusive thrombus within the left popliteal vein and left anterior peroneal vein. 3/22: While awaiting bed availability in the stepdown unit pt respiratory arrested which led to cardiac arrest ROSC obtained, pt mechanically intubation, and admitted to ICU 3/10mltiorgan failure, family at bedside updatedEF<20%. The left  ventricle has severely decreased function. The left ventricle demonstrates  global hypokinesis.  3/24remains critically ill, multiorgan failure 3/25remains critically ill, multiorgan failure, seems to be following some simple commands, mostly delirious, son at bedside updated 3/237fled weaning trials , multiorgan failure  3/27: failed weaning attempt 3/30- extubated to BIPAP   PAST MEDICAL HISTORY   Past Medical History:  Diagnosis Date  . Hypertension   . Migraines      SURGICAL HISTORY   History reviewed. No pertinent surgical  history.   FAMILY HISTORY   History reviewed. No pertinent family history.   SOCIAL HISTORY   Social History   Tobacco Use  . Smoking status: Current Every Day Smoker    Packs/day: 0.50    Types: Cigarettes  . Smokeless tobacco: Never Used  Substance Use Topics  . Alcohol use: Yes    Alcohol/week: 2.0 - 3.0 standard drinks    Types: 2 - 3 Cans of beer per week  . Drug use: Yes    Frequency: 1.0 times per week    Types: Cocaine     MEDICATIONS   Current Medication:  Current Facility-Administered Medications:  .  acetaminophen (TYLENOL) tablet 650 mg, 650 mg, Oral, Q6H PRN **OR** acetaminophen (TYLENOL) suppository 650 mg, 650 mg, Rectal, Q6H PRN, AmKurtis BushmanSahar, MD .  argatroban 1 mg/mL infusion, 0.5 mcg/kg/min, Intravenous, Continuous, GrDallie PilesRPH, Last Rate: 2.95 mL/hr at 02/21/20 1130, 0.5 mcg/kg/min at 02/21/20 1130 .  bisacodyl (DULCOLAX) suppository 10 mg, 10 mg, Rectal, Daily PRN, BlAwilda BillNP .  chlorhexidine (PERIDEX) 0.12 % solution 15 mL, 15 mL, Mouth Rinse, BID, AlLanney GinsFuad, MD .  Chlorhexidine Gluconate Cloth 2 % PADS 6 each, 6 each, Topical, Daily, BlAwilda BillNP, 6 each at 02/20/20 0914 .  docusate (COLACE) 50 MG/5ML liquid 100 mg, 100 mg, Per Tube, BID, BlAwilda BillNP, 100 mg at 02/20/20 2059 .  folic acid injection 1 mg, 1 mg, Intravenous, Daily, BlAwilda BillNP, 1 mg at 02/20/20 0914 .  furosemide (LASIX) injection 40 mg, 40 mg, Intravenous, Daily, AlOttie GlazierMD, 40 mg at 02/21/20 1123 .  labetalol (NORMODYNE) injection 10 mg, 10 mg, Intravenous, Q2H PRN, AlOttie GlazierMD, 10 mg at 02/21/20 1124 .  MEDLINE mouth rinse, 15 mL, Mouth Rinse, q12n4p, Viktoriya Glaspy, MD .  methylPREDNISolone sodium succinate (SOLU-MEDROL) 40 mg/mL injection 40 mg, 40 mg, Intravenous, Q12H, Blakeney, DaDreama SaaNP, 40 mg  at 02/21/20 0229 .  midazolam (VERSED) injection 2 mg, 2 mg, Intravenous, Q2H PRN, Darel Hong D, NP, 2 mg at  02/20/20 0630 .  multivitamin with minerals tablet 1 tablet, 1 tablet, Per Tube, Daily, Benita Gutter, RPH, 1 tablet at 02/20/20 5631 .  sennosides (SENOKOT) 8.8 MG/5ML syrup 5 mL, 5 mL, Per Tube, QHS, Awilda Bill, NP, 5 mL at 02/20/20 2101 .  sodium chloride flush (NS) 0.9 % injection 10-40 mL, 10-40 mL, Intracatheter, Q12H, Blakeney, Dreama Saa, NP, 10 mL at 02/21/20 0928 .  sodium chloride flush (NS) 0.9 % injection 10-40 mL, 10-40 mL, Intracatheter, PRN, Awilda Bill, NP .  thiamine (B-1) injection 100 mg, 100 mg, Intravenous, Daily, Awilda Bill, NP, 100 mg at 02/21/20 1124    ALLERGIES   Heparin    REVIEW OF SYSTEMS     10 point ROS unable to obtain due to sedation while critically ill  PHYSICAL EXAMINATION   Vital Signs: Temp:  [96.6 F (35.9 C)-99.5 F (37.5 C)] 99.5 F (37.5 C) (03/30 1130) Pulse Rate:  [78] 78 (03/30 0323) Resp:  [15-24] 15 (03/30 1130) BP: (121-180)/(69-120) 159/120 (03/30 1100) SpO2:  [96 %-100 %] 100 % (03/30 1130) FiO2 (%):  [24 %] 24 % (03/30 0900) Weight:  [99.9 kg] 99.9 kg (03/30 0328)  GENERAL:NAD on MV HEAD: Normocephalic, atraumatic.  EYES: Pupils equal, round, reactive to light.  No scleral icterus.  MOUTH: Moist mucosal membrane. NECK: Supple. No thyromegaly. No nodules. No JVD.  PULMONARY: mild rhonchi bl CARDIOVASCULAR: S1 and S2. Regular rate and rhythm. No murmurs, rubs, or gallops.  GASTROINTESTINAL: Soft, nontender, non-distended. No masses. Positive bowel sounds. No hepatosplenomegaly.  MUSCULOSKELETAL: No swelling, clubbing, or edema.  NEUROLOGIC: Mild distress due to acute illness, GCS4T SKIN:intact,warm,dry   PERTINENT DATA     Infusions: . argatroban 0.5 mcg/kg/min (02/21/20 1130)   Scheduled Medications: . chlorhexidine  15 mL Mouth Rinse BID  . Chlorhexidine Gluconate Cloth  6 each Topical Daily  . docusate  100 mg Per Tube BID  . folic acid  1 mg Intravenous Daily  . furosemide  40 mg  Intravenous Daily  . mouth rinse  15 mL Mouth Rinse q12n4p  . methylPREDNISolone (SOLU-MEDROL) injection  40 mg Intravenous Q12H  . multivitamin with minerals  1 tablet Per Tube Daily  . sennosides  5 mL Per Tube QHS  . sodium chloride flush  10-40 mL Intracatheter Q12H  . thiamine injection  100 mg Intravenous Daily   PRN Medications: acetaminophen **OR** acetaminophen, bisacodyl, labetalol, midazolam, sodium chloride flush Hemodynamic parameters:   Intake/Output: 03/29 0701 - 03/30 0700 In: 1327.8 [I.V.:1050.1; NG/GT:227.7; IV Piggyback:50] Out: 2000 [Urine:2000]  Ventilator  Settings: Vent Mode: Spontaneous FiO2 (%):  [24 %] 24 % Set Rate:  [24 bmp] 24 bmp Vt Set:  [450 mL] 450 mL PEEP:  [5 cmH20] 5 cmH20 Pressure Support:  [5 cmH20-10 cmH20] 5 cmH20 Plateau Pressure:  [15 cmH20] 15 cmH20    LAB RESULTS:  Basic Metabolic Panel: Recent Labs  Lab 02/15/20 0235 02/16/20 0430 02/17/20 0450 02/17/20 0450 02/18/20 0445 02/18/20 0445 02/19/20 0430 02/19/20 0430 02/20/20 0324 02/21/20 0416  NA 134*   < > 135  --  134*  --  138  --  141 144  K 4.8   < > 4.4   < > 3.9   < > 3.7   < > 3.7 4.0  CL 87*   < > 87*  --  89*  --  96*  --  98 102  CO2 23   < > 33*  --  34*  --  32  --  30 29  GLUCOSE 181*   < > 148*  --  161*  --  140*  --  164* 182*  BUN 65*   < > 83*  --  78*  --  68*  --  64* 61*  CREATININE 4.56*   < > 4.73*  --  3.67*  --  2.79*  --  2.44* 2.26*  CALCIUM 7.3*   < > 7.8*  --  8.3*  --  8.3*  --  8.4* 8.7*  MG 2.1  --   --   --  1.9  --  1.8  --  1.8 1.9  PHOS 11.4*  --   --   --  5.4*  --  4.8*  --  5.7* 5.3*   < > = values in this interval not displayed.   Liver Function Tests: Recent Labs  Lab 02/17/20 0450 02/18/20 0445 02/19/20 0430 02/20/20 0324 02/21/20 0416  AST 1,069* 530* 274* 139* 85*  ALT 1,080* 802* 613* 420* 307*  ALKPHOS 116 107 101 92 92  BILITOT 3.5* 3.2* 3.2* 3.3* 3.3*  PROT 6.0* 5.9* 5.8* 5.9* 6.0*  ALBUMIN 2.4* 2.4* 2.3* 2.3*  2.5*   No results for input(s): LIPASE, AMYLASE in the last 168 hours. Recent Labs  Lab 02/15/20 1110  AMMONIA 66*   CBC: Recent Labs  Lab 02/17/20 0450 02/18/20 0445 02/19/20 0430 02/20/20 0324 02/21/20 0416  WBC 12.4* 9.3 8.7 8.3 9.5  NEUTROABS 10.6* 8.0* 7.3 7.0 8.2*  HGB 13.5 13.5 13.8 13.9 14.4  HCT 39.8 40.0 41.4 41.9 44.6  MCV 90.7 88.1 90.6 90.9 92.5  PLT 121* 99* 88* 77* 69*   Cardiac Enzymes: No results for input(s): CKTOTAL, CKMB, CKMBINDEX, TROPONINI in the last 168 hours. BNP: Invalid input(s): POCBNP CBG: Recent Labs  Lab 02/20/20 2050 02/20/20 2339 02/21/20 0414 02/21/20 0725 02/21/20 1128  GLUCAP 146* 151* 156* 192* 178*     IMAGING RESULTS:     ASSESSMENT AND PLAN    -Multidisciplinary rounds held today  Acute Hypoxic Respiratory Failure -possibly due to flash pulmonary edema vs PE  -continue Bronchodilator Therapy -Wean Fio2 and PEEP as tolerated -will perform SAT/SBT when respiratory parameters are met -discussed with Niece Doyl Bitting today -patient having hemoptysis from ETT - will consider d/c anticoagulation for now switch to argatroban due to thrombocytopenia  Acute on chronic systolic CHF with CV<89% -background of accelerated Atrial fultter  -cardiology on case - appreciate input  - BNP >900 trending to 500 -oxygen as needed -Lasix as tolerated -follow up cardiac enzymes as indicated ICU telemetry  monitoring  Renal Failure-most likely due to ATN -follow chem 7 -follow UO -continue Foley Catheter-assess need daily -nephrology on case  NEUROLOGY - intubated and sedated - minimal sedation to achieve a RASS goal: -1 Wake up assessment pending   ID -continue IV abx as prescibed -follow up cultures  GI/Nutrition GI PROPHYLAXIS as indicated DIET-->TF's as tolerated Constipation protocol as indicated  ENDO - ICU hypoglycemic\Hyperglycemia protocol -check FSBS per protocol   ELECTROLYTES -follow labs as  needed -replace as needed -pharmacy consultation   DVT/GI PRX ordered -SCDs  TRANSFUSIONS AS NEEDED MONITOR FSBS ASSESS the need for LABS as needed   Critical care provider statement:    Critical care time (minutes):  33   Critical care time was  exclusive of:  Separately billable procedures and treating other patients   Critical care was necessary to treat or prevent imminent or life-threatening deterioration of the following conditions:  acute hypoxemic resp failure, CHF, AFL, AKI, transaminitis, multiple comorbid conditions   Critical care was time spent personally by me on the following activities:  Development of treatment plan with patient or surrogate, discussions with consultants, evaluation of patient's response to treatment, examination of patient, obtaining history from patient or surrogate, ordering and performing treatments and interventions, ordering and review of laboratory studies and re-evaluation of patient's condition.  I assumed direction of critical care for this patient from another provider in my specialty: no    This document was prepared using Dragon voice recognition software and may include unintentional dictation errors.    Ottie Glazier, M.D.  Division of [place]

## 2020-02-21 NOTE — Consult Note (Signed)
Consultation Note Date: 02/21/2020   Patient Name: Shawn Taylor  DOB: [birth year]  MRN: [id number]  Age / Sex: 64 y.o., male  PCP: Patient, No Pcp Per Referring Physician: Flora Lipps, MD  Reason for Consultation: Establishing goals of care  HPI/Patient Profile: 64 yo male admitted s/p respiratory arrest followed by cardiac arrest, cardiogenic shock, pulmonary edema, severe metabolic acidosis, BLE DVT'sconcerning for possible pulmonary embolismsrequiring mechanical intubation and levophed gtt. Now extubated.   Clinical Assessment and Goals of Care: Patient is resting in bed. He is extubated. He is conversive. He feels blessed to be alive. His aunt is at the bedside. She states he has 7 children, but she only knows 2 of them, Marcus and [employer] She states he spent a lot of time incarcerated and living with various friends or people he knew over the years.   He uses no assistive devices. He loves his independence.   We discussed his diagnosis, prognosis, GOC, EOL wishes disposition and options.  A detailed discussion was had today regarding advanced directives.  Concepts specific to code status, artifical feeding and hydration, IV antibiotics and rehospitalization were discussed.  The difference between an aggressive medical intervention path and a comfort care path was discussed.  Values and goals of care important to patient and family were attempted to be elicited.  Discussed limitations of medical interventions to prolong quality of life in some situations and discussed the concept of human mortality.  He states he believes he is interested in being optimized and and then to focus on comfort on D/C.    Plans for family meeting tomorrow at 1:00.     SUMMARY OF RECOMMENDATIONS   Patient leaning toward medical optimization and D/C with hospice. Family meeting at 1:00 tomorrow.      Primary  Diagnoses: Present on Admission: . Atrial flutter ([place])   I have reviewed the medical record, interviewed the patient and family, and examined the patient. The following aspects are pertinent.  Past Medical History:  Diagnosis Date  . Hypertension   . Migraines    Social History   Socioeconomic History  . Marital status: Single    Spouse name: Not on file  . Number of children: Not on file  . Years of education: Not on file  . Highest education level: Not on file  Occupational History  . Not on file  Tobacco Use  . Smoking status: Current Every Day Smoker    Packs/day: 0.50    Types: Cigarettes  . Smokeless tobacco: Never Used  Substance and Sexual Activity  . Alcohol use: Yes    Alcohol/week: 2.0 - 3.0 standard drinks    Types: 2 - 3 Cans of beer per week  . Drug use: Yes    Frequency: 1.0 times per week    Types: Cocaine  . Sexual activity: Not on file  Other Topics Concern  . Not on file  Social History Narrative  . Not on file   Social Determinants of Health   Financial Resource Strain:   .  Difficulty of Paying Living Expenses:   Food Insecurity:   . Worried About Programme researcher, broadcasting/film/video in the Last Year:   . Barista in the Last Year:   Transportation Needs:   . Freight forwarder (Medical):   Marland Kitchen Lack of Transportation (Non-Medical):   Physical Activity:   . Days of Exercise per Week:   . Minutes of Exercise per Session:   Stress:   . Feeling of Stress :   Social Connections:   . Frequency of Communication with Friends and Family:   . Frequency of Social Gatherings with Friends and Family:   . Attends Religious Services:   . Active Member of Clubs or Organizations:   . Attends Banker Meetings:   Marland Kitchen Marital Status:    History reviewed. No pertinent family history. Scheduled Meds: . chlorhexidine  15 mL Mouth Rinse BID  . Chlorhexidine Gluconate Cloth  6 each Topical Daily  . docusate  100 mg Per Tube BID  . folic acid  1 mg  Intravenous Daily  . furosemide  40 mg Intravenous Daily  . mouth rinse  15 mL Mouth Rinse q12n4p  . methylPREDNISolone (SOLU-MEDROL) injection  40 mg Intravenous Q12H  . multivitamin with minerals  1 tablet Per Tube Daily  . sennosides  5 mL Per Tube QHS  . sodium chloride flush  10-40 mL Intracatheter Q12H  . thiamine injection  100 mg Intravenous Daily   Continuous Infusions: . argatroban 0.5 mcg/kg/min (02/21/20 1130)   PRN Meds:.acetaminophen **OR** acetaminophen, bisacodyl, labetalol, midazolam, sodium chloride flush Medications Prior to Admission:  Prior to Admission medications   Medication Sig Start Date End Date Taking? Authorizing Provider  albuterol (PROVENTIL HFA;VENTOLIN HFA) 108 (90 Base) MCG/ACT inhaler Inhale 2 puffs into the lungs every 6 (six) hours as needed for wheezing or shortness of breath. Patient not taking: Reported on 02/13/2020 11/23/17   Phineas Semen, MD  traMADol (ULTRAM) 50 MG tablet Take 1 tablet (50 mg total) by mouth every 6 (six) hours as needed. Patient not taking: Reported on 02/13/2020 04/06/18   Faythe Ghee, PA-C   Allergies  Allergen Reactions  . Heparin     Suspected HIT: labs pending   Review of Systems  All other systems reviewed and are negative.   Physical Exam Pulmonary:     Effort: Pulmonary effort is normal.  Skin:    General: Skin is warm and dry.  Neurological:     Mental Status: He is alert.     Vital Signs: BP (!) 171/87   Pulse 78   Temp 99.9 F (37.7 C) (Rectal)   Resp 15   Ht 5\' 11"  (1.803 m)   Wt 99.9 kg   SpO2 100%   BMI 30.72 kg/m  Pain Scale: CPOT POSS *See Group Information*: 1-Acceptable,Awake and alert Pain Score: 0-No pain   SpO2: SpO2: 100 % O2 Device:SpO2: 100 % O2 Flow Rate: .O2 Flow Rate (L/min): 15 L/min  IO: Intake/output summary:   Intake/Output Summary (Last 24 hours) at 02/21/2020 1523 Last data filed at 02/21/2020 1130 Gross per 24 hour  Intake 1476.69 ml  Output 2475 ml  Net  -998.31 ml    LBM: Last BM Date: 02/21/20 Baseline Weight: Weight: 90.7 kg Most recent weight: Weight: 99.9 kg     Palliative Assessment/Data:     Time In: 2:50 Time Out:3:20 Time Total: 30 min Greater than 50%  of this time was spent counseling and coordinating care related to  the above assessment and plan.  Signed by: Morton Stall, NP   Please contact Palliative Medicine Team phone at [phone] for questions and concerns.  For individual provider: See Loretha Stapler

## 2020-02-22 ENCOUNTER — Inpatient Hospital Stay: Payer: Medicaid Other

## 2020-02-22 DIAGNOSIS — Z515 Encounter for palliative care: Secondary | ICD-10-CM

## 2020-02-22 DIAGNOSIS — Z7189 Other specified counseling: Secondary | ICD-10-CM

## 2020-02-22 LAB — OCCULT BLOOD X 1 CARD TO LAB, STOOL: Fecal Occult Bld: POSITIVE — AB

## 2020-02-22 LAB — COMPREHENSIVE METABOLIC PANEL
ALT: 251 U/L — ABNORMAL HIGH (ref 0–44)
AST: 101 U/L — ABNORMAL HIGH (ref 15–41)
Albumin: 2.8 g/dL — ABNORMAL LOW (ref 3.5–5.0)
Alkaline Phosphatase: 91 U/L (ref 38–126)
Anion gap: 13 (ref 5–15)
BUN: 64 mg/dL — ABNORMAL HIGH (ref 8–23)
CO2: 30 mmol/L (ref 22–32)
Calcium: 9.1 mg/dL (ref 8.9–10.3)
Chloride: 106 mmol/L (ref 98–111)
Creatinine, Ser: 1.87 mg/dL — ABNORMAL HIGH (ref 0.61–1.24)
GFR calc Af Amer: 43 mL/min — ABNORMAL LOW (ref >60)
GFR calc non Af Amer: 37 mL/min — ABNORMAL LOW (ref >60)
Glucose, Bld: 116 mg/dL — ABNORMAL HIGH (ref 70–99)
Potassium: 3.5 mmol/L (ref 3.5–5.1)
Sodium: 149 mmol/L — ABNORMAL HIGH (ref 135–145)
Total Bilirubin: 4.4 mg/dL — ABNORMAL HIGH (ref 0.3–1.2)
Total Protein: 6.5 g/dL (ref 6.5–8.1)

## 2020-02-22 LAB — CBC WITH DIFFERENTIAL/PLATELET
Abs Immature Granulocytes: 0.1 10*3/uL — ABNORMAL HIGH (ref 0.00–0.07)
Basophils Absolute: 0 10*3/uL (ref 0.0–0.1)
Basophils Relative: 0 %
Eosinophils Absolute: 0 10*3/uL (ref 0.0–0.5)
Eosinophils Relative: 0 %
HCT: 44.3 % (ref 39.0–52.0)
Hemoglobin: 13.9 g/dL (ref 13.0–17.0)
Immature Granulocytes: 1 %
Lymphocytes Relative: 5 %
Lymphs Abs: 0.6 10*3/uL — ABNORMAL LOW (ref 0.7–4.0)
MCH: 29.6 pg (ref 26.0–34.0)
MCHC: 31.4 g/dL (ref 30.0–36.0)
MCV: 94.5 fL (ref 80.0–100.0)
Monocytes Absolute: 0.8 10*3/uL (ref 0.1–1.0)
Monocytes Relative: 7 %
Neutro Abs: 10.1 10*3/uL — ABNORMAL HIGH (ref 1.7–7.7)
Neutrophils Relative %: 87 %
Platelets: 58 10*3/uL — ABNORMAL LOW (ref 150–400)
RBC: 4.69 MIL/uL (ref 4.22–5.81)
RDW: 15.2 % (ref 11.5–15.5)
WBC: 11.6 10*3/uL — ABNORMAL HIGH (ref 4.0–10.5)
nRBC: 0.2 % (ref 0.0–0.2)

## 2020-02-22 LAB — GLUCOSE, CAPILLARY
Glucose-Capillary: 99 mg/dL (ref 70–99)
Glucose-Capillary: 98 mg/dL (ref 70–99)
Glucose-Capillary: 120 mg/dL — ABNORMAL HIGH (ref 70–99)

## 2020-02-22 LAB — APTT
aPTT: 27 seconds (ref 24–36)
aPTT: 47 seconds — ABNORMAL HIGH (ref 24–36)

## 2020-02-22 LAB — PHOSPHORUS: Phosphorus: 4.5 mg/dL (ref 2.5–4.6)

## 2020-02-22 LAB — TROPONIN I (HIGH SENSITIVITY): Troponin I (High Sensitivity): 120 ng/L (ref <18)

## 2020-02-22 LAB — MAGNESIUM: Magnesium: 2 mg/dL (ref 1.7–2.4)

## 2020-02-22 MED ORDER — PREDNISONE 10 MG PO TABS
10.0000 mg | ORAL_TABLET | Freq: Every day | ORAL | Status: AC
  Administered 2020-02-26 – 2020-02-27 (×2): 10 mg via ORAL
  Filled 2020-02-22 (×2): qty 1

## 2020-02-22 MED ORDER — PREDNISONE 10 MG PO TABS: 20.0000 mg | ORAL_TABLET | Freq: Every day | ORAL | Status: DC

## 2020-02-22 MED ORDER — LISINOPRIL 2.5 MG PO TABS
2.5000 mg | ORAL_TABLET | Freq: Every day | ORAL | Status: DC
  Administered 2020-02-22 – 2020-03-07 (×14): 2.5 mg via ORAL
  Filled 2020-02-22 (×15): qty 1

## 2020-02-22 MED ORDER — METOPROLOL TARTRATE 25 MG PO TABS
12.5000 mg | ORAL_TABLET | Freq: Two times a day (BID) | ORAL | Status: DC
  Administered 2020-02-22 – 2020-03-07 (×28): 12.5 mg via ORAL
  Filled 2020-02-22 (×29): qty 1

## 2020-02-22 MED ORDER — ISOSORB DINITRATE-HYDRALAZINE 20-37.5 MG PO TABS
1.0000 | ORAL_TABLET | Freq: Three times a day (TID) | ORAL | Status: DC
  Administered 2020-02-22 – 2020-03-07 (×42): 1 via ORAL
  Filled 2020-02-22 (×47): qty 1

## 2020-02-22 MED ORDER — PREDNISONE 20 MG PO TABS
40.0000 mg | ORAL_TABLET | Freq: Every day | ORAL | Status: AC
  Administered 2020-02-23: 10:00:00 40 mg via ORAL
  Filled 2020-02-22: qty 2

## 2020-02-22 MED ORDER — ARGATROBAN 50 MG/50ML IV SOLN
0.6000 ug/kg/min | INTRAVENOUS | Status: DC
  Administered 2020-02-22: 0.5 ug/kg/min via INTRAVENOUS
  Administered 2020-02-23 (×2): 0.6 ug/kg/min via INTRAVENOUS
  Filled 2020-02-22 (×3): qty 50

## 2020-02-22 MED ORDER — NITROGLYCERIN 0.4 MG SL SUBL
0.4000 mg | SUBLINGUAL_TABLET | SUBLINGUAL | Status: DC | PRN
  Administered 2020-02-27 – 2020-03-02 (×3): 0.4 mg via SUBLINGUAL
  Filled 2020-02-22 (×4): qty 1

## 2020-02-22 MED ORDER — ALBUMIN HUMAN 25 % IV SOLN
12.5000 g | Freq: Every day | INTRAVENOUS | Status: DC
  Administered 2020-02-22 – 2020-02-23 (×2): 12.5 g via INTRAVENOUS
  Filled 2020-02-22 (×2): qty 50

## 2020-02-22 MED ORDER — PANTOPRAZOLE SODIUM 40 MG PO TBEC
40.0000 mg | DELAYED_RELEASE_TABLET | Freq: Every day | ORAL | Status: DC
  Administered 2020-02-22 – 2020-03-07 (×14): 40 mg via ORAL
  Filled 2020-02-22 (×15): qty 1

## 2020-02-22 MED ORDER — LOPERAMIDE HCL 1 MG/7.5ML PO SUSP
2.0000 mg | ORAL | Status: DC | PRN
  Filled 2020-02-22 (×4): qty 15

## 2020-02-22 MED ORDER — PREDNISONE 20 MG PO TABS
30.0000 mg | ORAL_TABLET | Freq: Every day | ORAL | Status: AC
  Administered 2020-02-24: 30 mg via ORAL
  Filled 2020-02-22: qty 1

## 2020-02-22 MED ORDER — MORPHINE SULFATE (PF) 2 MG/ML IV SOLN
1.0000 mg | INTRAVENOUS | Status: DC | PRN
  Administered 2020-02-22 (×2): 1 mg via INTRAVENOUS
  Filled 2020-02-22 (×2): qty 1

## 2020-02-22 MED ORDER — THIAMINE HCL 100 MG PO TABS
100.0000 mg | ORAL_TABLET | Freq: Every day | ORAL | Status: DC
  Administered 2020-02-22 – 2020-03-07 (×14): 100 mg
  Filled 2020-02-22 (×17): qty 1

## 2020-02-22 MED ORDER — PREDNISONE 20 MG PO TABS
20.0000 mg | ORAL_TABLET | Freq: Every day | ORAL | Status: AC
  Filled 2020-02-22: qty 1

## 2020-02-22 NOTE — Consult Note (Signed)
ANTICOAGULATION CONSULT NOTE  Pharmacy Consult for argatroban infusion Indication: DVT  Allergies  Allergen Reactions  . Heparin     Suspected HIT: labs pending    Patient Measurements: Height: 5\' 11"  (180.3 cm) Weight: 205 lb 4 oz (93.1 kg) IBW/kg (Calculated) : 75.3 Heparin Dosing Weight: 90.7 kg  Vital Signs: Temp: 98 F (36.7 C) (03/31 2000) Temp Source: Oral (03/31 2000) BP: 192/90 (03/31 2130) Pulse Rate: 89 (03/31 2130)  Labs: Recent Labs    02/20/20 0324 02/20/20 1454 02/21/20 0416 02/22/20 0234 02/22/20 0407 02/22/20 2102  HGB 13.9  --  14.4  --  13.9  --   HCT 41.9  --  44.6  --  44.3  --   PLT 77*  --  69*  --  58*  --   APTT  --    < > 60*  --  27 47*  HEPARINUNFRC 0.56  --   --   --   --   --   CREATININE 2.44*  --  2.26*  --  1.87*  --   TROPONINIHS  --   --   --  120*  --   --    < > = values in this interval not displayed.    Estimated Creatinine Clearance: 47.1 mL/min (A) (by C-G formula based on SCr of 1.87 mg/dL (H)).   Medical History: Past Medical History:  Diagnosis Date  . Hypertension   . Migraines     Medications:  No anticoagulation prior to admission per chart  Assessment: Patient is a 64 y/o M with a history of hypertension, cocaine, tobacco, and alcohol use who presented to [facility] ED with new onset CHF and atrial flutter and subsequently went into cardiac arrest. Patient is intubated and mechanically ventilated for respiratory failure. Found to have multiple lower extremity DVTs on doppler. Initially started on therapeutic enoxaparin which was discontinued secondary to worsening renal function. Pharmacy was then consulted to initiate a heparin infusion for treatment of VTE. 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 Subsequently discontinued on 3/30 for concern for bleeding. Pharmacy reconsulted 3/31 for restart of  argatroban. Per MD, patient improving and less concern for bleeding.  HIT antibody negative, SRA pending.  Goal of Therapy:  aPTT range 50 - 90 seconds Monitor platelets by anticoagulation protocol: Yes   Plan:  Restart argatroban at 0.5 mcg/kg/min. Will check aPTT 2 hours after start of infusion. Will follow aPTT every 2 hours until therapeutic x 2 then daily thereafter. Continue to follow SRA results. CBC daily.  03/31: APTT @ 2102 = 47 Will increase argatroban infusion to 0.6 mcg/kg/min and recheck aPTT 2 hrs after rate change.   Winston Misner D Clinical Pharmacist 10:20 PM

## 2020-02-22 NOTE — Progress Notes (Signed)
Patient ID: Ryota Treece, male   DOB: [birth year], 64 y.o.   MRN: [id number] 64 yo male admitted s/p respiratory arrest followed by cardiac arrest, cardiogenic shock, pulmonary edema, severe metabolic acidosis, BLE DVT'sconcerning for possible pulmonary embolismsrequiring mechanical intubation and levophed gtt. he was admitted on 3/22 with acute hypoxic respiratory failure secondary to metabolic acidosis and pulmonary edema.  Found with a flutter RVR.  While bed availability in stepdown unit was pending patient had respiratory arrest which led to cardiac arrest and he was mechanically intubated and admitted to the ICU.  An echocardiogram revealed severe systolic dysfunction.  He is status post extubation on BiPAP.  He had multiorgan failure.  Echo with EF less than 20%.  With bilateral occlusive DVT of lower extremity.  He has significant thrombocytopenia post heparin infusion.  Patient was switched to argatroban with pharmacy consult.  His BNP is trending down.  Tolerating Lasix.  He will be transferred to the hospitalist service on 02/23/2020

## 2020-02-22 NOTE — Consult Note (Signed)
ANTICOAGULATION CONSULT NOTE  Pharmacy Consult for argatroban infusion Indication: DVT  Allergies  Allergen Reactions  . Heparin     Suspected HIT: labs pending    Patient Measurements: Height: 5\' 11"  (180.3 cm) Weight: 205 lb 4 oz (93.1 kg) IBW/kg (Calculated) : 75.3 Heparin Dosing Weight: 90.7 kg  Vital Signs: Temp: 97.6 F (36.4 C) (03/31 1600) Temp Source: Axillary (03/31 1600) BP: 128/79 (03/31 1600) Pulse Rate: 84 (03/31 1600)  Labs: Recent Labs    02/20/20 0324 02/20/20 0324 02/20/20 1454 02/20/20 1701 02/21/20 0416 02/22/20 0234 02/22/20 0407  HGB 13.9   < >  --   --  14.4  --  13.9  HCT 41.9  --   --   --  44.6  --  44.3  PLT 77*  --   --   --  69*  --  58*  APTT  --   --    < > 64* 60*  --  27  HEPARINUNFRC 0.56  --   --   --   --   --   --   CREATININE 2.44*  --   --   --  2.26*  --  1.87*  TROPONINIHS  --   --   --   --   --  120*  --    < > = values in this interval not displayed.    Estimated Creatinine Clearance: 47.1 mL/min (A) (by C-G formula based on SCr of 1.87 mg/dL (H)).   Medical History: Past Medical History:  Diagnosis Date  . Hypertension   . Migraines     Medications:  No anticoagulation prior to admission per chart  Assessment: Patient is a 64 y/o M with a history of hypertension, cocaine, tobacco, and alcohol use who presented to [facility] ED with new onset CHF and atrial flutter and subsequently went into cardiac arrest. Patient is intubated and mechanically ventilated for respiratory failure. Found to have multiple lower extremity DVTs on doppler. Initially started on therapeutic enoxaparin which was discontinued secondary to worsening renal function. Pharmacy was then consulted to initiate a heparin infusion for treatment of VTE. 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 Subsequently discontinued on 3/30 for concern for  bleeding. Pharmacy reconsulted 3/31 for restart of argatroban. Per MD, patient improving and less concern for bleeding.  HIT antibody negative, SRA pending.  Goal of Therapy:  aPTT range 50 - 90 seconds Monitor platelets by anticoagulation protocol: Yes   Plan:  Restart argatroban at 0.5 mcg/kg/min. Will check aPTT 2 hours after start of infusion. Will follow aPTT every 2 hours until therapeutic x 2 then daily thereafter. Continue to follow SRA results. CBC daily.  4/31, PharmD Clinical Pharmacist 5:07 PM

## 2020-02-22 NOTE — Evaluation (Signed)
Physical Therapy Evaluation Patient Details Name: Shawn Taylor MRN: [id number] DOB: [birth year] Today's Date: 02/22/2020   History of Present Illness  Pt is a 64 yo male presented to ED in atrial flutter 02/13/20 s/p respiratory arrest followed by cardiac arrest, cardiogenic shock, pulmonary edema, severe metabolic acidosis, BLE DVT's concerning for possible pulmonary embolisms requiring mechanical intubation and levophed. Now extubated 02/21/20. PMH of HTN, migraines.    Clinical Impression  Pt alert, oriented to self and place, disoriented to time and situation and exhibited decreased short term memory, asking PT the same question several times. Pt stated he "lost everything" including his home, and at discharge plans on asking his ex girlfriend if he can stay with her. Previously independent at baseline per pt.  The patient needed physical assist for supine LE exercises, stated the exercises felt nice to move his legs. Supine <> sit with maxAx2. The patient was able to sit EOB with feet support and bilateral UE propped. Several instances of supervision, majority of sitting with CGA-minA. With fatigue the patient exhibited increased L lateral lean, further mobility deferred due to fatigue and flexi-seal concerns (RN notified of BM on linens).  Overall the patient demonstrated deficits (see "PT Problem List") that impede the patient's functional abilities, safety, and mobility and would benefit from skilled PT intervention. Recommendation is SNF due to acute decline in functional status and level of assistance needed.     Follow Up Recommendations SNF    Equipment Recommendations  Other (comment)(TBD)    Recommendations for Other Services       Precautions / Restrictions Precautions Precautions: Fall Restrictions Weight Bearing Restrictions: No      Mobility  Bed Mobility Overal bed mobility: Needs Assistance Bed Mobility: Supine to Sit;Sit to Supine     Supine to sit: Max  assist;+2 for safety/equipment Sit to supine: Max assist;+2 for safety/equipment      Transfers                 General transfer comment: deferred due to weakness, and flexiseal potentially dislodging  Ambulation/Gait                Stairs            Wheelchair Mobility    Modified Rankin (Stroke Patients Only)       Balance Overall balance assessment: Needs assistance Sitting-balance support: Feet supported Sitting balance-Leahy Scale: Poor Sitting balance - Comments: several instances of supervision, majority of  time CGA-minA                                     Pertinent Vitals/Pain Pain Assessment: No/denies pain    Home Living                   Additional Comments: Pt plans on asking  his ex girlfriend if he can stay with her.    Prior Function Level of Independence: Independent               Hand Dominance        Extremity/Trunk Assessment   Upper Extremity Assessment Upper Extremity Assessment: Generalized weakness    Lower Extremity Assessment Lower Extremity Assessment: Generalized weakness    Cervical / Trunk Assessment Cervical / Trunk Assessment: Kyphotic  Communication      Cognition Arousal/Alertness: Awake/alert Behavior During Therapy: Flat affect  General Comments: Pt oriented to self, place, disoriented to situation, time. Decreased short term memory as well (asked PT several times repeating questions). Able to follow commands      General Comments      Exercises General Exercises - Lower Extremity Long Arc Quad: AROM;Strengthening;Both;10 reps Heel Slides: AAROM;Strengthening;10 reps;Both Hip ABduction/ADduction: AAROM;Strengthening;10 reps;Both Other Exercises Other Exercises: Pt sat EOB for 5 minutes, displayed decreased balance with fatigue   Assessment/Plan    PT Assessment Patient needs continued PT services  PT Problem  List Decreased strength;Decreased mobility;Decreased range of motion;Decreased knowledge of precautions;Decreased activity tolerance;Decreased balance;Pain;Decreased knowledge of use of DME       PT Treatment Interventions DME instruction;Therapeutic activities;Gait training;Therapeutic exercise;Patient/family education;Stair training;Balance training;Functional mobility training;Neuromuscular re-education;Wheelchair mobility training    PT Goals (Current goals can be found in the Care Plan section)  Acute Rehab PT Goals Patient Stated Goal: to get better PT Goal Formulation: With patient Time For Goal Achievement: 03/07/20 Potential to Achieve Goals: Good    Frequency Min 2X/week   Barriers to discharge Decreased caregiver support;Inaccessible home environment (pt reported that he does not have a home)    Co-evaluation               AM-PAC PT "6 Clicks" Mobility  Outcome Measure Help needed turning from your back to your side while in a flat bed without using bedrails?: Total Help needed moving from lying on your back to sitting on the side of a flat bed without using bedrails?: Total Help needed moving to and from a bed to a chair (including a wheelchair)?: Total Help needed standing up from a chair using your arms (e.g., wheelchair or bedside chair)?: Total Help needed to walk in hospital room?: Total Help needed climbing 3-5 steps with a railing? : Total 6 Click Score: 6    End of Session   Activity Tolerance: Patient limited by fatigue Patient left: in bed;with call bell/phone within reach;with bed alarm set;Other (comment)(chaplain in room) Nurse Communication: Mobility status PT Visit Diagnosis: Other abnormalities of gait and mobility (R26.89);Unsteadiness on feet (R26.81);Muscle weakness (generalized) (M62.81)    Time: 6945-0388 PT Time Calculation (min) (ACUTE ONLY): 43 min   Charges:   PT Evaluation $PT Eval Moderate Complexity: 1 Mod PT  Treatments $Therapeutic Exercise: 23-37 mins        Olga Coaster PT, DPT 3:00 PM,02/22/20

## 2020-02-22 NOTE — Progress Notes (Signed)
CRITICAL CARE PROGRESS NOTE    Name: Shawn Taylor MRN: [id number] DOB: Apr 20, 1956     LOS: 33   SUBJECTIVE FINDINGS & SIGNIFICANT EVENTS   Patient description:  64 yo male admitted s/p respiratory arrest followed by cardiac arrest, cardiogenic shock, pulmonary edema, severe metabolic acidosis, BLE DVT'sconcerning for possible pulmonary embolismsrequiring mechanical intubation and levophed gtt  Lines / Drains: PIVx2  Cultures / Sepsis markers: Blood cultures  Antibiotics: Unasyn   Protocols / Consultants: Cardio, PCCM   Events:  03/22: Pt admitted to the stepdown unit due to atrial flutter with rvr, acute hypoxic respiratory failure secondary to severe metabolic acidosis and pulmonary edema 03/22: Venous US Bilateral Extremity revealed occlusive thrombus within the right popliteal vein, posterior tibial vein, and peroneal veins. Occlusive thrombus within the left popliteal vein and left anterior peroneal vein. 3/22: While awaiting bed availability in the stepdown unit pt respiratory arrested which led to cardiac arrest ROSC obtained, pt mechanically intubation, and admitted to ICU 3/49mltiorgan failure, family at bedside updatedEF<20%. The left  ventricle has severely decreased function. The left ventricle demonstrates  global hypokinesis.  3/24remains critically ill, multiorgan failure 3/25remains critically ill, multiorgan failure, seems to be following some simple commands, mostly delirious, son at bedside updated 3/271fled weaning trials , multiorgan failure  3/27: failed weaning attempt 3/30- extubated to BIPAP 3/31- patient clinically improved now off of BIPAP   PAST MEDICAL HISTORY   Past Medical History:  Diagnosis Date  . Hypertension   . Migraines      SURGICAL HISTORY     History reviewed. No pertinent surgical history.   FAMILY HISTORY   History reviewed. No pertinent family history.   SOCIAL HISTORY   Social History   Tobacco Use  . Smoking status: Current Every Day Smoker    Packs/day: 0.50    Types: Cigarettes  . Smokeless tobacco: Never Used  Substance Use Topics  . Alcohol use: Yes    Alcohol/week: 2.0 - 3.0 standard drinks    Types: 2 - 3 Cans of beer per week  . Drug use: Yes    Frequency: 1.0 times per week    Types: Cocaine     MEDICATIONS   Current Medication:  Current Facility-Administered Medications:  .  acetaminophen (TYLENOL) tablet 650 mg, 650 mg, Oral, Q6H PRN, 650 mg at 02/22/20 1619 **OR** acetaminophen (TYLENOL) suppository 650 mg, 650 mg, Rectal, Q6H PRN, AmKurtis BushmanSahar, MD .  albumin human 25 % solution 12.5 g, 12.5 g, Intravenous, Daily, AlOttie GlazierMD, Stopped at 02/22/20 1249 .  chlorhexidine (PERIDEX) 0.12 % solution 15 mL, 15 mL, Mouth Rinse, BID, Dorinda Stehr, MD, 15 mL at 02/22/20 1016 .  Chlorhexidine Gluconate Cloth 2 % PADS 6 each, 6 each, Topical, Daily, BlAwilda BillNP, 6 each at 02/22/20 1017 .  folic acid injection 1 mg, 1 mg, Intravenous, Daily, BlAwilda BillNP, 1 mg at 02/22/20 1122 .  furosemide (LASIX) injection 40 mg, 40 mg, Intravenous, Daily, AlLanney GinsFuad, MD, 40 mg at 02/22/20 1017 .  isosorbide-hydrALAZINE (BIDIL) 20-37.5 MG per tablet 1 tablet, 1 tablet, Oral, TID, AlOttie GlazierMD, 1 tablet at 02/22/20 1619 .  lisinopril (ZESTRIL) tablet 2.5 mg, 2.5 mg, Oral, Daily, Darrin Koman, MD, 2.5 mg at 02/22/20 1532 .  loperamide HCl (IMODIUM) 1 MG/7.5ML suspension 2 mg, 2 mg, Oral, PRN, AlLanney GinsFuad, MD .  MEDLINE mouth rinse, 15 mL, Mouth Rinse, q12n4p, Preslynn Bier, MD, 15 mL at 02/22/20 1534 .  metoprolol tartrate (  LOPRESSOR) tablet 12.5 mg, 12.5 mg, Oral, BID, Kendrick Remigio, MD, 12.5 mg at 02/22/20 1205 .  midazolam (VERSED) injection 2 mg, 2 mg, Intravenous,  Q2H PRN, Darel Hong D, NP, 2 mg at 02/20/20 0630 .  morphine 2 MG/ML injection 1-2 mg, 1-2 mg, Intravenous, Q4H PRN, Darel Hong D, NP, 1 mg at 02/22/20 0757 .  multivitamin with minerals tablet 1 tablet, 1 tablet, Per Tube, Daily, Benita Gutter, RPH, 1 tablet at 02/22/20 1016 .  nitroGLYCERIN (NITROSTAT) SL tablet 0.4 mg, 0.4 mg, Sublingual, Q5 min PRN, Darel Hong D, NP .  pantoprazole (PROTONIX) EC tablet 40 mg, 40 mg, Oral, Daily, Ottie Glazier, MD, 40 mg at 02/22/20 1205 .  [START ON 02/23/2020] predniSONE (DELTASONE) tablet 40 mg, 40 mg, Oral, Q breakfast **FOLLOWED BY** [START ON 02/24/2020] predniSONE (DELTASONE) tablet 30 mg, 30 mg, Oral, Q breakfast **FOLLOWED BY** [START ON 02/25/2020] predniSONE (DELTASONE) tablet 20 mg, 20 mg, Oral, Q breakfast **FOLLOWED BY** [START ON 02/26/2020] predniSONE (DELTASONE) tablet 10 mg, 10 mg, Oral, Q breakfast, Remy Dia, MD .  sodium chloride flush (NS) 0.9 % injection 10-40 mL, 10-40 mL, Intracatheter, Q12H, Blakeney, Dana G, NP, 10 mL at 02/22/20 1017 .  sodium chloride flush (NS) 0.9 % injection 10-40 mL, 10-40 mL, Intracatheter, PRN, Awilda Bill, NP .  thiamine tablet 100 mg, 100 mg, Per Tube, Daily, Dallie Piles, RPH, 100 mg at 02/22/20 1017    ALLERGIES   Heparin    REVIEW OF SYSTEMS     10 point ROS unable to obtain due to sedation while critically ill  PHYSICAL EXAMINATION   Vital Signs: Temp:  [97.3 F (36.3 C)-98.1 F (36.7 C)] 97.6 F (36.4 C) (03/31 1129) Pulse Rate:  [81-97] 84 (03/31 1600) Resp:  [13-22] 14 (03/31 1600) BP: (117-169)/(48-124) 128/79 (03/31 1600) SpO2:  [93 %-100 %] 96 % (03/31 1600) Weight:  [93.1 kg] 93.1 kg (03/31 0359)  GENERAL:NAD on MV HEAD: Normocephalic, atraumatic.  EYES: Pupils equal, round, reactive to light.  No scleral icterus.  MOUTH: Moist mucosal membrane. NECK: Supple. No thyromegaly. No nodules. No JVD.  PULMONARY: mild rhonchi bl CARDIOVASCULAR: S1 and S2.  Regular rate and rhythm. No murmurs, rubs, or gallops.  GASTROINTESTINAL: Soft, nontender, non-distended. No masses. Positive bowel sounds. No hepatosplenomegaly.  MUSCULOSKELETAL: No swelling, clubbing, or edema.  NEUROLOGIC: Mild distress due to acute illness, GCS4T SKIN:intact,warm,dry   PERTINENT DATA     Infusions: . albumin human Stopped (02/22/20 1249)   Scheduled Medications: . chlorhexidine  15 mL Mouth Rinse BID  . Chlorhexidine Gluconate Cloth  6 each Topical Daily  . folic acid  1 mg Intravenous Daily  . furosemide  40 mg Intravenous Daily  . isosorbide-hydrALAZINE  1 tablet Oral TID  . lisinopril  2.5 mg Oral Daily  . mouth rinse  15 mL Mouth Rinse q12n4p  . metoprolol tartrate  12.5 mg Oral BID  . multivitamin with minerals  1 tablet Per Tube Daily  . pantoprazole  40 mg Oral Daily  . [START ON 02/23/2020] predniSONE  40 mg Oral Q breakfast   Followed by  . [START ON 02/24/2020] predniSONE  30 mg Oral Q breakfast   Followed by  . [START ON 02/25/2020] predniSONE  20 mg Oral Q breakfast   Followed by  . [START ON 02/26/2020] predniSONE  10 mg Oral Q breakfast  . sodium chloride flush  10-40 mL Intracatheter Q12H  . thiamine  100 mg Per Tube Daily  PRN Medications: acetaminophen **OR** acetaminophen, loperamide HCl, midazolam, morphine injection, nitroGLYCERIN, sodium chloride flush Hemodynamic parameters:   Intake/Output: 03/30 0701 - 03/31 0700 In: 161.6 [I.V.:161.6] Out: 4665 [Urine:3475]  Ventilator  Settings:      LAB RESULTS:  Basic Metabolic Panel: Recent Labs  Lab 02/18/20 0445 02/18/20 0445 02/19/20 0430 02/19/20 0430 02/20/20 0324 02/20/20 0324 02/21/20 0416 02/22/20 0407  NA 134*  --  138  --  141  --  144 149*  K 3.9   < > 3.7   < > 3.7   < > 4.0 3.5  CL 89*  --  96*  --  98  --  102 106  CO2 34*  --  32  --  30  --  29 30  GLUCOSE 161*  --  140*  --  164*  --  182* 116*  BUN 78*  --  68*  --  64*  --  61* 64*  CREATININE 3.67*  --   2.79*  --  2.44*  --  2.26* 1.87*  CALCIUM 8.3*  --  8.3*  --  8.4*  --  8.7* 9.1  MG 1.9  --  1.8  --  1.8  --  1.9 2.0  PHOS 5.4*  --  4.8*  --  5.7*  --  5.3* 4.5   < > = values in this interval not displayed.   Liver Function Tests: Recent Labs  Lab 02/18/20 0445 02/19/20 0430 02/20/20 0324 02/21/20 0416 02/22/20 0407  AST 530* 274* 139* 85* 101*  ALT 802* 613* 420* 307* 251*  ALKPHOS 107 101 92 92 91  BILITOT 3.2* 3.2* 3.3* 3.3* 4.4*  PROT 5.9* 5.8* 5.9* 6.0* 6.5  ALBUMIN 2.4* 2.3* 2.3* 2.5* 2.8*   No results for input(s): LIPASE, AMYLASE in the last 168 hours. No results for input(s): AMMONIA in the last 168 hours. CBC: Recent Labs  Lab 02/18/20 0445 02/19/20 0430 02/20/20 0324 02/21/20 0416 02/22/20 0407  WBC 9.3 8.7 8.3 9.5 11.6*  NEUTROABS 8.0* 7.3 7.0 8.2* 10.1*  HGB 13.5 13.8 13.9 14.4 13.9  HCT 40.0 41.4 41.9 44.6 44.3  MCV 88.1 90.6 90.9 92.5 94.5  PLT 99* 88* 77* 69* 58*   Cardiac Enzymes: No results for input(s): CKTOTAL, CKMB, CKMBINDEX, TROPONINI in the last 168 hours. BNP: Invalid input(s): POCBNP CBG: Recent Labs  Lab 02/21/20 1929 02/21/20 2332 02/22/20 0354 02/22/20 0718 02/22/20 1145  GLUCAP 121* 113* 99 98 120*     IMAGING RESULTS:     ASSESSMENT AND PLAN    -Multidisciplinary rounds held today  Acute Hypoxic Respiratory Failure -possibly due to flash pulmonary edema vs PE  -continue Bronchodilator Therapy -Wean Fio2 and PEEP as tolerated -will perform SAT/SBT when respiratory parameters are met -discussed with Niece Lonia Chimera and POA Marcus     Acute on chronic systolic CHF with LD<35% -background of accelerated Atrial fultter  -cardiology on case - appreciate input  - BNP >900 trending to 500 -oxygen as needed -Lasix as tolerated -follow up cardiac enzymes as indicated ICU telemetry  monitoring    Bilateral occlusive DVT of lower extermities  - significant thrombocytopenia post Heparin infusion -PF4  and Serotonin release in process  - patient on argatroban with pharmacy consult    GI/Nutrition GI PROPHYLAXIS as indicated DIET-->TF's as tolerated Constipation protocol as indicated  ENDO - ICU hypoglycemic\Hyperglycemia protocol -check FSBS per protocol   ELECTROLYTES -follow labs as needed -replace as needed -pharmacy consultation   DVT/GI  PRX ordered -SCDs  TRANSFUSIONS AS NEEDED MONITOR FSBS ASSESS the need for LABS as needed   Critical care provider statement:    Critical care time (minutes):  33   Critical care time was exclusive of:  Separately billable procedures and treating other patients   Critical care was necessary to treat or prevent imminent or life-threatening deterioration of the following conditions:  acute hypoxemic resp failure, CHF, AFL, AKI, transaminitis, multiple comorbid conditions   Critical care was time spent personally by me on the following activities:  Development of treatment plan with patient or surrogate, discussions with consultants, evaluation of patient's response to treatment, examination of patient, obtaining history from patient or surrogate, ordering and performing treatments and interventions, ordering and review of laboratory studies and re-evaluation of patient's condition.  I assumed direction of critical care for this patient from another provider in my specialty: no    This document was prepared using Dragon voice recognition software and may include unintentional dictation errors.    Ottie Glazier, M.D.  Division of [place]

## 2020-02-22 NOTE — Progress Notes (Signed)
Patient complained of chest pain. ICU NP notified. Received new orders for EKG and troponin labwork. Patient in NSR on monitor. Vital signs stable. Gave PRN morphine per ICU NP request. Will continue to monitor.

## 2020-02-22 NOTE — Progress Notes (Signed)
A&OX4 but forgetful. Pt passed [facility] swallow screen this AM. Clear liquid diet with fluid restriction placed. Complains of LUQ pain, MD made aware. New orders placed for PO meds including protonix. Flexiseal placed for liquid stools. Occult stool test positive result, MD made aware. Will continue to monitor.

## 2020-02-22 NOTE — Progress Notes (Signed)
Daily Progress Note   Patient Name: Shawn Taylor       Date: 02/22/2020 DOB: 1956-05-15  Age: 64 y.o. MRN#: [id number] Attending Physician: Erin Fulling, MD Primary Care Physician: Patient, No Pcp Per Admit Date: 02/13/2020  Reason for Consultation/Follow-up: Establishing goals of care  Subjective: Patient sitting in bed with his aunt at bedside. He is conversive and very upbeat. He states he has been told he is blessed to be alive. He denies complaint.   Discussed GOC. He states he would want his daughter Shawn Taylor to be his HPOA. He is not sure of the second person. He states when he leaves the hospital, he does not believe he would want to return, and would want to focus on comfort if he became sick enough to warrant a hospital again. He states he would not want resuscitation after leaving the hospital. He is considering continuing medical optimization at this time. He is unsure of code status while he is here, and states he would want to give this thought and speak with his family first.   Length of Stay: 9  Current Medications: Scheduled Meds:  . chlorhexidine  15 mL Mouth Rinse BID  . Chlorhexidine Gluconate Cloth  6 each Topical Daily  . folic acid  1 mg Intravenous Daily  . furosemide  40 mg Intravenous Daily  . isosorbide-hydrALAZINE  1 tablet Oral TID  . lisinopril  2.5 mg Oral Daily  . mouth rinse  15 mL Mouth Rinse q12n4p  . metoprolol tartrate  12.5 mg Oral BID  . multivitamin with minerals  1 tablet Per Tube Daily  . pantoprazole  40 mg Oral Daily  . [START ON 02/23/2020] predniSONE  40 mg Oral Q breakfast   Followed by  . [START ON 02/24/2020] predniSONE  30 mg Oral Q breakfast   Followed by  . [START ON 02/25/2020] predniSONE  20 mg Oral Q breakfast   Followed by  . [START  ON 02/26/2020] predniSONE  10 mg Oral Q breakfast  . sodium chloride flush  10-40 mL Intracatheter Q12H  . thiamine  100 mg Per Tube Daily    Continuous Infusions: . albumin human 12.5 g (02/22/20 1208)    PRN Meds: acetaminophen **OR** acetaminophen, loperamide HCl, midazolam, morphine injection, nitroGLYCERIN, sodium chloride flush  Physical Exam Pulmonary:  Effort: Pulmonary effort is normal.  Skin:    General: Skin is warm and dry.  Neurological:     Mental Status: He is alert.             Vital Signs: BP (!) 169/84   Pulse 94   Temp 97.6 F (36.4 C) (Oral)   Resp (!) 21   Ht 5\' 11"  (1.803 m)   Wt 93.1 kg   SpO2 97%   BMI 28.63 kg/m  SpO2: SpO2: 97 % O2 Device: O2 Device: Nasal Cannula O2 Flow Rate: O2 Flow Rate (L/min): 2 L/min  Intake/output summary:   Intake/Output Summary (Last 24 hours) at 02/22/2020 1412 Last data filed at 02/22/2020 1131 Gross per 24 hour  Intake 12.69 ml  Output 3200 ml  Net -3187.31 ml   LBM: Last BM Date: 02/22/20 Baseline Weight: Weight: 90.7 kg Most recent weight: Weight: 93.1 kg       Palliative Assessment/Data:      Patient Active Problem List   Diagnosis Date Noted  . Cardiac arrest, cause unspecified ([place]) 02/16/2020  . Liver failure ([place]) 02/16/2020  . Acute hypoxemic respiratory failure ([place]) 02/16/2020  . Atrial flutter ([place]) 02/13/2020    Palliative Care Assessment & Plan    Recommendations/Plan:  Chaplain paged to complete HPOA papers.  Patient considering care going forward.      Code Status:    Code Status Orders  (From admission, onward)         Start     Ordered   02/13/20 1736  Full code  Continuous     02/13/20 1738        Code Status History    This patient has a current code status but no historical code status.   Advance Care Planning Activity       Prognosis:   Unable to determine  Discharge Planning:  To Be Determined  Care plan was discussed with RN  Thank you  for allowing the Palliative Medicine Team to assist in the care of this patient.   Total Time 35 min Prolonged Time Billed  no      Greater than 50%  of this time was spent counseling and coordinating care related to the above assessment and plan.  Asencion Gowda, NP  Please contact Palliative Medicine Team phone at [phone] for questions and concerns.

## 2020-02-23 LAB — CBC WITH DIFFERENTIAL/PLATELET
Abs Immature Granulocytes: 0.12 10*3/uL — ABNORMAL HIGH (ref 0.00–0.07)
Basophils Absolute: 0 10*3/uL (ref 0.0–0.1)
Basophils Relative: 0 %
Eosinophils Absolute: 0 10*3/uL (ref 0.0–0.5)
Eosinophils Relative: 0 %
HCT: 45 % (ref 39.0–52.0)
Hemoglobin: 14.1 g/dL (ref 13.0–17.0)
Immature Granulocytes: 1 %
Lymphocytes Relative: 9 %
Lymphs Abs: 1.1 10*3/uL (ref 0.7–4.0)
MCH: 29.6 pg (ref 26.0–34.0)
MCHC: 31.3 g/dL (ref 30.0–36.0)
MCV: 94.3 fL (ref 80.0–100.0)
Monocytes Absolute: 0.7 10*3/uL (ref 0.1–1.0)
Monocytes Relative: 6 %
Neutro Abs: 9.8 10*3/uL — ABNORMAL HIGH (ref 1.7–7.7)
Neutrophils Relative %: 84 %
Platelets: 58 10*3/uL — ABNORMAL LOW (ref 150–400)
RBC: 4.77 MIL/uL (ref 4.22–5.81)
RDW: 15.1 % (ref 11.5–15.5)
WBC: 11.8 10*3/uL — ABNORMAL HIGH (ref 4.0–10.5)
nRBC: 0 % (ref 0.0–0.2)

## 2020-02-23 LAB — MAGNESIUM: Magnesium: 1.8 mg/dL (ref 1.7–2.4)

## 2020-02-23 LAB — BASIC METABOLIC PANEL
Anion gap: 10 (ref 5–15)
BUN: 52 mg/dL — ABNORMAL HIGH (ref 8–23)
CO2: 33 mmol/L — ABNORMAL HIGH (ref 22–32)
Calcium: 9 mg/dL (ref 8.9–10.3)
Chloride: 103 mmol/L (ref 98–111)
Creatinine, Ser: 1.52 mg/dL — ABNORMAL HIGH (ref 0.61–1.24)
GFR calc Af Amer: 56 mL/min — ABNORMAL LOW (ref >60)
GFR calc non Af Amer: 48 mL/min — ABNORMAL LOW (ref >60)
Glucose, Bld: 100 mg/dL — ABNORMAL HIGH (ref 70–99)
Potassium: 3.3 mmol/L — ABNORMAL LOW (ref 3.5–5.1)
Sodium: 146 mmol/L — ABNORMAL HIGH (ref 135–145)

## 2020-02-23 LAB — CULTURE, RESPIRATORY W GRAM STAIN

## 2020-02-23 LAB — GLUCOSE, CAPILLARY
Glucose-Capillary: 81 mg/dL (ref 70–99)
Glucose-Capillary: 111 mg/dL — ABNORMAL HIGH (ref 70–99)
Glucose-Capillary: 102 mg/dL — ABNORMAL HIGH (ref 70–99)

## 2020-02-23 LAB — PHOSPHORUS: Phosphorus: 3.5 mg/dL (ref 2.5–4.6)

## 2020-02-23 LAB — APTT
aPTT: 56 seconds — ABNORMAL HIGH (ref 24–36)
aPTT: 60 seconds — ABNORMAL HIGH (ref 24–36)

## 2020-02-23 LAB — TRIGLYCERIDES: Triglycerides: 152 mg/dL — ABNORMAL HIGH (ref <150)

## 2020-02-23 MED ORDER — MAGNESIUM SULFATE 2 GM/50ML IV SOLN
2.0000 g | Freq: Once | INTRAVENOUS | Status: AC
  Administered 2020-02-23: 2 g via INTRAVENOUS
  Filled 2020-02-23: qty 50

## 2020-02-23 MED ORDER — LOPERAMIDE HCL 2 MG PO CAPS: 2.0000 mg | ORAL_CAPSULE | ORAL | Status: DC | PRN

## 2020-02-23 MED ORDER — FOLIC ACID 1 MG PO TABS
1.0000 mg | ORAL_TABLET | Freq: Every day | ORAL | Status: DC
  Administered 2020-02-23 – 2020-03-07 (×13): 1 mg via ORAL
  Filled 2020-02-23 (×14): qty 1

## 2020-02-23 MED ORDER — TRAZODONE HCL 50 MG PO TABS
50.0000 mg | ORAL_TABLET | Freq: Every evening | ORAL | Status: DC | PRN
  Administered 2020-02-23 – 2020-03-04 (×2): 50 mg via ORAL
  Filled 2020-02-23: qty 1

## 2020-02-23 MED ORDER — ACETAMINOPHEN 650 MG RE SUPP: 650.0000 mg | Freq: Four times a day (QID) | RECTAL | Status: DC | PRN

## 2020-02-23 MED ORDER — ATORVASTATIN CALCIUM 20 MG PO TABS
40.0000 mg | ORAL_TABLET | Freq: Every day | ORAL | Status: DC
  Administered 2020-02-23 – 2020-03-06 (×13): 40 mg via ORAL
  Filled 2020-02-23 (×13): qty 2

## 2020-02-23 MED ORDER — BOOST / RESOURCE BREEZE PO LIQD CUSTOM
1.0000 | Freq: Three times a day (TID) | ORAL | Status: DC
  Administered 2020-02-23: 21:00:00 1 via ORAL

## 2020-02-23 MED ORDER — SPIRONOLACTONE 25 MG PO TABS
12.5000 mg | ORAL_TABLET | Freq: Every day | ORAL | Status: DC
  Administered 2020-02-24 – 2020-03-07 (×12): 12.5 mg via ORAL
  Filled 2020-02-23: qty 0.5
  Filled 2020-02-23 (×5): qty 1
  Filled 2020-02-23 (×6): qty 0.5
  Filled 2020-02-23 (×2): qty 1
  Filled 2020-02-23: qty 0.5
  Filled 2020-02-23 (×3): qty 1
  Filled 2020-02-23 (×3): qty 0.5
  Filled 2020-02-23 (×2): qty 1
  Filled 2020-02-23 (×2): qty 0.5

## 2020-02-23 MED ORDER — ACETAMINOPHEN 325 MG PO TABS
650.0000 mg | ORAL_TABLET | Freq: Four times a day (QID) | ORAL | Status: DC | PRN
  Administered 2020-02-29 – 2020-03-02 (×2): 650 mg via ORAL
  Filled 2020-02-23 (×2): qty 2

## 2020-02-23 MED ORDER — POTASSIUM CHLORIDE CRYS ER 20 MEQ PO TBCR
40.0000 meq | EXTENDED_RELEASE_TABLET | ORAL | Status: AC
  Administered 2020-02-23 (×2): 40 meq via ORAL
  Filled 2020-02-23 (×3): qty 2

## 2020-02-23 NOTE — Consult Note (Signed)
ANTICOAGULATION CONSULT NOTE  Pharmacy Consult for argatroban infusion Indication: DVT  Allergies  Allergen Reactions  . Heparin     Suspected HIT: labs pending   Patient Measurements: Height: 5\' 11"  (180.3 cm) Weight: 200 lb 6.4 oz (90.9 kg) IBW/kg (Calculated) : 75.3 Heparin Dosing Weight: 90.7 kg  Vital Signs: Temp: 98.2 F (36.8 C) (04/01 0100) Temp Source: Oral (04/01 0100) BP: 192/90 (03/31 2130) Pulse Rate: 89 (03/31 2130)  Labs: Recent Labs    02/21/20 0416 02/21/20 0416 02/22/20 0234 02/22/20 0407 02/22/20 0407 02/22/20 2102 02/23/20 0121 02/23/20 0443  HGB 14.4   < >  --  13.9  --   --  14.1  --   HCT 44.6  --   --  44.3  --   --  45.0  --   PLT 69*  --   --  58*  --   --  58*  --   APTT 60*   < >  --  27   < > 47* 56* 60*  CREATININE 2.26*  --   --  1.87*  --   --  1.52*  --   TROPONINIHS  --   --  120*  --   --   --   --   --    < > = values in this interval not displayed.    Estimated Creatinine Clearance: 57.3 mL/min (A) (by C-G formula based on SCr of 1.52 mg/dL (H)).   Medical History: Past Medical History:  Diagnosis Date  . Hypertension   . Migraines     Medications:  No anticoagulation prior to admission per chart  Assessment: Patient is a 64 y/o M with a history of hypertension, cocaine, tobacco, and alcohol use who presented to [facility] ED with new onset CHF and atrial flutter and subsequently went into cardiac arrest. Patient is intubated and mechanically ventilated for respiratory failure. Found to have multiple lower extremity DVTs on doppler. Initially started on therapeutic enoxaparin which was discontinued secondary to worsening renal function. Pharmacy was then consulted to initiate a heparin infusion for treatment of VTE. 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 Subsequently discontinued on 3/30 for concern for bleeding.  Pharmacy reconsulted 3/31 for restart of argatroban. Per MD, patient improving and less concern for bleeding.  HIT antibody negative, SRA pending.  Goal of Therapy:  aPTT range 50 - 90 seconds Monitor platelets by anticoagulation protocol: Yes   Plan:  Restart argatroban at 0.5 mcg/kg/min. Will check aPTT 2 hours after start of infusion. Will follow aPTT every 2 hours until therapeutic x 2 then daily thereafter. Continue to follow SRA results. CBC daily.  03/31: APTT @ 2102 = 47 Will increase argatroban infusion to 0.6 mcg/kg/min and recheck aPTT 2 hrs after rate change.   0401 0121 aPTT = 56, therapeutic x 1.  CBC stable.  Continue argatroban at current rate and recheck aPTT in 2 hours to confirm.    0401 0443 aPTT = 60, therapeutic x 2.  Continue argatroban at current rate and recheck aPTT in 4 hours.  2103 A Clinical Pharmacist 5:51 AM

## 2020-02-23 NOTE — Progress Notes (Signed)
9:40am - CH visited pt. briefly as follow up from yesterday.  Pt. awake and appeared in good spirits; said his dtr. and son would be here soon.  CH plans to folow up when family is here for AD completion, per pt.'s request.  CH requested RN page [facility] when family present.  11am - CH contacted by RN --> pt.'s son and dtr. arrived.  CH entered room and asked it pt. was ready to complete document.  Pt's dtr. Kashawna to be MPOA, pt. said.  He requested CH give her the document for perusal.  CH left document; will follow up after family have had time to discuss.  CH made it clear --document must reflect pt.'s wishes.  11:30am - CH reentered rm.; dtr. had questions IE:PPIRJJ Will question 1.  CH discussed Living Will w/pt. and family present in rm.  Pt. clear re: dtr. Lacretia Nicks being MPOA but wished to put all decisions re: Living Will in her hands.  Family protested that they needed to know what pt. would want; CH explained that completing Living Will would be a service to family members --would take away burden of decisionmaking.  Pt. came to understand the need to record his wishes in Living Will.  After discussion, pt. says he would not like to be kept on life support in any of the three scenarios in Living Will Q1 (incurable illness and beginning of organ failure, apparently permanent coma, advanced dementia and deterioration of brain function).  Pt. interested in comfort care and agreeable to AD being used by medical team.  After completing document CH left document in rm.  Will return later w/witness and notary to finalize.  2pm - CH returned w/notary NT Kathie Rhodes and two witnesses; pt. agreeable to signing document, but somewhat uncomfortable signing w/o any family members present. CH assured pt. that signing only finalized decisions made in earlier discussions --nothing had been added to AD finished earlier.  CH made copies for pt. and copy for [facility] chart/Vynca/Epic.  Left copies and original w/pt.  No further  needs expressed at this time.    02/23/20 1400  Clinical Encounter Type  Visited With Patient and family together;Health care provider  Visit Type Follow-up;Social support (Therapist, art)  Referral From Palliative care team;Family;Chaplain  Spiritual Encounters  Spiritual Needs Emotional  Stress Factors  Patient Stress Factors Other (Comment);Loss of control;Health changes;Major life changes (MPOA appointment)  Family Stress Factors Family relationships  Advance Directives (For Healthcare)  Does Patient Have a Medical Advance Directive? Yes  Does patient want to make changes to medical advance directive? No - Patient declined  Type of Advance Directive Living will;Healthcare Power of Attorney  Copy of Healthcare Power of Attorney in Chart? Yes - validated most recent copy scanned in chart (See row information)  Copy of Living Will in Chart? Yes - validated most recent copy scanned in chart (See row information)  Would patient like information on creating a medical advance directive? Yes (Inpatient - patient requests chaplain consult to create a medical advance directive)

## 2020-02-23 NOTE — Progress Notes (Signed)
CH visited pt. as follow up from previous visits and per request from palliative care to help pt. complete advanced directive.  Pt. occupied w/PT when [facility] arrived, but PT finished shortly afterwards.  CH asked if he needed help completing AD; pt. said he did but wanted his aunt to be present in rm.  Aunt, waiting in ICU waiting rm. said she'd prefer to wait until pt. had had bedding changed by nurse before reentering rm.  CH waited outside rm. until RN/NT had finished changing pt.'s clothes/bedding.  When pt. ready for visit, CH and family members reentered; pt. now said that he did not want to sign/complete AD until his sister who will be made MPOA could be present; said she will come tomorrow and asked politely but firmly for [facility] to 'leave him alone for today'.  CH excused himself and updated palliative care NP.  CH plans to follow up w/pt. Thursday and hopes to get AD signed and notarized.      02/22/20 1400  Clinical Encounter Type  Visited With Health care provider;Patient and family together  Visit Type Follow-up;Psychological support (Advanced Directive Education/Completion)  Referral From Palliative care team  Consult/Referral To Chaplain  Spiritual Encounters  Spiritual Needs Emotional  Stress Factors  Patient Stress Factors Financial concerns;Health changes;Other (Comment) (Overwhelmed)

## 2020-02-23 NOTE — Progress Notes (Addendum)
Daily Progress Note   Patient Name: Shawn Taylor       Date: 02/23/2020 DOB: 08-Oct-1956  Age: 64 y.o. MRN#: [id number] Attending Physician: Darlin Priestly, MD Primary Care Physician: Patient, No Pcp Per Admit Date: 02/13/2020  Reason for Consultation/Follow-up: Establishing goals of care  Subjective: Patient sitting in bed. He denies pain or other issue. He states he has decided he wants to continue aggressive care at this time. He is considering how he would want to proceed with care following D/C. Recommend palliative at D/C to continue GOC conversations.  Length of Stay: 10  Current Medications: Scheduled Meds:  . chlorhexidine  15 mL Mouth Rinse BID  . Chlorhexidine Gluconate Cloth  6 each Topical Daily  . folic acid  1 mg Oral Daily  . furosemide  40 mg Intravenous Daily  . isosorbide-hydrALAZINE  1 tablet Oral TID  . lisinopril  2.5 mg Oral Daily  . mouth rinse  15 mL Mouth Rinse q12n4p  . metoprolol tartrate  12.5 mg Oral BID  . multivitamin with minerals  1 tablet Per Tube Daily  . pantoprazole  40 mg Oral Daily  . potassium chloride  40 mEq Oral Q4H  . [START ON 02/24/2020] predniSONE  30 mg Oral Q breakfast   Followed by  . [START ON 02/25/2020] predniSONE  20 mg Oral Q breakfast   Followed by  . [START ON 02/26/2020] predniSONE  10 mg Oral Q breakfast  . sodium chloride flush  10-40 mL Intracatheter Q12H  . thiamine  100 mg Per Tube Daily    Continuous Infusions: . argatroban 0.6 mcg/kg/min (02/23/20 0521)  . magnesium sulfate bolus IVPB      PRN Meds: acetaminophen **OR** acetaminophen, loperamide, morphine injection, nitroGLYCERIN, sodium chloride flush, traZODone  Physical Exam Pulmonary:     Effort: Pulmonary effort is normal.  Skin:    General: Skin is warm and dry.   Neurological:     Mental Status: He is alert.             Vital Signs: BP (!) 156/87 (BP Location: Right Arm)   Pulse 86   Temp 98.5 F (36.9 C)   Resp 16   Ht 5\' 11"  (1.803 m)   Wt 91.4 kg   SpO2 97%   BMI 28.10 kg/m  SpO2: SpO2: 97 %  O2 Device: O2 Device: Nasal Cannula O2 Flow Rate: O2 Flow Rate (L/min): 2 L/min  Intake/output summary:   Intake/Output Summary (Last 24 hours) at 02/23/2020 1328 Last data filed at 02/23/2020 1021 Gross per 24 hour  Intake 1504.86 ml  Output 3225 ml  Net -1720.14 ml   LBM: Last BM Date: 02/22/20 Baseline Weight: Weight: 90.7 kg Most recent weight: Weight: 91.4 kg       Palliative Assessment/Data:      Patient Active Problem List   Diagnosis Date Noted  . Cardiac arrest, cause unspecified ([place]) 02/16/2020  . Liver failure ([place]) 02/16/2020  . Acute hypoxemic respiratory failure ([place]) 02/16/2020  . Atrial flutter ([place]) 02/13/2020    Palliative Care Assessment & Plan    Recommendations/Plan:  Chaplain to complete HPOA papers.  Patient wants to continue aggressive care at this time. He is considering care moving forward after hospitalization. Recommend palliative at D/C to continue [place] conversations.     Code Status:    Code Status Orders  (From admission, onward)         Start     Ordered   02/13/20 1736  Full code  Continuous     02/13/20 1738        Code Status History    This patient has a current code status but no historical code status.   Advance Care Planning Activity       Prognosis:   Unable to determine  Discharge Planning:  To Be Determined  Care plan was discussed with RN  Thank you for allowing the Palliative Medicine Team to assist in the care of this patient.   Total Time 35 min Prolonged Time Billed  no      Greater than 50%  of this time was spent counseling and coordinating care related to the above assessment and plan.  Asencion Gowda, NP  Please contact Palliative  Medicine Team phone at [phone] for questions and concerns.

## 2020-02-23 NOTE — Progress Notes (Signed)
Pt had an uneventful shift. Pt remains alert and oriented. No c/o pain. Pt transferred to telemetry room #258 report given to denise RN.

## 2020-02-23 NOTE — Consult Note (Signed)
ANTICOAGULATION CONSULT NOTE  Pharmacy Consult for argatroban infusion Indication: DVT  Allergies  Allergen Reactions  . Heparin     Suspected HIT: labs pending   Patient Measurements: Height: 5\' 11"  (180.3 cm) Weight: 91.4 kg (201 lb 8 oz) IBW/kg (Calculated) : 75.3 Heparin Dosing Weight: 90.7 kg  Vital Signs: Temp: 98 F (36.7 C) (04/01 0703) Temp Source: Oral (04/01 0703) BP: 156/93 (04/01 0716) Pulse Rate: 84 (04/01 0716)  Labs: Recent Labs    02/21/20 0416 02/21/20 0416 02/22/20 0234 02/22/20 0407 02/22/20 0407 02/22/20 2102 02/23/20 0121 02/23/20 0443  HGB 14.4   < >  --  13.9  --   --  14.1  --   HCT 44.6  --   --  44.3  --   --  45.0  --   PLT 69*  --   --  58*  --   --  58*  --   APTT 60*   < >  --  27   < > 47* 56* 60*  CREATININE 2.26*  --   --  1.87*  --   --  1.52*  --   TROPONINIHS  --   --  120*  --   --   --   --   --    < > = values in this interval not displayed.    Estimated Creatinine Clearance: 57.5 mL/min (A) (by C-G formula based on SCr of 1.52 mg/dL (H)).   Medical History: Past Medical History:  Diagnosis Date  . Hypertension   . Migraines     Medications:  No anticoagulation prior to admission per chart  Assessment: Patient is a 64 y/o M with a history of hypertension, cocaine, tobacco, and alcohol use who presented to [facility] ED with new onset CHF and atrial flutter and subsequently went into cardiac arrest. Patient is intubated and mechanically ventilated for respiratory failure. Found to have multiple lower extremity DVTs on doppler. Initially started on therapeutic enoxaparin which was discontinued secondary to worsening renal function. Pharmacy was then consulted to initiate a heparin infusion for treatment of VTE. 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 Subsequently discontinued on 3/30 for concern for bleeding.  Pharmacy reconsulted 3/31 for restart of argatroban. Per MD, patient improving and less concern for bleeding.  HIT antibody negative, SRA pending.  Goal of Therapy:  aPTT range 50 - 90 seconds Monitor platelets by anticoagulation protocol: Yes   Plan:  APTT is therapeutic x 2. Continue argatroban at current rate and recheck aPTT with AM labs. CBC daily.   4/31, PharmD, BCPS Clinical Pharmacist 8:23 AM

## 2020-02-23 NOTE — Progress Notes (Signed)
PROGRESS NOTE    Shawn Taylor  TZG:[id number] DOB: June 19, 1956 DOA: 02/13/2020 PCP: Patient, No Pcp Per    Assessment & Plan:   Active Problems:   Atrial flutter (HCC)   Cardiac arrest, cause unspecified (HCC)   Liver failure (HCC)   Acute hypoxemic respiratory failure (HCC)    Shawn Taylor is a 64 yo male admitted s/p respiratory arrest followed by cardiac arrest, cardiogenic shock, pulmonary edema, severe metabolic acidosis, BLE DVT'sconcerning for possible pulmonary embolismsrequiring mechanical intubation and levophed gtt. he was admitted on 3/22 with acute hypoxic respiratory failure secondary to metabolic acidosis and pulmonary edema.  Found with a flutter RVR.  While bed availability in stepdown unit was pending patient had respiratory arrest which led to cardiac arrest and he was mechanically intubated and admitted to the ICU.  An echocardiogram revealed severe systolic dysfunction.  He is status post extubation on BiPAP.  He had multiorgan failure.  Echo with EF less than 20%.  With bilateral occlusive DVT of lower extremity.  He has significant thrombocytopenia post heparin infusion.  Patient was switched to argatroban with pharmacy consult.  His BNP is trending down.  Tolerating Lasix.  Transferred to the hospitalist service on 02/23/2020   Acute Hypoxic Respiratory Failure -possibly due to flash pulmonary edema vs PE  -continue Bronchodilator Therapy -Wean O2 as able  Acute on chronic systolic CHF with EF<20% -background of accelerated Atrial fultter  -cardiology on case - appreciate input  - BNP >900 trending to 500 -oxygen as needed -IV lasix 40 mg daily  Bilateral occlusive DVT of lower extermities  - significant thrombocytopenia post Heparin infusion -PF4 and Serotonin release in process  - patient on argatroban with pharmacy consult  Discontinue Foley today and bladder scan   DVT prophylaxis: PR:[id number] Code Status: Full code  Family Communication: Son  updated at bedside today Disposition Plan: Likely SNF, still need PT eval, likely several more days.   Subjective and Interval History:  Pt reported feeling well, no complaints.  No fever, dyspnea, chest pain, abdominal pain, N/V/D, dysuria.   Objective: Vitals:   02/23/20 0716 02/23/20 1140 02/23/20 1634 02/23/20 2003  BP: (!) 156/93 (!) 156/87 (!) 145/88 (!) 148/107  Pulse: 84 86 89 91  Resp:  16 18 19   Temp:  98.5 F (36.9 C) 98.3 F (36.8 C) 98.7 F (37.1 C)  TempSrc:    Oral  SpO2:  97% 97% 94%  Weight:      Height:        Intake/Output Summary (Last 24 hours) at 02/23/2020 2021 Last data filed at 02/23/2020 1800 Gross per 24 hour  Intake 1753.63 ml  Output 3325 ml  Net -1571.37 ml   Filed Weights   02/22/20 0359 02/23/20 0400 02/23/20 0703  Weight: 93.1 kg 90.9 kg 91.4 kg    Examination:   Constitutional: NAD, AAOx3 HEENT: conjunctivae icterus, lids normal, EOMI CV: RRR no M,R,G. Distal pulses +2.  No cyanosis.   RESP: CTA B/L, normal respiratory effort, on 2L GI: +BS, NTND Extremities: Pitting edema in BLE SKIN: warm, dry and intact Neuro: II - XII grossly intact.  Sensation intact Psych: Normal mood and affect.     Data Reviewed: I have personally reviewed following labs and imaging studies  CBC: Recent Labs  Lab 02/19/20 0430 02/20/20 0324 02/21/20 0416 02/22/20 0407 02/23/20 0121  WBC 8.7 8.3 9.5 11.6* 11.8*  NEUTROABS 7.3 7.0 8.2* 10.1* 9.8*  HGB 13.8 13.9 14.4 13.9 14.1  HCT 41.4 41.9 44.6  44.3 45.0  MCV 90.6 90.9 92.5 94.5 94.3  PLT 88* 77* 69* 58* 58*   Basic Metabolic Panel: Recent Labs  Lab 02/19/20 0430 02/20/20 0324 02/21/20 0416 02/22/20 0407 02/23/20 0121  NA 138 141 144 149* 146*  K 3.7 3.7 4.0 3.5 3.3*  CL 96* 98 102 106 103  CO2 32 30 29 30  33*  GLUCOSE 140* 164* 182* 116* 100*  BUN 68* 64* 61* 64* 52*  CREATININE 2.79* 2.44* 2.26* 1.87* 1.52*  CALCIUM 8.3* 8.4* 8.7* 9.1 9.0  MG 1.8 1.8 1.9 2.0 1.8  PHOS 4.8* 5.7*  5.3* 4.5 3.5   GFR: Estimated Creatinine Clearance: 57.5 mL/min (A) (by C-G formula based on SCr of 1.52 mg/dL (H)). Liver Function Tests: Recent Labs  Lab 02/18/20 0445 02/19/20 0430 02/20/20 0324 02/21/20 0416 02/22/20 0407  AST 530* 274* 139* 85* 101*  ALT 802* 613* 420* 307* 251*  ALKPHOS 107 101 92 92 91  BILITOT 3.2* 3.2* 3.3* 3.3* 4.4*  PROT 5.9* 5.8* 5.9* 6.0* 6.5  ALBUMIN 2.4* 2.3* 2.3* 2.5* 2.8*   No results for input(s): LIPASE, AMYLASE in the last 168 hours. No results for input(s): AMMONIA in the last 168 hours. Coagulation Profile: No results for input(s): INR, PROTIME in the last 168 hours. Cardiac Enzymes: No results for input(s): CKTOTAL, CKMB, CKMBINDEX, TROPONINI in the last 168 hours. BNP (last 3 results) No results for input(s): PROBNP in the last 8760 hours. HbA1C: No results for input(s): HGBA1C in the last 72 hours. CBG: Recent Labs  Lab 02/22/20 0718 02/22/20 1145 02/23/20 0738 02/23/20 1143 02/23/20 1636  GLUCAP 98 120* 81 111* 102*   Lipid Profile: Recent Labs    02/23/20 0443  TRIG 152*   Thyroid Function Tests: No results for input(s): TSH, T4TOTAL, FREET4, T3FREE, THYROIDAB in the last 72 hours. Anemia Panel: No results for input(s): VITAMINB12, FOLATE, FERRITIN, TIBC, IRON, RETICCTPCT in the last 72 hours. Sepsis Labs: Recent Labs  Lab 02/20/20 0324 02/21/20 0416  PROCALCITON 0.97 0.65    Recent Results (from the past 240 hour(s))  CULTURE, BLOOD (ROUTINE X 2) w Reflex to ID Panel     Status: None   Collection Time: 02/14/20  3:42 AM   Specimen: BLOOD  Result Value Ref Range Status   Specimen Description BLOOD LEFT HAND  Final   Special Requests   Final    BOTTLES DRAWN AEROBIC AND ANAEROBIC Blood Culture adequate volume   Culture   Final    NO GROWTH 5 DAYS Performed at [facility], [address] [place], [place]    Report Status 02/19/2020 FINAL  Final  CULTURE, BLOOD (ROUTINE X 2) w Reflex  to ID Panel     Status: None   Collection Time: 02/14/20  3:42 AM   Specimen: BLOOD  Result Value Ref Range Status   Specimen Description BLOOD RIGHT HAND  Final   Special Requests   Final    BOTTLES DRAWN AEROBIC AND ANAEROBIC Blood Culture results may not be optimal due to an inadequate volume of blood received in culture bottles   Culture   Final    NO GROWTH 5 DAYS Performed at [facility], [address], [place], [place]    Report Status 02/19/2020 FINAL  Final  MRSA PCR Screening     Status: None   Collection Time: 02/14/20  3:49 AM   Specimen: Nasopharyngeal  Result Value Ref Range Status   MRSA by PCR NEGATIVE NEGATIVE  Final    Comment:        The GeneXpert MRSA Assay (FDA approved for NASAL specimens only), is one component of a comprehensive MRSA colonization surveillance program. It is not intended to diagnose MRSA infection nor to guide or monitor treatment for MRSA infections. Performed at [facility], [address] [place], [place] [address]   Culture, respiratory     Status: None   Collection Time: 02/15/20  9:46 AM   Specimen: Tracheal Aspirate  Result Value Ref Range Status   Specimen Description   Final    TRACHEAL ASPIRATE Performed at [facility], [address] [place], [place] [address]    Special Requests   Final    NONE Performed at [facility], [address] [place], [place] [address]    Gram Stain   Final    ABUNDANT WBC PRESENT,BOTH PMN AND MONONUCLEAR FEW GRAM NEGATIVE RODS FEW GRAM POSITIVE COCCI RARE YEAST    Culture   Final    Consistent with normal respiratory flora. Performed at [facility], [address] [place], [place] [address]    Report Status 02/17/2020 FINAL  Final  Culture, respiratory (non-expectorated)     Status: None   Collection Time: 02/20/20 12:18 PM   Specimen: Tracheal Aspirate; Respiratory  Result Value Ref Range Status   Specimen Description    Final    TRACHEAL ASPIRATE Performed at [facility], [address], [place], [place] [address]    Special Requests   Final    NONE Performed at [facility], [address] [place], [place] [address]    Gram Stain   Final    MODERATE WBC PRESENT, PREDOMINANTLY PMN NO ORGANISMS SEEN Performed at [facility], [address], [place], [place] [address]    Culture RARE CANDIDA ALBICANS  Final   Report Status 02/23/2020 FINAL  Final      Radiology Studies: DG Chest Port 1 View  Result Date: 02/22/2020 CLINICAL DATA:  Atrial fibrillation.  Shortness of breath. EXAM: PORTABLE CHEST 1 VIEW COMPARISON:  02/16/2020 FINDINGS: Cardiomegaly. Aortic atherosclerosis. Left internal jugular central line tip in the SVC just above the right atrium. Pulmonary edema with interstitial and early alveolar edema. Small amount of pleural fluid. Atelectasis in both lower lungs left than right, slightly improved since the study of 03/25. IMPRESSION: Persistent congestive heart failure pattern. Slightly better aeration of the lung bases. Electronically Signed   By: Paulina Fusi M.D.   On: 02/22/2020 11:13     Scheduled Meds: . atorvastatin  40 mg Oral Q2200  . chlorhexidine  15 mL Mouth Rinse BID  . Chlorhexidine Gluconate Cloth  6 each Topical Daily  . feeding supplement  1 Container Oral TID BM  . folic acid  1 mg Oral Daily  . furosemide  40 mg Intravenous Daily  . isosorbide-hydrALAZINE  1 tablet Oral TID  . lisinopril  2.5 mg Oral Daily  . mouth rinse  15 mL Mouth Rinse q12n4p  . metoprolol tartrate  12.5 mg Oral BID  . multivitamin with minerals  1 tablet Per Tube Daily  . pantoprazole  40 mg Oral Daily  . [START ON 02/24/2020] predniSONE  30 mg Oral Q breakfast   Followed by  . [START ON 02/25/2020] predniSONE  20 mg Oral Q breakfast   Followed by  . [START ON 02/26/2020] predniSONE  10 mg Oral Q breakfast  . sodium chloride flush  10-40 mL  Intracatheter Q12H  . [START  ON 02/24/2020] spironolactone  12.5 mg Oral Daily  . thiamine  100 mg Per Tube Daily   Continuous Infusions: . argatroban 0.6 mcg/kg/min (02/23/20 0521)     LOS: 10 days     Enzo Bi, MD [facility] If 7PM-7AM, please contact night-coverage 02/23/2020, 8:21 PM

## 2020-02-23 NOTE — Consult Note (Signed)
ANTICOAGULATION CONSULT NOTE  Pharmacy Consult for argatroban infusion Indication: DVT  Allergies  Allergen Reactions  . Heparin     Suspected HIT: labs pending   Patient Measurements: Height: 5\' 11"  (180.3 cm) Weight: 205 lb 4 oz (93.1 kg) IBW/kg (Calculated) : 75.3 Heparin Dosing Weight: 90.7 kg  Vital Signs: Temp: 98.2 F (36.8 C) (04/01 0100) Temp Source: Oral (04/01 0100) BP: 192/90 (03/31 2130) Pulse Rate: 89 (03/31 2130)  Labs: Recent Labs    02/20/20 0324 02/20/20 1454 02/21/20 0416 02/21/20 0416 02/22/20 0234 02/22/20 0407 02/22/20 2102 02/23/20 0121  HGB 13.9  --  14.4   < >  --  13.9  --  14.1  HCT 41.9  --  44.6  --   --  44.3  --  45.0  PLT 77*  --  69*  --   --  58*  --  58*  APTT  --    < > 60*   < >  --  27 47* 56*  HEPARINUNFRC 0.56  --   --   --   --   --   --   --   CREATININE 2.44*  --  2.26*  --   --  1.87*  --   --   TROPONINIHS  --   --   --   --  120*  --   --   --    < > = values in this interval not displayed.    Estimated Creatinine Clearance: 47.1 mL/min (A) (by C-G formula based on SCr of 1.87 mg/dL (H)).   Medical History: Past Medical History:  Diagnosis Date  . Hypertension   . Migraines     Medications:  No anticoagulation prior to admission per chart  Assessment: Patient is a 64 y/o M with a history of hypertension, cocaine, tobacco, and alcohol use who presented to [facility] ED with new onset CHF and atrial flutter and subsequently went into cardiac arrest. Patient is intubated and mechanically ventilated for respiratory failure. Found to have multiple lower extremity DVTs on doppler. Initially started on therapeutic enoxaparin which was discontinued secondary to worsening renal function. Pharmacy was then consulted to initiate a heparin infusion for treatment of VTE. 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  Subsequently discontinued on 3/30 for concern for bleeding. Pharmacy reconsulted 3/31 for restart of argatroban. Per MD, patient improving and less concern for bleeding.  HIT antibody negative, SRA pending.  Goal of Therapy:  aPTT range 50 - 90 seconds Monitor platelets by anticoagulation protocol: Yes   Plan:  Restart argatroban at 0.5 mcg/kg/min. Will check aPTT 2 hours after start of infusion. Will follow aPTT every 2 hours until therapeutic x 2 then daily thereafter. Continue to follow SRA results. CBC daily.  03/31: APTT @ 2102 = 47 Will increase argatroban infusion to 0.6 mcg/kg/min and recheck aPTT 2 hrs after rate change.   0401 0121 aPTt = 56, therapeutic x 1.  CBC stable.  Continue argatroban at current rate and recheck aPTT in 2 hours to confirm.    2103 A Clinical Pharmacist 1:54 AM

## 2020-02-24 LAB — GLUCOSE, CAPILLARY
Glucose-Capillary: 115 mg/dL — ABNORMAL HIGH (ref 70–99)
Glucose-Capillary: 161 mg/dL — ABNORMAL HIGH (ref 70–99)
Glucose-Capillary: 165 mg/dL — ABNORMAL HIGH (ref 70–99)

## 2020-02-24 LAB — BASIC METABOLIC PANEL
Anion gap: 7 (ref 5–15)
BUN: 38 mg/dL — ABNORMAL HIGH (ref 8–23)
CO2: 31 mmol/L (ref 22–32)
Calcium: 8.6 mg/dL — ABNORMAL LOW (ref 8.9–10.3)
Chloride: 103 mmol/L (ref 98–111)
Creatinine, Ser: 1.36 mg/dL — ABNORMAL HIGH (ref 0.61–1.24)
GFR calc Af Amer: >60 mL/min (ref >60)
GFR calc non Af Amer: 55 mL/min — ABNORMAL LOW (ref >60)
Glucose, Bld: 113 mg/dL — ABNORMAL HIGH (ref 70–99)
Potassium: 3.7 mmol/L (ref 3.5–5.1)
Sodium: 141 mmol/L (ref 135–145)

## 2020-02-24 LAB — CBC
HCT: 42.1 % (ref 39.0–52.0)
Hemoglobin: 13.3 g/dL (ref 13.0–17.0)
MCH: 29.6 pg (ref 26.0–34.0)
MCHC: 31.6 g/dL (ref 30.0–36.0)
MCV: 93.6 fL (ref 80.0–100.0)
Platelets: 67 10*3/uL — ABNORMAL LOW (ref 150–400)
RBC: 4.5 MIL/uL (ref 4.22–5.81)
RDW: 14.8 % (ref 11.5–15.5)
WBC: 17 10*3/uL — ABNORMAL HIGH (ref 4.0–10.5)
nRBC: 0 % (ref 0.0–0.2)

## 2020-02-24 LAB — MAGNESIUM: Magnesium: 1.8 mg/dL (ref 1.7–2.4)

## 2020-02-24 LAB — PHOSPHORUS: Phosphorus: 3 mg/dL (ref 2.5–4.6)

## 2020-02-24 MED ORDER — ENSURE ENLIVE PO LIQD
237.0000 mL | Freq: Three times a day (TID) | ORAL | Status: DC
  Administered 2020-02-24 – 2020-03-07 (×31): 237 mL via ORAL

## 2020-02-24 MED ORDER — APIXABAN 5 MG PO TABS
5.0000 mg | ORAL_TABLET | Freq: Two times a day (BID) | ORAL | Status: DC
  Administered 2020-03-02 – 2020-03-07 (×11): 5 mg via ORAL
  Filled 2020-02-24 (×11): qty 1

## 2020-02-24 MED ORDER — APIXABAN 5 MG PO TABS
10.0000 mg | ORAL_TABLET | Freq: Two times a day (BID) | ORAL | Status: AC
  Administered 2020-02-24 – 2020-03-01 (×13): 10 mg via ORAL
  Filled 2020-02-24 (×14): qty 2

## 2020-02-24 NOTE — Evaluation (Signed)
Clinical/Bedside Swallow Evaluation Patient Details  Name: Shawn Taylor MRN: [id number] Date of Birth: Sep 07, 1956  Today's Date: 02/24/2020 Time: SLP Start Time (ACUTE ONLY): 1459 SLP Stop Time (ACUTE ONLY): 1559 SLP Time Calculation (min) (ACUTE ONLY): 60 min  Past Medical History:  Past Medical History:  Diagnosis Date  . Hypertension   . Migraines    Past Surgical History: History reviewed. No pertinent surgical history. HPI:  Shawn Taylor is a 64 yo male admitted s/p respiratory arrest followed by cardiac arrest, cardiogenic shock, pulmonary edema, severe metabolic acidosis, BLE DVT's concerning for possible pulmonary embolisms requiring mechanical intubation and levophed gtt. he was admitted on 3/22 with acute hypoxic respiratory failure secondary to metabolic acidosis and pulmonary edema.  Found with a flutter RVR.  While bed availability in stepdown unit was pending patient had respiratory arrest which led to cardiac arrest and he was mechanically intubated and admitted to the ICU.  An echocardiogram revealed severe systolic dysfunction.  He is status post extubation on BiPAP.  He had multiorgan failure.  Echo with EF less than 20%.  With bilateral occlusive DVT of lower extremity.  He has significant thrombocytopenia post heparin infusion.  Patient was switched to argatroban with pharmacy consult.  His BNP is trending down.  Tolerating Lasix.  Transferred to the hospitalist service on 02/23/2020.    Latest chest X-ray, 02/22/2020, Persistent congestive heart failure pattern. Slightly better aeration of the lung bases.    Medications include pantoprazole.     Assessment / Plan / Recommendation Clinical Impression  The patient appears to be at minimal risk for prandial aspiration.  He is able to drink thin liquid via straw with no cough, vocal quality change, or other significant clinical indicators of aspiration.  He is able to masticate a cracker and swallow with no immediate clinical  indicators of aspiration or oral residue.  The patient is observed to become short of breath at times and to cough after solid POs.  Given the improving chest X-ray, use of a proton pump inhibiter, and cough with solids not liquids; the coughing appears to indicate an esophageal component to dysphagia. Recommend dysphagia 3 diet with thin liquids.  Patient should observe reflux and aspiration precautions.   SLP Visit Diagnosis: Dysphagia, pharyngoesophageal phase (R13.14)    Aspiration Risk  Mild aspiration risk    Diet Recommendation Dysphagia 3 (Mech soft);Thin liquid   Liquid Administration via: Straw Medication Administration: Whole meds with puree Supervision: Staff to assist with self feeding Compensations: Minimize environmental distractions;Slow rate;Small sips/bites;Follow solids with liquid Postural Changes: Seated upright at 90 degrees;Remain upright for at least 30 minutes after po intake    Other  Recommendations Recommended Consults: Consider GI evaluation Oral Care Recommendations: Oral care BID;Staff/trained caregiver to provide oral care   Follow up Recommendations Other (comment)(TBD)      Frequency and Duration min 2x/week  2 weeks       Prognosis Prognosis for Safe Diet Advancement: Good      Swallow Study   General Date of Onset: 02/13/20 HPI: Shawn Taylor is a 64 yo male admitted s/p respiratory arrest followed by cardiac arrest, cardiogenic shock, pulmonary edema, severe metabolic acidosis, BLE DVT's concerning for possible pulmonary embolisms requiring mechanical intubation and levophed gtt. he was admitted on 3/22 with acute hypoxic respiratory failure secondary to metabolic acidosis and pulmonary edema.  Found with a flutter RVR.  While bed availability in stepdown unit was pending patient had respiratory arrest which led to cardiac arrest and he  was mechanically intubated and admitted to the ICU.  An echocardiogram revealed severe systolic dysfunction.  He  is status post extubation on BiPAP.  He had multiorgan failure.  Echo with EF less than 20%.  With bilateral occlusive DVT of lower extremity.  He has significant thrombocytopenia post heparin infusion.  Patient was switched to argatroban with pharmacy consult.  His BNP is trending down.  Tolerating Lasix.  Transferred to the hospitalist service on 02/23/2020.  Latest chest X-ray, 02/22/2020, Persistent congestive heart failure pattern. Slightly better aeration of the lung bases.  Medications include pantoprazole.   Type of Study: Bedside Swallow Evaluation Previous Swallow Assessment: None Diet Prior to this Study: Dysphagia 2 (chopped);Thin liquids Behavior/Cognition: Alert;Cooperative;Pleasant mood Oral Cavity Assessment: Within Functional Limits Oral Cavity - Dentition: Adequate natural dentition Self-Feeding Abilities: Able to feed self Patient Positioning: Upright in bed Baseline Vocal Quality: Normal Volitional Cough: Strong Volitional Swallow: Able to elicit    Oral/Motor/Sensory Function Overall Oral Motor/Sensory Function: Within functional limits   Ice Chips     Thin Liquid Thin Liquid: Within functional limits Presentation: Straw;Self Fed    Nectar Thick     Honey Thick     Puree     Solid     Solid: Within functional limits Presentation: Self Fed     Shawn Sea, MS/CCC- SLP  Lou Miner 02/24/2020,3:59 PM

## 2020-02-24 NOTE — Progress Notes (Signed)
PROGRESS NOTE    Shawn Taylor  EHM:[id number] DOB: 07/11/1956 DOA: 02/13/2020 PCP: Patient, No Pcp Per    Assessment & Plan:   Active Problems:   Atrial flutter (HCC)   Cardiac arrest, cause unspecified (HCC)   Liver failure (HCC)   Acute hypoxemic respiratory failure (HCC)    Shawn Taylor is a 64 yo AA male admitted s/p respiratory arrest followed by cardiac arrest, cardiogenic shock, pulmonary edema, severe metabolic acidosis, BLE DVT'sconcerning for possible pulmonary embolismsrequiring mechanical intubation and levophed gtt. he was admitted on 3/22 with acute hypoxic respiratory failure secondary to metabolic acidosis and pulmonary edema.  Found with a flutter RVR.  While bed availability in stepdown unit was pending patient had respiratory arrest which led to cardiac arrest and he was mechanically intubated and admitted to the ICU.  An echocardiogram revealed severe systolic dysfunction.  He is status post extubation on BiPAP.  He had multiorgan failure.  Echo with EF less than 20%.  With bilateral occlusive DVT of lower extremity.  He has significant thrombocytopenia post heparin infusion.  Patient was switched to argatroban with pharmacy consult.  His BNP is trending down.  Tolerating Lasix.  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 - Transition to Eliquis today.  Dysphagia --pt reported difficult swallowing solids this morning, but not  liquids. --SLP eval --change to Dys 1 diet for now  Discontinue Foley on 4/1, no issue with retention.  Rectal tube in for watery stool.   DVT prophylaxis: ZM:OQHUTML Code Status: Full code  Palliative care following Family Communication:  Disposition Plan: SNF rehab, though pt has no insurance, unknown when will have a bed.   Subjective and Interval History:  Pt reported feeling ok, but having trouble swallowing solid foods (started on Dys 2 diet last night), but no problem with liquids.  Foley d/c'ed last night and no problem with retention.  No fever, dyspnea, chest pain, abdominal pain, N/V, dysuria.    Objective: Vitals:   02/24/20 0340 02/24/20 0500 02/24/20 0811 02/24/20 1202  BP: (!) 137/101  (!) 149/97 (!) 145/82  Pulse: 90  83 84  Resp: 16  19 19   Temp: 98.1 F (36.7 C)  98.3 F (36.8 C) 98 F (36.7 C)  TempSrc: Oral  Oral Oral  SpO2: 94%  96% 95%  Weight:  93.2 kg    Height:        Intake/Output Summary (Last 24 hours) at 02/24/2020 1556 Last data filed at 02/24/2020 1330 Gross per 24 hour  Intake 1134 ml  Output 2900 ml  Net -1766 ml   Filed Weights   02/23/20 0400 02/23/20 0703 02/24/20 0500  Weight: 90.9 kg 91.4 kg 93.2 kg    Examination:   Constitutional: NAD, AAOx3 HEENT: conjunctivae icterus, lids normal, EOMI CV: RRR no M,R,G. Distal pulses +2.  No cyanosis.   RESP: CTA B/L, normal respiratory effort, on 2L GI: +BS, NTND Extremities: 2+ Pitting edema in BLE up to knees SKIN: warm, dry and  intact Neuro: II - XII grossly intact.  Sensation intact Psych: Normal mood and affect.     Data Reviewed: I have personally reviewed following labs and imaging studies  CBC: Recent Labs  Lab 02/19/20 0430 02/19/20 0430 02/20/20 0324 02/21/20 0416 02/22/20 0407 02/23/20 0121 02/24/20 0907  WBC 8.7   < > 8.3 9.5 11.6* 11.8* 17.0*  NEUTROABS 7.3  --  7.0 8.2* 10.1* 9.8*  --   HGB 13.8   < > 13.9 14.4 13.9 14.1 13.3  HCT 41.4   < > 41.9 44.6 44.3  45.0 42.1  MCV 90.6   < > 90.9 92.5 94.5 94.3 93.6  PLT 88*   < > 77* 69* 58* 58* 67*   < > = values in this interval not displayed.   Basic Metabolic Panel: Recent Labs  Lab 02/20/20 0324 02/21/20 0416 02/22/20 0407 02/23/20 0121 02/24/20 0907  NA 141 144 149* 146* 141  K 3.7 4.0 3.5 3.3* 3.7  CL 98 102 106 103 103  CO2 30 29 30  33* 31  GLUCOSE 164* 182* 116* 100* 113*  BUN 64* 61* 64* 52* 38*  CREATININE 2.44* 2.26* 1.87* 1.52* 1.36*  CALCIUM 8.4* 8.7* 9.1 9.0 8.6*  MG 1.8 1.9 2.0 1.8 1.8  PHOS 5.7* 5.3* 4.5 3.5 3.0   GFR: Estimated Creatinine Clearance: 64.9 mL/min (A) (by C-G formula based on SCr of 1.36 mg/dL (H)). Liver Function Tests: Recent Labs  Lab 02/18/20 0445 02/19/20 0430 02/20/20 0324 02/21/20 0416 02/22/20 0407  AST 530* 274* 139* 85* 101*  ALT 802* 613* 420* 307* 251*  ALKPHOS 107 101 92 92 91  BILITOT 3.2* 3.2* 3.3* 3.3* 4.4*  PROT 5.9* 5.8* 5.9* 6.0* 6.5  ALBUMIN 2.4* 2.3* 2.3* 2.5* 2.8*   No results for input(s): LIPASE, AMYLASE in the last 168 hours. No results for input(s): AMMONIA in the last 168 hours. Coagulation Profile: No results for input(s): INR, PROTIME in the last 168 hours. Cardiac Enzymes: No results for input(s): CKTOTAL, CKMB, CKMBINDEX, TROPONINI in the last 168 hours. BNP (last 3 results) No results for input(s): PROBNP in the last 8760 hours. HbA1C: No results for input(s): HGBA1C in the last 72 hours. CBG: Recent Labs  Lab 02/23/20 0738 02/23/20 1143 02/23/20 1636 02/24/20 0811 02/24/20 1200  GLUCAP 81 111* 102* 115* 161*   Lipid Profile: Recent Labs    02/23/20 0443  TRIG 152*   Thyroid Function Tests: No results for input(s): TSH, T4TOTAL, FREET4, T3FREE, THYROIDAB in the last 72 hours. Anemia Panel: No results for input(s): VITAMINB12, FOLATE, FERRITIN, TIBC, IRON, RETICCTPCT in the last 72 hours. Sepsis Labs: Recent Labs  Lab 02/20/20 0324 02/21/20 0416  PROCALCITON 0.97 0.65    Recent Results  (from the past 240 hour(s))  Culture, respiratory     Status: None   Collection Time: 02/15/20  9:46 AM   Specimen: Tracheal Aspirate  Result Value Ref Range Status   Specimen Description   Final    TRACHEAL ASPIRATE Performed at [facility], [address], [place], [place]    Special Requests   Final    NONE Performed at [facility], [address] [place], [place]    Gram Stain   Final    ABUNDANT WBC PRESENT,BOTH PMN AND MONONUCLEAR FEW GRAM NEGATIVE RODS FEW GRAM POSITIVE COCCI RARE YEAST    Culture   Final    Consistent with normal respiratory flora. Performed at [facility], [address] [place], [place] [address]    Report Status 02/17/2020 FINAL  Final  Culture, respiratory (non-expectorated)     Status: None   Collection Time: 02/20/20 12:18 PM   Specimen: Tracheal Aspirate; Respiratory  Result Value Ref Range Status   Specimen Description   Final    TRACHEAL ASPIRATE Performed at [facility], [address] [place], [place] [address]    Special Requests   Final    NONE Performed at [facility], [address] [place], [place] [address]    Gram Stain   Final    MODERATE WBC PRESENT, PREDOMINANTLY PMN NO ORGANISMS SEEN Performed at [facility], [place] [address] [place], [place] [address]    Culture RARE CANDIDA ALBICANS  Final   Report Status 02/23/2020 FINAL  Final      Radiology Studies: No results found.   Scheduled Meds: . apixaban  10 mg Oral BID   Followed by  . [START ON 03/02/2020] apixaban  5 mg Oral BID  . atorvastatin  40 mg Oral Q2200  . chlorhexidine  15 mL Mouth Rinse BID  . Chlorhexidine Gluconate Cloth  6 each Topical Daily  . feeding supplement (ENSURE ENLIVE)  237 mL Oral TID BM  . folic acid  1 mg Oral Daily  . furosemide  40 mg Intravenous Daily  . isosorbide-hydrALAZINE  1 tablet Oral TID  . lisinopril  2.5 mg Oral Daily  . mouth rinse  15 mL  Mouth Rinse q12n4p  . metoprolol tartrate  12.5 mg Oral BID  . multivitamin with minerals  1 tablet Per Tube Daily  . pantoprazole  40 mg Oral Daily  . [START ON 02/25/2020] predniSONE  20 mg Oral Q breakfast   Followed by  . [START ON 02/26/2020] predniSONE  10 mg Oral Q breakfast  . sodium chloride flush  10-40 mL Intracatheter Q12H  . spironolactone  12.5 mg Oral Daily  . thiamine  100 mg Per Tube Daily   Continuous Infusions:    LOS: 11 days     Enzo Bi, MD [facility] If 7PM-7AM, please contact night-coverage 02/24/2020, 3:56 PM

## 2020-02-24 NOTE — Progress Notes (Signed)
Physical Therapy Treatment Patient Details Name: Shawn Taylor MRN: [id number] DOB: 10-01-1956 Today's Date: 02/24/2020    History of Present Illness Pt is a 63 yo male presented to ED in atrial flutter 02/13/20 s/p respiratory arrest followed by cardiac arrest, cardiogenic shock, pulmonary edema, severe metabolic acidosis, BLE DVT's concerning for possible pulmonary embolisms requiring mechanical intubation and levophed. Now extubated 02/21/20. PMH of HTN, migraines.    PT Comments    Pt was long sitting in bed upon arriving. He agrees to PT session and is cooperative and motivated throughout. Pt is A and O x 4 and was able to follow commands throughout with increased time to process. Pt's lunch tray in front of him but untouched. " I'm having trouble swallowing." Therapist to discuss with SLP. Pt agrees to attempt OOB activity. BP at rest in supine 144/94 with HR 89 bpm. Pt required max assist of one to sit up EOB with increased time required and vcs throughout for safety. Pt does require use of bed rails and has fair sitting balance. PT only able to tolerate sitting EOB x ~ 1 minute prior to c/o dizziness and requesting to rerturn to supine. Max assist to return to supine and reposition to [facility]. BP rechecked 154/103. Symptoms resolves with returning to supine. HR elevated to 102bpm with activity but returned to low 90s one supine in bed. Pt was able to tolerate performing there ex in bed but continues to be weak in all extremities. See exercises listed below. RN aware of dizziness. Therapist recommends pt put bed in chair position as often as he can. Overall pt tolerated session well without pain. Will need SNF to address deficits with strength, balance, and overall safe functional mobility.    Follow Up Recommendations  SNF     Equipment Recommendations  Other (comment)(defer to next level of care)    Recommendations for Other Services       Precautions / Restrictions  Precautions Precautions: Fall Restrictions Weight Bearing Restrictions: No    Mobility  Bed Mobility Overal bed mobility: Needs Assistance Bed Mobility: Supine to Sit;Sit to Supine     Supine to sit: Max assist;HOB elevated;+2 for safety/equipment(max assist of one, 2nd person stand by for safety) Sit to supine: Max assist;+2 for safety/equipment   General bed mobility comments: Pt was able to progress from supine(HOB elevated) to short sit EOB with increased time and max assist of one. Pt does require use of bed rails and vcs throughout for technique and sequencing.  Transfers                 General transfer comment: unable to trial 2/2 to pt dizziness with sitting EOB  Ambulation/Gait                 Stairs             Wheelchair Mobility    Modified Rankin (Stroke Patients Only)       Balance                                            Cognition Arousal/Alertness: Awake/alert Behavior During Therapy: WFL for tasks assessed/performed Overall Cognitive Status: Within Functional Limits for tasks assessed  General Comments: Pt is A and O x 4 this date and cooperative and pleasant throughout.       Exercises General Exercises - Lower Extremity Ankle Circles/Pumps: AROM;Both;20 reps Quad Sets: AROM;10 reps;Both Gluteal Sets: 10 reps Heel Slides: AAROM;Strengthening;10 reps;Both Hip ABduction/ADduction: AAROM;Strengthening;10 reps;Both    General Comments        Pertinent Vitals/Pain Pain Assessment: No/denies pain    Home Living                      Prior Function            PT Goals (current goals can now be found in the care plan section) Acute Rehab PT Goals Patient Stated Goal: " I want to get stronger, Ifeel so weak" Progress towards PT goals: Progressing toward goals    Frequency    Min 2X/week      PT Plan Current plan remains appropriate     Co-evaluation              AM-PAC PT "6 Clicks" Mobility   Outcome Measure  Help needed turning from your back to your side while in a flat bed without using bedrails?: A Lot Help needed moving from lying on your back to sitting on the side of a flat bed without using bedrails?: A Lot Help needed moving to and from a bed to a chair (including a wheelchair)?: Total Help needed standing up from a chair using your arms (e.g., wheelchair or bedside chair)?: Total Help needed to walk in hospital room?: Total Help needed climbing 3-5 steps with a railing? : Total 6 Click Score: 8    End of Session Equipment Utilized During Treatment: Gait belt Activity Tolerance: Patient limited by fatigue Patient left: in bed;with call bell/phone within reach;with bed alarm set;Other (comment) Nurse Communication: Mobility status PT Visit Diagnosis: Other abnormalities of gait and mobility (R26.89);Unsteadiness on feet (R26.81);Muscle weakness (generalized) (M62.81)     Time: 1320-1340 PT Time Calculation (min) (ACUTE ONLY): 20 min  Charges:  $Therapeutic Activity: 8-22 mins                     Julaine Fusi PTA 02/24/20, 1:53 PM

## 2020-02-24 NOTE — Progress Notes (Signed)
Ch visited with Pt as per recommendation by Ch.Waters for follow-up. Pt was sitting in bed when Ch walked in. Pt looked tired. Ch introduced self and inquired about Pt. Pt said that he was good. When Ch asked if he was feeling good, Pt replied "if only." Pt seemed dejected and exhausted. Pt let Ch know that he does not want prayer. Ch said that she will let Pt rest, and left after letting Pt know chaplain availability.

## 2020-02-24 NOTE — Progress Notes (Signed)
69cc urine in bladder per bladder scan.

## 2020-02-25 LAB — CBC
HCT: 40.8 % (ref 39.0–52.0)
Hemoglobin: 13.2 g/dL (ref 13.0–17.0)
MCH: 29.8 pg (ref 26.0–34.0)
MCHC: 32.4 g/dL (ref 30.0–36.0)
MCV: 92.1 fL (ref 80.0–100.0)
Platelets: 69 10*3/uL — ABNORMAL LOW (ref 150–400)
RBC: 4.43 MIL/uL (ref 4.22–5.81)
RDW: 14.7 % (ref 11.5–15.5)
WBC: 14.9 10*3/uL — ABNORMAL HIGH (ref 4.0–10.5)
nRBC: 0 % (ref 0.0–0.2)

## 2020-02-25 LAB — GLUCOSE, CAPILLARY
Glucose-Capillary: 122 mg/dL — ABNORMAL HIGH (ref 70–99)
Glucose-Capillary: 128 mg/dL — ABNORMAL HIGH (ref 70–99)
Glucose-Capillary: 108 mg/dL — ABNORMAL HIGH (ref 70–99)

## 2020-02-25 LAB — BASIC METABOLIC PANEL
Anion gap: 10 (ref 5–15)
BUN: 35 mg/dL — ABNORMAL HIGH (ref 8–23)
CO2: 26 mmol/L (ref 22–32)
Calcium: 8.6 mg/dL — ABNORMAL LOW (ref 8.9–10.3)
Chloride: 105 mmol/L (ref 98–111)
Creatinine, Ser: 1.22 mg/dL (ref 0.61–1.24)
GFR calc Af Amer: >60 mL/min (ref >60)
GFR calc non Af Amer: >60 mL/min (ref >60)
Glucose, Bld: 126 mg/dL — ABNORMAL HIGH (ref 70–99)
Potassium: 3.3 mmol/L — ABNORMAL LOW (ref 3.5–5.1)
Sodium: 141 mmol/L (ref 135–145)

## 2020-02-25 LAB — PHOSPHORUS: Phosphorus: 2.8 mg/dL (ref 2.5–4.6)

## 2020-02-25 LAB — MAGNESIUM: Magnesium: 1.9 mg/dL (ref 1.7–2.4)

## 2020-02-25 MED ORDER — CALCIUM POLYCARBOPHIL 625 MG PO TABS
625.0000 mg | ORAL_TABLET | Freq: Every day | ORAL | Status: DC
  Administered 2020-02-26 – 2020-02-29 (×4): 625 mg via ORAL
  Filled 2020-02-25 (×5): qty 1

## 2020-02-25 MED ORDER — POTASSIUM CHLORIDE CRYS ER 20 MEQ PO TBCR
40.0000 meq | EXTENDED_RELEASE_TABLET | Freq: Once | ORAL | Status: AC
  Administered 2020-02-25: 40 meq via ORAL
  Filled 2020-02-25: qty 2

## 2020-02-25 NOTE — Progress Notes (Signed)
Speech Language Pathology Treatment: Dysphagia  Patient Details Name: Shawn Taylor MRN: [id number] DOB: [birth year] Today's Date: 02/25/2020 Time: 8786-7672 SLP Time Calculation (min) (ACUTE ONLY): 30 min  Assessment / Plan / Recommendation Clinical Impression  Pt seen today for ongoing assessment of swallowing; toleration of diet. Family present in room w/ pt. Pt continues to c/o Esophageal phase dysmotility and discomfort moreso w/ Solid foods - he endorsed this was ongoing at home Prior to this admission ("meats give me trouble").  Pt positioned upright in bed and given po trials. He consumed and appeared to adequately tolerat sips of thins via cup/straw and bites of purees, but continues to endorse c/o Esophageal phase dysmotility and discomfort w/ more solid foods. No decline in vocal quality or respiratory effort (from his baseline effort) during/post trials. Oral phase appeared [facility] for bolus management and clearing. Would suspect Esophageal phase dysmotility as the main c/o difficulty is w/ solid foods, and need for GI to consult for assessment next to best determine any deficits. Any regurgitation of food/liquid could put him at risk for aspiration of Reflux material and/or lack of nutrition in general. He was encouraged to continue to drink the Ensure (as seen in room); another was presented to him as he was not eating his Lunch meal.  ST services will continue to f/u w/ toleration of diet; education on general aspiration and Reflux precautions. Pt and family member given education on above and agreed. MD/NSG updated.     HPI HPI: Pt is a 64 yo male w/ PMH of HTN, migraines and polysubstance abuse who admitted s/p respiratory arrest followed by cardiac arrest, cardiogenic shock, pulmonary edema, severe metabolic acidosis, BLE DVT's concerning for possible pulmonary embolisms requiring mechanical intubation and levophed gtt. he was admitted on 3/22 with acute hypoxic respiratory failure  secondary to metabolic acidosis and pulmonary edema.  Found with a flutter RVR.  While bed availability in stepdown unit was pending patient had respiratory arrest which led to cardiac arrest and he was mechanically intubated and admitted to the ICU.  An echocardiogram revealed severe systolic dysfunction.  He is status post extubation on BiPAP.  He had multiorgan failure.  Echo with EF less than 20%.  With bilateral occlusive DVT of lower extremity.  He has significant thrombocytopenia post heparin infusion.  Patient was switched to argatroban with pharmacy consult.  His BNP is trending down.  Tolerating Lasix.  Transferred to the hospitalist service on 02/23/2020.  Latest chest X-ray, 02/22/2020, Persistent congestive heart failure pattern. Slightly better aeration of the lung bases.  Medications include pantoprazole.  Pt endorses c/o Esophageal phase dysmotility ongoing at home prior to this Admission.       SLP Plan  Continue with current plan of care       Recommendations  Diet recommendations: Dysphagia 3 (mechanical soft);Thin liquid(minced meats w/ gravies) Liquids provided via: Cup;Straw Medication Administration: Whole meds with puree(for safer swallowing) Supervision: Patient able to self feed;Staff to assist with self feeding;Intermittent supervision to cue for compensatory strategies Compensations: Minimize environmental distractions;Slow rate;Small sips/bites;Lingual sweep for clearance of pocketing;Multiple dry swallows after each bite/sip;Follow solids with liquid Postural Changes and/or Swallow Maneuvers: Seated upright 90 degrees;Upright 30-60 min after meal(Reflux precautions)                Oral Care Recommendations: Oral care BID;Oral care before and after PO;Staff/trained caregiver to provide oral care Follow up Recommendations: (TBD) SLP Visit Diagnosis: Dysphagia, pharyngoesophageal phase (R13.14)(Esophageal phase dysmotility) Plan: Continue with current plan of  care       GO                 Jerilynn Som, MS, CCC-SLP Shawn Taylor 02/25/2020, 12:59 PM

## 2020-02-25 NOTE — Plan of Care (Signed)
All morning meds crushed and placed in apple sauce to administer, but patient refused to open mouth for even 1 bite.  Meds re-charted as not given and Dr. Informed.

## 2020-02-25 NOTE — Progress Notes (Signed)
PROGRESS NOTE    Shawn Taylor  VFI:[id number] DOB: 03-22-1956 DOA: 02/13/2020 PCP: Patient, No Pcp Per    Assessment & Plan:   Active Problems:   Atrial flutter (HCC)   Cardiac arrest, cause unspecified (HCC)   Liver failure (HCC)   Acute hypoxemic respiratory failure (HCC)    Shawn Taylor is a 64 yo AA male admitted s/p respiratory arrest followed by cardiac arrest, cardiogenic shock, pulmonary edema, severe metabolic acidosis, BLE DVT'sconcerning for possible pulmonary embolismsrequiring mechanical intubation and levophed gtt. he was admitted on 3/22 with acute hypoxic respiratory failure secondary to metabolic acidosis and pulmonary edema.  Found with a flutter RVR.  While bed availability in stepdown unit was pending patient had respiratory arrest which led to cardiac arrest and he was mechanically intubated and admitted to the ICU.  An echocardiogram revealed severe systolic dysfunction.  He is status post extubation on BiPAP.  He had multiorgan failure.  Echo with EF less than 20%.  With bilateral occlusive DVT of lower extremity.  He has significant thrombocytopenia post heparin infusion.  Patient was switched to argatroban with pharmacy consult.  His BNP is trending down.  Tolerating Lasix.  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 - Transition to Eliquis on 4/2.  Dysphagia --pt reported difficult swallowing solids this morning, but not  liquids. --SLP eval found no overt aspiration --change to Dys 1 diet for now  Discontinue Foley on 4/1, no issue with retention.  Rectal tube in for watery stool --start fibercon for stool bulking   DVT prophylaxis: [id number] Code Status: Full code  Palliative care following Family Communication:  Disposition Plan: SNF rehab, though pt has no insurance, unknown when will have a bed.   Subjective and Interval History:  Pt had no complaints, but was not engaged today and appeared lethargic.  Nursing noted pt refusing morning meds.  SLP eval didn't find overt aspiration risk.  No fever, N/V/D.      Objective: Vitals:   02/25/20 0503 02/25/20 0815 02/25/20 1016 02/25/20 1218  BP: (!) 154/92 (!) 152/100 (!) 148/95 (!) 163/101  Pulse: 86 86 88 89  Resp:  18  18  Temp: 98.3 F (36.8 C)   98.7 F (37.1 C)  TempSrc: Oral     SpO2: 100% 99%  98%  Weight: 77.1 kg     Height:        Intake/Output Summary (Last 24 hours) at 02/25/2020 1246 Last data filed at 02/25/2020 0506 Gross per 24 hour  Intake 1155 ml  Output 2150 ml  Net -995 ml   Filed Weights   02/23/20 0703 02/24/20 0500 02/25/20 0503  Weight: 91.4 kg 93.2 kg 77.1 kg    Examination:   Constitutional: NAD, lethargic HEENT: conjunctivae icterus, lids normal, EOMI CV: RRR no M,R,G. Distal pulses +2.  No cyanosis.   RESP: CTA B/L, normal respiratory effort, on 2L GI: +BS, NTND Extremities: 1-2+ Pitting edema in BLE up to knees, improved from prior SKIN: warm,  dry and intact Neuro: II - XII grossly intact.  Sensation intact   Data Reviewed: I have personally reviewed following labs and imaging studies  CBC: Recent Labs  Lab 02/19/20 0430 02/19/20 0430 02/20/20 0324 02/20/20 0324 02/21/20 0416 02/22/20 0407 02/23/20 0121 02/24/20 0907 02/25/20 0452  WBC 8.7   < > 8.3   < > 9.5 11.6* 11.8* 17.0* 14.9*  NEUTROABS 7.3  --  7.0  --  8.2* 10.1* 9.8*  --   --   HGB 13.8   < > 13.9   < > 14.4 13.9 14.1 13.3  13.2  HCT 41.4   < > 41.9   < > 44.6 44.3 45.0 42.1 40.8  MCV 90.6   < > 90.9   < > 92.5 94.5 94.3 93.6 92.1  PLT 88*   < > 77*   < > 69* 58* 58* 67* 69*   < > = values in this interval not displayed.   Basic Metabolic Panel: Recent Labs  Lab 02/21/20 0416 02/22/20 0407 02/23/20 0121 02/24/20 0907 02/25/20 0452  NA 144 149* 146* 141 141  K 4.0 3.5 3.3* 3.7 3.3*  CL 102 106 103 103 105  CO2 29 30 33* 31 26  GLUCOSE 182* 116* 100* 113* 126*  BUN 61* 64* 52* 38* 35*  CREATININE 2.26* 1.87* 1.52* 1.36* 1.22  CALCIUM 8.7* 9.1 9.0 8.6* 8.6*  MG 1.9 2.0 1.8 1.8 1.9  PHOS 5.3* 4.5 3.5 3.0 2.8   GFR: Estimated Creatinine Clearance: 66 mL/min (by C-G formula based on SCr of 1.22 mg/dL). Liver Function Tests: Recent Labs  Lab 02/19/20 0430 02/20/20 0324 02/21/20 0416 02/22/20 0407  AST 274* 139* 85* 101*  ALT 613* 420* 307* 251*  ALKPHOS 101 92 92 91  BILITOT 3.2* 3.3* 3.3* 4.4*  PROT 5.8* 5.9* 6.0* 6.5  ALBUMIN 2.3* 2.3* 2.5* 2.8*   No results for input(s): LIPASE, AMYLASE in the last 168 hours. No results for input(s): AMMONIA in the last 168 hours. Coagulation Profile: No results for input(s): INR, PROTIME in the last 168 hours. Cardiac Enzymes: No results for input(s): CKTOTAL, CKMB, CKMBINDEX, TROPONINI in the last 168 hours. BNP (last 3 results) No results for input(s): PROBNP in the last 8760 hours. HbA1C: No results for input(s): HGBA1C in the last 72 hours. CBG: Recent Labs  Lab 02/24/20 0811 02/24/20 1200 02/24/20 1656 02/25/20 0817 02/25/20 1219  GLUCAP 115* 161* 165* 122* 128*   Lipid Profile: Recent Labs    02/23/20 0443  TRIG 152*   Thyroid Function Tests: No results for input(s): TSH, T4TOTAL, FREET4, T3FREE, THYROIDAB in the last 72 hours. Anemia Panel: No results for input(s): VITAMINB12, FOLATE, FERRITIN, TIBC, IRON, RETICCTPCT in the last 72 hours. Sepsis Labs: Recent Labs  Lab 02/20/20 0324 02/21/20 0416  PROCALCITON 0.97 0.65     Recent Results (from the past 240 hour(s))  Culture, respiratory (non-expectorated)     Status: None   Collection Time: 02/20/20 12:18 PM   Specimen: Tracheal Aspirate; Respiratory  Result Value Ref Range Status   Specimen Description   Final    TRACHEAL ASPIRATE Performed at [facility], [address], [place], [place] [address]    Special Requests   Final    NONE Performed at [facility], [address] [place], [place] [address]    Gram Stain   Final    MODERATE WBC PRESENT, PREDOMINANTLY PMN NO ORGANISMS SEEN Performed at [facility], [address], [place], [place] [address]    Culture RARE CANDIDA ALBICANS  Final   Report Status 02/23/2020 FINAL  Final      Radiology Studies: No results found.   Scheduled Meds: . apixaban  10 mg Oral BID   Followed by  . [START ON 03/02/2020] apixaban  5 mg Oral BID  . atorvastatin  40 mg Oral Q2200  . chlorhexidine  15 mL Mouth Rinse BID  . Chlorhexidine Gluconate Cloth  6 each Topical Daily  . feeding supplement (ENSURE ENLIVE)  237 mL Oral TID BM  . folic acid  1 mg Oral Daily  . furosemide  40 mg Intravenous Daily  . isosorbide-hydrALAZINE  1 tablet Oral TID  . lisinopril  2.5 mg Oral Daily  . mouth rinse  15 mL Mouth Rinse q12n4p  . metoprolol tartrate  12.5 mg Oral BID  . multivitamin with minerals  1 tablet Per Tube Daily  . pantoprazole  40 mg Oral Daily  . polycarbophil  625 mg Oral Daily  . predniSONE  20 mg Oral Q breakfast   Followed by  . [START ON 02/26/2020] predniSONE  10 mg Oral Q breakfast  . sodium chloride flush  10-40 mL Intracatheter Q12H  . spironolactone  12.5 mg Oral Daily  . thiamine  100 mg Per Tube Daily   Continuous Infusions:    LOS: 12 days     Enzo Bi, MD [facility] If 7PM-7AM, please contact night-coverage 02/25/2020, 12:46 PM

## 2020-02-26 DIAGNOSIS — F1410 Cocaine abuse, uncomplicated: Secondary | ICD-10-CM | POA: Diagnosis present

## 2020-02-26 DIAGNOSIS — I82403 Acute embolism and thrombosis of unspecified deep veins of lower extremity, bilateral: Secondary | ICD-10-CM | POA: Diagnosis present

## 2020-02-26 DIAGNOSIS — N179 Acute kidney failure, unspecified: Secondary | ICD-10-CM | POA: Diagnosis present

## 2020-02-26 DIAGNOSIS — R1310 Dysphagia, unspecified: Secondary | ICD-10-CM

## 2020-02-26 DIAGNOSIS — J811 Chronic pulmonary edema: Secondary | ICD-10-CM | POA: Diagnosis present

## 2020-02-26 DIAGNOSIS — F1010 Alcohol abuse, uncomplicated: Secondary | ICD-10-CM | POA: Diagnosis present

## 2020-02-26 DIAGNOSIS — D696 Thrombocytopenia, unspecified: Secondary | ICD-10-CM | POA: Diagnosis not present

## 2020-02-26 DIAGNOSIS — I5021 Acute systolic (congestive) heart failure: Secondary | ICD-10-CM | POA: Diagnosis present

## 2020-02-26 DIAGNOSIS — E871 Hypo-osmolality and hyponatremia: Secondary | ICD-10-CM | POA: Diagnosis present

## 2020-02-26 LAB — BASIC METABOLIC PANEL
Anion gap: 9 (ref 5–15)
BUN: 32 mg/dL — ABNORMAL HIGH (ref 8–23)
CO2: 28 mmol/L (ref 22–32)
Calcium: 8.6 mg/dL — ABNORMAL LOW (ref 8.9–10.3)
Chloride: 108 mmol/L (ref 98–111)
Creatinine, Ser: 1.27 mg/dL — ABNORMAL HIGH (ref 0.61–1.24)
GFR calc Af Amer: >60 mL/min (ref >60)
GFR calc non Af Amer: 60 mL/min — ABNORMAL LOW (ref >60)
Glucose, Bld: 111 mg/dL — ABNORMAL HIGH (ref 70–99)
Potassium: 3.1 mmol/L — ABNORMAL LOW (ref 3.5–5.1)
Sodium: 145 mmol/L (ref 135–145)

## 2020-02-26 LAB — CBC
HCT: 41.8 % (ref 39.0–52.0)
Hemoglobin: 13.6 g/dL (ref 13.0–17.0)
MCH: 29.4 pg (ref 26.0–34.0)
MCHC: 32.5 g/dL (ref 30.0–36.0)
MCV: 90.5 fL (ref 80.0–100.0)
Platelets: 67 10*3/uL — ABNORMAL LOW (ref 150–400)
RBC: 4.62 MIL/uL (ref 4.22–5.81)
RDW: 14.9 % (ref 11.5–15.5)
WBC: 12.4 10*3/uL — ABNORMAL HIGH (ref 4.0–10.5)
nRBC: 0 % (ref 0.0–0.2)

## 2020-02-26 LAB — GLUCOSE, CAPILLARY
Glucose-Capillary: 124 mg/dL — ABNORMAL HIGH (ref 70–99)
Glucose-Capillary: 242 mg/dL — ABNORMAL HIGH (ref 70–99)
Glucose-Capillary: 150 mg/dL — ABNORMAL HIGH (ref 70–99)

## 2020-02-26 LAB — PHOSPHORUS: Phosphorus: 3.3 mg/dL (ref 2.5–4.6)

## 2020-02-26 LAB — MAGNESIUM: Magnesium: 1.6 mg/dL — ABNORMAL LOW (ref 1.7–2.4)

## 2020-02-26 MED ORDER — SODIUM CHLORIDE 0.9% FLUSH: 10.0000 mL | INTRAVENOUS | Status: DC | PRN

## 2020-02-26 MED ORDER — MAGNESIUM SULFATE 2 GM/50ML IV SOLN
2.0000 g | Freq: Once | INTRAVENOUS | Status: AC
  Administered 2020-02-26: 2 g via INTRAVENOUS
  Filled 2020-02-26: qty 50

## 2020-02-26 MED ORDER — FUROSEMIDE 40 MG PO TABS
40.0000 mg | ORAL_TABLET | Freq: Every day | ORAL | Status: DC
  Administered 2020-02-26: 09:00:00 40 mg via ORAL
  Filled 2020-02-26: qty 1

## 2020-02-26 MED ORDER — SODIUM CHLORIDE 0.9% FLUSH
10.0000 mL | Freq: Two times a day (BID) | INTRAVENOUS | Status: DC
  Administered 2020-02-26 – 2020-02-28 (×7): 10 mL

## 2020-02-26 MED ORDER — POTASSIUM CHLORIDE CRYS ER 20 MEQ PO TBCR
40.0000 meq | EXTENDED_RELEASE_TABLET | Freq: Once | ORAL | Status: AC
  Administered 2020-02-26: 40 meq via ORAL
  Filled 2020-02-26: qty 2

## 2020-02-26 NOTE — Progress Notes (Signed)
PROGRESS NOTE    Shawn Taylor  ONG:[id number] DOB: 1956/04/16 DOA: 02/13/2020 PCP: Patient, No Pcp Per    Assessment & Plan:   Active Problems:   Atrial flutter (HCC)   Cardiac arrest, cause unspecified (HCC)   Liver failure (HCC)   Acute hypoxemic respiratory failure (HCC)    Shawn Taylor is a 64 yo AA male admitted s/p respiratory arrest followed by cardiac arrest, cardiogenic shock, pulmonary edema, severe metabolic acidosis, BLE DVT'sconcerning for possible pulmonary embolismsrequiring mechanical intubation and levophed gtt. he was admitted on 3/22 with acute hypoxic respiratory failure secondary to metabolic acidosis and pulmonary edema.  Found with a flutter RVR.  While bed availability in stepdown unit was pending patient had respiratory arrest which led to cardiac arrest and he was mechanically intubated and admitted to the ICU.  An echocardiogram revealed severe systolic dysfunction.  He is status post extubation on BiPAP.  He had multiorgan failure.  Echo with EF less than 20%.  With bilateral occlusive DVT of lower extremity.  He has significant thrombocytopenia post heparin infusion.  Patient was switched to argatroban with pharmacy consult.  His BNP is trending down.  Tolerating Lasix.  Transferred to the hospitalist service on 02/23/2020   Acute Hypoxic Respiratory Failure -possibly due to flash pulmonary edema vs PE  -continue Bronchodilator Therapy -Wean O2 as able, currently on 1L  Acute on chronic systolic CHF with EF<20% -background of accelerated Atrial fultter  -cardiology on case - appreciate input  - BNP >900 trending to 500 --Has been on IV lasix 40 mg daily PLAN: --Transition to PO Lasix 40 mg daily --Strict I/O  Bilateral occlusive DVT of lower extermities  - significant thrombocytopenia post Heparin infusion, switched to argatroban  -PF4 and Serotonin release in process, however, HIT abx neg. - Transition to Eliquis on 4/2. --continue  Eliquis  Dysphagia --pt reported difficult swallowing solids this morning, but not liquids. --SLP eval found no overt aspiration --Dys 1 diet for now  Discontinue Foley on 4/1, no issue with retention.  Rectal tube in for watery stool --continue fibercon for stool bulking   DVT prophylaxis: [id number] Code Status: Full code  Palliative care following Family Communication:  Disposition Plan: SNF rehab, though pt has no insurance, unknown when will have a bed.   Subjective and Interval History:  Pt still didn't want to eat solids, but drank all his Ensures.  Spent the whole time complaining about a new IV in his left forearm and insisted that it be taken out.  No noted fever, N/V.       Objective: Vitals:   02/25/20 2029 02/26/20 0512 02/26/20 0823 02/26/20 1224  BP: (!) 168/95 (!) 157/98 (!) 157/94 135/83  Pulse: 81 90 88 91  Resp: 20 18 18 18   Temp: 98.9 F (37.2 C) 98.3 F (36.8 C) 98.5 F (36.9 C) 98.4 F (36.9 C)  TempSrc: Oral Oral Oral   SpO2: 97% 100% 99% 94%  Weight:  63.9 kg    Height:        Intake/Output Summary (Last 24 hours) at 02/26/2020 1453 Last data filed at 02/26/2020 1120 Gross per 24 hour  Intake --  Output 2200 ml  Net -2200 ml   Filed Weights   02/24/20 0500 02/25/20 0503 02/26/20 0512  Weight: 93.2 kg 77.1 kg 63.9 kg    Examination:   Constitutional: NAD, alert HEENT: conjunctivae icterus, lids normal, EOMI CV: RRR no M,R,G. Distal pulses +2.  No cyanosis.   RESP: CTA B/L,  normal respiratory effort, on 1L GI: +BS, NTND Extremities: edema much improved in BLE SKIN: warm, dry and intact Neuro: II - XII grossly intact.  Sensation intact   Data Reviewed: I have personally reviewed following labs and imaging studies  CBC: Recent Labs  Lab 02/20/20 0324 02/20/20 0324 02/21/20 0416 02/21/20 0416 02/22/20 0407 02/23/20 0121 02/24/20 0907 02/25/20 0452 02/26/20 0420  WBC 8.3   < > 9.5   < > 11.6* 11.8* 17.0* 14.9* 12.4*   NEUTROABS 7.0  --  8.2*  --  10.1* 9.8*  --   --   --   HGB 13.9   < > 14.4   < > 13.9 14.1 13.3 13.2 13.6  HCT 41.9   < > 44.6   < > 44.3 45.0 42.1 40.8 41.8  MCV 90.9   < > 92.5   < > 94.5 94.3 93.6 92.1 90.5  PLT 77*   < > 69*   < > 58* 58* 67* 69* 67*   < > = values in this interval not displayed.   Basic Metabolic Panel: Recent Labs  Lab 02/22/20 0407 02/23/20 0121 02/24/20 0907 02/25/20 0452 02/26/20 0420  NA 149* 146* 141 141 145  K 3.5 3.3* 3.7 3.3* 3.1*  CL 106 103 103 105 108  CO2 30 33* 31 26 28   GLUCOSE 116* 100* 113* 126* 111*  BUN 64* 52* 38* 35* 32*  CREATININE 1.87* 1.52* 1.36* 1.22 1.27*  CALCIUM 9.1 9.0 8.6* 8.6* 8.6*  MG 2.0 1.8 1.8 1.9 1.6*  PHOS 4.5 3.5 3.0 2.8 3.3   GFR: Estimated Creatinine Clearance: 53.8 mL/min (A) (by C-G formula based on SCr of 1.27 mg/dL (H)). Liver Function Tests: Recent Labs  Lab 02/20/20 0324 02/21/20 0416 02/22/20 0407  AST 139* 85* 101*  ALT 420* 307* 251*  ALKPHOS 92 92 91  BILITOT 3.3* 3.3* 4.4*  PROT 5.9* 6.0* 6.5  ALBUMIN 2.3* 2.5* 2.8*   No results for input(s): LIPASE, AMYLASE in the last 168 hours. No results for input(s): AMMONIA in the last 168 hours. Coagulation Profile: No results for input(s): INR, PROTIME in the last 168 hours. Cardiac Enzymes: No results for input(s): CKTOTAL, CKMB, CKMBINDEX, TROPONINI in the last 168 hours. BNP (last 3 results) No results for input(s): PROBNP in the last 8760 hours. HbA1C: No results for input(s): HGBA1C in the last 72 hours. CBG: Recent Labs  Lab 02/25/20 0817 02/25/20 1219 02/25/20 1608 02/26/20 0822 02/26/20 1226  GLUCAP 122* 128* 108* 124* 242*   Lipid Profile: No results for input(s): CHOL, HDL, LDLCALC, TRIG, CHOLHDL, LDLDIRECT in the last 72 hours. Thyroid Function Tests: No results for input(s): TSH, T4TOTAL, FREET4, T3FREE, THYROIDAB in the last 72 hours. Anemia Panel: No results for input(s): VITAMINB12, FOLATE, FERRITIN, TIBC, IRON,  RETICCTPCT in the last 72 hours. Sepsis Labs: Recent Labs  Lab 02/20/20 0324 02/21/20 0416  PROCALCITON 0.97 0.65    Recent Results (from the past 240 hour(s))  Culture, respiratory (non-expectorated)     Status: None   Collection Time: 02/20/20 12:18 PM   Specimen: Tracheal Aspirate; Respiratory  Result Value Ref Range Status   Specimen Description   Final    TRACHEAL ASPIRATE Performed at [facility], [address], [place], [place]    Special Requests   Final    NONE Performed at [facility], [address] [place], [place]    Gram Stain   Final    MODERATE WBC  PRESENT, PREDOMINANTLY PMN NO ORGANISMS SEEN Performed at [facility], [place] [address], [place],  [address]    Culture RARE CANDIDA ALBICANS  Final   Report Status 02/23/2020 FINAL  Final      Radiology Studies: No results found.   Scheduled Meds: . apixaban  10 mg Oral BID   Followed by  . [START ON 03/02/2020] apixaban  5 mg Oral BID  . atorvastatin  40 mg Oral Q2200  . chlorhexidine  15 mL Mouth Rinse BID  . Chlorhexidine Gluconate Cloth  6 each Topical Daily  . feeding supplement (ENSURE ENLIVE)  237 mL Oral TID BM  . folic acid  1 mg Oral Daily  . furosemide  40 mg Oral Daily  . isosorbide-hydrALAZINE  1 tablet Oral TID  . lisinopril  2.5 mg Oral Daily  . mouth rinse  15 mL Mouth Rinse q12n4p  . metoprolol tartrate  12.5 mg Oral BID  . multivitamin with minerals  1 tablet Per Tube Daily  . pantoprazole  40 mg Oral Daily  . polycarbophil  625 mg Oral Daily  . predniSONE  10 mg Oral Q breakfast  . sodium chloride flush  10-40 mL Intracatheter Q12H  . spironolactone  12.5 mg Oral Daily  . thiamine  100 mg Per Tube Daily   Continuous Infusions:    LOS: 13 days     Enzo Bi, MD [facility] If 7PM-7AM, please contact night-coverage 02/26/2020, 2:53 PM

## 2020-02-26 NOTE — Progress Notes (Signed)
Jugular cvc removed, pt tolerated well. Tip intact. vaseline gauze, gauze and Tegaderm intact. Peripheral line to left arm.

## 2020-02-27 ENCOUNTER — Inpatient Hospital Stay: Payer: Medicaid Other

## 2020-02-27 LAB — SEROTONIN RELEASE ASSAY (SRA)
SRA .2 IU/mL UFH Ser-aCnc: 4 % (ref 0–20)
SRA 100IU/mL UFH Ser-aCnc: <1 % (ref 0–20)

## 2020-02-27 LAB — CBC
HCT: 41.7 % (ref 39.0–52.0)
Hemoglobin: 13.6 g/dL (ref 13.0–17.0)
MCH: 29.5 pg (ref 26.0–34.0)
MCHC: 32.6 g/dL (ref 30.0–36.0)
MCV: 90.5 fL (ref 80.0–100.0)
Platelets: 65 10*3/uL — ABNORMAL LOW (ref 150–400)
RBC: 4.61 MIL/uL (ref 4.22–5.81)
RDW: 14.9 % (ref 11.5–15.5)
WBC: 13.9 10*3/uL — ABNORMAL HIGH (ref 4.0–10.5)
nRBC: 0 % (ref 0.0–0.2)

## 2020-02-27 LAB — BLOOD GAS, ARTERIAL
Acid-Base Excess: 2.8 mmol/L — ABNORMAL HIGH (ref 0.0–2.0)
Bicarbonate: 26.1 mmol/L (ref 20.0–28.0)
FIO2: 0.28
O2 Saturation: 93.1 %
Patient temperature: 37
pCO2 arterial: 35 mmHg (ref 32.0–48.0)
pH, Arterial: 7.48 — ABNORMAL HIGH (ref 7.350–7.450)
pO2, Arterial: 62 mmHg — ABNORMAL LOW (ref 83.0–108.0)

## 2020-02-27 LAB — MAGNESIUM: Magnesium: 1.8 mg/dL (ref 1.7–2.4)

## 2020-02-27 LAB — BASIC METABOLIC PANEL
Anion gap: 9 (ref 5–15)
BUN: 25 mg/dL — ABNORMAL HIGH (ref 8–23)
CO2: 26 mmol/L (ref 22–32)
Calcium: 8.3 mg/dL — ABNORMAL LOW (ref 8.9–10.3)
Chloride: 105 mmol/L (ref 98–111)
Creatinine, Ser: 1.02 mg/dL (ref 0.61–1.24)
GFR calc Af Amer: >60 mL/min (ref >60)
GFR calc non Af Amer: >60 mL/min (ref >60)
Glucose, Bld: 131 mg/dL — ABNORMAL HIGH (ref 70–99)
Potassium: 3.7 mmol/L (ref 3.5–5.1)
Sodium: 140 mmol/L (ref 135–145)

## 2020-02-27 LAB — PHOSPHORUS: Phosphorus: 2.1 mg/dL — ABNORMAL LOW (ref 2.5–4.6)

## 2020-02-27 LAB — TROPONIN I (HIGH SENSITIVITY)
Troponin I (High Sensitivity): 47 ng/L — ABNORMAL HIGH (ref <18)
Troponin I (High Sensitivity): 48 ng/L — ABNORMAL HIGH (ref <18)

## 2020-02-27 MED ORDER — QUETIAPINE FUMARATE 25 MG PO TABS
25.0000 mg | ORAL_TABLET | Freq: Once | ORAL | Status: AC
  Administered 2020-02-27: 14:00:00 25 mg via ORAL
  Filled 2020-02-27: qty 1

## 2020-02-27 MED ORDER — QUETIAPINE FUMARATE 25 MG PO TABS
100.0000 mg | ORAL_TABLET | Freq: Every day | ORAL | Status: DC
  Administered 2020-02-27 – 2020-03-06 (×9): 100 mg via ORAL
  Filled 2020-02-27 (×9): qty 4

## 2020-02-27 MED ORDER — LORAZEPAM 2 MG/ML IJ SOLN
1.0000 mg | Freq: Once | INTRAMUSCULAR | Status: AC
  Administered 2020-02-27: 1 mg via INTRAVENOUS
  Filled 2020-02-27: qty 1

## 2020-02-27 MED ORDER — HALOPERIDOL LACTATE 5 MG/ML IJ SOLN
5.0000 mg | Freq: Four times a day (QID) | INTRAMUSCULAR | Status: DC | PRN
  Administered 2020-02-28: 5 mg via INTRAVENOUS
  Filled 2020-02-27: qty 1

## 2020-02-27 MED ORDER — HALOPERIDOL LACTATE 5 MG/ML IJ SOLN
5.0000 mg | Freq: Once | INTRAMUSCULAR | Status: AC
  Administered 2020-02-27: 16:00:00 5 mg via INTRAVENOUS
  Filled 2020-02-27: qty 1

## 2020-02-27 MED ORDER — FUROSEMIDE 10 MG/ML IJ SOLN
40.0000 mg | Freq: Once | INTRAMUSCULAR | Status: AC
  Administered 2020-02-27: 40 mg via INTRAVENOUS
  Filled 2020-02-27: qty 4

## 2020-02-27 MED ORDER — K PHOS MONO-SOD PHOS DI & MONO 155-852-130 MG PO TABS
500.0000 mg | ORAL_TABLET | ORAL | Status: AC
  Administered 2020-02-27: 500 mg via ORAL
  Filled 2020-02-27 (×2): qty 2

## 2020-02-27 MED ORDER — FUROSEMIDE 40 MG PO TABS
40.0000 mg | ORAL_TABLET | Freq: Two times a day (BID) | ORAL | Status: DC
  Administered 2020-02-27: 14:00:00 40 mg via ORAL
  Filled 2020-02-27: qty 1

## 2020-02-27 NOTE — Progress Notes (Signed)
PROGRESS NOTE    Shawn Taylor  YTK:[id number] DOB: [birth year] DOA: 02/13/2020 PCP: Patient, No Pcp Per    Assessment & Plan:   Principal Problem:   Cardiac arrest, cause unspecified (HCC) Active Problems:   Atrial flutter (HCC)   Liver failure (HCC)   Acute hypoxemic respiratory failure (HCC)   Acute systolic CHF (congestive heart failure) (HCC)   DVT of lower extremity, bilateral (HCC)   Dysphagia   Thrombocytopenia (HCC)   Pulmonary edema   Cocaine abuse (HCC)   Alcohol abuse   AKI (acute kidney injury) (HCC)   Hyponatremia    Shawn Taylor is a 64 yo AA male admitted s/p respiratory arrest followed by cardiac arrest, cardiogenic shock, pulmonary edema, severe metabolic acidosis, BLE DVT'sconcerning for possible pulmonary embolismsrequiring mechanical intubation and levophed gtt. he was admitted on 3/22 with acute hypoxic respiratory failure secondary to metabolic acidosis and pulmonary edema.  Found with a flutter RVR.  While bed availability in stepdown unit was pending patient had respiratory arrest which led to cardiac arrest and he was mechanically intubated and admitted to the ICU.  An echocardiogram revealed severe systolic dysfunction.  He is status post extubation on BiPAP.  He had multiorgan failure.  Echo with EF less than 20%.  With bilateral occlusive DVT of lower extremity.  He has significant thrombocytopenia post heparin infusion.  Patient was switched to argatroban with pharmacy consult.  His BNP is trending down.  Tolerating Lasix.  Transferred to the hospitalist service on 02/23/2020   Acute Hypoxic Respiratory Failure -possibly due to flash pulmonary edema and acute CHF exacerbation -continue Bronchodilator Therapy -Wean O2 as able, currently on 1L --continue diuretic as below  Acute on chronic systolic CHF with EF<20% -background of accelerated Atrial fultter  -cardiology on case - appreciate input  - BNP >900 trending to 500 --Has been on IV lasix 40 mg  daily, and transitioned to PO Lasix on 4/4 PLAN: --Will switch back to IV lasix since CXR showed persistent pulm edema --Strict I/O  Bilateral occlusive DVT of lower extermities  - significant thrombocytopenia post Heparin infusion, switched to argatroban  -PF4 and Serotonin release in process, however, HIT abx neg. - Transition to Eliquis on 4/2. --continue Eliquis  Dysphagia --pt reported difficult swallowing solids this morning, but not liquids. --SLP eval found no overt aspiration --Dys 1 diet for now  Hospital delirium --started getting confused, agitated today 4/5 --sitter --Seroquel nightly and IV haldol PRN  Chest pain, ACS ruled out --complained of chest pain morning of 4/5 --trop 40's flat, no EKG changes to suggest ACS  Discontinue Foley on 4/1, no issue with retention.  Rectal tube in for watery stool --continue fibercon for stool bulking   DVT prophylaxis: FT:DDUKGUR Code Status: Full code  Palliative care following Family Communication:  Disposition Plan: SNF rehab, though pt has no insurance, unknown when will have a bed.   Subjective and Interval History:  Pt complained of chest pain today, cardiac workup neg for ACS.  Also became more agitated and confused, trying to climb out of bed.  No fever, N/V/D.  Taking all his meds today.  Sitter, seroquel and haldol ordered.    Objective: Vitals:   02/27/20 1317 02/27/20 1429 02/27/20 1556 02/27/20 1717  BP: (!) 185/126 (!) 155/100 (!) 164/97 (!) 147/78  Pulse: (!) 105 (!) 104 (!) 103 93  Resp:      Temp:  98 F (36.7 C) (!) 97.2 F (36.2 C)   TempSrc:  SpO2:  99% 100% 100%  Weight:      Height:        Intake/Output Summary (Last 24 hours) at 02/27/2020 1842 Last data filed at 02/27/2020 0532 Gross per 24 hour  Intake 10 ml  Output 700 ml  Net -690 ml   Filed Weights   02/25/20 0503 02/26/20 0512 02/27/20 0530  Weight: 77.1 kg 63.9 kg 72.1 kg    Examination:   Constitutional: NAD, alert  HEENT: conjunctivae icterus, lids normal, EOMI CV: RRR no M,R,G. Distal pulses +2.  No cyanosis.   RESP: Rhonchi, normal respiratory effort, on 1L GI: +BS, abdomen round Extremities: edema much improved in BLE SKIN: warm, dry and intact Neuro: II - XII grossly intact.  Sensation intact   Data Reviewed: I have personally reviewed following labs and imaging studies  CBC: Recent Labs  Lab 02/21/20 0416 02/21/20 0416 02/22/20 0407 02/22/20 0407 02/23/20 0121 02/24/20 3810 02/25/20 0452 02/26/20 0420 02/27/20 0548  WBC 9.5   < > 11.6*   < > 11.8* 17.0* 14.9* 12.4* 13.9*  NEUTROABS 8.2*  --  10.1*  --  9.8*  --   --   --   --   HGB 14.4   < > 13.9   < > 14.1 13.3 13.2 13.6 13.6  HCT 44.6   < > 44.3   < > 45.0 42.1 40.8 41.8 41.7  MCV 92.5   < > 94.5   < > 94.3 93.6 92.1 90.5 90.5  PLT 69*   < > 58*   < > 58* 67* 69* 67* 65*   < > = values in this interval not displayed.   Basic Metabolic Panel: Recent Labs  Lab 02/23/20 0121 02/24/20 0907 02/25/20 0452 02/26/20 0420 02/27/20 0548  NA 146* 141 141 145 140  K 3.3* 3.7 3.3* 3.1* 3.7  CL 103 103 105 108 105  CO2 33* 31 26 28 26   GLUCOSE 100* 113* 126* 111* 131*  BUN 52* 38* 35* 32* 25*  CREATININE 1.52* 1.36* 1.22 1.27* 1.02  CALCIUM 9.0 8.6* 8.6* 8.6* 8.3*  MG 1.8 1.8 1.9 1.6* 1.8  PHOS 3.5 3.0 2.8 3.3 2.1*   GFR: Estimated Creatinine Clearance: 75.6 mL/min (by C-G formula based on SCr of 1.02 mg/dL). Liver Function Tests: Recent Labs  Lab 02/21/20 0416 02/22/20 0407  AST 85* 101*  ALT 307* 251*  ALKPHOS 92 91  BILITOT 3.3* 4.4*  PROT 6.0* 6.5  ALBUMIN 2.5* 2.8*   No results for input(s): LIPASE, AMYLASE in the last 168 hours. No results for input(s): AMMONIA in the last 168 hours. Coagulation Profile: No results for input(s): INR, PROTIME in the last 168 hours. Cardiac Enzymes: No results for input(s): CKTOTAL, CKMB, CKMBINDEX, TROPONINI in the last 168 hours. BNP (last 3 results) No results for  input(s): PROBNP in the last 8760 hours. HbA1C: No results for input(s): HGBA1C in the last 72 hours. CBG: Recent Labs  Lab 02/25/20 1219 02/25/20 1608 02/26/20 0822 02/26/20 1226 02/26/20 1608  GLUCAP 128* 108* 124* 242* 150*   Lipid Profile: No results for input(s): CHOL, HDL, LDLCALC, TRIG, CHOLHDL, LDLDIRECT in the last 72 hours. Thyroid Function Tests: No results for input(s): TSH, T4TOTAL, FREET4, T3FREE, THYROIDAB in the last 72 hours. Anemia Panel: No results for input(s): VITAMINB12, FOLATE, FERRITIN, TIBC, IRON, RETICCTPCT in the last 72 hours. Sepsis Labs: Recent Labs  Lab 02/21/20 0416  PROCALCITON 0.65    Recent Results (from the past 240 hour(s))  Culture, respiratory (non-expectorated)     Status: None   Collection Time: 02/20/20 12:18 PM   Specimen: Tracheal Aspirate; Respiratory  Result Value Ref Range Status   Specimen Description   Final    TRACHEAL ASPIRATE Performed at [facility], [address] [place], [place] [address]    Special Requests   Final    NONE Performed at [facility], [address] [place], [place] [address]    Gram Stain   Final    MODERATE WBC PRESENT, PREDOMINANTLY PMN NO ORGANISMS SEEN Performed at [facility], [address], [place], [place] [address]    Culture RARE CANDIDA ALBICANS  Final   Report Status 02/23/2020 FINAL  Final      Radiology Studies: DG Chest 1 View  Result Date: 02/27/2020 CLINICAL DATA:  Shortness of breath. EXAM: CHEST  1 VIEW COMPARISON:  02/22/2020 FINDINGS: Stable cardiac enlargement. Stable tortuosity and calcification of the thoracic aorta. Moderate central vascular congestion and probable mild interstitial edema. Possible small left pleural effusion and streaky left basilar atelectasis. IMPRESSION: Stable cardiac enlargement and probable mild CHF. Electronically Signed   By: Rudie Meyer M.D.   On: 02/27/2020 06:11   DG Chest Port 1 View  Result Date:  02/27/2020 CLINICAL DATA:  Dyspnea. EXAM: PORTABLE CHEST 1 VIEW COMPARISON:  Same day. FINDINGS: Stable cardiomegaly. Atherosclerosis of thoracic aorta is noted. No pneumothorax is noted. Stable bilateral lung opacities are noted concerning for pulmonary edema, with probable bibasilar subsegmental atelectasis. Small left pleural effusion may be present. Bony thorax is unremarkable. IMPRESSION: Stable bilateral pulmonary edema with probable bibasilar subsegmental atelectasis and possible small left pleural effusion. Aortic Atherosclerosis (ICD10-I70.0). Electronically Signed   By: Lupita Raider M.D.   On: 02/27/2020 16:28     Scheduled Meds: . apixaban  10 mg Oral BID   Followed by  . [START ON 03/02/2020] apixaban  5 mg Oral BID  . atorvastatin  40 mg Oral Q2200  . chlorhexidine  15 mL Mouth Rinse BID  . Chlorhexidine Gluconate Cloth  6 each Topical Daily  . feeding supplement (ENSURE ENLIVE)  237 mL Oral TID BM  . folic acid  1 mg Oral Daily  . furosemide  40 mg Intravenous Once  . isosorbide-hydrALAZINE  1 tablet Oral TID  . lisinopril  2.5 mg Oral Daily  . mouth rinse  15 mL Mouth Rinse q12n4p  . metoprolol tartrate  12.5 mg Oral BID  . multivitamin with minerals  1 tablet Per Tube Daily  . pantoprazole  40 mg Oral Daily  . phosphorus  500 mg Oral Q4H  . polycarbophil  625 mg Oral Daily  . QUEtiapine  100 mg Oral QHS  . sodium chloride flush  10-40 mL Intracatheter Q12H  . spironolactone  12.5 mg Oral Daily  . thiamine  100 mg Per Tube Daily   Continuous Infusions:    LOS: 14 days     Darlin Priestly, MD [facility] If 7PM-7AM, please contact night-coverage 02/27/2020, 6:42 PM

## 2020-02-27 NOTE — Progress Notes (Signed)
Dr. Fran Lowes notified re: pt's chest pain which he states is "new" 7 out of 10 pain. No SOB or any other symptoms. I called lab to notify them of STAT troponin and NT getting STAT EKG. Pt sitting up in bed resting in no acute distress just saying that his chest hurts. Will continue to assess closely.

## 2020-02-27 NOTE — Progress Notes (Signed)
Physical Therapy Treatment Patient Details Name: Shawn Taylor MRN: [id number] DOB: 05/01/1956 Today's Date: 02/27/2020    History of Present Illness Pt is a 64 yo male presented to ED in atrial flutter 02/13/20 s/p respiratory arrest followed by cardiac arrest, cardiogenic shock, pulmonary edema, severe metabolic acidosis, BLE DVT's concerning for possible pulmonary embolisms requiring mechanical intubation and levophed. Now extubated 02/21/20. PMH of HTN, migraines.    PT Comments    RN in room.  OK for session. Pt without c/o.  Ankle pumps initiated while getting dynamap.  BP noted to be 155/110 P 99.  Re-checed 158/115 P 103.  Pt with c/o chest pain and rubs chest several times.  Generally fidgety and looking for car keys and his pants.  RN notified and will relay to MD.  Further session deferred at this time.   Follow Up Recommendations  SNF     Equipment Recommendations       Recommendations for Other Services       Precautions / Restrictions Precautions Precautions: Fall Restrictions Other Position/Activity Restrictions: rectal tube    Mobility  Bed Mobility                  Transfers                    Ambulation/Gait                 Stairs             Wheelchair Mobility    Modified [place] (Stroke Patients Only)       Balance                                            Cognition Arousal/Alertness: Awake/alert Behavior During Therapy: Restless Overall Cognitive Status: No family/caregiver present to determine baseline cognitive functioning                                 General Comments: fidgety today, asking to keys and his pants to put on.      Exercises Other Exercises Other Exercises: ankle pumps x 10    General Comments        Pertinent Vitals/Pain Pain Assessment: Faces Faces Pain Scale: Hurts little more Pain Location: chest - center.  begins rubbing chest Pain Descriptors  / Indicators: Aching Pain Intervention(s): Monitored during session    Home Living                      Prior Function            PT Goals (current goals can now be found in the care plan section) Progress towards PT goals: Progressing toward goals    Frequency    Min 2X/week      PT Plan Current plan remains appropriate    Co-evaluation              AM-PAC PT "6 Clicks" Mobility   Outcome Measure  Help needed turning from your back to your side while in a flat bed without using bedrails?: A Lot Help needed moving from lying on your back to sitting on the side of a flat bed without using bedrails?: A Lot Help needed moving to and from a bed to a chair (including a wheelchair)?: Total  Help needed standing up from a chair using your arms (e.g., wheelchair or bedside chair)?: Total Help needed to walk in hospital room?: Total Help needed climbing 3-5 steps with a railing? : Total 6 Click Score: 8    End of Session   Activity Tolerance: Treatment limited secondary to medical complications (Comment) Patient left: in bed;with call bell/phone within reach;with bed alarm set;Other (comment) Nurse Communication: Other (comment)(chest pain and BP)       Time: 2025-4270 PT Time Calculation (min) (ACUTE ONLY): 15 min  Charges:  $Therapeutic Activity: 8-22 mins                    Danielle Dess, PTA 02/27/20, 10:36 AM

## 2020-02-27 NOTE — Progress Notes (Signed)
Daily Progress Note   Patient Name: Shawn Taylor       Date: 02/27/2020 DOB: [birth year]  Age: 64 y.o. MRN#: [id number] Attending Physician: Darlin Priestly, MD Primary Care Physician: Patient, No Pcp Per Admit Date: 02/13/2020  Reason for Consultation/Follow-up: Establishing goals of care  Subjective: Patient sitting in bed. He denies complaint. He is eager to go to a facility and would like to continue care to improve his status.    Length of Stay: 14  Current Medications: Scheduled Meds:  . apixaban  10 mg Oral BID   Followed by  . [START ON 03/02/2020] apixaban  5 mg Oral BID  . atorvastatin  40 mg Oral Q2200  . chlorhexidine  15 mL Mouth Rinse BID  . Chlorhexidine Gluconate Cloth  6 each Topical Daily  . feeding supplement (ENSURE ENLIVE)  237 mL Oral TID BM  . folic acid  1 mg Oral Daily  . furosemide  40 mg Oral BID  . isosorbide-hydrALAZINE  1 tablet Oral TID  . lisinopril  2.5 mg Oral Daily  . mouth rinse  15 mL Mouth Rinse q12n4p  . metoprolol tartrate  12.5 mg Oral BID  . multivitamin with minerals  1 tablet Per Tube Daily  . pantoprazole  40 mg Oral Daily  . polycarbophil  625 mg Oral Daily  . sodium chloride flush  10-40 mL Intracatheter Q12H  . spironolactone  12.5 mg Oral Daily  . thiamine  100 mg Per Tube Daily    Continuous Infusions:   PRN Meds: acetaminophen **OR** acetaminophen, loperamide, nitroGLYCERIN, sodium chloride flush, traZODone  Physical Exam Pulmonary:     Effort: Pulmonary effort is normal.  Skin:    General: Skin is warm and dry.  Neurological:     Mental Status: He is alert.             Vital Signs: BP (!) 155/100   Pulse (!) 104   Temp 98 F (36.7 C)   Resp 17   Ht 5\' 11"  (1.803 m)   Wt 72.1 kg   SpO2 99%   BMI 22.18 kg/m  SpO2:  SpO2: 99 % O2 Device: O2 Device: Nasal Cannula O2 Flow Rate: O2 Flow Rate (L/min): 2 L/min  Intake/output summary:   Intake/Output Summary (Last 24 hours) at 02/27/2020 1534 Last data filed  at 02/27/2020 0532 Gross per 24 hour  Intake 10 ml  Output 700 ml  Net -690 ml   LBM: Last BM Date: (Rectal tube in place for diarrhea. Draining.) Baseline Weight: Weight: 90.7 kg Most recent weight: Weight: 72.1 kg       Palliative Assessment/Data:      Patient Active Problem List   Diagnosis Date Noted  . Acute systolic CHF (congestive heart failure) ([place]) 02/26/2020  . DVT of lower extremity, bilateral ([place]) 02/26/2020  . Dysphagia 02/26/2020  . Thrombocytopenia ([place]) 02/26/2020  . Pulmonary edema 02/26/2020  . Cocaine abuse ([place]) 02/26/2020  . Alcohol abuse 02/26/2020  . AKI (acute kidney injury) ([place]) 02/26/2020  . Hyponatremia 02/26/2020  . Cardiac arrest, cause unspecified ([place]) 02/16/2020  . Liver failure ([place]) 02/16/2020  . Acute hypoxemic respiratory failure ([place]) 02/16/2020  . Atrial flutter ([place]) 02/13/2020    Palliative Care Assessment & Plan    Recommendations/Plan:  He is eager to go to a facility and continue care.   Recommend palliative at D/C.     Code Status:    Code Status Orders  (From admission, onward)         Start     Ordered   02/13/20 1736  Full code  Continuous     02/13/20 1738        Code Status History    This patient has a current code status but no historical code status.   Advance Care Planning Activity       Prognosis:   Unable to determine  Discharge Planning:  To Be Determined  Care plan was discussed with RN  Thank you for allowing the Palliative Medicine Team to assist in the care of this patient.   Total Time 15 min Prolonged Time Billed  no      Greater than 50%  of this time was spent counseling and coordinating care related to the above assessment and plan.  Asencion Gowda, NP  Please contact  Palliative Medicine Team phone at [phone] for questions and concerns.

## 2020-02-27 NOTE — Progress Notes (Signed)
ANTICOAGULATION CONSULT NOTE - Consult  Pharmacy Consult for Apixaban Indication: DVT  Allergies  Allergen Reactions  . Heparin     Suspected HIT: labs pending    Patient Measurements: Height: 5\' 11"  (180.3 cm) Weight: 72.1 kg (159 lb) IBW/kg (Calculated) : 75.3  Vital Signs: Temp: 98.4 F (36.9 C) (04/05 0723) Temp Source: Oral (04/05 0723) BP: 158/115 (04/05 1000) Pulse Rate: 103 (04/05 1000)  Labs: Recent Labs    02/25/20 0452 02/25/20 0452 02/26/20 0420 02/27/20 0548  HGB 13.2   < > 13.6 13.6  HCT 40.8  --  41.8 41.7  PLT 69*  --  67* 65*  CREATININE 1.22  --  1.27* 1.02   < > = values in this interval not displayed.    Estimated Creatinine Clearance: 75.6 mL/min (by C-G formula based on SCr of 1.02 mg/dL).   Assessment: 64 yo male who transitioned from argatroban drip to apixaban on 4/2  Goal of Therapy:  Monitor platelets by anticoagulation protocol: Yes   Plan:  Continue apixaban 10 mg PO BID x7 days then 5 mg PO BID CBC and SCr at least every three days per policy  Pharmacy will continue to follow.   6/2 02/27/2020,11:26 AM

## 2020-02-27 NOTE — TOC Progression Note (Signed)
Transition of Care [facility]) - Progression Note    Patient Details  Name: Shawn Taylor MRN: [id number] Date of Birth: 16-Apr-1956  Transition of Care [facility]) CM/SW Contact  Shawn Route, RN Phone Number: 02/27/2020, 9:12 AM  Clinical Narrative:     Patient continues to medically work-up at this time, he had rhythm changes early this morning.   Reviewing notes it appears a living will and medical power of attorney were completed for this patient and his daughter Lacretia Nicks is listed.  Her phone number is not listed in chart.  Spoke with nurse and she does not have number.  I will attempt to get number from family contacts or visitors today.         Expected Discharge Plan and Services                                                 Social Determinants of Health (SDOH) Interventions    Readmission Risk Interventions No flowsheet data found.

## 2020-02-27 NOTE — Progress Notes (Signed)
Tele called reporting pt. Was in a. Fib with RVR. Nurse went to assess pt. Pt. Oxygen was off. Respiration increased 31/33. He was very fidgeted. Audible crackles. Oxygen reapplied  Vital signs taken. Pt. States he feels fine. MD notified. New orders placed

## 2020-02-28 LAB — CBC
HCT: 38.9 % — ABNORMAL LOW (ref 39.0–52.0)
Hemoglobin: 12.5 g/dL — ABNORMAL LOW (ref 13.0–17.0)
MCH: 29.2 pg (ref 26.0–34.0)
MCHC: 32.1 g/dL (ref 30.0–36.0)
MCV: 90.9 fL (ref 80.0–100.0)
Platelets: 61 10*3/uL — ABNORMAL LOW (ref 150–400)
RBC: 4.28 MIL/uL (ref 4.22–5.81)
RDW: 14.8 % (ref 11.5–15.5)
WBC: 10.3 10*3/uL (ref 4.0–10.5)
nRBC: 0 % (ref 0.0–0.2)

## 2020-02-28 LAB — BASIC METABOLIC PANEL
Anion gap: 11 (ref 5–15)
BUN: 23 mg/dL (ref 8–23)
CO2: 25 mmol/L (ref 22–32)
Calcium: 8.2 mg/dL — ABNORMAL LOW (ref 8.9–10.3)
Chloride: 105 mmol/L (ref 98–111)
Creatinine, Ser: 1.14 mg/dL (ref 0.61–1.24)
GFR calc Af Amer: >60 mL/min (ref >60)
GFR calc non Af Amer: >60 mL/min (ref >60)
Glucose, Bld: 125 mg/dL — ABNORMAL HIGH (ref 70–99)
Potassium: 3.1 mmol/L — ABNORMAL LOW (ref 3.5–5.1)
Sodium: 141 mmol/L (ref 135–145)

## 2020-02-28 LAB — MAGNESIUM: Magnesium: 1.5 mg/dL — ABNORMAL LOW (ref 1.7–2.4)

## 2020-02-28 LAB — PHOSPHORUS: Phosphorus: 3.6 mg/dL (ref 2.5–4.6)

## 2020-02-28 MED ORDER — POTASSIUM CHLORIDE CRYS ER 20 MEQ PO TBCR
40.0000 meq | EXTENDED_RELEASE_TABLET | ORAL | Status: AC
  Administered 2020-02-28 (×2): 40 meq via ORAL
  Filled 2020-02-28 (×2): qty 2

## 2020-02-28 MED ORDER — FUROSEMIDE 10 MG/ML IJ SOLN
40.0000 mg | Freq: Once | INTRAMUSCULAR | Status: AC
  Administered 2020-02-28: 40 mg via INTRAVENOUS
  Filled 2020-02-28: qty 4

## 2020-02-28 MED ORDER — MAGNESIUM SULFATE 2 GM/50ML IV SOLN
2.0000 g | Freq: Once | INTRAVENOUS | Status: AC
  Administered 2020-02-28: 2 g via INTRAVENOUS
  Filled 2020-02-28: qty 50

## 2020-02-28 NOTE — Progress Notes (Signed)
PROGRESS NOTE    Shawn Taylor  RWE:[id number] DOB: 06-24-1956 DOA: 02/13/2020 PCP: Patient, No Pcp Per    Assessment & Plan:   Principal Problem:   Cardiac arrest, cause unspecified (HCC) Active Problems:   Atrial flutter (HCC)   Liver failure (HCC)   Acute hypoxemic respiratory failure (HCC)   Acute systolic CHF (congestive heart failure) (HCC)   DVT of lower extremity, bilateral (HCC)   Dysphagia   Thrombocytopenia (HCC)   Pulmonary edema   Cocaine abuse (HCC)   Alcohol abuse   AKI (acute kidney injury) (HCC)   Hyponatremia    Shawn Taylor is a 64 yo AA male admitted s/p respiratory arrest followed by cardiac arrest, cardiogenic shock, pulmonary edema, severe metabolic acidosis, BLE DVT'sconcerning for possible pulmonary embolismsrequiring mechanical intubation and levophed gtt. he was admitted on 3/22 with acute hypoxic respiratory failure secondary to metabolic acidosis and pulmonary edema.  Found with a flutter RVR.  While bed availability in stepdown unit was pending patient had respiratory arrest which led to cardiac arrest and he was mechanically intubated and admitted to the ICU.  An echocardiogram revealed severe systolic dysfunction.  He is status post extubation on BiPAP.  He had multiorgan failure.  Echo with EF less than 20%.  With bilateral occlusive DVT of lower extremity.  He has significant thrombocytopenia post heparin infusion.  Patient was switched to argatroban with pharmacy consult.  His BNP is trending down.  Tolerating Lasix.  Transferred to the hospitalist service on 02/23/2020   Acute Hypoxic Respiratory Failure -possibly due to flash pulmonary edema and acute CHF exacerbation -continue Bronchodilator Therapy -Wean O2 as able, currently on 1L --continue diuretic as below  Acute on chronic systolic CHF with EF<20% -background of accelerated Atrial fultter  -cardiology on case - appreciate input  - BNP >900 trending to 500 --Has been on IV lasix 40 mg  daily, and transitioned to PO Lasix on 4/4 PLAN: --switch back to IV lasix 40 mg daily since CXR showed persistent pulm edema --Strict I/O  Bilateral occlusive DVT of lower extermities  - significant thrombocytopenia post Heparin infusion, switched to argatroban  -PF4 and Serotonin release in process, however, HIT abx neg. - Transition to Eliquis on 4/2. --continue Eliquis  Dysphagia --pt reported difficult swallowing solids, but not liquids. --SLP eval found no overt aspiration, but some concern for esophageal dysmotility --Dys 3 diet   Hospital delirium --getting confused, agitated on 4/5, but improved the next day. --sitter PRN --Seroquel nightly and IV haldol PRN  Chest pain, ACS ruled out --complained of chest pain morning of 4/5 --trop 40's flat, no EKG changes to suggest ACS  Discontinue Foley on 4/1, no issue with retention.  Rectal tube in for watery stool --remove rectal tube today --continue fibercon for stool bulking   DVT prophylaxis: [id number] Code Status: Full code  Palliative care following Family Communication: Son and aunt updated at bedside today Disposition Plan: SNF rehab, though pt has no insurance.  TOC plans to initiate Medicaid application.  Pt is believed to have decision-making capacity.  He had no dementia prior to this hospitalization, and appeared to family to have suffered no cognitive decline right after he woke up from his cardiac arrest.     Subjective and Interval History:  Pt was alert, oriented, calm today, and didn't remember what happened yesterday.  Had thick secretion that pt was coughing up.  Drinking Ensure but still didn't eat much (not liking his foods).  No fever, dyspnea, chest pain,  abdominal pain, N/V/D, dysuria.   Objective: Vitals:   02/28/20 0556 02/28/20 0818 02/28/20 1210 02/28/20 1550  BP: (!) 149/77 126/90 (!) 136/91 130/84  Pulse: 83 84 87 90  Resp: 20 18 19 20   Temp: 97.6 F (36.4 C) 98.1 F (36.7 C) 97.8  F (36.6 C) 97.6 F (36.4 C)  TempSrc: Oral Oral Oral Oral  SpO2: 98% 98% 100% 100%  Weight: 81.3 kg     Height:        Intake/Output Summary (Last 24 hours) at 02/28/2020 1728 Last data filed at 02/28/2020 1552 Gross per 24 hour  Intake 1160 ml  Output 3000 ml  Net -1840 ml   Filed Weights   02/26/20 0512 02/27/20 0530 02/28/20 0556  Weight: 63.9 kg 72.1 kg 81.3 kg    Examination:   Constitutional: NAD, alert, oriented, calm, coherent HEENT: conjunctivae icterus, lids normal, EOMI CV: RRR no M,R,G. Distal pulses +2.  No cyanosis.   RESP: Rhonchi, normal respiratory effort, on 1L GI: +BS, abdomen round Extremities: edema resolved in BLE SKIN: warm, dry and intact Neuro: II - XII grossly intact.  Sensation intact   Data Reviewed: I have personally reviewed following labs and imaging studies  CBC: Recent Labs  Lab 02/22/20 0407 02/22/20 0407 02/23/20 0121 02/23/20 0121 02/24/20 04/25/20 02/25/20 0452 02/26/20 0420 02/27/20 0548 02/28/20 0515  WBC 11.6*   < > 11.8*   < > 17.0* 14.9* 12.4* 13.9* 10.3  NEUTROABS 10.1*  --  9.8*  --   --   --   --   --   --   HGB 13.9   < > 14.1   < > 13.3 13.2 13.6 13.6 12.5*  HCT 44.3   < > 45.0   < > 42.1 40.8 41.8 41.7 38.9*  MCV 94.5   < > 94.3   < > 93.6 92.1 90.5 90.5 90.9  PLT 58*   < > 58*   < > 67* 69* 67* 65* 61*   < > = values in this interval not displayed.   Basic Metabolic Panel: Recent Labs  Lab 02/24/20 0907 02/25/20 0452 02/26/20 0420 02/27/20 0548 02/28/20 0515  NA 141 141 145 140 141  K 3.7 3.3* 3.1* 3.7 3.1*  CL 103 105 108 105 105  CO2 31 26 28 26 25   GLUCOSE 113* 126* 111* 131* 125*  BUN 38* 35* 32* 25* 23  CREATININE 1.36* 1.22 1.27* 1.02 1.14  CALCIUM 8.6* 8.6* 8.6* 8.3* 8.2*  MG 1.8 1.9 1.6* 1.8 1.5*  PHOS 3.0 2.8 3.3 2.1* 3.6   GFR: Estimated Creatinine Clearance: 70.6 mL/min (by C-G formula based on SCr of 1.14 mg/dL). Liver Function Tests: Recent Labs  Lab 02/22/20 0407  AST 101*  ALT  251*  ALKPHOS 91  BILITOT 4.4*  PROT 6.5  ALBUMIN 2.8*   No results for input(s): LIPASE, AMYLASE in the last 168 hours. No results for input(s): AMMONIA in the last 168 hours. Coagulation Profile: No results for input(s): INR, PROTIME in the last 168 hours. Cardiac Enzymes: No results for input(s): CKTOTAL, CKMB, CKMBINDEX, TROPONINI in the last 168 hours. BNP (last 3 results) No results for input(s): PROBNP in the last 8760 hours. HbA1C: No results for input(s): HGBA1C in the last 72 hours. CBG: Recent Labs  Lab 02/25/20 1219 02/25/20 1608 02/26/20 0822 02/26/20 1226 02/26/20 1608  GLUCAP 128* 108* 124* 242* 150*   Lipid Profile: No results for input(s): CHOL, HDL, LDLCALC, TRIG, CHOLHDL, LDLDIRECT in  the last 72 hours. Thyroid Function Tests: No results for input(s): TSH, T4TOTAL, FREET4, T3FREE, THYROIDAB in the last 72 hours. Anemia Panel: No results for input(s): VITAMINB12, FOLATE, FERRITIN, TIBC, IRON, RETICCTPCT in the last 72 hours. Sepsis Labs: No results for input(s): PROCALCITON, LATICACIDVEN in the last 168 hours.  Recent Results (from the past 240 hour(s))  Culture, respiratory (non-expectorated)     Status: None   Collection Time: 02/20/20 12:18 PM   Specimen: Tracheal Aspirate; Respiratory  Result Value Ref Range Status   Specimen Description   Final    TRACHEAL ASPIRATE Performed at [facility], [address] [place], [place] [address]    Special Requests   Final    NONE Performed at [facility], [address] [place], [place] [address]    Gram Stain   Final    MODERATE WBC PRESENT, PREDOMINANTLY PMN NO ORGANISMS SEEN Performed at [facility], [place] [address] [place], [place] [address]    Culture RARE CANDIDA ALBICANS  Final   Report Status 02/23/2020 FINAL  Final      Radiology Studies: DG Chest 1 View  Result Date: 02/27/2020 CLINICAL DATA:  Shortness of breath. EXAM: CHEST  1 VIEW COMPARISON:  02/22/2020  FINDINGS: Stable cardiac enlargement. Stable tortuosity and calcification of the thoracic aorta. Moderate central vascular congestion and probable mild interstitial edema. Possible small left pleural effusion and streaky left basilar atelectasis. IMPRESSION: Stable cardiac enlargement and probable mild CHF. Electronically Signed   By: Marijo Sanes M.D.   On: 02/27/2020 06:11   DG Chest Port 1 View  Result Date: 02/27/2020 CLINICAL DATA:  Dyspnea. EXAM: PORTABLE CHEST 1 VIEW COMPARISON:  Same day. FINDINGS: Stable cardiomegaly. Atherosclerosis of thoracic aorta is noted. No pneumothorax is noted. Stable bilateral lung opacities are noted concerning for pulmonary edema, with probable bibasilar subsegmental atelectasis. Small left pleural effusion may be present. Bony thorax is unremarkable. IMPRESSION: Stable bilateral pulmonary edema with probable bibasilar subsegmental atelectasis and possible small left pleural effusion. Aortic Atherosclerosis (ICD10-I70.0). Electronically Signed   By: Marijo Conception M.D.   On: 02/27/2020 16:28     Scheduled Meds: . apixaban  10 mg Oral BID   Followed by  . [START ON 03/02/2020] apixaban  5 mg Oral BID  . atorvastatin  40 mg Oral Q2200  . chlorhexidine  15 mL Mouth Rinse BID  . Chlorhexidine Gluconate Cloth  6 each Topical Daily  . feeding supplement (ENSURE ENLIVE)  237 mL Oral TID BM  . folic acid  1 mg Oral Daily  . isosorbide-hydrALAZINE  1 tablet Oral TID  . lisinopril  2.5 mg Oral Daily  . mouth rinse  15 mL Mouth Rinse q12n4p  . metoprolol tartrate  12.5 mg Oral BID  . multivitamin with minerals  1 tablet Per Tube Daily  . pantoprazole  40 mg Oral Daily  . polycarbophil  625 mg Oral Daily  . QUEtiapine  100 mg Oral QHS  . sodium chloride flush  10-40 mL Intracatheter Q12H  . spironolactone  12.5 mg Oral Daily  . thiamine  100 mg Per Tube Daily   Continuous Infusions:    LOS: 15 days     Enzo Bi, MD [facility] If 7PM-7AM, please  contact night-coverage 02/28/2020, 5:28 PM

## 2020-02-28 NOTE — TOC Progression Note (Addendum)
Transition of Care [facility]) - Progression Note    Patient Details  Name: Shawn Taylor MRN: [id number] Date of Birth: July 31, 1956  Transition of Care [facility]) CM/SW Contact  Shawn Route, RN Phone Number: 02/28/2020, 1:07 PM  Clinical Narrative:     Called the phone number to speak with son [name], his wife answered and gave me a new number to call.  I called that number and it reached a tree service company that has never heard of [name].  I tried to call original number back with no answer.  Will continue to try and reach family.  1310 Called [name]'s number, this time she answered the phone, while we were talking about if a Medicaid application was started the call failed and now her phone is not working now.  1315  [name] returned call and he is work.  He says [name] is probably at work as well.  He said that [name] would be the one that knows about the Medicaid Application.  I will try and get in touch with the Aunt.        Expected Discharge Plan and Services                                                 Social Determinants of Health (SDOH) Interventions    Readmission Risk Interventions No flowsheet data found.

## 2020-02-28 NOTE — Progress Notes (Signed)
Nutrition Follow-up  DOCUMENTATION CODES:   Not applicable  INTERVENTION:   Ensure Enlive po TID, each supplement provides 350 kcal and 20 grams of protein  MVI daily   Magic cup BID with meals, each supplement provides 290 kcal and 9 grams of protein  NUTRITION DIAGNOSIS:   Inadequate oral intake related to inability to eat as evidenced by NPO status. -resolving  GOAL:   Patient will meet greater than or equal to 90% of their needs -progressing   MONITOR:   PO intake, Supplement acceptance, Labs, Weight trends, Skin, I & O's  ASSESSMENT:   64 year old male with PMHx of migraines, HTN admitted s/p respiratory arrest followed by cardiac arrest, cardiogenic shock, pulmonary edema, severe metabolic acidosis, bilateral lower extremity DVTs concerning for possible PE requiring intubation.  Pt advanced to a dysphagia 3/thin liquid diet in 4/3 by SLP; patient was later downgraded to a dysphagia 1 by MD. Pt eating 25-50% of meals and is drinking Ensure supplements. RD will add Magic Cups with lunch and dinner. Per chart, pt down ~21lbs since admit but appears to be back around his UBW. Pt to discharge to rehab when medically appropriate.   Medications reviewed and include: folic acid, MVI, aldactone, thiamine   Labs reviewed: K 3.1(L), P 3.6 wnl, Mg 1.5(L) cbgs- 124, 242, 150 x 48 hrs  Diet Order:   Diet Order            DIET - DYS 1 Room service appropriate? Yes; Fluid consistency: Thin  Diet effective now             EDUCATION NEEDS:   No education needs have been identified at this time  Skin:  Skin Assessment: Reviewed RN Assessment  Last BM:  4/6- TYPE 7  Height:   Ht Readings from Last 1 Encounters:  02/23/20 5\' 11"  (1.803 m)   Weight:   Wt Readings from Last 1 Encounters:  02/28/20 81.3 kg   Ideal Body Weight:  78.2 kg  BMI:  Body mass index is 24.99 kg/m.  Estimated Nutritional Needs:   Kcal:  2000-2200  Protein:  110-120 grams  Fluid:  2  L/day  04/29/20 MS, RD, LDN Please refer to [facility] for RD and/or RD on-call/weekend/after hours pager

## 2020-02-28 NOTE — TOC Progression Note (Signed)
Transition of Care [facility]) - Progression Note    Patient Details  Name: Shawn Taylor MRN: [id number] Date of Birth: 1956/04/16  Transition of Care [facility]) CM/SW Contact  Shawn Route, RN Phone Number: 02/28/2020, 11:06 AM  Clinical Narrative:      Have attempted calling daughter x 5.  No voicemail.   I have passed my name and number to patient, MD, and Nurse to please have the patient's MPOA to call me.          Expected Discharge Plan and Services                                                 Social Determinants of Health (SDOH) Interventions    Readmission Risk Interventions No flowsheet data found.

## 2020-02-28 NOTE — TOC Progression Note (Addendum)
Transition of Care [facility]) - Progression Note    Patient Details  Name: Len Azeez MRN: [id number] Date of Birth: 04/15/1956  Transition of Care [facility]) CM/SW [place], RN Phone Number: 02/28/2020, 3:49 PM  Clinical Narrative:     Met with MD, Patient has capacity to make decisions.  Patient would like to get better and go to rehab.  Met with patient, his Aneta Mins, and his son Beverely Low in room.  Daughter Benedetto Coons and patient gave me permission to discuss with Rosann Auerbach and Beverely Low.  We discussed importance of completing Medicaid Application.  Patient is in agreement to having help completing application.  It is reported, patient lives in a small apartment, he has not been employed in "years".  There are no tax forms.  I mentioned to patient and family, he will need to gather information on places he has lived recently, as well as benefits and resources that he has used to help him recently.  I also told family he would be hearing from a financial counselor that will help give information on this process.      Unable to reach financial counselor for this patient, she has changed positions, so her voicemail is no longer being answered. Spoke with Carolann Littler in Patient Accounting and she gave me the name of the Financial Counselor left at [facility].  Left a voicemail on Rhonda's voicemail.  She is out on PAL today.  Was told that if medicaid application has not been started, then [employer] will be the contact for helping patient complete application starting next Monday.    UPDATE:  Return call from Alma, she will reach out to [facility] on Thursday morning, to help patient get application signed and mailed.          Expected Discharge Plan and Services                                                 Social Determinants of Health (SDOH) Interventions    Readmission Risk Interventions No flowsheet data found.

## 2020-02-29 LAB — CBC
HCT: 37.8 % — ABNORMAL LOW (ref 39.0–52.0)
Hemoglobin: 12.1 g/dL — ABNORMAL LOW (ref 13.0–17.0)
MCH: 29.1 pg (ref 26.0–34.0)
MCHC: 32 g/dL (ref 30.0–36.0)
MCV: 90.9 fL (ref 80.0–100.0)
Platelets: 70 10*3/uL — ABNORMAL LOW (ref 150–400)
RBC: 4.16 MIL/uL — ABNORMAL LOW (ref 4.22–5.81)
RDW: 15.2 % (ref 11.5–15.5)
WBC: 9.8 10*3/uL (ref 4.0–10.5)
nRBC: 0 % (ref 0.0–0.2)

## 2020-02-29 LAB — PHOSPHORUS: Phosphorus: 3.2 mg/dL (ref 2.5–4.6)

## 2020-02-29 LAB — BASIC METABOLIC PANEL
Anion gap: 9 (ref 5–15)
BUN: 27 mg/dL — ABNORMAL HIGH (ref 8–23)
CO2: 27 mmol/L (ref 22–32)
Calcium: 8.3 mg/dL — ABNORMAL LOW (ref 8.9–10.3)
Chloride: 100 mmol/L (ref 98–111)
Creatinine, Ser: 1.32 mg/dL — ABNORMAL HIGH (ref 0.61–1.24)
GFR calc Af Amer: >60 mL/min (ref >60)
GFR calc non Af Amer: 57 mL/min — ABNORMAL LOW (ref >60)
Glucose, Bld: 125 mg/dL — ABNORMAL HIGH (ref 70–99)
Potassium: 3.8 mmol/L (ref 3.5–5.1)
Sodium: 136 mmol/L (ref 135–145)

## 2020-02-29 LAB — MAGNESIUM: Magnesium: 1.7 mg/dL (ref 1.7–2.4)

## 2020-02-29 MED ORDER — ONDANSETRON 4 MG PO TBDP: 4.0000 mg | ORAL_TABLET | Freq: Three times a day (TID) | ORAL | Status: DC | PRN

## 2020-02-29 MED ORDER — FUROSEMIDE 40 MG PO TABS
40.0000 mg | ORAL_TABLET | Freq: Every day | ORAL | Status: DC
  Administered 2020-02-29 – 2020-03-02 (×3): 40 mg via ORAL
  Filled 2020-02-29 (×3): qty 1

## 2020-02-29 MED ORDER — DOCUSATE SODIUM 100 MG PO CAPS: 100.0000 mg | ORAL_CAPSULE | Freq: Two times a day (BID) | ORAL | Status: DC | PRN

## 2020-02-29 MED ORDER — ALUM & MAG HYDROXIDE-SIMETH 200-200-20 MG/5ML PO SUSP
15.0000 mL | Freq: Four times a day (QID) | ORAL | Status: DC | PRN
  Administered 2020-02-29: 15 mL via ORAL
  Filled 2020-02-29: qty 30

## 2020-02-29 MED ORDER — CALCIUM CARBONATE ANTACID 500 MG PO CHEW: 1.0000 | CHEWABLE_TABLET | Freq: Three times a day (TID) | ORAL | Status: DC | PRN

## 2020-02-29 MED ORDER — POLYETHYLENE GLYCOL 3350 17 G PO PACK: 17.0000 g | PACK | Freq: Two times a day (BID) | ORAL | Status: DC | PRN

## 2020-02-29 MED ORDER — GUAIFENESIN-DM 100-10 MG/5ML PO SYRP
10.0000 mL | ORAL_SOLUTION | Freq: Four times a day (QID) | ORAL | Status: DC | PRN
  Administered 2020-02-29 – 2020-03-04 (×5): 10 mL via ORAL
  Filled 2020-02-29 (×5): qty 10

## 2020-02-29 MED ORDER — ONDANSETRON HCL 4 MG/2ML IJ SOLN: 4.0000 mg | Freq: Four times a day (QID) | INTRAMUSCULAR | Status: DC | PRN

## 2020-02-29 NOTE — TOC Progression Note (Addendum)
Transition of Care [facility]) - Progression Note    Patient Details  Name: Shawn Taylor MRN: [id number] Date of Birth: 1956-03-04  Transition of Care [facility]) CM/SW Contact  Margarito Liner, LCSW Phone Number: 02/29/2020, 1:20 PM  Clinical Narrative: Financial counselor having difficulty reaching patient on the phone and patient's cell phone in the room. CSW went by and asked patient if I could assist him with calling her back but he said he was having difficulty speaking today and would prefer the financial counselor call his aunt. CSW left financial counselor a voicemail with her phone number.    2:16 pm: Spoke with aunt and provided update at bedside. She shared that patient lives in an apartment through [employer] and she's hoping he can return there after discharge. Patient gets a Radio producer for $209 per month as well as food stamps. When asked how much he gets with that, patient replied "2 something."  Expected Discharge Plan and Services                                                 Social Determinants of Health (SDOH) Interventions    Readmission Risk Interventions No flowsheet data found.

## 2020-02-29 NOTE — Progress Notes (Signed)
Daily Progress Note   Patient Name: Shawn Taylor       Date: 02/29/2020 DOB: 20-Jan-1956  Age: 64 y.o. MRN#: [id number] Attending Physician: Darlin Priestly, MD Primary Care Physician: Patient, No Pcp Per Admit Date: 02/13/2020  Reason for Consultation/Follow-up: Establishing goals of care  Subjective: Patient sitting in bed. Telesitter at bedside. Patient complains of upper abdominal pain and rubs his chest and abdomen. He states pain has been present several days. He denies additional complaint. Awaiting placement at SNF.   Length of Stay: 16  Current Medications: Scheduled Meds:  . apixaban  10 mg Oral BID   Followed by  . [START ON 03/02/2020] apixaban  5 mg Oral BID  . atorvastatin  40 mg Oral Q2200  . chlorhexidine  15 mL Mouth Rinse BID  . feeding supplement (ENSURE ENLIVE)  237 mL Oral TID BM  . folic acid  1 mg Oral Daily  . furosemide  40 mg Oral Daily  . isosorbide-hydrALAZINE  1 tablet Oral TID  . lisinopril  2.5 mg Oral Daily  . mouth rinse  15 mL Mouth Rinse q12n4p  . metoprolol tartrate  12.5 mg Oral BID  . multivitamin with minerals  1 tablet Per Tube Daily  . pantoprazole  40 mg Oral Daily  . polycarbophil  625 mg Oral Daily  . QUEtiapine  100 mg Oral QHS  . spironolactone  12.5 mg Oral Daily  . thiamine  100 mg Per Tube Daily    Continuous Infusions:   PRN Meds: acetaminophen **OR** acetaminophen, haloperidol lactate, loperamide, nitroGLYCERIN, traZODone  Physical Exam Pulmonary:     Effort: Pulmonary effort is normal.  Abdominal:     Palpations: Abdomen is soft.     Tenderness: There is no abdominal tenderness. There is no guarding.  Skin:    General: Skin is warm and dry.  Neurological:     Mental Status: He is alert.             Vital Signs: BP 128/67  (BP Location: Right Arm)   Pulse 80   Temp 97.7 F (36.5 C) (Oral)   Resp 20   Ht 5\' 11"  (1.803 m)   Wt 77.4 kg   SpO2 100%   BMI 23.79 kg/m  SpO2: SpO2: 100 % O2 Device: O2 Device: Nasal Cannula O2 Flow  Rate: O2 Flow Rate (L/min): 2 L/min  Intake/output summary:   Intake/Output Summary (Last 24 hours) at 02/29/2020 1240 Last data filed at 02/29/2020 0800 Gross per 24 hour  Intake 640 ml  Output 750 ml  Net -110 ml   LBM: Last BM Date: 02/27/20 Baseline Weight: Weight: 90.7 kg Most recent weight: Weight: 77.4 kg       Palliative Assessment/Data:      Patient Active Problem List   Diagnosis Date Noted  . Acute systolic CHF (congestive heart failure) ([place]) 02/26/2020  . DVT of lower extremity, bilateral ([place]) 02/26/2020  . Dysphagia 02/26/2020  . Thrombocytopenia ([place]) 02/26/2020  . Pulmonary edema 02/26/2020  . Cocaine abuse ([place]) 02/26/2020  . Alcohol abuse 02/26/2020  . AKI (acute kidney injury) ([place]) 02/26/2020  . Hyponatremia 02/26/2020  . Cardiac arrest, cause unspecified ([place]) 02/16/2020  . Liver failure ([place]) 02/16/2020  . Acute hypoxemic respiratory failure ([place]) 02/16/2020  . Atrial flutter ([place]) 02/13/2020    Palliative Care Assessment & Plan    Recommendations/Plan:  Will continue to shadow for needs as goals are set at the present time, and he is awaiting SNF palcement.  IM'd primary MD who voices awareness and ordered labs/EKG on 4/5 for the complaint of chest and abdominal pain.   Recommend palliative at D/C.     Code Status:    Code Status Orders  (From admission, onward)         Start     Ordered   02/13/20 1736  Full code  Continuous     02/13/20 1738        Code Status History    This patient has a current code status but no historical code status.   Advance Care Planning Activity       Prognosis:   Unable to determine  Discharge Planning:  To Be Determined  Care plan was discussed with primary MD.   Thank you  for allowing the Palliative Medicine Team to assist in the care of this patient.   Total Time 15 min Prolonged Time Billed  no      Greater than 50%  of this time was spent counseling and coordinating care related to the above assessment and plan.  Asencion Gowda, NP  Please contact Palliative Medicine Team phone at [phone] for questions and concerns.

## 2020-02-29 NOTE — Progress Notes (Signed)
PROGRESS NOTE    Shawn Taylor  BTD:[id number] DOB: 04-20-1956 DOA: 02/13/2020 PCP: Patient, No Pcp Per    Assessment & Plan:   Principal Problem:   Cardiac arrest, cause unspecified (HCC) Active Problems:   Atrial flutter (HCC)   Liver failure (HCC)   Acute hypoxemic respiratory failure (HCC)   Acute systolic CHF (congestive heart failure) (HCC)   DVT of lower extremity, bilateral (HCC)   Dysphagia   Thrombocytopenia (HCC)   Pulmonary edema   Cocaine abuse (HCC)   Alcohol abuse   AKI (acute kidney injury) (HCC)   Hyponatremia    Shawn Taylor is a 64 yo AA male admitted s/p respiratory arrest followed by cardiac arrest, cardiogenic shock, pulmonary edema, severe metabolic acidosis, BLE DVT'sconcerning for possible pulmonary embolismsrequiring mechanical intubation and levophed gtt. he was admitted on 3/22 with acute hypoxic respiratory failure secondary to metabolic acidosis and pulmonary edema.  Found with a flutter RVR.  While bed availability in stepdown unit was pending patient had respiratory arrest which led to cardiac arrest and he was mechanically intubated and admitted to the ICU.  An echocardiogram revealed severe systolic dysfunction.  He is status post extubation on BiPAP.  He had multiorgan failure.  Echo with EF less than 20%.  With bilateral occlusive DVT of lower extremity.  He has significant thrombocytopenia post heparin infusion.  Patient was switched to argatroban with pharmacy consult.  His BNP is trending down.  Tolerating Lasix.  Transferred to the hospitalist service on 02/23/2020   Acute Hypoxic Respiratory Failure, resolved -possibly due to flash pulmonary edema and acute CHF exacerbation -continue Bronchodilator Therapy -Wean down to RA today --continue diuretic as below  Acute on chronic systolic CHF with EF<20% -background of accelerated Atrial fultter  -cardiology on case   - BNP >900 trending to 500 --Has been on IV lasix 40 mg daily, and  transitioned to PO Lasix on 4/4 PLAN: --switch back to IV lasix 40 mg daily since CXR showed persistent pulm edema --Strict I/O  Bilateral occlusive DVT of lower extermities  - significant thrombocytopenia post Heparin infusion, switched to argatroban  -PF4 and Serotonin release in process, however, HIT abx neg. - Transition to Eliquis on 4/2. --continue Eliquis  Dysphagia, improved --pt reported difficult swallowing solids, but not liquids. --SLP eval found no overt aspiration, but some concern for esophageal dysmotility --Dys 3 diet   Hospital delirium, resolved --getting confused, agitated on 4/5, but improved the next day. --Seroquel nightly and IV haldol PRN  Chest pain, ACS ruled out --complained of chest pain morning of 4/5 --trop 40's flat, no EKG changes to suggest ACS  Discontinue Foley on 4/1, no issue with retention.  Rectal tube in for watery stool, removed on 4/6 --d/c fibercon for stool bulking   DVT prophylaxis: TG:GYIRSWN Code Status: Full code  Palliative care following Family Communication: aunt updated at bedside today Disposition Plan: SNF rehab rec currently, though pt has no insurance.  TOC plans to initiate Medicaid application.  Pt is believed to have decision-making capacity.  He had no dementia prior to this hospitalization, and appeared to family to have suffered no cognitive decline right after he woke up from his cardiac arrest.  Continue to work with PT, and if pt improves prior to SNF bed offer, then he may be able to go home with [facility].   Subjective and Interval History:  Pt reported having eaten spaghetti.  Complained of feeling bloated, has been passing gas.  No fever, dyspnea, N/V/D, dysuria.  Objective: Vitals:   02/29/20 0059 02/29/20 0430 02/29/20 0442 02/29/20 0954  BP: 122/73  132/79 128/67  Pulse: 86  80 80  Resp: 20  20   Temp: (!) 97.3 F (36.3 C)  97.7 F (36.5 C)   TempSrc: Oral  Oral   SpO2: 96%  100% 100%  Weight:   77.4 kg    Height:        Intake/Output Summary (Last 24 hours) at 02/29/2020 1743 Last data filed at 02/29/2020 0800 Gross per 24 hour  Intake 400 ml  Output 400 ml  Net 0 ml   Filed Weights   02/27/20 0530 02/28/20 0556 02/29/20 0430  Weight: 72.1 kg 81.3 kg 77.4 kg    Examination:   Constitutional: NAD, alert, oriented, calm, coherent HEENT: lids normal, EOMI CV: RRR no M,R,G. Distal pulses +2.  No cyanosis.   RESP: clear, normal respiratory effort, on RA GI: +BS, abdomen round and tympanic  Extremities: edema resolved in BLE SKIN: warm, dry and intact Neuro: II - XII grossly intact.  Sensation intact   Data Reviewed: I have personally reviewed following labs and imaging studies  CBC: Recent Labs  Lab 02/23/20 0121 02/24/20 0907 02/25/20 0452 02/26/20 0420 02/27/20 0548 02/28/20 0515 02/29/20 0532  WBC 11.8*   < > 14.9* 12.4* 13.9* 10.3 9.8  NEUTROABS 9.8*  --   --   --   --   --   --   HGB 14.1   < > 13.2 13.6 13.6 12.5* 12.1*  HCT 45.0   < > 40.8 41.8 41.7 38.9* 37.8*  MCV 94.3   < > 92.1 90.5 90.5 90.9 90.9  PLT 58*   < > 69* 67* 65* 61* 70*   < > = values in this interval not displayed.   Basic Metabolic Panel: Recent Labs  Lab 02/25/20 0452 02/26/20 0420 02/27/20 0548 02/28/20 0515 02/29/20 0532  NA 141 145 140 141 136  K 3.3* 3.1* 3.7 3.1* 3.8  CL 105 108 105 105 100  CO2 26 28 26 25 27   GLUCOSE 126* 111* 131* 125* 125*  BUN 35* 32* 25* 23 27*  CREATININE 1.22 1.27* 1.02 1.14 1.32*  CALCIUM 8.6* 8.6* 8.3* 8.2* 8.3*  MG 1.9 1.6* 1.8 1.5* 1.7  PHOS 2.8 3.3 2.1* 3.6 3.2   GFR: Estimated Creatinine Clearance: 61 mL/min (A) (by C-G formula based on SCr of 1.32 mg/dL (H)). Liver Function Tests: No results for input(s): AST, ALT, ALKPHOS, BILITOT, PROT, ALBUMIN in the last 168 hours. No results for input(s): LIPASE, AMYLASE in the last 168 hours. No results for input(s): AMMONIA in the last 168 hours. Coagulation Profile: No results for  input(s): INR, PROTIME in the last 168 hours. Cardiac Enzymes: No results for input(s): CKTOTAL, CKMB, CKMBINDEX, TROPONINI in the last 168 hours. BNP (last 3 results) No results for input(s): PROBNP in the last 8760 hours. HbA1C: No results for input(s): HGBA1C in the last 72 hours. CBG: Recent Labs  Lab 02/25/20 1219 02/25/20 1608 02/26/20 0822 02/26/20 1226 02/26/20 1608  GLUCAP 128* 108* 124* 242* 150*   Lipid Profile: No results for input(s): CHOL, HDL, LDLCALC, TRIG, CHOLHDL, LDLDIRECT in the last 72 hours. Thyroid Function Tests: No results for input(s): TSH, T4TOTAL, FREET4, T3FREE, THYROIDAB in the last 72 hours. Anemia Panel: No results for input(s): VITAMINB12, FOLATE, FERRITIN, TIBC, IRON, RETICCTPCT in the last 72 hours. Sepsis Labs: No results for input(s): PROCALCITON, LATICACIDVEN in the last 168 hours.  Recent Results (  from the past 240 hour(s))  Culture, respiratory (non-expectorated)     Status: None   Collection Time: 02/20/20 12:18 PM   Specimen: Tracheal Aspirate; Respiratory  Result Value Ref Range Status   Specimen Description   Final    TRACHEAL ASPIRATE Performed at [facility], [address], [place], [place] [address]    Special Requests   Final    NONE Performed at [facility], [address] [place], [place] [address]    Gram Stain   Final    MODERATE WBC PRESENT, PREDOMINANTLY PMN NO ORGANISMS SEEN Performed at [facility], [place] [address], [place],  [address]    Culture RARE CANDIDA ALBICANS  Final   Report Status 02/23/2020 FINAL  Final      Radiology Studies: No results found.   Scheduled Meds: . apixaban  10 mg Oral BID   Followed by  . [START ON 03/02/2020] apixaban  5 mg Oral BID  . atorvastatin  40 mg Oral Q2200  . chlorhexidine  15 mL Mouth Rinse BID  . feeding supplement (ENSURE ENLIVE)  237 mL Oral TID BM  . folic acid  1 mg Oral Daily  . furosemide  40 mg Oral Daily  .  isosorbide-hydrALAZINE  1 tablet Oral TID  . lisinopril  2.5 mg Oral Daily  . mouth rinse  15 mL Mouth Rinse q12n4p  . metoprolol tartrate  12.5 mg Oral BID  . multivitamin with minerals  1 tablet Per Tube Daily  . pantoprazole  40 mg Oral Daily  . QUEtiapine  100 mg Oral QHS  . spironolactone  12.5 mg Oral Daily  . thiamine  100 mg Per Tube Daily   Continuous Infusions:    LOS: 16 days     Enzo Bi, MD [facility] If 7PM-7AM, please contact night-coverage 02/29/2020, 5:43 PM

## 2020-03-01 LAB — PHOSPHORUS: Phosphorus: 2.9 mg/dL (ref 2.5–4.6)

## 2020-03-01 LAB — CBC
HCT: 35.6 % — ABNORMAL LOW (ref 39.0–52.0)
Hemoglobin: 11.6 g/dL — ABNORMAL LOW (ref 13.0–17.0)
MCH: 29.4 pg (ref 26.0–34.0)
MCHC: 32.6 g/dL (ref 30.0–36.0)
MCV: 90.4 fL (ref 80.0–100.0)
Platelets: 79 10*3/uL — ABNORMAL LOW (ref 150–400)
RBC: 3.94 MIL/uL — ABNORMAL LOW (ref 4.22–5.81)
RDW: 14.9 % (ref 11.5–15.5)
WBC: 9.2 10*3/uL (ref 4.0–10.5)
nRBC: 0 % (ref 0.0–0.2)

## 2020-03-01 LAB — BASIC METABOLIC PANEL
Anion gap: 6 (ref 5–15)
BUN: 25 mg/dL — ABNORMAL HIGH (ref 8–23)
CO2: 24 mmol/L (ref 22–32)
Calcium: 8.4 mg/dL — ABNORMAL LOW (ref 8.9–10.3)
Chloride: 103 mmol/L (ref 98–111)
Creatinine, Ser: 1.07 mg/dL (ref 0.61–1.24)
GFR calc Af Amer: >60 mL/min (ref >60)
GFR calc non Af Amer: >60 mL/min (ref >60)
Glucose, Bld: 167 mg/dL — ABNORMAL HIGH (ref 70–99)
Potassium: 4.1 mmol/L (ref 3.5–5.1)
Sodium: 133 mmol/L — ABNORMAL LOW (ref 135–145)

## 2020-03-01 LAB — MAGNESIUM: Magnesium: 1.6 mg/dL — ABNORMAL LOW (ref 1.7–2.4)

## 2020-03-01 MED ORDER — MAGNESIUM SULFATE 2 GM/50ML IV SOLN
2.0000 g | Freq: Once | INTRAVENOUS | Status: AC
  Administered 2020-03-01: 2 g via INTRAVENOUS
  Filled 2020-03-01: qty 50

## 2020-03-01 NOTE — Progress Notes (Signed)
PROGRESS NOTE    Shawn Taylor  LPF:[id number] DOB: 1956-07-05 DOA: 02/13/2020 PCP: Patient, No Pcp Per      Brief Narrative:  Shawn Taylor is a 64 yo AA male admitted s/p respiratory arrest followed by cardiac arrest, cardiogenic shock, pulmonary edema, severe metabolic acidosis, BLE DVT'sconcerning for possible pulmonary embolismsrequiring mechanical intubation and levophed gtt.he was admitted on 3/22 with acute hypoxic respiratory failure secondary to metabolic acidosis and pulmonary edema. Found with a flutter RVR. While bed availability in stepdown unit was pending patient had respiratory arrest which led to cardiac arrest and he was mechanically intubated and admitted to the ICU. An echocardiogram revealed severe systolic dysfunction. He is status post extubation on BiPAP. He had multiorgan failure. Echo with EF less than 20%. With bilateral occlusive DVT of lower extremity. He has significant thrombocytopenia post heparin infusion. Patient was switched to argatroban with pharmacy consult. His BNP is trending down. Tolerating Lasix.  Transferred to the hospitalist service on 02/23/2020    Assessment & Plan:  Acute hypoxic respiratory failure Present on arrival, now resolved.  Acute on chronic systolic CHF Hypertension -Continue atorvastatin -Continue furosemide -Continue lisinopril, BiDil, metoprolol, spironolactone  Bilateral DVT -Continue Eliquis  Delirium -Continue Seroquel  Dysphagia  Chest pain, ACS ruled out  Normocytic anemia Likely from phlebotomy, position, critical illness.  Thrombocytopenia Improving        Disposition: The patient was admitted with cardiac arrest, acute respiratory failure, cardiogenic shock.   I will discharge when safe disposition has been obtained.        MDM: The below labs and imaging reports were reviewed and summarized above.  Medication management as above.  This is a severe life-threatening  illness.   DVT prophylaxis: N/A on Eliquis Code Status: FULL Family Communication: Wife at bedside, son    Consultants:     Procedures:     Antimicrobials:      Culture data:              Subjective: Patient is tired, but able to stand today.  Not able to walk.  He has no chest pain, dyspnea, orthopnea, leg swelling.  Objective: Vitals:   03/01/20 0437 03/01/20 0448 03/01/20 0908 03/01/20 1302  BP: 131/76  131/67 119/77  Pulse: 91  89 89  Resp: 20     Temp: 98 F (36.7 C)   98.5 F (36.9 C)  TempSrc: Oral   Oral  SpO2: 98%  96% 99%  Weight:  78.3 kg    Height:        Intake/Output Summary (Last 24 hours) at 03/01/2020 1435 Last data filed at 03/01/2020 1356 Gross per 24 hour  Intake 717 ml  Output 1910 ml  Net -1193 ml   Filed Weights   02/28/20 0556 02/29/20 0430 03/01/20 0448  Weight: 81.3 kg 77.4 kg 78.3 kg    Examination: General appearance:  adult male, alert and in no acute distress.  Sitting in recliner HEENT: Anicteric, conjunctiva pink, lids and lashes normal. No nasal deformity, discharge, epistaxis.  Lips moist.  Oropharynx moist, no oral lesions. Skin: Warm and dry.   No suspicious rashes or lesions. Cardiac: RRR, nl Z3-G9, 4 systolic ejection murmur noted.  Capillary refill is brisk  JVP normal.  No LE edema.  Radial pulses 2+ and symmetric. Respiratory: Normal respiratory rate and rhythm.  CTAB without rales or wheezes. Abdomen: Abdomen soft.   No tenderness palpation or guarding. No ascites, distension, hepatosplenomegaly.   MSK: No deformities or effusions. Neuro:  Awake and alert.  EOMI, moves all extremities with severe generalized weakness. Speech fluent.    Psych: Sensorium intact and responding to questions, attention normal. Affect normal.  Judgment and insight appear normal.    Data Reviewed: I have personally reviewed following labs and imaging studies:  CBC: Recent Labs  Lab 02/26/20 0420 02/27/20 0548  02/28/20 0515 02/29/20 0532 03/01/20 0526  WBC 12.4* 13.9* 10.3 9.8 9.2  HGB 13.6 13.6 12.5* 12.1* 11.6*  HCT 41.8 41.7 38.9* 37.8* 35.6*  MCV 90.5 90.5 90.9 90.9 90.4  PLT 67* 65* 61* 70* 79*   Basic Metabolic Panel: Recent Labs  Lab 02/26/20 0420 02/27/20 0548 02/28/20 0515 02/29/20 0532 03/01/20 0526  NA 145 140 141 136 133*  K 3.1* 3.7 3.1* 3.8 4.1  CL 108 105 105 100 103  CO2 28 26 25 27 24   GLUCOSE 111* 131* 125* 125* 167*  BUN 32* 25* 23 27* 25*  CREATININE 1.27* 1.02 1.14 1.32* 1.07  CALCIUM 8.6* 8.3* 8.2* 8.3* 8.4*  MG 1.6* 1.8 1.5* 1.7 1.6*  PHOS 3.3 2.1* 3.6 3.2 2.9   GFR: Estimated Creatinine Clearance: 75.3 mL/min (by C-G formula based on SCr of 1.07 mg/dL). Liver Function Tests: No results for input(s): AST, ALT, ALKPHOS, BILITOT, PROT, ALBUMIN in the last 168 hours. No results for input(s): LIPASE, AMYLASE in the last 168 hours. No results for input(s): AMMONIA in the last 168 hours. Coagulation Profile: No results for input(s): INR, PROTIME in the last 168 hours. Cardiac Enzymes: No results for input(s): CKTOTAL, CKMB, CKMBINDEX, TROPONINI in the last 168 hours. BNP (last 3 results) No results for input(s): PROBNP in the last 8760 hours. HbA1C: No results for input(s): HGBA1C in the last 72 hours. CBG: Recent Labs  Lab 02/25/20 1219 02/25/20 1608 02/26/20 0822 02/26/20 1226 02/26/20 1608  GLUCAP 128* 108* 124* 242* 150*   Lipid Profile: No results for input(s): CHOL, HDL, LDLCALC, TRIG, CHOLHDL, LDLDIRECT in the last 72 hours. Thyroid Function Tests: No results for input(s): TSH, T4TOTAL, FREET4, T3FREE, THYROIDAB in the last 72 hours. Anemia Panel: No results for input(s): VITAMINB12, FOLATE, FERRITIN, TIBC, IRON, RETICCTPCT in the last 72 hours. Urine analysis:    Component Value Date/Time   COLORURINE YELLOW (A) 11/23/2017 1549   APPEARANCEUR CLEAR (A) 11/23/2017 1549   LABSPEC 1.005 11/23/2017 1549   PHURINE 5.0 11/23/2017 1549    GLUCOSEU NEGATIVE 11/23/2017 1549   HGBUR SMALL (A) 11/23/2017 1549   BILIRUBINUR NEGATIVE 11/23/2017 1549   KETONESUR NEGATIVE 11/23/2017 1549   PROTEINUR NEGATIVE 11/23/2017 1549   NITRITE NEGATIVE 11/23/2017 1549   LEUKOCYTESUR NEGATIVE 11/23/2017 1549   Sepsis Labs: @LABRCNTIP (procalcitonin:4,lacticacidven:4)  )No results found for this or any previous visit (from the past 240 hour(s)).       Radiology Studies: No results found.      Scheduled Meds: . apixaban  10 mg Oral BID   Followed by  . [START ON 03/02/2020] apixaban  5 mg Oral BID  . atorvastatin  40 mg Oral Q2200  . feeding supplement (ENSURE ENLIVE)  237 mL Oral TID BM  . folic acid  1 mg Oral Daily  . furosemide  40 mg Oral Daily  . isosorbide-hydrALAZINE  1 tablet Oral TID  . lisinopril  2.5 mg Oral Daily  . mouth rinse  15 mL Mouth Rinse q12n4p  . metoprolol tartrate  12.5 mg Oral BID  . multivitamin with minerals  1 tablet Per Tube Daily  . pantoprazole  40 mg  Oral Daily  . QUEtiapine  100 mg Oral QHS  . spironolactone  12.5 mg Oral Daily  . thiamine  100 mg Per Tube Daily   Continuous Infusions:   LOS: 17 days    Time spent: 25 minutes    Alberteen Sam, MD [facility] 03/01/2020, 2:35 PM     Please page though AMION or Epic secure chat:  For [employer], Higher education careers adviser

## 2020-03-01 NOTE — Progress Notes (Signed)
Physical Therapy Treatment Patient Details Name: Shawn Taylor MRN: [id number] DOB: [birth year] Today's Date: 03/01/2020    History of Present Illness Pt is a 63 yo male presented to ED in atrial flutter 02/13/20 s/p respiratory arrest followed by cardiac arrest, cardiogenic shock, pulmonary edema, severe metabolic acidosis, BLE DVT's concerning for possible pulmonary embolisms requiring mechanical intubation and levophed. Now extubated 02/21/20. PMH of HTN, migraines.    PT Comments    Ready for session.  Pt given increased time to get to EOB with rail.  Pt stopped several times to rest due to dizziness but it resolved each time with rest.  BP 148/88 in sitting.  Steady in sitting with distant supervision.  Stood x 2 with min/mod a +2.  Inc stool and care was provided in standing.  After seated rest, he is able to stand again and transfer to recliner at bedside.  General weakness noted which limited his ability to progress gait.  Cues to stand fully due to knees "sinking" during transfer but he is able to get safely to chair.  Remained up with legs down per his choice.  Discussed mobility with RN.    At this time, given mobility limitations and general weakness SNF remains appropriate but he demonstrated good effort to participate and thanked Probation officer for helping him.   Follow Up Recommendations  SNF     Equipment Recommendations  Rolling walker with 5" wheels    Recommendations for Other Services       Precautions / Restrictions Precautions Precautions: Fall Restrictions Weight Bearing Restrictions: No    Mobility  Bed Mobility Overal bed mobility: Needs Assistance Bed Mobility: Supine to Sit     Supine to sit: Min assist;Min guard     General bed mobility comments: increased time needed but little assist.  Transfers Overall transfer level: Needs assistance Equipment used: Rolling walker (2 wheeled) Transfers: Sit to/from Stand Sit to Stand: Min assist;Mod assist;+2 physical  assistance            Ambulation/Gait Ambulation/Gait assistance: Min assist;Mod assist;+2 physical assistance Gait Distance (Feet): 3 Feet Assistive device: Rolling walker (2 wheeled) Gait Pattern/deviations: Step-to pattern;Trunk flexed Gait velocity: decreased   General Gait Details: heavy reliance on walker with poor posture with verbal and tactile cues for safety and sequencing.   Stairs             Wheelchair Mobility    Modified Rankin (Stroke Patients Only)       Balance Overall balance assessment: Needs assistance Sitting-balance support: Feet supported Sitting balance-Leahy Scale: Good Sitting balance - Comments: generally steady with supervision   Standing balance support: Bilateral upper extremity supported Standing balance-Leahy Scale: Poor Standing balance comment: heavy reliance on RW for support with general weakness throughout                            Cognition Arousal/Alertness: Awake/alert Behavior During Therapy: WFL for tasks assessed/performed Overall Cognitive Status: Within Functional Limits for tasks assessed                                        Exercises      General Comments        Pertinent Vitals/Pain Pain Assessment: No/denies pain    Home Living  Prior Function            PT Goals (current goals can now be found in the care plan section) Progress towards PT goals: Progressing toward goals    Frequency    Min 2X/week      PT Plan Current plan remains appropriate    Co-evaluation              AM-PAC PT "6 Clicks" Mobility   Outcome Measure  Help needed turning from your back to your side while in a flat bed without using bedrails?: A Little Help needed moving from lying on your back to sitting on the side of a flat bed without using bedrails?: A Little Help needed moving to and from a bed to a chair (including a wheelchair)?: A Lot Help  needed standing up from a chair using your arms (e.g., wheelchair or bedside chair)?: A Lot Help needed to walk in hospital room?: A Lot Help needed climbing 3-5 steps with a railing? : Total 6 Click Score: 13    End of Session Equipment Utilized During Treatment: Gait belt Activity Tolerance: Patient tolerated treatment well;Patient limited by fatigue Patient left: in chair;with call bell/phone within reach;with chair alarm set Nurse Communication: Mobility status       Time: 4580-9983 PT Time Calculation (min) (ACUTE ONLY): 24 min  Charges:  $Gait Training: 8-22 mins $Therapeutic Activity: 8-22 mins                    Danielle Dess, PTA 03/01/20, 12:35 PM

## 2020-03-02 ENCOUNTER — Inpatient Hospital Stay: Payer: Medicaid Other

## 2020-03-02 LAB — BASIC METABOLIC PANEL
Anion gap: 8 (ref 5–15)
BUN: 22 mg/dL (ref 8–23)
CO2: 22 mmol/L (ref 22–32)
Calcium: 8.6 mg/dL — ABNORMAL LOW (ref 8.9–10.3)
Chloride: 102 mmol/L (ref 98–111)
Creatinine, Ser: 1.02 mg/dL (ref 0.61–1.24)
GFR calc Af Amer: >60 mL/min (ref >60)
GFR calc non Af Amer: >60 mL/min (ref >60)
Glucose, Bld: 152 mg/dL — ABNORMAL HIGH (ref 70–99)
Potassium: 4.1 mmol/L (ref 3.5–5.1)
Sodium: 132 mmol/L — ABNORMAL LOW (ref 135–145)

## 2020-03-02 LAB — CBC
HCT: 35 % — ABNORMAL LOW (ref 39.0–52.0)
Hemoglobin: 11.7 g/dL — ABNORMAL LOW (ref 13.0–17.0)
MCH: 29.7 pg (ref 26.0–34.0)
MCHC: 33.4 g/dL (ref 30.0–36.0)
MCV: 88.8 fL (ref 80.0–100.0)
Platelets: 91 10*3/uL — ABNORMAL LOW (ref 150–400)
RBC: 3.94 MIL/uL — ABNORMAL LOW (ref 4.22–5.81)
RDW: 15.1 % (ref 11.5–15.5)
WBC: 9.8 10*3/uL (ref 4.0–10.5)
nRBC: 0 % (ref 0.0–0.2)

## 2020-03-02 LAB — TROPONIN I (HIGH SENSITIVITY)
Troponin I (High Sensitivity): 18 ng/L — ABNORMAL HIGH (ref <18)
Troponin I (High Sensitivity): 17 ng/L (ref <18)

## 2020-03-02 LAB — MAGNESIUM: Magnesium: 1.8 mg/dL (ref 1.7–2.4)

## 2020-03-02 LAB — PHOSPHORUS: Phosphorus: 3.5 mg/dL (ref 2.5–4.6)

## 2020-03-02 MED ORDER — OXYCODONE HCL 5 MG PO TABS: 5.0000 mg | ORAL_TABLET | Freq: Four times a day (QID) | ORAL | Status: DC | PRN

## 2020-03-02 NOTE — Progress Notes (Signed)
SLP Cancellation Note  Patient Details Name: Shawn Taylor MRN: [id number] DOB: 1956-01-26   Cancelled treatment:       Reason Eval/Treat Not Completed: (chart reviewed). Reviewed MD notes; consulted NSG re: pt's status today. Noted he needed encouragement to work w/ PT today - "initially refusing therapy and refusing to get OOB due to being comfortable in the bed". Pt has a baseline of Esophageal phase dysmotility and is on a PPI. He has stated prior to this admission "meats give me trouble". Pt is tolerating Pills Whole w/ water per NSG, and his meals w/ no significant issues though he continues to eat/drink in bed which can increase risk for aspiration episodes to occur as well as Esophageal phase dysmotility.  Discussed this w/ NSG w/ the recommendation that pt get out of bed for each meal -- this will reduce aspiration risk and REFLUX risk. Recommended strict REFLUX precautions. Pt is currently on a mech soft foods diet w/ thin liquids - pt requested an upgrade to solid foods vs minced/pureed d/t wish for "regular" foods again. Recommend f/u w/ GI for assessment of Esophageal phase dysmotility; management/tx of such. NSG to reconsult ST services if any new needs arise. NSG agreed and will work on encouraging pt to eat his meals in the chair, and stay sitting upright for 30-45 mins post meals for REFLUX precautions.     Jerilynn Som, MS, CCC-SLP Shawn Taylor 03/02/2020, 4:25 PM

## 2020-03-02 NOTE — Progress Notes (Signed)
BP 138/75   Pulse 86   Temp 98.2 F (36.8 C) (Oral)   Resp 18   Ht 5\' 11"  (1.803 m)   Wt 79.3 kg   SpO2 98%   BMI 24.39 kg/m   Patient called out to nursing due to sudden new right sided sharp pleuritic nonexertional chest pain tonight.  ECG showed anterior TW changes, not new, no St segment changes.  No improvement with nitro.  CXR showed some interstitial changes, nonspecific.  Patient's vitals normal, as above.  Initial troponin 18, will repeat, but overall suspicion for ACS or PE exceedingly low.

## 2020-03-02 NOTE — TOC Progression Note (Addendum)
Transition of Care [facility]) - Progression Note    Patient Details  Name: Shawn Taylor MRN: [id number] Date of Birth: 1955-12-14  Transition of [facility]) CM/SW Contact  Margarito Liner, LCSW Phone Number: 03/02/2020, 9:02 AM  Clinical Narrative:  Financial counselor completed Medicaid screening with patient's aunt yesterday and she emailed paperwork that patient has to sign before submitted the application. Patient would prefer to wait until his aunt arrives around 10:00 before signing it. Will follow up around 10:00.   10:11 am: Called patient's aunt to update her. She will get ready and be at the hospital in about an hour.  11:31 am: Paperwork completed with patient and his aunt. Provided them with a copy. Emailed to Artist. She will submit application today.  Expected Discharge Plan and Services                                                 Social Determinants of Health (SDOH) Interventions    Readmission Risk Interventions No flowsheet data found.

## 2020-03-02 NOTE — Progress Notes (Signed)
Physical Therapy Treatment Patient Details Name: Shawn Taylor MRN: [id number] DOB: Aug 09, 1956 Today's Date: 03/02/2020    History of Present Illness Pt is a 64 yo male presented to ED in atrial flutter 02/13/20 s/p respiratory arrest followed by cardiac arrest, cardiogenic shock, pulmonary edema, severe metabolic acidosis, BLE DVT's concerning for possible pulmonary embolisms requiring mechanical intubation and levophed. Now extubated 02/21/20. PMH of HTN, migraines.    PT Comments    Pt received resting in bed. Pt initially refusing therapy and refusing to get OOB due to being comfortable in the bed. Pt with no complaints of pain. Pt educated on importance of getting up and moving and getting out of bed. Pt continuing to politely decline OOB mobility at this time stating later in the day he would get to recliner. Pt reporting yesterday when he was alone in recliner he got really dizzy and it scared him. Pt agreeable to bed level therex for strengthening. UE and LE therex performed in supine with min cuing for correct performance and min cuing for redirection to task. Pt weak and fatigues quickly. Pt will benefit from cont acute PT for further mobility progression. STR remains appropriate.    Follow Up Recommendations  SNF     Equipment Recommendations  Rolling walker with 5" wheels    Recommendations for Other Services       Precautions / Restrictions Precautions Precautions: Fall Restrictions Weight Bearing Restrictions: No    Mobility  Bed Mobility               General bed mobility comments: pt refusing  Transfers                 General transfer comment: pt refusing  Ambulation/Gait                 Stairs             Wheelchair Mobility    Modified Rankin (Stroke Patients Only)       Balance                                            Cognition Arousal/Alertness: Awake/alert Behavior During Therapy: WFL for tasks  assessed/performed Overall Cognitive Status: Within Functional Limits for tasks assessed                                        Exercises Total Joint Exercises Ankle Circles/Pumps: AROM;Both;10 reps Towel Squeeze: AROM;Both;10 reps Short Arc Quad: AROM;Both;10 reps Heel Slides: AROM;Both;10 reps Hip ABduction/ADduction: AROM;Both;10 reps Straight Leg Raises: AROM;Both;10 reps Other Exercises Other Exercises: pt performed x2 rounds of punches bil UE x10 reps, x2 rounds of undercut punches bil UE x10 reps each Other Exercises: attempted bridges x2-3 reps with pt unable to clear bottom and then pt stating too fatigued to continue    General Comments        Pertinent Vitals/Pain Pain Assessment: No/denies pain    Home Living                      Prior Function            PT Goals (current goals can now be found in the care plan section) Progress towards PT goals: Progressing toward goals    Frequency  Min 2X/week      PT Plan Current plan remains appropriate    Co-evaluation              AM-PAC PT "6 Clicks" Mobility   Outcome Measure  Help needed turning from your back to your side while in a flat bed without using bedrails?: A Little Help needed moving from lying on your back to sitting on the side of a flat bed without using bedrails?: A Little Help needed moving to and from a bed to a chair (including a wheelchair)?: A Lot Help needed standing up from a chair using your arms (e.g., wheelchair or bedside chair)?: A Lot Help needed to walk in hospital room?: A Lot Help needed climbing 3-5 steps with a railing? : Total 6 Click Score: 13    End of Session   Activity Tolerance: Patient tolerated treatment well Patient left: in bed;with call bell/phone within reach Nurse Communication: Mobility status PT Visit Diagnosis: Other abnormalities of gait and mobility (R26.89);Unsteadiness on feet (R26.81);Muscle weakness (generalized)  (M62.81)     Time: 3559-7416 PT Time Calculation (min) (ACUTE ONLY): 14 min  Charges:  $Therapeutic Exercise: 8-22 mins                     Sol Odor PT, DPT 11:12 AM,03/02/20    Mavis Gravelle Drucilla Chalet 03/02/2020, 11:09 AM

## 2020-03-02 NOTE — NC FL2 (Signed)
MEDICAID FL2 LEVEL OF CARE SCREENING TOOL     IDENTIFICATION  Patient Name: Shawn Taylor Birthdate: 1956-07-16 Sex: male Admission Date (Current Location): 02/13/2020  [place] and [place] Number:  Chiropodist and Address:  [facility], [address] [place], [place] [address]      Provider Number: [id number]  Attending Physician Name and Address:  Alberteen Sam, *  Relative Name and Phone Number:       Current Level of Care: Hospital Recommended Level of Care: Skilled Nursing Facility Prior Approval Number:    Date Approved/Denied:   PASRR Number: [id number]  Discharge Plan: SNF    Current Diagnoses: Patient Active Problem List   Diagnosis Date Noted  . Acute systolic CHF (congestive heart failure) (HCC) 02/26/2020  . DVT of lower extremity, bilateral (HCC) 02/26/2020  . Dysphagia 02/26/2020  . Thrombocytopenia (HCC) 02/26/2020  . Pulmonary edema 02/26/2020  . Cocaine abuse (HCC) 02/26/2020  . Alcohol abuse 02/26/2020  . AKI (acute kidney injury) (HCC) 02/26/2020  . Hyponatremia 02/26/2020  . Cardiac arrest, cause unspecified (HCC) 02/16/2020  . Liver failure (HCC) 02/16/2020  . Acute hypoxemic respiratory failure (HCC) 02/16/2020  . Atrial flutter (HCC) 02/13/2020    Orientation RESPIRATION BLADDER Height & Weight     Self, Time, Situation, Place  Normal(No bipap since 3/30.) Incontinent, External catheter Weight: 174 lb 14.4 oz (79.3 kg) Height:  5\' 11"  (180.3 cm)  BEHAVIORAL SYMPTOMS/MOOD NEUROLOGICAL BOWEL NUTRITION STATUS  (None) (None) Incontinent Diet(DYS 3.)  AMBULATORY STATUS COMMUNICATION OF NEEDS Skin   Extensive Assist Verbally Normal                       Personal Care Assistance Level of Assistance  Bathing, Feeding, Dressing Bathing Assistance: Limited assistance Feeding assistance: Limited assistance Dressing Assistance: Limited assistance     Functional Limitations Info   Sight, Hearing, Speech Sight Info: Adequate Hearing Info: Adequate Speech Info: Adequate    SPECIAL CARE FACTORS FREQUENCY  PT (By licensed PT), OT (By licensed OT), Speech therapy     PT Frequency: 5 x week OT Frequency: 5 x week     Speech Therapy Frequency: 5 x week      Contractures Contractures Info: Not present    Additional Factors Info  Code Status, Allergies Code Status Info: Full code Allergies Info: NKDA           Current Medications (03/02/2020):  This is the current hospital active medication list Current Facility-Administered Medications  Medication Dose Route Frequency Provider Last Rate Last Admin  . acetaminophen (TYLENOL) tablet 650 mg  650 mg Oral Q6H PRN 05/02/2020, RPH   650 mg at 02/29/20 04/30/20   Or  . acetaminophen (TYLENOL) suppository 650 mg  650 mg Rectal Q6H PRN 6270, RPH      . alum & mag hydroxide-simeth (MAALOX/MYLANTA) 200-200-20 MG/5ML suspension 15 mL  15 mL Oral Q6H PRN 06-02-2001, MD   15 mL at 02/29/20 1859  . apixaban (ELIQUIS) tablet 5 mg  5 mg Oral BID 04/30/20, MD   5 mg at 03/02/20 0926  . atorvastatin (LIPITOR) tablet 40 mg  40 mg Oral Q2200 05/02/20, MD   40 mg at 03/01/20 2124  . calcium carbonate (TUMS - dosed in mg elemental calcium) chewable tablet 200 mg of elemental calcium  1 tablet Oral TID PRN 2125, MD      . docusate sodium (COLACE)  capsule 100 mg  100 mg Oral BID PRN Enzo Bi, MD      . feeding supplement (ENSURE ENLIVE) (ENSURE ENLIVE) liquid 237 mL  237 mL Oral TID BM Enzo Bi, MD   237 mL at 16/10/96 0454  . folic acid (FOLVITE) tablet 1 mg  1 mg Oral Daily Benita Gutter, RPH   1 mg at 03/02/20 0981  . furosemide (LASIX) tablet 40 mg  40 mg Oral Daily Enzo Bi, MD   40 mg at 03/02/20 0926  . guaiFENesin-dextromethorphan (ROBITUSSIN DM) 100-10 MG/5ML syrup 10 mL  10 mL Oral Q6H PRN Enzo Bi, MD   10 mL at 03/01/20 0909  . haloperidol lactate (HALDOL) injection 5 mg  5 mg Intravenous Q6H PRN  Enzo Bi, MD   5 mg at 02/28/20 0157  . isosorbide-hydrALAZINE (BIDIL) 20-37.5 MG per tablet 1 tablet  1 tablet Oral TID Ottie Glazier, MD   1 tablet at 03/02/20 0927  . lisinopril (ZESTRIL) tablet 2.5 mg  2.5 mg Oral Daily Ottie Glazier, MD   2.5 mg at 03/02/20 0932  . loperamide (IMODIUM) capsule 2 mg  2 mg Oral PRN Benita Gutter, RPH      . MEDLINE mouth rinse  15 mL Mouth Rinse q12n4p Ottie Glazier, MD   15 mL at 03/02/20 1214  . metoprolol tartrate (LOPRESSOR) tablet 12.5 mg  12.5 mg Oral BID Ottie Glazier, MD   12.5 mg at 03/02/20 0926  . multivitamin with minerals tablet 1 tablet  1 tablet Per Tube Daily Benita Gutter, RPH   1 tablet at 03/02/20 1914  . nitroGLYCERIN (NITROSTAT) SL tablet 0.4 mg  0.4 mg Sublingual Q5 min PRN Darel Hong D, NP   0.4 mg at 02/27/20 1148  . ondansetron (ZOFRAN) injection 4 mg  4 mg Intravenous Q6H PRN Enzo Bi, MD      . ondansetron (ZOFRAN-ODT) disintegrating tablet 4 mg  4 mg Oral Q8H PRN Enzo Bi, MD      . pantoprazole (PROTONIX) EC tablet 40 mg  40 mg Oral Daily Ottie Glazier, MD   40 mg at 03/02/20 0926  . polyethylene glycol (MIRALAX / GLYCOLAX) packet 17 g  17 g Oral BID PRN Enzo Bi, MD      . QUEtiapine (SEROQUEL) tablet 100 mg  100 mg Oral QHS Enzo Bi, MD   100 mg at 03/01/20 2123  . spironolactone (ALDACTONE) tablet 12.5 mg  12.5 mg Oral Daily Enzo Bi, MD   12.5 mg at 03/02/20 0927  . thiamine tablet 100 mg  100 mg Per Tube Daily Dallie Piles, RPH   100 mg at 03/02/20 7829  . traZODone (DESYREL) tablet 50 mg  50 mg Oral QHS PRN Bradly Bienenstock, NP   50 mg at 02/23/20 0132     Discharge Medications: Please see discharge summary for a list of discharge medications.  Relevant Imaging Results:  Relevant Lab Results:   Additional Information SS#: [id number]. Medicaid application submitted today (4/9). Gets social security and food stamps. Plan is to return home after rehab.  Candie Chroman, LCSW

## 2020-03-02 NOTE — Progress Notes (Signed)
PROGRESS NOTE    Acheron Sugg  UUV:[id number] DOB: Mar 03, 1956 DOA: 02/13/2020 PCP: Patient, No Pcp Per      Brief Narrative:  Mr. Eatherly is a 64 y.o. M with hx HTN, substance use disorder who presented with SOB progressive over 3 weeks, palpitations.  In the ER, noted to be in new Aflutter with RVR.  Started on IV diltiazem drip and Lasix.  Cardiology consulted and admitted to hospitalist service.   Subsequently developed cardiac arrest leading to respiratory failure and intubation as well as noted to have bilateral DVT.  Later found to have severely reduced EF 20%.    Also did develop significant thrombocytopenia post heparin infusion. Patient was switched to argatroban with pharmacy consult although HIT serologies negative.    Assessment & Plan:  Atrial flutter, typical with RVR Present on arrival, now resolved. -Continue Eliquis -Continue metoprolol  Acute on chronic systolic CHF Hypertension EF 20%.  Appears euvolemic. -Continue atorvastatin, furosemide, BiDil, lisinopril, metoprolol, spironolactone  Bilateral DVT No evidence of compartment syndrome or post-VTE phlebitis -Continue Eliquis  Delirium Resolved -Continue Seroquel  Dysphagia Improving  Chest pain, ACS ruled out  Normocytic anemia Likely from phlebotomy, position, critical illness. Stable  Thrombocytopenia Improving        Disposition: The patient was admitted with atrial flutter, subsequently developed cardiac arrest, acute respiratory failure, cardiogenic shock.    At baseline, he is completely independent for all self-cares, but at present can only stand for a few moments, is unable to walk, and will require substantial rehab to return to his prior level of function.   We will continue to work towards a safe disposition, which we have not developed yet.       MDM: The below labs and imaging reports reviewed and summarized above.  Medication management as above.   DVT  prophylaxis: N/A on Eliquis Code Status: FULL Family Communication: Wife at bedside    Consultants:     Procedures:     Antimicrobials:      Culture data:              Subjective: Patient with no new fever, sputum, confusion, orthopnea, dyspnea, chest pain.  He has a rattling cough, but no other respiratory symptoms.  He is generally weak and tired.        Objective: Vitals:   03/01/20 2024 03/02/20 0433 03/02/20 0925 03/02/20 1204  BP: (!) 145/81 129/88 117/68 117/66  Pulse: 85 81 83 84  Resp: 16 16    Temp: 97.9 F (36.6 C) 98.4 F (36.9 C)  (!) 97.5 F (36.4 C)  TempSrc: Oral Oral  Oral  SpO2: 97% 99% 97% 100%  Weight:  79.3 kg    Height:        Intake/Output Summary (Last 24 hours) at 03/02/2020 1551 Last data filed at 03/02/2020 1300 Gross per 24 hour  Intake 600 ml  Output 1650 ml  Net -1050 ml   Filed Weights   02/29/20 0430 03/01/20 0448 03/02/20 0433  Weight: 77.4 kg 78.3 kg 79.3 kg    Examination: General appearance: Adult male, sitting up in bed, interactive, no acute distress HEENT: Anicteric, dry and pink, lids and lashes normal, no nasal deformity, discharge, or epistaxis.  Dentition in good repair, lips dry, no oral lesions, oropharynx hydrated, hearing normal. Skin: Warm and dry, no suspicious rashes or lesions Cardiac: Regular rate and rhythm, systolic ejection murmur noted, normal JVP. Respiratory: Wet cough sounding, but normal respiratory rate, no rales or wheezing on  exam Abdomen: Abdomen soft, no tenderness palpation or guarding, no ascites or distention. MSK: No deformities or effusions  neuro: Awake and alert, extraocular movements intact, moves all extremities with generalized weakness, speech fluent Psych: Sensorium intact responding questions, attention normal, affect normal, judgment insight appears impaired.  Data Reviewed: I have personally reviewed following labs and imaging studies:  CBC: Recent Labs  Lab  2020/03/15 0548 02/28/20 0515 02/29/20 0532 03/01/20 0526 03/02/20 0451  WBC 13.9* 10.3 9.8 9.2 9.8  HGB 13.6 12.5* 12.1* 11.6* 11.7*  HCT 41.7 38.9* 37.8* 35.6* 35.0*  MCV 90.5 90.9 90.9 90.4 88.8  PLT 65* 61* 70* 79* 91*   Basic Metabolic Panel: Recent Labs  Lab 03-15-2020 0548 02/28/20 0515 02/29/20 0532 03/01/20 0526 03/02/20 0451  NA 140 141 136 133* 132*  K 3.7 3.1* 3.8 4.1 4.1  CL 105 105 100 103 102  CO2 26 25 27 24 22   GLUCOSE 131* 125* 125* 167* 152*  BUN 25* 23 27* 25* 22  CREATININE 1.02 1.14 1.32* 1.07 1.02  CALCIUM 8.3* 8.2* 8.3* 8.4* 8.6*  MG 1.8 1.5* 1.7 1.6* 1.8  PHOS 2.1* 3.6 3.2 2.9 3.5   GFR: Estimated Creatinine Clearance: 79 mL/min (by C-G formula based on SCr of 1.02 mg/dL). Liver Function Tests: No results for input(s): AST, ALT, ALKPHOS, BILITOT, PROT, ALBUMIN in the last 168 hours. No results for input(s): LIPASE, AMYLASE in the last 168 hours. No results for input(s): AMMONIA in the last 168 hours. Coagulation Profile: No results for input(s): INR, PROTIME in the last 168 hours. Cardiac Enzymes: No results for input(s): CKTOTAL, CKMB, CKMBINDEX, TROPONINI in the last 168 hours. BNP (last 3 results) No results for input(s): PROBNP in the last 8760 hours. HbA1C: No results for input(s): HGBA1C in the last 72 hours. CBG: Recent Labs  Lab 02/25/20 1219 02/25/20 1608 02/26/20 0822 02/26/20 1226 02/26/20 1608  GLUCAP 128* 108* 124* 242* 150*   Lipid Profile: No results for input(s): CHOL, HDL, LDLCALC, TRIG, CHOLHDL, LDLDIRECT in the last 72 hours. Thyroid Function Tests: No results for input(s): TSH, T4TOTAL, FREET4, T3FREE, THYROIDAB in the last 72 hours. Anemia Panel: No results for input(s): VITAMINB12, FOLATE, FERRITIN, TIBC, IRON, RETICCTPCT in the last 72 hours. Urine analysis:    Component Value Date/Time   COLORURINE YELLOW (A) 11/23/2017 1549   APPEARANCEUR CLEAR (A) 11/23/2017 1549   LABSPEC 1.005 11/23/2017 1549   PHURINE  5.0 11/23/2017 1549   GLUCOSEU NEGATIVE 11/23/2017 1549   HGBUR SMALL (A) 11/23/2017 1549   BILIRUBINUR NEGATIVE 11/23/2017 1549   KETONESUR NEGATIVE 11/23/2017 1549   PROTEINUR NEGATIVE 11/23/2017 1549   NITRITE NEGATIVE 11/23/2017 1549   LEUKOCYTESUR NEGATIVE 11/23/2017 1549   Sepsis Labs: @LABRCNTIP (procalcitonin:4,lacticacidven:4)  )No results found for this or any previous visit (from the past 240 hour(s)).       Radiology Studies: No results found.      Scheduled Meds: . apixaban  5 mg Oral BID  . atorvastatin  40 mg Oral Q2200  . feeding supplement (ENSURE ENLIVE)  237 mL Oral TID BM  . folic acid  1 mg Oral Daily  . furosemide  40 mg Oral Daily  . isosorbide-hydrALAZINE  1 tablet Oral TID  . lisinopril  2.5 mg Oral Daily  . mouth rinse  15 mL Mouth Rinse q12n4p  . metoprolol tartrate  12.5 mg Oral BID  . multivitamin with minerals  1 tablet Per Tube Daily  . pantoprazole  40 mg Oral Daily  .  QUEtiapine  100 mg Oral QHS  . spironolactone  12.5 mg Oral Daily  . thiamine  100 mg Per Tube Daily   Continuous Infusions:   LOS: 18 days    Time spent: 25 minutes    Alberteen Sam, MD [facility] 03/02/2020, 3:51 PM     Please page though AMION or Epic secure chat:  For [employer], Higher education careers adviser

## 2020-03-03 LAB — MAGNESIUM: Magnesium: 1.9 mg/dL (ref 1.7–2.4)

## 2020-03-03 LAB — BASIC METABOLIC PANEL
Anion gap: 6 (ref 5–15)
BUN: 23 mg/dL (ref 8–23)
CO2: 22 mmol/L (ref 22–32)
Calcium: 8.7 mg/dL — ABNORMAL LOW (ref 8.9–10.3)
Chloride: 106 mmol/L (ref 98–111)
Creatinine, Ser: 1.18 mg/dL (ref 0.61–1.24)
GFR calc Af Amer: >60 mL/min (ref >60)
GFR calc non Af Amer: >60 mL/min (ref >60)
Glucose, Bld: 114 mg/dL — ABNORMAL HIGH (ref 70–99)
Potassium: 3.9 mmol/L (ref 3.5–5.1)
Sodium: 134 mmol/L — ABNORMAL LOW (ref 135–145)

## 2020-03-03 LAB — CBC
HCT: 35.7 % — ABNORMAL LOW (ref 39.0–52.0)
Hemoglobin: 11.5 g/dL — ABNORMAL LOW (ref 13.0–17.0)
MCH: 29 pg (ref 26.0–34.0)
MCHC: 32.2 g/dL (ref 30.0–36.0)
MCV: 90.2 fL (ref 80.0–100.0)
Platelets: 119 10*3/uL — ABNORMAL LOW (ref 150–400)
RBC: 3.96 MIL/uL — ABNORMAL LOW (ref 4.22–5.81)
RDW: 15.2 % (ref 11.5–15.5)
WBC: 7.7 10*3/uL (ref 4.0–10.5)
nRBC: 0 % (ref 0.0–0.2)

## 2020-03-03 LAB — PHOSPHORUS: Phosphorus: 4.3 mg/dL (ref 2.5–4.6)

## 2020-03-03 MED ORDER — FUROSEMIDE 10 MG/ML IJ SOLN
40.0000 mg | Freq: Once | INTRAMUSCULAR | Status: AC
  Administered 2020-03-03: 40 mg via INTRAVENOUS
  Filled 2020-03-03: qty 4

## 2020-03-03 MED ORDER — FUROSEMIDE 40 MG PO TABS
40.0000 mg | ORAL_TABLET | Freq: Every day | ORAL | Status: DC
  Administered 2020-03-04 – 2020-03-07 (×4): 40 mg via ORAL
  Filled 2020-03-03 (×4): qty 1

## 2020-03-03 NOTE — Progress Notes (Signed)
PROGRESS NOTE    Shawn Taylor  UUV:[id number] DOB: July 09, 1956 DOA: 02/13/2020 PCP: Patient, No Pcp Per      Brief Narrative:  Shawn Taylor is a 64 y.o. M with hx HTN, substance use disorder who presented with SOB progressive over 3 weeks, palpitations.  In the ER, noted to be in new Aflutter with RVR.  Started on IV diltiazem drip and Lasix.  Cardiology consulted and admitted to hospitalist service.   Subsequently developed cardiac arrest leading to respiratory failure and intubation as well as noted to have bilateral DVT.  Later found to have severely reduced EF 20%.    Also did develop significant thrombocytopenia post heparin infusion. Patient was switched to argatroban with pharmacy consult although HIT serologies negative.           Assessment & Plan:  Atrial flutter, typical with RVR Present on arrival, now resolved. -Continue Eliquis -Continue metoprolol  Acute on chronic systolic CHF Hypertension EF 20%.  Appears fluid overloaded slightly by CXR yesterday. -Continue atorvastatin, BiDil, lisinopril, metoprolol, spironolactone -IV Lasix x1 then resume oral Lasix tomorrow  Chest pain  patient with one episode of right-sided chest pain yesterday after working with physical therapy on upper arm strength.  Follow-up chest x-ray, EKG were unremarkable.  Troponins negative.  Doubt PE.  Bilateral DVT No evidence of compartment syndrome or post-VTE phlebitis -Continue Eliquis  Delirium Resolved -Continue Seroquel  Dysphagia Improving  Chest pain, ACS ruled out  Normocytic anemia Likely from phlebotomy, position, critical illness. Stable  Thrombocytopenia Improving        Disposition: Status is: Inpatient  Remains inpatient appropriate because:Unsafe d/c plan   Dispo: The patient is from: Home              Anticipated d/c is to: SNF              Anticipated d/c date is: 2 days              Patient currently is medically stable to  d/c.              MDM: The below labs and imaging reports reviewed and summarized above.  Medication management as above.        DVT prophylaxis: N/A on Eliquis Code Status: FULL Family Communication:      Consultants:     Procedures:     Antimicrobials:      Culture data:              Subjective: Patient's chest pain is resolved.  He said no fever, sputum, confusion, dyspnea on exertion, chest discomfort.  He still has some rattling cough.      Objective: Vitals:   03/03/20 0439 03/03/20 0500 03/03/20 1010 03/03/20 1240  BP: 129/77  129/75 (!) 146/83  Pulse: 73  84 84  Resp: 20  (!) 22 19  Temp: 97.7 F (36.5 C)  98.2 F (36.8 C) 97.7 F (36.5 C)  TempSrc: Oral  Oral   SpO2: 98%  98% 99%  Weight:  78.9 kg    Height:        Intake/Output Summary (Last 24 hours) at 03/03/2020 1451 Last data filed at 03/03/2020 1421 Gross per 24 hour  Intake 480 ml  Output 3000 ml  Net -2520 ml   Filed Weights   03/01/20 0448 03/02/20 0433 03/03/20 0500  Weight: 78.3 kg 79.3 kg 78.9 kg    Examination: General appearance: Adult male, well-nourished, sitting up in bed, interactive, no acute distress  HEENT: Anicteric, conjunctive are pink, lids and lashes normal.  No nasal discharge, epistaxis, or deformity.  Oropharynx moist, no oral lesions, hearing normal Skin: Skin warm and dry without suspicious rashes or lesions Cardiac: Regular rate and rhythm, JVP normal, systolic ejection murmur noted, no lower extremity edema Respiratory: No rales or wheezing.  Respiratory rate appears normal, no increased respiratory effort. Abdomen: Abdomen soft without tenderness palpation or guarding, no ascites or distention. MSK: Normal muscle bulk and tone, no deformities or effusions of the large joints. neuro: Awake and alert, extraocular movements intact, moves all extremities with severe generalized weakness, speech fluent. Psych: Psychomotor slowing noted,  but sensorium intact and responding to questions, and oriented to person, place, time, and situation, affect normal, judgment appear slightly impaired.           Data Reviewed: I have personally reviewed following labs and imaging studies:  CBC: Recent Labs  Lab 02/28/20 0515 02/29/20 0532 03/01/20 0526 03/02/20 0451 03/03/20 0740  WBC 10.3 9.8 9.2 9.8 7.7  HGB 12.5* 12.1* 11.6* 11.7* 11.5*  HCT 38.9* 37.8* 35.6* 35.0* 35.7*  MCV 90.9 90.9 90.4 88.8 90.2  PLT 61* 70* 79* 91* 119*   Basic Metabolic Panel: Recent Labs  Lab 02/28/20 0515 02/29/20 0532 03/01/20 0526 03/02/20 0451 03/03/20 0740  NA 141 136 133* 132* 134*  K 3.1* 3.8 4.1 4.1 3.9  CL 105 100 103 102 106  CO2 25 27 24 22 22   GLUCOSE 125* 125* 167* 152* 114*  BUN 23 27* 25* 22 23  CREATININE 1.14 1.32* 1.07 1.02 1.18  CALCIUM 8.2* 8.3* 8.4* 8.6* 8.7*  MG 1.5* 1.7 1.6* 1.8 1.9  PHOS 3.6 3.2 2.9 3.5 4.3   GFR: Estimated Creatinine Clearance: 68.2 mL/min (by C-G formula based on SCr of 1.18 mg/dL). Liver Function Tests: No results for input(s): AST, ALT, ALKPHOS, BILITOT, PROT, ALBUMIN in the last 168 hours. No results for input(s): LIPASE, AMYLASE in the last 168 hours. No results for input(s): AMMONIA in the last 168 hours. Coagulation Profile: No results for input(s): INR, PROTIME in the last 168 hours. Cardiac Enzymes: No results for input(s): CKTOTAL, CKMB, CKMBINDEX, TROPONINI in the last 168 hours. BNP (last 3 results) No results for input(s): PROBNP in the last 8760 hours. HbA1C: No results for input(s): HGBA1C in the last 72 hours. CBG: Recent Labs  Lab 02/25/20 1608 02/26/20 0822 02/26/20 1226 02/26/20 1608  GLUCAP 108* 124* 242* 150*   Lipid Profile: No results for input(s): CHOL, HDL, LDLCALC, TRIG, CHOLHDL, LDLDIRECT in the last 72 hours. Thyroid Function Tests: No results for input(s): TSH, T4TOTAL, FREET4, T3FREE, THYROIDAB in the last 72 hours. Anemia Panel: No results for  input(s): VITAMINB12, FOLATE, FERRITIN, TIBC, IRON, RETICCTPCT in the last 72 hours. Urine analysis:    Component Value Date/Time   COLORURINE YELLOW (A) 11/23/2017 1549   APPEARANCEUR CLEAR (A) 11/23/2017 1549   LABSPEC 1.005 11/23/2017 1549   PHURINE 5.0 11/23/2017 1549   GLUCOSEU NEGATIVE 11/23/2017 1549   HGBUR SMALL (A) 11/23/2017 1549   BILIRUBINUR NEGATIVE 11/23/2017 1549   KETONESUR NEGATIVE 11/23/2017 1549   PROTEINUR NEGATIVE 11/23/2017 1549   NITRITE NEGATIVE 11/23/2017 1549   LEUKOCYTESUR NEGATIVE 11/23/2017 1549   Sepsis Labs: @LABRCNTIP (procalcitonin:4,lacticacidven:4)  )No results found for this or any previous visit (from the past 240 hour(s)).       Radiology Studies: DG Chest Port 1 View  Result Date: 03/02/2020 CLINICAL DATA:  Chest pain, shortness of breath EXAM: PORTABLE CHEST 1  VIEW COMPARISON:  02/27/2020 FINDINGS: Cardiomegaly, vascular congestion. Mild interstitial prominence could reflect interstitial edema. Bibasilar atelectasis. Findings similar to prior study. No effusions or acute bony abnormality. IMPRESSION: Cardiomegaly with mild interstitial edema and bibasilar atelectasis. Electronically Signed   By: Rolm Baptise M.D.   On: 03/02/2020 18:00        Scheduled Meds: . apixaban  5 mg Oral BID  . atorvastatin  40 mg Oral Q2200  . feeding supplement (ENSURE ENLIVE)  237 mL Oral TID BM  . folic acid  1 mg Oral Daily  . [START ON 03/04/2020] furosemide  40 mg Oral Daily  . isosorbide-hydrALAZINE  1 tablet Oral TID  . lisinopril  2.5 mg Oral Daily  . mouth rinse  15 mL Mouth Rinse q12n4p  . metoprolol tartrate  12.5 mg Oral BID  . multivitamin with minerals  1 tablet Per Tube Daily  . pantoprazole  40 mg Oral Daily  . QUEtiapine  100 mg Oral QHS  . spironolactone  12.5 mg Oral Daily  . thiamine  100 mg Per Tube Daily   Continuous Infusions:   LOS: 19 days    Time spent: 25 minutes    Edwin Dada, MD [facility] 03/03/2020, 2:51 PM     Please page though [place] or Epic secure chat:  For [employer], Adult nurse

## 2020-03-04 LAB — PHOSPHORUS: Phosphorus: 3.8 mg/dL (ref 2.5–4.6)

## 2020-03-04 LAB — MAGNESIUM: Magnesium: 1.7 mg/dL (ref 1.7–2.4)

## 2020-03-04 NOTE — Progress Notes (Signed)
PROGRESS NOTE    Shawn Taylor  KNL:[id number] DOB: 1956-04-18 DOA: 02/13/2020 PCP: Patient, No Pcp Per      Brief Narrative:  Shawn Taylor is a 64 y.o. M with hx HTN, substance use disorder who presented with SOB progressive over 3 weeks, palpitations.  In the ER, noted to be in new Aflutter with RVR.  Started on IV diltiazem drip and Lasix.  Cardiology consulted and admitted to hospitalist service.   Subsequently developed cardiac arrest leading to respiratory failure and intubation as well as noted to have bilateral DVT.  Later found to have severely reduced EF 20%.    Also did develop significant thrombocytopenia post heparin infusion. Patient was switched to argatroban with pharmacy consult although HIT serologies negative.           Assessment & Plan:  Atrial flutter, typical with RVR Present on arrival, now resolved. -Continue Eliquis -Continue metoprolol  Acute on chronic systolic CHF Hypertension EF 20%.   Appears euvolemic -Continue atorvastatin, BiDil, lisinopril, metoprolol, spironolactone -Resume oral Lasix  Bilateral DVT No evidence of compartment syndrome or post-VTE phlebitis -Continue Eliquis  Delirium Resolved -Continue Seroquel  Dysphagia Improving  Chest pain, ACS ruled out  Normocytic anemia Likely from phlebotomy, position, critical illness. Stable  Thrombocytopenia Improving        Disposition: Status is: Inpatient  Remains inpatient appropriate because:Unsafe d/c plan   Dispo: The patient is from: Home              Anticipated d/c is to: SNF              Anticipated d/c date is: 1 day              Patient currently is medically stable to d/c.              MDM: The below labs and imaging reports reviewed and summarized above.  Medication management as above.       DVT prophylaxis: N/A on Eliquis Code Status: FULL Family Communication:      Consultants:     Procedures:     Antimicrobials:       Culture data:              Subjective: No chest pain.  He is having some dry cough, but this is improving.  No fever, sputum, confusion, vomiting, abdominal pain     Objective: Vitals:   03/03/20 2020 03/04/20 0500 03/04/20 0528 03/04/20 1118  BP: 129/72  123/71 140/70  Pulse: 85  78 83  Resp: 20  20 16   Temp: 98.8 F (37.1 C)  98.5 F (36.9 C) 99.5 F (37.5 C)  TempSrc: Oral  Oral Oral  SpO2: 98%  99%   Weight:  78.5 kg    Height:        Intake/Output Summary (Last 24 hours) at 03/04/2020 1517 Last data filed at 03/04/2020 0528 Gross per 24 hour  Intake 120 ml  Output 1500 ml  Net -1380 ml   Filed Weights   03/02/20 0433 03/03/20 0500 03/04/20 0500  Weight: 79.3 kg 78.9 kg 78.5 kg    Examination: General appearance:  adult male, alert and in no acute distress.   HEENT: Anicteric, conjunctiva pink, lids and lashes normal. No nasal deformity, discharge, epistaxis.  Lips moist, OP moist, no oral lesions.   Skin: Warm and dry.  No suspicious rashes or lesions. Cardiac: RRR, no murmurs appreciated.  No LE edema.    Respiratory: Normal respiratory rate and  rhythm.  CTAB without rales or wheezes. Abdomen: Abdomen soft.  No TTP. No ascites, distension, hepatosplenomegaly.   MSK: No deformities or effusions of the large joints of the upper or lower extremities bilaterally. Neuro: Awake and alert. Naming is grossly intact, and the patient's recall, recent and remote, as well as general fund of knowledge seem within normal limits.  Muscle tone normal, without fasciculations.  Moves all extremities equally and with normal coordination but generalized weakness.  Speech fluent.    Psych: Sensorium intact and responding to questions, attention normal. Affect normal.  Judgment and insight appear normal.           Data Reviewed: I have personally reviewed following labs and imaging studies:  CBC: Recent Labs  Lab 02/28/20 0515 02/29/20 0532 03/01/20 0526  03/02/20 0451 03/03/20 0740  WBC 10.3 9.8 9.2 9.8 7.7  HGB 12.5* 12.1* 11.6* 11.7* 11.5*  HCT 38.9* 37.8* 35.6* 35.0* 35.7*  MCV 90.9 90.9 90.4 88.8 90.2  PLT 61* 70* 79* 91* 119*   Basic Metabolic Panel: Recent Labs  Lab 02/28/20 0515 02/28/20 0515 02/29/20 0532 03/01/20 0526 03/02/20 0451 03/03/20 0740 03/04/20 0728  NA 141  --  136 133* 132* 134*  --   K 3.1*  --  3.8 4.1 4.1 3.9  --   CL 105  --  100 103 102 106  --   CO2 25  --  27 24 22 22   --   GLUCOSE 125*  --  125* 167* 152* 114*  --   BUN 23  --  27* 25* 22 23  --   CREATININE 1.14  --  1.32* 1.07 1.02 1.18  --   CALCIUM 8.2*  --  8.3* 8.4* 8.6* 8.7*  --   MG 1.5*   < > 1.7 1.6* 1.8 1.9 1.7  PHOS 3.6   < > 3.2 2.9 3.5 4.3 3.8   < > = values in this interval not displayed.   GFR: Estimated Creatinine Clearance: 68.2 mL/min (by C-G formula based on SCr of 1.18 mg/dL). Liver Function Tests: No results for input(s): AST, ALT, ALKPHOS, BILITOT, PROT, ALBUMIN in the last 168 hours. No results for input(s): LIPASE, AMYLASE in the last 168 hours. No results for input(s): AMMONIA in the last 168 hours. Coagulation Profile: No results for input(s): INR, PROTIME in the last 168 hours. Cardiac Enzymes: No results for input(s): CKTOTAL, CKMB, CKMBINDEX, TROPONINI in the last 168 hours. BNP (last 3 results) No results for input(s): PROBNP in the last 8760 hours. HbA1C: No results for input(s): HGBA1C in the last 72 hours. CBG: Recent Labs  Lab 02/26/20 1608  GLUCAP 150*   Lipid Profile: No results for input(s): CHOL, HDL, LDLCALC, TRIG, CHOLHDL, LDLDIRECT in the last 72 hours. Thyroid Function Tests: No results for input(s): TSH, T4TOTAL, FREET4, T3FREE, THYROIDAB in the last 72 hours. Anemia Panel: No results for input(s): VITAMINB12, FOLATE, FERRITIN, TIBC, IRON, RETICCTPCT in the last 72 hours. Urine analysis:    Component Value Date/Time   COLORURINE YELLOW (A) 11/23/2017 1549   APPEARANCEUR CLEAR (A)  11/23/2017 1549   LABSPEC 1.005 11/23/2017 1549   PHURINE 5.0 11/23/2017 1549   GLUCOSEU NEGATIVE 11/23/2017 1549   HGBUR SMALL (A) 11/23/2017 1549   BILIRUBINUR NEGATIVE 11/23/2017 1549   KETONESUR NEGATIVE 11/23/2017 1549   PROTEINUR NEGATIVE 11/23/2017 1549   NITRITE NEGATIVE 11/23/2017 1549   LEUKOCYTESUR NEGATIVE 11/23/2017 1549   Sepsis Labs: @LABRCNTIP (procalcitonin:4,lacticacidven:4)  )No results found for this or any  previous visit (from the past 240 hour(s)).       Radiology Studies: DG Chest Port 1 View  Result Date: 03/02/2020 CLINICAL DATA:  Chest pain, shortness of breath EXAM: PORTABLE CHEST 1 VIEW COMPARISON:  02/27/2020 FINDINGS: Cardiomegaly, vascular congestion. Mild interstitial prominence could reflect interstitial edema. Bibasilar atelectasis. Findings similar to prior study. No effusions or acute bony abnormality. IMPRESSION: Cardiomegaly with mild interstitial edema and bibasilar atelectasis. Electronically Signed   By: Rolm Baptise M.D.   On: 03/02/2020 18:00        Scheduled Meds: . apixaban  5 mg Oral BID  . atorvastatin  40 mg Oral Q2200  . feeding supplement (ENSURE ENLIVE)  237 mL Oral TID BM  . folic acid  1 mg Oral Daily  . furosemide  40 mg Oral Daily  . isosorbide-hydrALAZINE  1 tablet Oral TID  . lisinopril  2.5 mg Oral Daily  . mouth rinse  15 mL Mouth Rinse q12n4p  . metoprolol tartrate  12.5 mg Oral BID  . multivitamin with minerals  1 tablet Per Tube Daily  . pantoprazole  40 mg Oral Daily  . QUEtiapine  100 mg Oral QHS  . spironolactone  12.5 mg Oral Daily  . thiamine  100 mg Per Tube Daily   Continuous Infusions:   LOS: 20 days    Time spent: 25 minutes    Edwin Dada, MD [facility] 03/04/2020, 3:17 PM     Please page though [place] or Epic secure chat:  For [employer], Adult nurse

## 2020-03-04 NOTE — TOC Progression Note (Signed)
Transition of Care [facility]) - Progression Note    Patient Details  Name: Shawn Taylor MRN: [id number] Date of Birth: 02/22/1956  [facility]) CM/SW Contact  Nada Boozer Betha Shadix, LCSWA Phone Number: 03/04/2020, 8:43 AM  Clinical Narrative:     Patient currently does not have any active SNF bed offers. CSW will continue to follow and provide bed offers as they become available.    Expected Discharge Plan and Services    Social Determinants of Health (SDOH) Interventions    Readmission Risk Interventions No flowsheet data found.

## 2020-03-05 LAB — MAGNESIUM: Magnesium: 1.7 mg/dL (ref 1.7–2.4)

## 2020-03-05 LAB — PHOSPHORUS: Phosphorus: 4.3 mg/dL (ref 2.5–4.6)

## 2020-03-05 NOTE — Progress Notes (Signed)
[facility] visited pt. after learning he was still admitted, as follow-up from previous visits two weeks ago.  Pt. in bed and appears significantly improved to [facility]; pt. more able to talk than previously; says he feels better and is hoping to go home today.  Family members have not been in to visit as frequently recently, pt. said.  Pt. grateful for [facility] [place] [facility] follow-up; no needs expressed at this time.    03/05/20 1100  Clinical Encounter Type  Visited With Patient  Visit Type Follow-up;Psychological support;Social support  Stress Factors  Patient Stress Factors Major life changes;Health changes

## 2020-03-05 NOTE — Progress Notes (Signed)
PROGRESS NOTE    Zaelyn Barbary  TXM:[id number] DOB: August 01, 1956 DOA: 02/13/2020 PCP: Patient, No Pcp Per      Brief Narrative:  Mr. Heinkel is a 64 y.o. M with hx HTN, substance use disorder who presented with SOB progressive over 3 weeks, palpitations.  In the ER, noted to be in new Aflutter with RVR.  Started on IV diltiazem drip and Lasix.  Cardiology consulted and admitted to hospitalist service.   Subsequently developed cardiac arrest leading to respiratory failure and intubation as well as noted to have bilateral DVT.  Later found to have severely reduced EF 20%.    Also did develop significant thrombocytopenia post heparin infusion. Patient was switched to argatroban with pharmacy consult although HIT serologies negative.           Assessment & Plan:  Atrial flutter, typical with RVR Present on arrival.  HR controlled. -Continue Eliquis -Continue metoprolol  Acute on chronic systolic CHF Hypertension EF 20%.   Appears euvolemic -Continue atorvastatin, BiDil, lisinopril, metoprolol, spironolactone, Lasix  Bilateral DVT No evidence of compartment syndrome or post-VTE phlebitis -Continue Eliquis  Delirium Resolved -Continue Seroquel  Dysphagia Improving  Chest pain, ACS ruled out  Normocytic anemia Likely from phlebotomy, position, critical illness. Stable   Thrombocytopenia Improving        Disposition: Status is: Inpatient  Remains inpatient appropriate because:Unsafe d/c plan   Dispo: The patient is from: Home              Anticipated d/c is to: SNF              Anticipated d/c date is: 1 day              Patient currently is medically stable to d/c.              MDM: The below labs and imaging reports reviewed and summarized above.  Medication management as above.       DVT prophylaxis: N/A on Eliquis Code Status: FULL Family Communication:      Consultants:     Procedures:     Antimicrobials:       Culture data:              Subjective: No confusion, chest pain, sputum, fever, vomiting.  Still has some dry cough, but this is stable.     Objective: Vitals:   03/04/20 2122 03/04/20 2125 03/05/20 0459 03/05/20 0522  BP: 117/73 117/73  122/66  Pulse: 89 87  79  Resp:  20  20  Temp:  98.4 F (36.9 C)  97.7 F (36.5 C)  TempSrc:  Oral  Oral  SpO2:  99%  99%  Weight:   77 kg   Height:        Intake/Output Summary (Last 24 hours) at 03/05/2020 0941 Last data filed at 03/05/2020 0459 Gross per 24 hour  Intake -  Output 800 ml  Net -800 ml   Filed Weights   03/03/20 0500 03/04/20 0500 03/05/20 0459  Weight: 78.9 kg 78.5 kg 77 kg    Examination: General appearance: Well-nourished adult male, alert and in no acute distress.   Cardiac: RRR, no murmurs appreciated.  No LE edema.    Respiratory: Normal respiratory rate and rhythm.  CTAB without rales or wheezes. Abdomen: Abdomen soft.  No tenderness palpation or guarding. No ascites, distension, hepatosplenomegaly.   Neuro: Awake and alert. Naming is grossly intact, and the patient's recall, recent and remote, as well as general fund  of knowledge seem within normal limits.  Muscle tone normal, without fasciculations.  Moves all extremities equally but with severe generalized weakness, difficulty standing without assistance.  Speech fluent.    Psych: Sensorium intact and responding to questions, attention normal. Affect pleasant.  Judgment and insight appear mildly impaired.            Data Reviewed: I have personally reviewed following labs and imaging studies:  CBC: Recent Labs  Lab 02/28/20 0515 02/29/20 0532 03/01/20 0526 03/02/20 0451 03/03/20 0740  WBC 10.3 9.8 9.2 9.8 7.7  HGB 12.5* 12.1* 11.6* 11.7* 11.5*  HCT 38.9* 37.8* 35.6* 35.0* 35.7*  MCV 90.9 90.9 90.4 88.8 90.2  PLT 61* 70* 79* 91* 951*   Basic Metabolic Panel: Recent Labs  Lab 02/28/20 0515 02/28/20 0515 02/29/20 0532 02/29/20  0532 03/01/20 0526 03/02/20 0451 03/03/20 0740 03/04/20 0728 03/05/20 0540  NA 141  --  136  --  133* 132* 134*  --   --   K 3.1*  --  3.8  --  4.1 4.1 3.9  --   --   CL 105  --  100  --  103 102 106  --   --   CO2 25  --  27  --  24 22 22   --   --   GLUCOSE 125*  --  125*  --  167* 152* 114*  --   --   BUN 23  --  27*  --  25* 22 23  --   --   CREATININE 1.14  --  1.32*  --  1.07 1.02 1.18  --   --   CALCIUM 8.2*  --  8.3*  --  8.4* 8.6* 8.7*  --   --   MG 1.5*   < > 1.7   < > 1.6* 1.8 1.9 1.7 1.7  PHOS 3.6   < > 3.2   < > 2.9 3.5 4.3 3.8 4.3   < > = values in this interval not displayed.   GFR: Estimated Creatinine Clearance: 68.2 mL/min (by C-G formula based on SCr of 1.18 mg/dL). Liver Function Tests: No results for input(s): AST, ALT, ALKPHOS, BILITOT, PROT, ALBUMIN in the last 168 hours. No results for input(s): LIPASE, AMYLASE in the last 168 hours. No results for input(s): AMMONIA in the last 168 hours. Coagulation Profile: No results for input(s): INR, PROTIME in the last 168 hours. Cardiac Enzymes: No results for input(s): CKTOTAL, CKMB, CKMBINDEX, TROPONINI in the last 168 hours. BNP (last 3 results) No results for input(s): PROBNP in the last 8760 hours. HbA1C: No results for input(s): HGBA1C in the last 72 hours. CBG: No results for input(s): GLUCAP in the last 168 hours. Lipid Profile: No results for input(s): CHOL, HDL, LDLCALC, TRIG, CHOLHDL, LDLDIRECT in the last 72 hours. Thyroid Function Tests: No results for input(s): TSH, T4TOTAL, FREET4, T3FREE, THYROIDAB in the last 72 hours. Anemia Panel: No results for input(s): VITAMINB12, FOLATE, FERRITIN, TIBC, IRON, RETICCTPCT in the last 72 hours. Urine analysis:    Component Value Date/Time   COLORURINE YELLOW (A) 11/23/2017 1549   APPEARANCEUR CLEAR (A) 11/23/2017 1549   LABSPEC 1.005 11/23/2017 1549   PHURINE 5.0 11/23/2017 1549   GLUCOSEU NEGATIVE 11/23/2017 1549   HGBUR SMALL (A) 11/23/2017 1549    BILIRUBINUR NEGATIVE 11/23/2017 1549   KETONESUR NEGATIVE 11/23/2017 1549   PROTEINUR NEGATIVE 11/23/2017 1549   NITRITE NEGATIVE 11/23/2017 1549   LEUKOCYTESUR NEGATIVE 11/23/2017 1549   Sepsis  Labs: @LABRCNTIP (procalcitonin:4,lacticacidven:4)  )No results found for this or any previous visit (from the past 240 hour(s)).       Radiology Studies: No results found.      Scheduled Meds: . apixaban  5 mg Oral BID  . atorvastatin  40 mg Oral Q2200  . feeding supplement (ENSURE ENLIVE)  237 mL Oral TID BM  . folic acid  1 mg Oral Daily  . furosemide  40 mg Oral Daily  . isosorbide-hydrALAZINE  1 tablet Oral TID  . lisinopril  2.5 mg Oral Daily  . mouth rinse  15 mL Mouth Rinse q12n4p  . metoprolol tartrate  12.5 mg Oral BID  . multivitamin with minerals  1 tablet Per Tube Daily  . pantoprazole  40 mg Oral Daily  . QUEtiapine  100 mg Oral QHS  . spironolactone  12.5 mg Oral Daily  . thiamine  100 mg Per Tube Daily   Continuous Infusions:   LOS: 21 days    Time spent: 25 minutes    , MD [facility] 03/05/2020, 9:41 AM     Please page though [place] or Epic secure chat:  For 05/05/2020, [employer]

## 2020-03-05 NOTE — TOC Progression Note (Addendum)
Transition of Care [facility]) - Progression Note    Patient Details  Name: Shawn Taylor MRN: [id number] Date of Birth: 06-21-1956  Transition of Care [facility]) CM/SW Contact  Candie Chroman, LCSW Phone Number: 03/05/2020, 11:07 AM  Clinical Narrative: No bed offers at this time. Asked [facility] admissions coordinator to review.    2:36 pm: Patient just walked 320 feet min guard in PT. CSW met with patient and his aunt at bedside. They were both very excited about his progress. CSW did let them know that he no longer qualifies for an LOG and we could start discussed charity home health. Patient lives home alone. No family can stay with him and he is not able to stay with them due to work schedules. CSW encouraged patient and his aunt to discuss setting up a schedule for family to at least call and check on him throughout the day. Aunt said his daughter would have to set that up because she is his HCPOA. CSW left daughter a Advertising account executive.  Expected Discharge Plan and Services                                                 Social Determinants of Health (SDOH) Interventions    Readmission Risk Interventions No flowsheet data found.

## 2020-03-05 NOTE — Progress Notes (Signed)
Physical Therapy Treatment Patient Details Name: Shawn Taylor MRN: [id number] DOB: May 04, 1956 Today's Date: 03/05/2020    History of Present Illness Pt is a 64 yo male presented to ED in atrial flutter 02/13/20 s/p respiratory arrest followed by cardiac arrest, cardiogenic shock, pulmonary edema, severe metabolic acidosis, BLE DVT's concerning for possible pulmonary embolisms requiring mechanical intubation and levophed. Now extubated 02/21/20. PMH of HTN, migraines.    PT Comments    Pt excited to participate today wanting to walk.  EOB wit supervision.  Stood to [employer] with min a x 1 with verbal cues for hand placements.  Initially unsteady but once up more confident.  He requests commode.  Sat to obtain commode then transferred with min guard x 2.  Large soft BM.  He stands and transfers to recliner with min a x 1.  To hallway for gait.  He is able to stand and progress gait today 2 laps around unit with RW and min a/guard x 1 and recliner follow for safety as he had yet to ambulate since admission.  He has no LOB or buckling but global weakness in noted.  He denies fatigue and begins dancing in his chair.  Returned to room to rest with Aunt visiting.  Pt has made excellent progress today.  While weakness continues, pt is steady and safe with gait.  Requested OT eval from RN to assist with discharge planning in anticipation pt will be able to discharge home vs SNF given improvements.  Will schedule tomorrow for PT to increase mobility as insurance is a barrier for SNF per notes.  Encouraged pt to see if he can stay with a family member or have someone stay with him for assist and safety upon discharge until her returns to PLOF.   Follow Up Recommendations  Supervision for mobility/OOB;Home health PT     Equipment Recommendations  Rolling walker with 5" wheels    Recommendations for Other Services       Precautions / Restrictions Precautions Precautions: Fall Restrictions Weight Bearing  Restrictions: No    Mobility  Bed Mobility Overal bed mobility: Needs Assistance Bed Mobility: Supine to Sit     Supine to sit: Supervision        Transfers Overall transfer level: Needs assistance Equipment used: Rolling walker (2 wheeled) Transfers: Sit to/from Stand Sit to Stand: Min assist         General transfer comment: verbal cues for hand placements  Ambulation/Gait Ambulation/Gait assistance: Min guard;+2 safety/equipment Gait Distance (Feet): 320 Feet Assistive device: Rolling walker (2 wheeled) Gait Pattern/deviations: Step-through pattern;Decreased step length - right;Decreased step length - left;Trunk flexed Gait velocity: decreased       Stairs             Wheelchair Mobility    Modified Rankin (Stroke Patients Only)       Balance Overall balance assessment: Needs assistance Sitting-balance support: Feet supported Sitting balance-Leahy Scale: Good     Standing balance support: Bilateral upper extremity supported Standing balance-Leahy Scale: Fair Standing balance comment: reliance on walker but does quite well today                            Cognition Arousal/Alertness: Awake/alert Behavior During Therapy: WFL for tasks assessed/performed Overall Cognitive Status: Within Functional Limits for tasks assessed  Exercises      General Comments        Pertinent Vitals/Pain Pain Assessment: No/denies pain    Home Living                      Prior Function            PT Goals (current goals can now be found in the care plan section) Progress towards PT goals: Progressing toward goals    Frequency    Min 2X/week      PT Plan Discharge plan needs to be updated    Co-evaluation              AM-PAC PT "6 Clicks" Mobility   Outcome Measure  Help needed turning from your back to your side while in a flat bed without using bedrails?: A  Little Help needed moving from lying on your back to sitting on the side of a flat bed without using bedrails?: A Little Help needed moving to and from a bed to a chair (including a wheelchair)?: A Little Help needed standing up from a chair using your arms (e.g., wheelchair or bedside chair)?: A Little Help needed to walk in hospital room?: A Little Help needed climbing 3-5 steps with a railing? : A Lot 6 Click Score: 17    End of Session Equipment Utilized During Treatment: Gait belt Activity Tolerance: Patient tolerated treatment well Patient left: in chair;with call bell/phone within reach;with chair alarm set Nurse Communication: Mobility status;Other (comment)(request for OT eval)       Time: 9357-0177 PT Time Calculation (min) (ACUTE ONLY): 39 min  Charges:  $Gait Training: 23-37 mins $Therapeutic Activity: 8-22 mins                    Chesley Noon, PTA 03/05/20, 2:22 PM

## 2020-03-06 LAB — MAGNESIUM: Magnesium: 1.8 mg/dL (ref 1.7–2.4)

## 2020-03-06 LAB — CBC
HCT: 33.9 % — ABNORMAL LOW (ref 39.0–52.0)
Hemoglobin: 11.3 g/dL — ABNORMAL LOW (ref 13.0–17.0)
MCH: 29.5 pg (ref 26.0–34.0)
MCHC: 33.3 g/dL (ref 30.0–36.0)
MCV: 88.5 fL (ref 80.0–100.0)
Platelets: 289 10*3/uL (ref 150–400)
RBC: 3.83 MIL/uL — ABNORMAL LOW (ref 4.22–5.81)
RDW: 15.5 % (ref 11.5–15.5)
WBC: 5.7 10*3/uL (ref 4.0–10.5)
nRBC: 0 % (ref 0.0–0.2)

## 2020-03-06 LAB — PHOSPHORUS: Phosphorus: 4.1 mg/dL (ref 2.5–4.6)

## 2020-03-06 NOTE — TOC Progression Note (Addendum)
Transition of Care [facility]) - Progression Note    Patient Details  Name: Shawn Taylor MRN: [id number] Date of Birth: Apr 15, 1956  Transition of Care [facility]) CM/SW Contact  Margarito Liner, LCSW Phone Number: 03/06/2020, 9:38 AM  Clinical Narrative: No call back from patient's daughter yet. Called patient's aunt. The family did not discuss plans to support patient when he goes home yesterday but aunt will discuss with them today before she comes to the hospital sometime after lunch.    1:14 pm: Per MD, patient will likely discharge tomorrow if still stable. CSW notified patient and his aunt at bedside. They were worried that may be too fast. CSW provided encouragement about setting up charity home health services and requesting a charity rolling walker. Per aunt, patient's [name] should be at the hospital shortly. Will follow up with him when he arrives. Patient confirmed one of his family members will be able to take him home when discharged. Made charity home health referral to [facility] for PT, OT, RN, aide.  2:25 pm: Patient does not have a PCP. He last saw a physician at [facility] in March or April of last year but did not return for follow up because of COVID. CSW working on getting them on the phone to get him re-established.   Expected Discharge Plan and Services                                                 Social Determinants of Health (SDOH) Interventions    Readmission Risk Interventions No flowsheet data found.

## 2020-03-06 NOTE — Progress Notes (Signed)
PROGRESS NOTE    Shawn Taylor  CWC:[id number] DOB: 11-08-1956 DOA: 02/13/2020 PCP: Patient, No Pcp Per     Subjective: The patient was seen and examined this morning, stable in bed. According to nursing staff and PT he has been ambulating since yesterday with some assist No confusion, chest pain, sputum, fever, vomiting.   Coughing is improved,  -----------------------------------------------------------------------------------------------------------------------------  Brief Narrative:  Shawn Taylor is a 64 y.o. M with hx HTN, substance use disorder who presented with SOB progressive over 3 weeks, palpitations.  In the ER, noted to be in new Aflutter with RVR.  Started on IV diltiazem drip and Lasix.  Cardiology consulted and admitted to hospitalist service.   Subsequently developed cardiac arrest leading to respiratory failure and intubation as well as noted to have bilateral DVT.  Later found to have severely reduced EF 20%.    Also did develop significant thrombocytopenia post heparin infusion. Patient was switched to argatroban with pharmacy consult although HIT serologies negative.   --------------------------------------------------------------------------------------------------------------------------------  Assessment & Plan:  Atrial flutter, typical with RVR Present on arrival.  Heart rate stable now -Continue Eliquis -Continue metoprolol  Acute on chronic systolic CHF/ Hypertension According to her last echo EF 20%.   Appears euvolemic We will continue current medications: Atorvastatin, BiDil, lisinopril, metoprolol, spironolactone, Lasix  Bilateral DVT No evidence of compartment syndrome or post-VTE phlebitis -Continue Eliquis  Delirium -Improved -Continue Seroquel  Dysphagia Improved -tolerating p.o.  Chest pain, ACS ruled out -Currently stable medication as above -Denying of recurrent chest pain or shortness of breath  Normocytic anemia Likely  from phlebotomy, position, critical illness. -H&H remained stable  Thrombocytopenia -Improved    Debility -PT/OT consulted, patient continued to improve now ambulating with minimal assist -Initial recommendation was SNF, SW working with patient and family for possible home health arrangement.   Disposition: Status is: Inpatient  Remains inpatient appropriate because:Unsafe d/c plan   Dispo: The patient is from: Home              Anticipated d/c is to: SNF -now the patient is ambulating does not meet the criteria for  SNF, social worker is working with the family and the patient for possible discharge home  with home health              Anticipated d/c date is: 1 day              Patient currently is medically stable to d/c.  MDM: The below labs and imaging reports reviewed and summarized above.  Medication management as above.   DVT prophylaxis: N/A on Eliquis Code Status: FULL Family Communication:      Consultants:     Procedures:     Antimicrobials:      Culture data:        Objective: Vitals:   03/05/20 1237 03/05/20 2005 03/06/20 0529 03/06/20 1213  BP: 108/70 128/74 129/75 111/66  Pulse: 91 87 81 78  Resp:  20 20 18   Temp: 97.7 F (36.5 C) 98.2 F (36.8 C) (!) 97.5 F (36.4 C) 98.4 F (36.9 C)  TempSrc:  Oral Oral Oral  SpO2: 99% 100% 99% 97%  Weight:   78 kg   Height:        Intake/Output Summary (Last 24 hours) at 03/06/2020 1224 Last data filed at 03/06/2020 0531 Gross per 24 hour  Intake --  Output 2150 ml  Net -2150 ml   Filed Weights   03/04/20 0500 03/05/20 0459 03/06/20 0529  Weight:  78.5 kg 77 kg 78 kg  Physical Exam  Constitution:  Alert, cooperative, no distress,  Psychiatric: Normal and stable mood and affect, cognition intact,   HEENT: Normocephalic, PERRL, otherwise with in Normal limits  Chest:Chest symmetric Cardio vascular:  S1/S2, RRR, No murmure, No Rubs or Gallops  pulmonary: Clear to auscultation  bilaterally, respirations unlabored, negative wheezes / crackles Abdomen: Soft, non-tender, non-distended, bowel sounds,no masses, no organomegaly Muscular skeletal:  Generalized weakness noted, Limited exam - in bed, able to move all 4 According to nursing staff patient ambulated today, extremities, Normal strength,  Neuro: CNII-XII intact. , normal motor and sensation, reflexes intact  Extremities: No pitting edema lower extremities, +2 pulses  Skin: Dry, warm to touch, negative for any Rashes, No open wounds Wounds: per nursing documentation       Data Reviewed: I have personally reviewed following labs and imaging studies:  CBC: Recent Labs  Lab 02/29/20 0532 03/01/20 0526 03/02/20 0451 03/03/20 0740 03/06/20 0601  WBC 9.8 9.2 9.8 7.7 5.7  HGB 12.1* 11.6* 11.7* 11.5* 11.3*  HCT 37.8* 35.6* 35.0* 35.7* 33.9*  MCV 90.9 90.4 88.8 90.2 88.5  PLT 70* 79* 91* 119* 323   Basic Metabolic Panel: Recent Labs  Lab 02/29/20 0532 02/29/20 0532 03/01/20 0526 03/01/20 0526 03/02/20 0451 03/03/20 0740 03/04/20 0728 03/05/20 0540 03/06/20 0601  NA 136  --  133*  --  132* 134*  --   --   --   K 3.8  --  4.1  --  4.1 3.9  --   --   --   CL 100  --  103  --  102 106  --   --   --   CO2 27  --  24  --  22 22  --   --   --   GLUCOSE 125*  --  167*  --  152* 114*  --   --   --   BUN 27*  --  25*  --  22 23  --   --   --   CREATININE 1.32*  --  1.07  --  1.02 1.18  --   --   --   CALCIUM 8.3*  --  8.4*  --  8.6* 8.7*  --   --   --   MG 1.7   < > 1.6*   < > 1.8 1.9 1.7 1.7 1.8  PHOS 3.2   < > 2.9   < > 3.5 4.3 3.8 4.3 4.1   < > = values in this interval not displayed.  Urine analysis:    Component Value Date/Time   COLORURINE YELLOW (A) 11/23/2017 1549   APPEARANCEUR CLEAR (A) 11/23/2017 1549   LABSPEC 1.005 11/23/2017 1549   PHURINE 5.0 11/23/2017 1549   GLUCOSEU NEGATIVE 11/23/2017 1549   HGBUR SMALL (A) 11/23/2017 1549   BILIRUBINUR NEGATIVE 11/23/2017 1549   KETONESUR  NEGATIVE 11/23/2017 1549   PROTEINUR NEGATIVE 11/23/2017 1549   NITRITE NEGATIVE 11/23/2017 1549   LEUKOCYTESUR NEGATIVE 11/23/2017 1549    Radiology Studies: No results found.      Scheduled Meds: . apixaban  5 mg Oral BID  . atorvastatin  40 mg Oral Q2200  . feeding supplement (ENSURE ENLIVE)  237 mL Oral TID BM  . folic acid  1 mg Oral Daily  . furosemide  40 mg Oral Daily  . isosorbide-hydrALAZINE  1 tablet Oral TID  . lisinopril  2.5 mg Oral Daily  . mouth  rinse  15 mL Mouth Rinse q12n4p  . metoprolol tartrate  12.5 mg Oral BID  . multivitamin with minerals  1 tablet Per Tube Daily  . pantoprazole  40 mg Oral Daily  . QUEtiapine  100 mg Oral QHS  . spironolactone  12.5 mg Oral Daily  . thiamine  100 mg Per Tube Daily   Continuous Infusions:   LOS: 22 days    Time spent: 25 minutes    Kendell Bane, MD [facility] 03/06/2020, 12:24 PM     Please page though AMION or Epic secure chat:  For [employer], Higher education careers adviser

## 2020-03-06 NOTE — Progress Notes (Signed)
Nutrition Follow-up  DOCUMENTATION CODES:   Not applicable  INTERVENTION:   Ensure Enlive po TID, each supplement provides 350 kcal and 20 grams of protein  MVI daily   Magic cup BID with meals, each supplement provides 290 kcal and 9 grams of protein  NUTRITION DIAGNOSIS:   Inadequate oral intake related to inability to eat as evidenced by NPO status. -resolving  GOAL:   Patient will meet greater than or equal to 90% of their needs -progressing   MONITOR:   PO intake, Supplement acceptance, Labs, Weight trends, Skin, I & O's  ASSESSMENT:   64 year old male with PMHx of migraines, HTN admitted s/p respiratory arrest followed by cardiac arrest, cardiogenic shock, pulmonary edema, severe metabolic acidosis, bilateral lower extremity DVTs concerning for possible PE requiring intubation.  Pt remains on a dysphagia 3 diet; pt documented to be eating anywhere from sips and bites to 100% of meals. Pt is drinking Ensure supplements and eating Magic Cups. Pt is ambulating now and may be able to discharge home. Per chart, pt is down 28lbs from his admit weight but pt has remained stable for the past week.    Medications reviewed and include: folic acid, lasix, MVI, aldactone, thiamine, protonix   Labs reviewed: K 3.9 wnl, P 4.1 wnl, Mg 1.8 wnl  Diet Order:   Diet Order            DIET DYS 3 Room service appropriate? Yes; Fluid consistency: Thin  Diet effective now             EDUCATION NEEDS:   No education needs have been identified at this time  Skin:  Skin Assessment: Reviewed RN Assessment  Last BM:  4/11- type 5  Height:   Ht Readings from Last 1 Encounters:  02/23/20 5\' 11"  (1.803 m)   Weight:   Wt Readings from Last 1 Encounters:  03/06/20 78 kg   Ideal Body Weight:  78.2 kg  BMI:  Body mass index is 23.99 kg/m.  Estimated Nutritional Needs:   Kcal:  2000-2200  Protein:  110-120 grams  Fluid:  2 L/day  03/08/20 MS, RD, LDN Please refer  to [facility] for RD and/or RD on-call/weekend/after hours pager

## 2020-03-06 NOTE — Progress Notes (Signed)
Physical Therapy Treatment Patient Details Name: Shawn Taylor MRN: [id number] DOB: [birth year] Today's Date: 03/06/2020    History of Present Illness Pt is a 64 yo male presented to ED in atrial flutter 02/13/20 s/p respiratory arrest followed by cardiac arrest, cardiogenic shock, pulmonary edema, severe metabolic acidosis, BLE DVT's concerning for possible pulmonary embolisms requiring mechanical intubation and levophed. Now extubated 02/21/20. PMH of HTN, migraines.    PT Comments    Pt was asleep in supine upon arriving. He easily awakes and agrees to PT session. Pt is oriented and able to follow commands throughout session without difficulty. He was able to exit R side of bed without physical assistance. Stood and ambulated with RW 200 ft CGA for safety. NO LOB or unsteadiness noted. Only vcs for posture coreection and improved sequencing. He demonstrated safe ability to ascend/descend 8 stair with BUE support. Minimal fatigue noted. Pt returned to room and was repositioned in bed at conclusion of session. Therapist recommends Pt DC to home with HHPT to address strength, balance and safe functional mobility deficits. He will continue to benefit from skilled PT to assist pt back to PLOF.     Follow Up Recommendations  Supervision for mobility/OOB;Home health PT     Equipment Recommendations  Rolling walker with 5" wheels    Recommendations for Other Services       Precautions / Restrictions Precautions Precautions: Fall Restrictions Weight Bearing Restrictions: No    Mobility  Bed Mobility Overal bed mobility: Modified Independent Bed Mobility: Supine to Sit     Supine to sit: Modified independent (Device/Increase time)     General bed mobility comments: No physical assistance required. HOB elevated.  Transfers Overall transfer level: Needs assistance Equipment used: Rolling walker (2 wheeled) Transfers: Sit to/from Stand Sit to Stand: Supervision         General  transfer comment: Pt demonstarted safe ability to STS from EOB  Ambulation/Gait Ambulation/Gait assistance: Min guard Gait Distance (Feet): 200 Feet Assistive device: Rolling walker (2 wheeled) Gait Pattern/deviations: Step-through pattern;Trunk flexed Gait velocity: decreased   General Gait Details: pt was able to safely ambulate 200 ft with CGA for safety. no LOB however did require VCs for staying inside RW and correcting posture.   Stairs Stairs: Yes Stairs assistance: Min guard Stair Management: Two rails Number of Stairs: 8 General stair comments: pt demonstrated safe ability to ascend/descend FOS with BUE support on railings   Wheelchair Mobility    Modified Rankin (Stroke Patients Only)       Balance Overall balance assessment: Needs assistance Sitting-balance support: Feet supported Sitting balance-Leahy Scale: Good Sitting balance - Comments: generally steady with supervision   Standing balance support: Bilateral upper extremity supported Standing balance-Leahy Scale: Good Standing balance comment: no LOB with BUE support                            Cognition Arousal/Alertness: Awake/alert Behavior During Therapy: WFL for tasks assessed/performed Overall Cognitive Status: Within Functional Limits for tasks assessed                                 General Comments: Pt was A and O x 3. he was able to follow commands well without difficulty      Exercises Other Exercises Other Exercises: Pt educated re: OT role, d/c recommendations, DME recommendations, falls prevention, energy conservation, IS frequency and use Other  Exercises: Self-feeding/drinking, LBD, UBD, sit<>stand, sitting/standing balance/tolerance, IS (provided),     General Comments        Pertinent Vitals/Pain Pain Assessment: No/denies pain Faces Pain Scale: No hurt Pain Intervention(s): Monitored during session    Home Living Family/patient expects to be  discharged to:: Private residence   Available Help at Discharge: Family;Available PRN/intermittently Type of Home: House Home Access: Stairs to enter   Home Layout: One level        Prior Function Level of Independence: Independent          PT Goals (current goals can now be found in the care plan section) Acute Rehab PT Goals Patient Stated Goal: " I want to go home" Progress towards PT goals: Progressing toward goals    Frequency    Min 2X/week      PT Plan Current plan remains appropriate    Co-evaluation              AM-PAC PT "6 Clicks" Mobility   Outcome Measure  Help needed turning from your back to your side while in a flat bed without using bedrails?: A Little Help needed moving from lying on your back to sitting on the side of a flat bed without using bedrails?: A Little Help needed moving to and from a bed to a chair (including a wheelchair)?: A Little Help needed standing up from a chair using your arms (e.g., wheelchair or bedside chair)?: A Little Help needed to walk in hospital room?: A Little Help needed climbing 3-5 steps with a railing? : A Little 6 Click Score: 18    End of Session Equipment Utilized During Treatment: Gait belt Activity Tolerance: Patient tolerated treatment well Patient left: in bed;with call bell/phone within reach;with bed alarm set Nurse Communication: Mobility status PT Visit Diagnosis: Other abnormalities of gait and mobility (R26.89);Unsteadiness on feet (R26.81);Muscle weakness (generalized) (M62.81)     Time: 1010-1029 PT Time Calculation (min) (ACUTE ONLY): 19 min  Charges:  $Gait Training: 8-22 mins                     Jetta Lout PTA 03/06/20, 10:37 AM

## 2020-03-06 NOTE — Evaluation (Signed)
Occupational Therapy Evaluation Patient Details Name: Shawn Taylor MRN: [id number] DOB: [birth year] Today's Date: 03/06/2020    History of Present Illness Pt is a 64 yo male presented to ED in atrial flutter 02/13/20 s/p respiratory arrest followed by cardiac arrest, cardiogenic shock, pulmonary edema, severe metabolic acidosis, BLE DVT's concerning for possible pulmonary embolisms requiring mechanical intubation and levophed. Now extubated 02/21/20. PMH of HTN, migraines.   Clinical Impression   Mr Glaus was seen for OT evaluation this date. Prior to hospital admission, pt was independent in all I/ADLs and functional mobility. Pt lives alone in one level home. Pt presents to acute OT demonstrating impaired ADL performance and functional mobility 2/2 functional strength/endurance/balance deficits and decreased fine motor coordination. Pt currently requires SBA don/doff B socks seated EOB and SUP only for seated ADLs including self-feeding, UBD, and grooming tasks. Pt would benefit from skilled OT to address noted impairments and functional limitations (see below for any additional details) in order to maximize safety and independence while minimizing falls risk and caregiver burden. Upon hospital discharge, recommend HHOT to maximize pt safety and return to functional independence during meaningful occupations of daily life.     Follow Up Recommendations  Home health OT;Supervision/Assistance for OOB mobility    Equipment Recommendations  3 in 1 bedside commode;Other (comment)(grab bars in shower)    Recommendations for Other Services       Precautions / Restrictions Restrictions Weight Bearing Restrictions: No      Mobility Bed Mobility Overal bed mobility: Needs Assistance             General bed mobility comments: Pt found and left seated EOB   Transfers Overall transfer level: Needs assistance   Transfers: Sit to/from Stand Sit to Stand: Supervision               Balance Overall balance assessment: Needs assistance Sitting-balance support: Feet supported Sitting balance-Leahy Scale: Good     Standing balance support: No upper extremity supported Standing balance-Leahy Scale: Fair Standing balance comment: standing at EOB, pt reaches behind back to simulate tucking in shirt c no LOB                           ADL either performed or assessed with clinical judgement   ADL Overall ADL's : Needs assistance/impaired                                       General ADL Comments: SETUP self-feeding/drinking seated at EOB. SBA don/doff B socks seated EOB. SBA simulated UBD dressign standing at [place]     Praxis      Pertinent Vitals/Pain Pain Assessment: No/denies pain     Hand Dominance Right   Extremity/Trunk Assessment Upper Extremity Assessment Upper Extremity Assessment: Generalized weakness   Lower Extremity Assessment Lower Extremity Assessment: Generalized weakness   Cervical / Trunk Assessment Cervical / Trunk Assessment: Kyphotic   Communication Communication Communication: No difficulties   Cognition Arousal/Alertness: Awake/alert Behavior During Therapy: WFL for tasks assessed/performed Overall Cognitive Status: Within Functional Limits for tasks assessed                                     General Comments  Exercises Exercises: Other exercises Other Exercises Other Exercises: Pt educated re: OT role, d/c recommendations, DME recommendations, falls prevention, energy conservation, IS frequency and use Other Exercises: Self-feeding/drinking, LBD, UBD, sit<>stand, sitting/standing balance/tolerance, IS (provided),    Shoulder Instructions      Home Living Family/patient expects to be discharged to:: Private residence   Available Help at Discharge: Family;Available PRN/intermittently Type of Home: House Home Access: Stairs to  enter [employer] of Steps: 2   Home Layout: One level     Bathroom Shower/Tub: Teacher, early years/pre: Standard Bathroom Accessibility: Yes How Accessible: Accessible via walker            Prior Functioning/Environment Level of Independence: Independent                 OT Problem List: Decreased strength;Decreased activity tolerance;Impaired balance (sitting and/or standing);Decreased knowledge of use of DME or AE      OT Treatment/Interventions: Therapeutic exercise;Self-care/ADL training;Energy conservation;DME and/or AE instruction;Patient/family education;Balance training;Therapeutic activities    OT Goals(Current goals can be found in the care plan section) Acute Rehab OT Goals Patient Stated Goal: " I want to get stronger, I feel so weak" OT Goal Formulation: With patient Time For Goal Achievement: 03/20/20 Potential to Achieve Goals: Good ADL Goals Pt Will Perform Lower Body Dressing: with modified independence;sit to/from stand(c LRAD PRN) Pt Will Perform Tub/Shower Transfer: Tub transfer;with modified independence;ambulating;grab bars(c LRAD PRN) Additional ADL Goal #1: Pt will independently verbalize plan to implement x3 energy conservation strategeis  OT Frequency: Min 2X/week   Barriers to D/C: Inaccessible home environment;Decreased caregiver support          Co-evaluation              AM-PAC OT "6 Clicks" Daily Activity     Outcome Measure Help from another person eating meals?: A Little Help from another person taking care of personal grooming?: A Little Help from another person toileting, which includes using toliet, bedpan, or urinal?: A Little Help from another person bathing (including washing, rinsing, drying)?: A Little Help from another person to put on and taking off regular upper body clothing?: A Little Help from another person to put on and taking off regular lower body clothing?: A Little 6 Click Score:  18   End of Session    Activity Tolerance: Patient tolerated treatment well Patient left: in bed;with call bell/phone within reach  OT Visit Diagnosis: Unsteadiness on feet (R26.81);Other abnormalities of gait and mobility (R26.89)                Time: 4315-4008 OT Time Calculation (min): 15 min Charges:  OT General Charges $OT Visit: 1 Visit OT Evaluation $OT Eval Low Complexity: 1 Low OT Treatments $Self Care/Home Management : 8-22 mins  Dessie Coma, M.S. OTR/L  03/06/20, 9:17 AM

## 2020-03-06 NOTE — Progress Notes (Addendum)
Daily Progress Note   Patient Name: Shawn Taylor       Date: 03/06/2020 DOB: [birth year]  Age: 64 y.o. MRN#: [id number] Attending Physician: Kendell Bane, MD Primary Care Physician: Patient, No Pcp Per Admit Date: 02/13/2020  Reason for Consultation/Follow-up: Establishing goals of care  Subjective: Patient sitting in bed, smiling and interactive. He denies complaint today. He happily states he gets to go home tomorrow. He states his son will be with him. He has a tray sitting at the bedside that has approx. 15% missing. He states he has a good appetite, but does not like the food.   Length of Stay: 22  Current Medications: Scheduled Meds:  . apixaban  5 mg Oral BID  . atorvastatin  40 mg Oral Q2200  . feeding supplement (ENSURE ENLIVE)  237 mL Oral TID BM  . folic acid  1 mg Oral Daily  . furosemide  40 mg Oral Daily  . isosorbide-hydrALAZINE  1 tablet Oral TID  . lisinopril  2.5 mg Oral Daily  . mouth rinse  15 mL Mouth Rinse q12n4p  . metoprolol tartrate  12.5 mg Oral BID  . multivitamin with minerals  1 tablet Per Tube Daily  . pantoprazole  40 mg Oral Daily  . QUEtiapine  100 mg Oral QHS  . spironolactone  12.5 mg Oral Daily  . thiamine  100 mg Per Tube Daily    Continuous Infusions:   PRN Meds: acetaminophen **OR** acetaminophen, alum & mag hydroxide-simeth, calcium carbonate, docusate sodium, guaiFENesin-dextromethorphan, haloperidol lactate, loperamide, nitroGLYCERIN, ondansetron (ZOFRAN) IV, ondansetron, oxyCODONE, polyethylene glycol, traZODone  Physical Exam Pulmonary:     Effort: Pulmonary effort is normal.  Skin:    General: Skin is warm and dry.  Neurological:     Mental Status: He is alert.             Vital Signs: BP 111/66 (BP Location: Left Arm)    Pulse 78   Temp 98.4 F (36.9 C) (Oral)   Resp 18   Ht 5\' 11"  (1.803 m)   Wt 78 kg   SpO2 97%   BMI 23.99 kg/m  SpO2: SpO2: 97 % O2 Device: O2 Device: Room Air O2 Flow Rate: O2 Flow Rate (L/min): 2 L/min  Intake/output summary:   Intake/Output Summary (Last 24 hours) at 03/06/2020 1518  Last data filed at 03/06/2020 0531 Gross per 24 hour  Intake --  Output 2150 ml  Net -2150 ml   LBM: Last BM Date: 03/06/20 Baseline Weight: Weight: 90.7 kg Most recent weight: Weight: 78 kg       Palliative Assessment/Data:    Flowsheet Rows     Most Recent Value  Intake Tab  Referral Department  Critical care  Unit at Time of Referral  ICU  Palliative Care Primary Diagnosis  Cardiac  Date Notified  02/20/20  Palliative Care Type  New Palliative care  Reason for referral  Clarify Goals of Care  Date of Admission  02/13/20  Date first seen by Palliative Care  02/21/20  # of days Palliative referral response time  1 Day(s)  # of days IP prior to Palliative referral  7  Clinical Assessment  Psychosocial & Spiritual Assessment  Palliative Care Outcomes      Patient Active Problem List   Diagnosis Date Noted  . Acute systolic CHF (congestive heart failure) ([place]) 02/26/2020  . DVT of lower extremity, bilateral ([place]) 02/26/2020  . Dysphagia 02/26/2020  . Thrombocytopenia ([place]) 02/26/2020  . Pulmonary edema 02/26/2020  . Cocaine abuse ([place]) 02/26/2020  . Alcohol abuse 02/26/2020  . AKI (acute kidney injury) ([place]) 02/26/2020  . Hyponatremia 02/26/2020  . Cardiac arrest, cause unspecified ([place]) 02/16/2020  . Liver failure ([place]) 02/16/2020  . Acute hypoxemic respiratory failure ([place]) 02/16/2020  . Atrial flutter ([employer]) 02/13/2020    Palliative Care Assessment & Plan    Recommendations/Plan:  Recommend palliative at D/C.   Will sign off. Please re-consult if needed.     Code Status:    Code Status Orders  (From admission, onward)         Start     Ordered    02/13/20 1736  Full code  Continuous     02/13/20 1738        Code Status History    This patient has a current code status but no historical code status.   Advance Care Planning Activity       Prognosis:   Unable to determine  Discharge Planning:  To Be Determined  Care plan was discussed with primary MD.   Thank you for allowing the Palliative Medicine Team to assist in the care of this patient.   Total Time 15 min Prolonged Time Billed  no      Greater than 50%  of this time was spent counseling and coordinating care related to the above assessment and plan.  Asencion Gowda, NP  Please contact Palliative Medicine Team phone at [phone] for questions and concerns.

## 2020-03-07 ENCOUNTER — Encounter: Payer: Self-pay | Admitting: Internal Medicine

## 2020-03-07 DIAGNOSIS — D696 Thrombocytopenia, unspecified: Secondary | ICD-10-CM

## 2020-03-07 DIAGNOSIS — F1410 Cocaine abuse, uncomplicated: Secondary | ICD-10-CM

## 2020-03-07 DIAGNOSIS — I5021 Acute systolic (congestive) heart failure: Secondary | ICD-10-CM

## 2020-03-07 DIAGNOSIS — F1010 Alcohol abuse, uncomplicated: Secondary | ICD-10-CM

## 2020-03-07 LAB — MAGNESIUM: Magnesium: 1.8 mg/dL (ref 1.7–2.4)

## 2020-03-07 LAB — PHOSPHORUS: Phosphorus: 4.5 mg/dL (ref 2.5–4.6)

## 2020-03-07 MED ORDER — FUROSEMIDE 40 MG PO TABS: 40.0000 mg | ORAL_TABLET | Freq: Every day | ORAL | 0 refills | Status: DC

## 2020-03-07 MED ORDER — LISINOPRIL 2.5 MG PO TABS: 2.5000 mg | ORAL_TABLET | Freq: Every day | ORAL | 0 refills | Status: DC

## 2020-03-07 MED ORDER — APIXABAN 5 MG PO TABS: 5.0000 mg | ORAL_TABLET | Freq: Two times a day (BID) | ORAL | 0 refills | Status: AC

## 2020-03-07 MED ORDER — METOPROLOL TARTRATE 25 MG PO TABS: 12.5000 mg | ORAL_TABLET | Freq: Two times a day (BID) | ORAL | 0 refills | Status: DC

## 2020-03-07 MED ORDER — ISOSORB DINITRATE-HYDRALAZINE 20-37.5 MG PO TABS: 1.0000 | ORAL_TABLET | Freq: Three times a day (TID) | ORAL | 0 refills | Status: DC

## 2020-03-07 MED ORDER — FOLIC ACID 1 MG PO TABS: 1.0000 mg | ORAL_TABLET | Freq: Every day | ORAL | 0 refills | Status: DC

## 2020-03-07 MED ORDER — THIAMINE HCL 100 MG PO TABS: 100.0000 mg | ORAL_TABLET | Freq: Every day | ORAL | 0 refills | Status: DC

## 2020-03-07 MED ORDER — SPIRONOLACTONE 25 MG PO TABS: 12.5000 mg | ORAL_TABLET | Freq: Every day | ORAL | 0 refills | Status: DC

## 2020-03-07 MED ORDER — ATORVASTATIN CALCIUM 40 MG PO TABS: 40.0000 mg | ORAL_TABLET | Freq: Every day | ORAL | 0 refills | Status: AC

## 2020-03-07 NOTE — Progress Notes (Signed)
Shawn Taylor  A and O x 4 VSS. Pt tolerating diet well. No complaints of pain or nausea. IV removed intact, prescriptions given. Pt voices understanding of discharge instructions with no further questions. Pt discharged via wheelchair with axillary.   Allergies as of 03/07/2020   No Active Allergies     Medication List    STOP taking these medications   albuterol 108 (90 Base) MCG/ACT inhaler Commonly known as: VENTOLIN HFA   traMADol 50 MG tablet Commonly known as: ULTRAM     TAKE these medications   apixaban 5 MG Tabs tablet Commonly known as: ELIQUIS Take 1 tablet (5 mg total) by mouth 2 (two) times daily.   atorvastatin 40 MG tablet Commonly known as: LIPITOR Take 1 tablet (40 mg total) by mouth daily at 10 pm.   folic acid 1 MG tablet Commonly known as: FOLVITE Take 1 tablet (1 mg total) by mouth daily. Start taking on: March 08, 2020   furosemide 40 MG tablet Commonly known as: LASIX Take 1 tablet (40 mg total) by mouth daily. Start taking on: March 08, 2020   isosorbide-hydrALAZINE 20-37.5 MG tablet Commonly known as: BIDIL Take 1 tablet by mouth 3 (three) times daily.   lisinopril 2.5 MG tablet Commonly known as: ZESTRIL Take 1 tablet (2.5 mg total) by mouth daily. Start taking on: March 08, 2020   metoprolol tartrate 25 MG tablet Commonly known as: LOPRESSOR Take 0.5 tablets (12.5 mg total) by mouth 2 (two) times daily.   spironolactone 25 MG tablet Commonly known as: ALDACTONE Take 0.5 tablets (12.5 mg total) by mouth daily. Start taking on: March 08, 2020   thiamine 100 MG tablet Take 1 tablet (100 mg total) by mouth daily. Start taking on: March 08, 2020            Durable Medical Equipment  (From admission, onward)         Start     Ordered   03/07/20 1114  For home use only DME 3 n 1  Once     03/07/20 1116   03/07/20 1114  For home use only DME Walker  Once    Question:  Patient needs a walker to treat with the following condition   Answer:  Unsteady gait   03/07/20 1116          Vitals:   03/07/20 0945 03/07/20 1157  BP: 124/62 127/67  Pulse: 85 87  Resp:  16  Temp: 97.6 F (36.4 C) (!) 97.5 F (36.4 C)  SpO2: 98% 96%    Shawn Taylor Shawn Taylor

## 2020-03-07 NOTE — TOC Progression Note (Addendum)
Transition of Care [facility]) - Progression Note    Patient Details  Name: Shawn Taylor MRN: [id number] Date of Birth: 04/30/1956  Transition of Care [facility]) CM/SW Contact  Margarito Liner, LCSW Phone Number: 03/07/2020, 9:08 AM  Clinical Narrative: Patient has an appt at [facility] with Dr. Ephraim Hamburger on Monday 4/26 at 3:40 pm. Sent secure chat to Dr. Judithann Sheen to see if he will cover his home health orders until then. [employer] is aware.    11:22 am: Dr. Judithann Sheen has agreed to cover patient's home health orders until his appt at [facility]. RW and 3-in-1 have been delivered to the room. Home health orders are in. Updated [employer] representative.  Expected Discharge Plan and Services                                                 Social Determinants of Health (SDOH) Interventions    Readmission Risk Interventions No flowsheet data found.

## 2020-03-07 NOTE — Discharge Summary (Addendum)
Physician Discharge Summary  Shawn Taylor SWN:[id number] DOB: [birth year] DOA: 02/13/2020  PCP: Patient, No Pcp Per  Admit date: 02/13/2020 Discharge date: 03/07/2020  Discharge disposition: Home with home health care   Recommendations for Outpatient Follow-Up:   Outpatient follow-up at CHF clinic as scheduled Follow-up with Dr. Alene Mires on 03/19/2020   Discharge Diagnosis:   Principal Problem:   Cardiac arrest, cause unspecified ([place]) Active Problems:   Atrial flutter ([place])   Liver failure ([place])   Acute hypoxemic respiratory failure (HCC)   Acute systolic CHF (congestive heart failure) ([place])   DVT of lower extremity, bilateral (HCC)   Dysphagia   Thrombocytopenia (HCC)   Pulmonary edema   Cocaine abuse ([place])   Alcohol abuse   AKI (acute kidney injury) ([place])   Hyponatremia    Discharge Condition: Stable.  Diet recommendation: Low-salt diet  Code status: Full code.    Hospital Course:   [name] is a 64 y.o. man with hx HTN, substance use disorder (cocaine abuse) who presented to the hospital with shortness of breath progressive over 3 weeks and palpitations.  In the ER, noted to be in new atrial flutter with RVR.  Cardiologist was consulted to assist with management.  He was  treated with IV diltiazem drip, amiodarone drip and Lasix.    Subsequently, he developed cardiac arrest leading to respiratory failure and intubation as well as noted to have bilateral DVT.  Later found to have severely reduced EF 20%.   He also developed significant thrombocytopenia post heparin infusion. Patient was switched to argatroban with pharmacy consult although HIT serologies negative.  He was transitioned to Eliquis for stroke prophylaxis and for treatment of bilateral DVT.  He was evaluated by PT and OT recommended home health therapy.  His condition has improved and he is deemed stable for discharge to home. Discharge plan was discussed with the patient and his aunt at the  bedside.    Medical Consultants:    Urologist, cardiologist and intensivist   Discharge Exam:   Vitals:   03/07/20 0604 03/07/20 0945  BP: 131/67 124/62  Pulse: 84 85  Resp: 16   Temp: 97.9 F (36.6 C) 97.6 F (36.4 C)  SpO2: 97% 98%   Vitals:   03/06/20 1213 03/06/20 1953 03/07/20 0604 03/07/20 0945  BP: 111/66 130/72 131/67 124/62  Pulse: 78 88 84 85  Resp: 18 18 16    Temp: 98.4 F (36.9 C) 97.8 F (36.6 C) 97.9 F (36.6 C) 97.6 F (36.4 C)  TempSrc: Oral Oral Oral Oral  SpO2: 97% 100% 97% 98%  Weight:      Height:         GEN: NAD SKIN: No rash EYES: EOMI ENT: MMM CV: RRR PULM: CTA B ABD: soft, ND, NT, +BS CNS: AAO x 3, non focal EXT: No edema or tenderness   The results of significant diagnostics from this hospitalization (including imaging, microbiology, ancillary and laboratory) are listed below for reference.     Procedures and Diagnostic Studies:   DG Chest Port 1 View  Result Date: 02/14/2020 CLINICAL DATA:  Hypoxia EXAM: PORTABLE CHEST 1 VIEW COMPARISON:  02/14/2020 FINDINGS: Left central line tip at the cavoatrial junction. Endotracheal tube in stable position. NG tube is in the stomach. Cardiomegaly. Vascular congestion. Layering bilateral effusions with increasing bibasilar atelectasis. No acute bony abnormality. No pneumothorax. IMPRESSION: Cardiomegaly with vascular congestion, layering effusions and bibasilar atelectasis. Electronically Signed   By: Rolm Baptise M.D.   On: 02/14/2020 23:58  DG Chest Port 1 View  Result Date: 02/14/2020 CLINICAL DATA:  Initial evaluation for central line placement. EXAM: PORTABLE CHEST 1 VIEW COMPARISON:  Prior radiograph from 02/13/2020. FINDINGS: Endotracheal tube remains in place with tip positioned 2.4 cm above the carina. Interval placement of left IJ approach central venous catheter with tip overlying the cavoatrial junction. Enteric tube courses into the abdomen. Defibrillator pad overlies the left  chest. Stable cardiomegaly. Mediastinal silhouette unchanged. Aortic atherosclerosis. Lungs hypoinflated. No pneumothorax. Probable diffuse pulmonary interstitial congestion/edema, grossly stable. No visible pleural effusion. No new consolidative opacity. Osseous structures unchanged. IMPRESSION: 1. Interval placement of left IJ approach central venous catheter with tip overlying the cavoatrial junction. Remaining support apparatus in satisfactory position. 2. Stable cardiomegaly with probable mild diffuse pulmonary interstitial edema. Electronically Signed   By: Rise Mu M.D.   On: 02/14/2020 01:04   ECHOCARDIOGRAM COMPLETE  Result Date: 02/14/2020    ECHOCARDIOGRAM REPORT   Patient Name:   [name] Date of Exam: 02/14/2020 Medical Rec #:  [id number]    Height:       71.0 in Accession #:    [id number]   Weight:       200.0 lb Date of Birth:  11-May-1956   BSA:          2.109 m Patient Age:    63 years     BP:           86/71 mmHg Patient Gender: M            HR:           89 bpm. Exam Location:  [facility] Procedure: 2D Echo, Color Doppler, Cardiac Doppler and Intracardiac            Opacification Agent Indications:     R94.31 Abnormal ECG  History:         Patient has no prior history of Echocardiogram examinations.                  Risk Factors:Hypertension and Cocaine use.  Sonographer:     Humphrey Rolls RDCS (AE) Referring Phys:  [id number] [facility] AMERY Diagnosing Phys: Arnoldo Hooker MD  Sonographer Comments: Echo performed with patient supine and on artificial respirator and suboptimal apical window. IMPRESSIONS  1. Left ventricular ejection fraction, by estimation, is <20%. The left ventricle has severely decreased function. The left ventricle demonstrates global hypokinesis. The left ventricular internal cavity size was moderately dilated. Left ventricular diastolic parameters were normal.  2. Right ventricular systolic function is severely reduced. The right ventricular size is mildly enlarged. There  is normal pulmonary artery systolic pressure.  3. Left atrial size was mildly dilated.  4. Right atrial size was mildly dilated.  5. The mitral valve is normal in structure. Moderate mitral valve regurgitation.  6. Tricuspid valve regurgitation is moderate.  7. The aortic valve is normal in structure. Aortic valve regurgitation is trivial. FINDINGS  Left Ventricle: Left ventricular ejection fraction, by estimation, is <20%. The left ventricle has severely decreased function. The left ventricle demonstrates global hypokinesis. Definity contrast agent was given IV to delineate the left ventricular endocardial borders. The left ventricular internal cavity size was moderately dilated. There is no left ventricular hypertrophy. Left ventricular diastolic parameters were normal. Right Ventricle: The right ventricular size is mildly enlarged. No increase in right ventricular wall thickness. Right ventricular systolic function is severely reduced. There is normal pulmonary artery systolic pressure. The tricuspid regurgitant velocity is 1.75 m/s, and with an assumed  right atrial pressure of 10 mmHg, the estimated right ventricular systolic pressure is 22.2 mmHg. Left Atrium: Left atrial size was mildly dilated. Right Atrium: Right atrial size was mildly dilated. Pericardium: There is no evidence of pericardial effusion. Mitral Valve: The mitral valve is normal in structure. Moderate mitral valve regurgitation. MV peak gradient, 5.4 mmHg. The mean mitral valve gradient is 2.0 mmHg. Tricuspid Valve: The tricuspid valve is normal in structure. Tricuspid valve regurgitation is moderate. Aortic Valve: The aortic valve is normal in structure. Aortic valve regurgitation is trivial. Aortic valve mean gradient measures 1.0 mmHg. Aortic valve peak gradient measures 1.4 mmHg. Aortic valve area, by VTI measures 3.94 cm. Pulmonic Valve: The pulmonic valve was normal in structure. Pulmonic valve regurgitation is mild. Aorta: The aortic  root and ascending aorta are structurally normal, with no evidence of dilitation. IAS/Shunts: No atrial level shunt detected by color flow Doppler.  LEFT VENTRICLE PLAX 2D LVIDd:         6.05 cm      Diastology LVIDs:         4.47 cm      LV e' lateral:   7.07 cm/s LV PW:         0.82 cm      LV E/e' lateral: 12.5 LV IVS:        0.72 cm      LV e' medial:    4.13 cm/s LVOT diam:     2.30 cm      LV E/e' medial:  21.4 LV SV:         25 LV SV Index:   12 LVOT Area:     4.15 cm  LV Volumes (MOD) LV vol d, MOD A2C: 136.0 ml LV vol d, MOD A4C: 128.0 ml LV vol s, MOD A2C: 92.9 ml LV vol s, MOD A4C: 84.4 ml LV SV MOD A2C:     43.1 ml LV SV MOD A4C:     128.0 ml LV SV MOD BP:      44.6 ml RIGHT VENTRICLE RV Basal diam:  4.69 cm LEFT ATRIUM             Index       RIGHT ATRIUM           Index LA diam:        4.10 cm 1.94 cm/m  RA Area:     22.50 cm LA Vol (A2C):   86.2 ml 40.88 ml/m RA Volume:   74.40 ml  35.28 ml/m LA Vol (A4C):   57.2 ml 27.13 ml/m LA Biplane Vol: 67.5 ml 32.01 ml/m  AORTIC VALVE                   PULMONIC VALVE AV Area (Vmax):    3.23 cm    PV Vmax:       0.52 m/s AV Area (Vmean):   3.43 cm    PV Vmean:      33.100 cm/s AV Area (VTI):     3.94 cm    PV VTI:        0.065 m AV Vmax:           58.10 cm/s  PV Peak grad:  1.1 mmHg AV Vmean:          35.100 cm/s PV Mean grad:  1.0 mmHg AV VTI:            0.063 m AV Peak Grad:      1.4 mmHg  AV Mean Grad:      1.0 mmHg LVOT Vmax:         45.10 cm/s LVOT Vmean:        29.000 cm/s LVOT VTI:          0.060 m LVOT/AV VTI ratio: 0.95  AORTA Ao Root diam: 3.20 cm MITRAL VALVE               TRICUSPID VALVE MV Area (PHT): 3.99 cm    TR Peak grad:   12.2 mmHg MV Peak grad:  5.4 mmHg    TR Vmax:        175.00 cm/s MV Mean grad:  2.0 mmHg MV Vmax:       1.16 m/s    SHUNTS MV Vmean:      61.2 cm/s   Systemic VTI:  0.06 m MV Decel Time: 190 msec    Systemic Diam: 2.30 cm MV E velocity: 88.30 cm/s MV A velocity: 45.60 cm/s MV E/A ratio:  1.94 Arnoldo Hooker MD  Electronically signed by Arnoldo Hooker MD Signature Date/Time: 02/14/2020/12:30:43 PM    Final      Labs:   Basic Metabolic Panel: Recent Labs  Lab 03/01/20 0526 03/01/20 0526 03/02/20 0451 03/02/20 0451 03/03/20 0740 03/04/20 5686 03/05/20 0540 03/06/20 0601 03/07/20 0628  NA 133*  --  132*  --  134*  --   --   --   --   K 4.1   < > 4.1  --  3.9  --   --   --   --   CL 103  --  102  --  106  --   --   --   --   CO2 24  --  22  --  22  --   --   --   --   GLUCOSE 167*  --  152*  --  114*  --   --   --   --   BUN 25*  --  22  --  23  --   --   --   --   CREATININE 1.07  --  1.02  --  1.18  --   --   --   --   CALCIUM 8.4*  --  8.6*  --  8.7*  --   --   --   --   MG 1.6*   < > 1.8   < > 1.9 1.7 1.7 1.8 1.8  PHOS 2.9   < > 3.5   < > 4.3 3.8 4.3 4.1 4.5   < > = values in this interval not displayed.   GFR Estimated Creatinine Clearance: 68.2 mL/min (by C-G formula based on SCr of 1.18 mg/dL). Liver Function Tests: No results for input(s): AST, ALT, ALKPHOS, BILITOT, PROT, ALBUMIN in the last 168 hours. No results for input(s): LIPASE, AMYLASE in the last 168 hours. No results for input(s): AMMONIA in the last 168 hours. Coagulation profile No results for input(s): INR, PROTIME in the last 168 hours.  CBC: Recent Labs  Lab 03/01/20 0526 03/02/20 0451 03/03/20 0740 03/06/20 0601  WBC 9.2 9.8 7.7 5.7  HGB 11.6* 11.7* 11.5* 11.3*  HCT 35.6* 35.0* 35.7* 33.9*  MCV 90.4 88.8 90.2 88.5  PLT 79* 91* 119* 289   Cardiac Enzymes: No results for input(s): CKTOTAL, CKMB, CKMBINDEX, TROPONINI in the last 168 hours. BNP: Invalid input(s): POCBNP CBG: No results for input(s): GLUCAP in the last 168 hours. D-Dimer  No results for input(s): DDIMER in the last 72 hours. Hgb A1c No results for input(s): HGBA1C in the last 72 hours. Lipid Profile No results for input(s): CHOL, HDL, LDLCALC, TRIG, CHOLHDL, LDLDIRECT in the last 72 hours. Thyroid function studies No results for  input(s): TSH, T4TOTAL, T3FREE, THYROIDAB in the last 72 hours.  Invalid input(s): FREET3 Anemia work up No results for input(s): VITAMINB12, FOLATE, FERRITIN, TIBC, IRON, RETICCTPCT in the last 72 hours. Microbiology No results found for this or any previous visit (from the past 240 hour(s)).   Discharge Instructions:   Discharge Instructions    AMB referral to CHF clinic   Complete by: As directed    Diet - low sodium heart healthy   Complete by: As directed    Face-to-face encounter (required for Medicare/Medicaid patients)   Complete by: As directed    I Catheline Hixon certify that this patient is under my care and that I, or a nurse practitioner or physician's assistant working with me, had a face-to-face encounter that meets the physician face-to-face encounter requirements with this patient on 03/07/2020. The encounter with the patient was in whole, or in part for the following medical condition(s) which is the primary reason for home health care (List medical condition): s/p cardiac arrest, CHF, unsteady gait   The encounter with the patient was in whole, or in part, for the following medical condition, which is the primary reason for home health care: s/p cardiac arrest, CHF, unsteady gait   I certify that, based on my findings, the following services are medically necessary home health services:  Nursing Physical therapy     Reason for Medically Necessary Home Health Services:  Skilled Nursing- Changes in Medication/Medication Management Therapy- Gait Training, Transfer Training and Stair Training     My clinical findings support the need for the above services:  Shortness of breath with activity Unable to leave home safely without assistance and/or assistive device     Further, I certify that my clinical findings support that this patient is homebound due to:  Unable to leave home safely without assistance Shortness of Breath with activity     Home Health   Complete by: As  directed    To provide the following care/treatments:  PT OT RN Home Health Aide     Increase activity slowly   Complete by: As directed      Allergies as of 03/07/2020   No Active Allergies     Medication List    STOP taking these medications   albuterol 108 (90 Base) MCG/ACT inhaler Commonly known as: VENTOLIN HFA   traMADol 50 MG tablet Commonly known as: ULTRAM     TAKE these medications   apixaban 5 MG Tabs tablet Commonly known as: ELIQUIS Take 1 tablet (5 mg total) by mouth 2 (two) times daily.   atorvastatin 40 MG tablet Commonly known as: LIPITOR Take 1 tablet (40 mg total) by mouth daily at 10 pm.   folic acid 1 MG tablet Commonly known as: FOLVITE Take 1 tablet (1 mg total) by mouth daily. Start taking on: March 08, 2020   furosemide 40 MG tablet Commonly known as: LASIX Take 1 tablet (40 mg total) by mouth daily. Start taking on: March 08, 2020   isosorbide-hydrALAZINE 20-37.5 MG tablet Commonly known as: BIDIL Take 1 tablet by mouth 3 (three) times daily.   lisinopril 2.5 MG tablet Commonly known as: ZESTRIL Take 1 tablet (2.5 mg total) by mouth daily. Start taking on:  March 08, 2020   metoprolol tartrate 25 MG tablet Commonly known as: LOPRESSOR Take 0.5 tablets (12.5 mg total) by mouth 2 (two) times daily.   spironolactone 25 MG tablet Commonly known as: ALDACTONE Take 0.5 tablets (12.5 mg total) by mouth daily. Start taking on: March 08, 2020   thiamine 100 MG tablet Take 1 tablet (100 mg total) by mouth daily. Start taking on: March 08, 2020            Durable Medical Equipment  (From admission, onward)         Start     Ordered   03/07/20 1114  For home use only DME 3 n 1  Once     03/07/20 1116   03/07/20 1114  For home use only DME Walker  Once    Question:  Patient needs a walker to treat with the following condition  Answer:  Unsteady gait   03/07/20 1116         Follow-up Information    Revelo, Presley Raddle, MD. Go on 03/19/2020.   Specialty: Family Medicine Why: [facility]. Appt at 3:40 pm. Contact information: [address] [place] [address] [phone]            Time coordinating discharge: 35 minutes  Signed:  Lurene Shadow  [facility] 03/07/2020, 11:17 AM

## 2020-03-07 NOTE — TOC Transition Note (Signed)
Transition of Care [facility]) - CM/SW Discharge Note   Patient Details  Name: Shawn Taylor MRN: [id number] Date of Birth: Jun 23, 1956  Transition of Care [facility]) CM/SW Contact:  Margarito Liner, LCSW Phone Number: 03/07/2020, 12:19 PM   Clinical Narrative:  Patient has orders to discharge home today. [name] is at bedside and will take him home. DME has been delivered to room. [facility] is aware of discharge today. No further concerns. CSW signing off.   Final next level of care: Home w Home Health Services Barriers to Discharge: Barriers Resolved   Patient Goals and CMS Choice     Choice offered to / list presented to : Patient  Discharge Placement                Patient to be transferred to facility by: Son will take him home. Name of family member notified: Berna Spare and [name] Patient and family notified of of transfer: 03/07/20  Discharge Plan and Services                DME Arranged: 3-N-1, Walker rolling DME Agency: [employer] Date DME Agency Contacted: 03/07/20   Representative spoke with at DME Agency: Mitchell Heir HH Arranged: RN, PT, OT, Nurse's Aide HH Agency: [facility] Date [facility] Agency Contacted: 03/07/20   Representative spoke with at [facility] Agency: [place]  Social Determinants of Health (SDOH) Interventions     Readmission Risk Interventions No flowsheet data found.

## 2020-03-12 NOTE — ACP (Advance Care Planning) (Signed)
11:30am - CH reentered rm.; dtr. had questions PR:KSYSDB Will question 1.  CH discussed Living Will w/pt. and family present in rm.  Pt. clear re: dtr. Lacretia Nicks being MPOA but wished to put all decisions re: Living Will in her hands.  Family protested that they needed to know what pt. would want; CH explained that completing Living Will would be a service to family members --would take away burden of decisionmaking.  Pt. came to understand the need to record his wishes in Living Will.  After discussion, pt. says he would not like to be kept on life support in any of the three scenarios in Living Will Q1 (incurable illness and beginning of organ failure, apparently permanent coma, advanced dementia and deterioration of brain function).  Pt. interested in comfort care and agreeable to AD being used by medical team.  After completing document CH left document in rm.  Will return later w/witness and notary to finalize.  2pm - CH returned w/notary NT Kathie Rhodes and two witnesses; pt. agreeable to signing document, but somewhat uncomfortable signing w/o any family members present. CH assured pt. that signing only finalized decisions made in earlier discussions --nothing had been added to AD finished earlier.  CH made copies for pt. and copy for [facility] chart/Vynca/Epic.  Left copies and original w/pt.  No further needs expressed at this time.

## 2020-03-19 ENCOUNTER — Other Ambulatory Visit: Payer: Self-pay

## 2020-03-19 ENCOUNTER — Ambulatory Visit: Payer: Medicaid Other | Attending: Family | Admitting: Family

## 2020-03-19 ENCOUNTER — Encounter: Payer: Self-pay | Admitting: Family

## 2020-03-19 VITALS — BP 118/67 | HR 97 | Resp 16 | Ht 71.0 in | Wt 170.5 lb

## 2020-03-19 DIAGNOSIS — I5022 Chronic systolic (congestive) heart failure: Secondary | ICD-10-CM

## 2020-03-19 DIAGNOSIS — Z87891 Personal history of nicotine dependence: Secondary | ICD-10-CM | POA: Insufficient documentation

## 2020-03-19 DIAGNOSIS — I10 Essential (primary) hypertension: Secondary | ICD-10-CM

## 2020-03-19 DIAGNOSIS — Z86718 Personal history of other venous thrombosis and embolism: Secondary | ICD-10-CM | POA: Insufficient documentation

## 2020-03-19 DIAGNOSIS — I110 Hypertensive heart disease with heart failure: Secondary | ICD-10-CM | POA: Insufficient documentation

## 2020-03-19 DIAGNOSIS — I469 Cardiac arrest, cause unspecified: Secondary | ICD-10-CM | POA: Insufficient documentation

## 2020-03-19 DIAGNOSIS — Z7901 Long term (current) use of anticoagulants: Secondary | ICD-10-CM | POA: Diagnosis not present

## 2020-03-19 DIAGNOSIS — I4892 Unspecified atrial flutter: Secondary | ICD-10-CM | POA: Diagnosis not present

## 2020-03-19 DIAGNOSIS — Z79899 Other long term (current) drug therapy: Secondary | ICD-10-CM | POA: Diagnosis not present

## 2020-03-19 DIAGNOSIS — I82503 Chronic embolism and thrombosis of unspecified deep veins of lower extremity, bilateral: Secondary | ICD-10-CM

## 2020-03-19 DIAGNOSIS — I509 Heart failure, unspecified: Secondary | ICD-10-CM | POA: Diagnosis present

## 2020-03-19 NOTE — Progress Notes (Signed)
Patient ID: Shawn Taylor, male    DOB: 03-02-1956, 64 y.o.   MRN: [id number]  HPI  Shawn Taylor is a 64 y/o male with a history of HTN, atrial flutter, DVT, thrombocytopenia, alcohol use, recent tobacco use and chronic heart failure.   Echo report from 02/14/20 reviewed and showed an EF of <20% along with moderate Shawn/TR.   Admitted 02/13/20 due to shortness of breath and palpitations. Cardiology, urology and palliative care consults obtained. Noted to be in new atrial flutter with RVR. Given IV diltiazem drip, amiodarone drip and Lasix. Developed cardiac arrest leading to intubation and new diagnosis of bilateral DVT. Then developed significant thrombocytopenia post heparin infusion with negative HIT serologies. PT/OT evaluations done. Discharged after 23 days.  He presents today for his initial visit with a chief complaint of moderate shortness of breath with little exertion. He describes this as having been present for several months but does feel like it's getting better. He has associated fatigue, cough and occasional dizziness along with this. He denies any difficulty sleeping, abdominal distention, palpitations, pedal edema or chest pain.   Has no scales so hasn't been weighing himself. Aunt that is with him describes difficulty in cutting 2 of his medications in half. Using a pill cutter but they still don't cut evenly sometimes.   Past Medical History:  Diagnosis Date  . Atrial flutter ([place])   . CHF (congestive heart failure) ([place])   . DVT (deep venous thrombosis) ([place])   . Hypertension   . Migraines   . Thrombocytopenia ([place])    No past surgical history on file. No family history on file. Social History   Tobacco Use  . Smoking status: Former Smoker    Packs/day: 0.50    Types: Cigarettes    Quit date: 02/13/2020    Years since quitting: 0.0  . Smokeless tobacco: Never Used  Substance Use Topics  . Alcohol use: Yes    Alcohol/week: 2.0 - 3.0 standard drinks    Types: 2 - 3  Cans of beer per week   No Active Allergies Prior to Admission medications   Medication Sig Start Date End Date Taking? Authorizing Provider  apixaban (ELIQUIS) 5 MG TABS tablet Take 1 tablet (5 mg total) by mouth 2 (two) times daily. 03/07/20  Yes Jennye Boroughs, MD  atorvastatin (LIPITOR) 40 MG tablet Take 1 tablet (40 mg total) by mouth daily at 10 pm. 03/07/20  Yes Jennye Boroughs, MD  folic acid (FOLVITE) 1 MG tablet Take 1 tablet (1 mg total) by mouth daily. 03/08/20  Yes Jennye Boroughs, MD  furosemide (LASIX) 40 MG tablet Take 1 tablet (40 mg total) by mouth daily. 03/08/20  Yes Jennye Boroughs, MD  isosorbide-hydrALAZINE (BIDIL) 20-37.5 MG tablet Take 1 tablet by mouth 3 (three) times daily. 03/07/20  Yes Jennye Boroughs, MD  lisinopril (ZESTRIL) 2.5 MG tablet Take 1 tablet (2.5 mg total) by mouth daily. 03/08/20  Yes Jennye Boroughs, MD  metoprolol tartrate (LOPRESSOR) 25 MG tablet Take 0.5 tablets (12.5 mg total) by mouth 2 (two) times daily. 03/07/20  Yes Jennye Boroughs, MD  spironolactone (ALDACTONE) 25 MG tablet Take 0.5 tablets (12.5 mg total) by mouth daily. 03/08/20  Yes Jennye Boroughs, MD  thiamine 100 MG tablet Take 1 tablet (100 mg total) by mouth daily. 03/08/20  Yes Jennye Boroughs, MD  vitamin B-12 (CYANOCOBALAMIN) 100 MCG tablet Take 100 mcg by mouth daily.   Yes [provider]     Review of Systems  Constitutional:  Positive for fatigue. Negative for appetite change.  HENT: Positive for rhinorrhea. Negative for congestion and sore throat.   Eyes: Negative.   Respiratory: Positive for cough and shortness of breath.   Cardiovascular: Negative for chest pain, palpitations and leg swelling.  Gastrointestinal: Negative for abdominal distention and abdominal pain.  Endocrine: Negative.   Genitourinary: Negative.   Musculoskeletal: Negative for back pain and neck pain.  Skin: Negative.   Allergic/Immunologic: Negative.   Neurological: Positive for dizziness (at times).  Negative for light-headedness.  Hematological: Negative for adenopathy. Does not bruise/bleed easily.  Psychiatric/Behavioral: Negative for dysphoric mood and sleep disturbance (sleeping on 2 pillows). The patient is not nervous/anxious.     Vitals:   03/19/20 1022  BP: 118/67  Pulse: 97  Resp: 16  SpO2: 100%  Weight: 170 lb 8 oz (77.3 kg)  Height: _0  (1.803 m)   Wt Readings from Last 3 Encounters:  03/19/20 170 lb 8 oz (77.3 kg)  03/06/20 172 lb (78 kg)  04/06/18 185 lb (83.9 kg)   Lab Results  Component Value Date   CREATININE 1.18 03/03/2020   CREATININE 1.02 03/02/2020   CREATININE 1.07 03/01/2020    Physical Exam Vitals and nursing note reviewed.  Constitutional:      Appearance: He is well-developed.  HENT:     Head: Normocephalic and atraumatic.  Neck:     Vascular: No JVD.  Cardiovascular:     Rate and Rhythm: Regular rhythm. Tachycardia present.  Pulmonary:     Effort: Pulmonary effort is normal. No respiratory distress.     Breath sounds: No wheezing or rales.  Abdominal:     Palpations: Abdomen is soft.     Tenderness: There is no abdominal tenderness.  Musculoskeletal:     Cervical back: Normal range of motion and neck supple.     Right lower leg: No edema.     Left lower leg: No edema.  Neurological:     General: No focal deficit present.     Mental Status: He is alert and oriented to person, place, and time.  Psychiatric:        Mood and Affect: Mood normal.        Behavior: Behavior normal.    Assessment & Plan:  1: Chronic heart failure with reduced ejection fraction- - NYHA class III - euvolemic today - not weighing daily as he doesn't have scales; set of scales given to him and he was instructed to weigh every morning, write the weight down and call for an overnight weight gain of >2 pounds or a weekly weight gain of >5 pounds - not adding salt but does rely on others bringing food or eating convenient food (recently had rotisserie  chicken, broccoli and cheese and mac and cheese). Aunt that is with him is aware that those foods are higher in sodium. Reviewed the importance of following a low sodium diet and written dietary information and low sodium cookbook were given to him - saw cardiology Nehemiah Massed) during recent admission; aunt says that he has medicaid pending; phone number to his office given to them so that they can call and find out what his copay would be since he's currently uninsured - brochure given to them about [facility]; encouraged them to stop over there and pick up a new patient application - would like to switch his lisinopril to entresto as well as change his BID metoprolol to succinate version but he will have to wait as his aunt  says that he has to finish his current medications since they already bought them - BNP 02/20/20 was 532.0 - hasn't smoked since his recent admission  2: HTN- - BP looks good today - PCP at [facility]) and has an appointment later today - BMP on 03/03/20 reviewed and showed sodium 134, potassium 3.9, creatinine 1.18 and GFR >60  3: DVT- - currently on apixaban but his aunt says that it's quite expensive; does have a coupon for it - hopefully he can get involved with medication management clinic until his medicaid gets approved.   Medication bottles reviewed.   Return in 6 weeks or sooner for any questions/problems before then.

## 2020-03-19 NOTE — Patient Instructions (Addendum)
Begin weighing daily and call for an overnight weight gain of > 2 pounds or a weekly weight gain of >5 pounds.    Call to schedule an appointment with Dr. [facility] at [phone].

## 2020-03-28 DIAGNOSIS — I10 Essential (primary) hypertension: Secondary | ICD-10-CM | POA: Insufficient documentation

## 2020-04-14 ENCOUNTER — Emergency Department: Payer: Medicaid Other

## 2020-04-14 ENCOUNTER — Emergency Department
Admission: EM | Admit: 2020-04-14 | Discharge: 2020-04-14 | Disposition: A | Discharge status: Home / Self Care | Payer: Medicaid Other | Attending: Emergency Medicine | Admitting: Emergency Medicine

## 2020-04-14 ENCOUNTER — Encounter: Payer: Self-pay | Admitting: Emergency Medicine

## 2020-04-14 ENCOUNTER — Other Ambulatory Visit: Payer: Self-pay

## 2020-04-14 DIAGNOSIS — M79604 Pain in right leg: Secondary | ICD-10-CM

## 2020-04-14 DIAGNOSIS — M79605 Pain in left leg: Secondary | ICD-10-CM

## 2020-04-14 DIAGNOSIS — I5021 Acute systolic (congestive) heart failure: Secondary | ICD-10-CM | POA: Diagnosis not present

## 2020-04-14 DIAGNOSIS — M79661 Pain in right lower leg: Secondary | ICD-10-CM | POA: Diagnosis present

## 2020-04-14 DIAGNOSIS — M79672 Pain in left foot: Secondary | ICD-10-CM | POA: Diagnosis not present

## 2020-04-14 DIAGNOSIS — I4892 Unspecified atrial flutter: Secondary | ICD-10-CM | POA: Insufficient documentation

## 2020-04-14 DIAGNOSIS — M79662 Pain in left lower leg: Secondary | ICD-10-CM | POA: Diagnosis not present

## 2020-04-14 DIAGNOSIS — I110 Hypertensive heart disease with heart failure: Secondary | ICD-10-CM | POA: Diagnosis not present

## 2020-04-14 DIAGNOSIS — Z86718 Personal history of other venous thrombosis and embolism: Secondary | ICD-10-CM | POA: Insufficient documentation

## 2020-04-14 DIAGNOSIS — M79671 Pain in right foot: Secondary | ICD-10-CM | POA: Insufficient documentation

## 2020-04-14 DIAGNOSIS — Z7901 Long term (current) use of anticoagulants: Secondary | ICD-10-CM | POA: Diagnosis not present

## 2020-04-14 LAB — BASIC METABOLIC PANEL
Anion gap: 6 (ref 5–15)
BUN: 27 mg/dL — ABNORMAL HIGH (ref 8–23)
CO2: 21 mmol/L — ABNORMAL LOW (ref 22–32)
Calcium: 9.8 mg/dL (ref 8.9–10.3)
Chloride: 107 mmol/L (ref 98–111)
Creatinine, Ser: 0.95 mg/dL (ref 0.61–1.24)
GFR calc Af Amer: >60 mL/min (ref >60)
GFR calc non Af Amer: >60 mL/min (ref >60)
Glucose, Bld: 93 mg/dL (ref 70–99)
Potassium: 4.2 mmol/L (ref 3.5–5.1)
Sodium: 134 mmol/L — ABNORMAL LOW (ref 135–145)

## 2020-04-14 LAB — CBC WITH DIFFERENTIAL/PLATELET
Abs Immature Granulocytes: 0.13 10*3/uL — ABNORMAL HIGH (ref 0.00–0.07)
Basophils Absolute: 0.1 10*3/uL (ref 0.0–0.1)
Basophils Relative: 1 %
Eosinophils Absolute: 0.5 10*3/uL (ref 0.0–0.5)
Eosinophils Relative: 4 %
HCT: 35.3 % — ABNORMAL LOW (ref 39.0–52.0)
Hemoglobin: 11.8 g/dL — ABNORMAL LOW (ref 13.0–17.0)
Immature Granulocytes: 1 %
Lymphocytes Relative: 17 %
Lymphs Abs: 2.1 10*3/uL (ref 0.7–4.0)
MCH: 28.4 pg (ref 26.0–34.0)
MCHC: 33.4 g/dL (ref 30.0–36.0)
MCV: 85.1 fL (ref 80.0–100.0)
Monocytes Absolute: 0.7 10*3/uL (ref 0.1–1.0)
Monocytes Relative: 6 %
Neutro Abs: 8.6 10*3/uL — ABNORMAL HIGH (ref 1.7–7.7)
Neutrophils Relative %: 71 %
Platelets: 337 10*3/uL (ref 150–400)
RBC: 4.15 MIL/uL — ABNORMAL LOW (ref 4.22–5.81)
RDW: 15.7 % — ABNORMAL HIGH (ref 11.5–15.5)
WBC: 12.1 10*3/uL — ABNORMAL HIGH (ref 4.0–10.5)
nRBC: 0 % (ref 0.0–0.2)

## 2020-04-14 MED ORDER — TRAMADOL HCL 50 MG PO TABS: 50.0000 mg | ORAL_TABLET | Freq: Four times a day (QID) | ORAL | 0 refills | Status: DC | PRN

## 2020-04-14 MED ORDER — TRAMADOL HCL 50 MG PO TABS
50.0000 mg | ORAL_TABLET | Freq: Once | ORAL | Status: AC
  Administered 2020-04-14: 50 mg via ORAL
  Filled 2020-04-14: qty 1

## 2020-04-14 NOTE — Discharge Instructions (Signed)
Follow-up with [facility] acute care if any continued problems.  Continue taking your regular medication as prescribed by your doctors.  A tramadol tablet was given to you while in the emergency department for pain.  A prescription is also being sent to a pharmacy for you to continue for the next 2 to 3 days.  If any severe worsening of your symptoms you should return to the emergency department.  Increase fluids.

## 2020-04-14 NOTE — ED Notes (Signed)
See triage note, c/o bilateral leg pain, able to ambulate, but c/o pain.

## 2020-04-14 NOTE — ED Triage Notes (Addendum)
Pt arrived via POV with c/o bilateral leg pain for "a while" pt states the Right leg hurts worse than the left leg. Denies any injury. Pt states the right leg hurts "24/7"  No swelling noted at this time.

## 2020-04-14 NOTE — ED Provider Notes (Signed)
[facility] Emergency Department Provider Note  ____________________________________________   First MD Initiated Contact with Patient 04/14/20 1721     (approximate)  I have reviewed the triage vital signs and the nursing notes.   HISTORY  Chief Complaint Leg Pain   HPI Dandrae Kustra is a 64 y.o. male presents to the ED with complaint of bilateral leg pain for 1 month without history of injury.  Patient states that his feet hurt bilaterally and also up to his knees.  He denies any swelling of his legs or warmth.  He denies any injury or traveling for an extended period of time sitting.  Patient denies any swelling of his legs.  Patient currently is taking Eliquis due to atrial flutter/atrial fib, hypertension, chronic CHF and history of bilateral chronic DVT.  He continues to follow with Dr. Gwen Pounds in the cardiology department at [facility].     Past Medical History:  Diagnosis Date  . Atrial flutter (H[facility])   . CHF (congestive heart failure) ([facility])   . DVT (deep venous thrombosis) ([facility])   . Hypertension   . Migraines   . Thrombocytopenia [facility])     Patient Active Problem List   Diagnosis Date Noted  . Acute systolic CHF (congestive heart failure) (HCC) 02/26/2020  . DVT of lower extremity, bilateral (HCC) 02/26/2020  . Dysphagia 02/26/2020  . Thrombocytopenia (H[facility]) 02/26/2020  . Pulmonary edema 02/26/2020  . Cocaine abuse (HCC) 02/26/2020  . Alcohol abuse 02/26/2020  . AKI (acute kidney injury) (HCC) 02/26/2020  . Hyponatremia 02/26/2020  . Cardiac arrest, cause unspecified (HCC) 02/16/2020  . Liver failure (HCC) 02/16/2020  . Acute hypoxemic respiratory failure (HCC) 02/16/2020  . Atrial flutter (H[facility]) 02/13/2020    History reviewed. No pertinent surgical history.  Prior to Admission medications   Medication Sig Start Date End Date Taking? Authorizing Provider  apixaban (ELIQUIS) 5 MG TABS tablet Take 1 tablet (5 mg total) by mouth 2  (two) times daily. 03/07/20   Lurene Shadow, MD  atorvastatin (LIPITOR) 40 MG tablet Take 1 tablet (40 mg total) by mouth daily at 10 pm. 03/07/20   Lurene Shadow, MD  folic acid (FOLVITE) 1 MG tablet Take 1 tablet (1 mg total) by mouth daily. 03/08/20   Lurene Shadow, MD  furosemide (LASIX) 40 MG tablet Take 1 tablet (40 mg total) by mouth daily. 03/08/20   Lurene Shadow, MD  isosorbide-hydrALAZINE (BIDIL) 20-37.5 MG tablet Take 1 tablet by mouth 3 (three) times daily. 03/07/20   Lurene Shadow, MD  lisinopril (ZESTRIL) 2.5 MG tablet Take 1 tablet (2.5 mg total) by mouth daily. 03/08/20   Lurene Shadow, MD  metoprolol tartrate (LOPRESSOR) 25 MG tablet Take 0.5 tablets (12.5 mg total) by mouth 2 (two) times daily. 03/07/20   Lurene Shadow, MD  spironolactone (ALDACTONE) 25 MG tablet Take 0.5 tablets (12.5 mg total) by mouth daily. 03/08/20   Lurene Shadow, MD  thiamine 100 MG tablet Take 1 tablet (100 mg total) by mouth daily. 03/08/20   Lurene Shadow, MD  traMADol (ULTRAM) 50 MG tablet Take 1 tablet (50 mg total) by mouth every 6 (six) hours as needed. 04/14/20   Tommi Rumps, PA-C  vitamin B-12 (CYANOCOBALAMIN) 100 MCG tablet Take 100 mcg by mouth daily.    [provider]    Allergies Patient has no known allergies.  History reviewed. No pertinent family history.  Social History Social History   Tobacco Use  . Smoking status: Former Smoker  Packs/day: 0.50    Types: Cigarettes    Quit date: 02/13/2020    Years since quitting: 0.1  . Smokeless tobacco: Never Used  Substance Use Topics  . Alcohol use: Yes    Alcohol/week: 2.0 - 3.0 standard drinks    Types: 2 - 3 Cans of beer per week  . Drug use: Yes    Frequency: 1.0 times per week    Types: Cocaine    Review of Systems Constitutional: No fever/chills Eyes: No visual changes. Cardiovascular: Denies chest pain. Respiratory: Denies shortness of breath. Gastrointestinal: No abdominal pain.  No nausea, no  vomiting. Musculoskeletal: Positive for bilateral lower extremity pain starting with his feet and going up to his knees. Skin: Negative for rash. Neurological: Negative for headaches, focal weakness or numbness. ____________________________________________   PHYSICAL EXAM:  VITAL SIGNS: ED Triage Vitals  Enc Vitals Group     BP 04/14/20 1644 106/63     Pulse Rate 04/14/20 1644 (!) 106     Resp 04/14/20 1644 18     Temp 04/14/20 1644 98 F (36.7 C)     Temp Source 04/14/20 1644 Oral     SpO2 04/14/20 1644 97 %     Weight 04/14/20 1646 166 lb (75.3 kg)     Height 04/14/20 1646 5\' 10"  (1.778 m)     Head Circumference --      Peak Flow --      Pain Score 04/14/20 1645 10     Pain Loc --      Pain Edu? --      Excl. in [place]? --     Constitutional: Alert and oriented. Well appearing and in no acute distress. Eyes: Conjunctivae are normal.  Head: Atraumatic. Neck: No stridor.   Cardiovascular: Normal rate, regular rhythm. Grossly normal heart sounds.  Good peripheral circulation. Respiratory: Normal respiratory effort.  No retractions. Lungs CTAB. Gastrointestinal: Soft and nontender. No distention.  Musculoskeletal: On examination of the lower extremities from the knees to the toes bilaterally there is no soft tissue edema, point tenderness, skin discoloration or pitting edema.  Pulses are present bilaterally.  Bevelyn Buckles' sign was negative.  No calf pain bilaterally.  No joint edema, erythema or warmth noted.  Patient has multiple fungal nails bilaterally.  Patient is able to weight-bear and is ambulatory without any assistance. Neurologic:  Normal speech and language. No gross focal neurologic deficits are appreciated. No gait instability. Skin:  Skin is warm, dry and intact. No rash noted. Psychiatric: Mood and affect are normal. Speech and behavior are normal.  ____________________________________________   LABS (all labs ordered are listed, but only abnormal results are  displayed)  Labs Reviewed  CBC WITH DIFFERENTIAL/PLATELET - Abnormal; Notable for the following components:      Result Value   WBC 12.1 (*)    RBC 4.15 (*)    Hemoglobin 11.8 (*)    HCT 35.3 (*)    RDW 15.7 (*)    Neutro Abs 8.6 (*)    Abs Immature Granulocytes 0.13 (*)    All other components within normal limits  BASIC METABOLIC PANEL - Abnormal; Notable for the following components:   Sodium 134 (*)    CO2 21 (*)    BUN 27 (*)    All other components within normal limits    RADIOLOGY  Official radiology report(s): DG Tibia/Fibula Left  Result Date: 04/14/2020 CLINICAL DATA:  Bilateral leg pain, right greater than left. History of bilateral DVT EXAM: RIGHT FOOT COMPLETE -  3+ VIEW; LEFT TIBIA AND FIBULA - 2 VIEW; RIGHT TIBIA AND FIBULA - 2 VIEW; LEFT FOOT - COMPLETE 3+ VIEW COMPARISON:  02/13/2020 FINDINGS: Right tibia/fibula: Frontal and lateral views demonstrate no fractures. Alignment of the right knee and ankle is anatomic. Soft tissues are grossly normal. Right foot: Frontal, oblique, and lateral views of the right foot demonstrate no fractures. Alignment is anatomic. Joint spaces are relatively well preserved. Soft tissues are normal. Left tibia/fibula: Frontal and lateral views demonstrate no fractures. Alignment of the left knee and ankle is anatomic. Soft tissues are grossly normal. Left foot: Frontal, oblique, and lateral views demonstrate no fractures. Alignment is anatomic. Joint spaces are well preserved. Soft tissues are normal. IMPRESSION: 1. No abnormalities of the right tibia/fibula, right foot, left tibia/fibula, or left foot. Electronically Signed   By: Sharlet Salina M.D.   On: 04/14/2020 18:41   DG Tibia/Fibula Right  Result Date: 04/14/2020 CLINICAL DATA:  Bilateral leg pain, right greater than left. History of bilateral DVT EXAM: RIGHT FOOT COMPLETE - 3+ VIEW; LEFT TIBIA AND FIBULA - 2 VIEW; RIGHT TIBIA AND FIBULA - 2 VIEW; LEFT FOOT - COMPLETE 3+ VIEW  COMPARISON:  02/13/2020 FINDINGS: Right tibia/fibula: Frontal and lateral views demonstrate no fractures. Alignment of the right knee and ankle is anatomic. Soft tissues are grossly normal. Right foot: Frontal, oblique, and lateral views of the right foot demonstrate no fractures. Alignment is anatomic. Joint spaces are relatively well preserved. Soft tissues are normal. Left tibia/fibula: Frontal and lateral views demonstrate no fractures. Alignment of the left knee and ankle is anatomic. Soft tissues are grossly normal. Left foot: Frontal, oblique, and lateral views demonstrate no fractures. Alignment is anatomic. Joint spaces are well preserved. Soft tissues are normal. IMPRESSION: 1. No abnormalities of the right tibia/fibula, right foot, left tibia/fibula, or left foot. Electronically Signed   By: Sharlet Salina M.D.   On: 04/14/2020 18:41   DG Foot Complete Left  Result Date: 04/14/2020 CLINICAL DATA:  Bilateral leg pain, right greater than left. History of bilateral DVT EXAM: RIGHT FOOT COMPLETE - 3+ VIEW; LEFT TIBIA AND FIBULA - 2 VIEW; RIGHT TIBIA AND FIBULA - 2 VIEW; LEFT FOOT - COMPLETE 3+ VIEW COMPARISON:  02/13/2020 FINDINGS: Right tibia/fibula: Frontal and lateral views demonstrate no fractures. Alignment of the right knee and ankle is anatomic. Soft tissues are grossly normal. Right foot: Frontal, oblique, and lateral views of the right foot demonstrate no fractures. Alignment is anatomic. Joint spaces are relatively well preserved. Soft tissues are normal. Left tibia/fibula: Frontal and lateral views demonstrate no fractures. Alignment of the left knee and ankle is anatomic. Soft tissues are grossly normal. Left foot: Frontal, oblique, and lateral views demonstrate no fractures. Alignment is anatomic. Joint spaces are well preserved. Soft tissues are normal. IMPRESSION: 1. No abnormalities of the right tibia/fibula, right foot, left tibia/fibula, or left foot. Electronically Signed   By: Sharlet Salina M.D.   On: 04/14/2020 18:41   DG Foot Complete Right  Result Date: 04/14/2020 CLINICAL DATA:  Bilateral leg pain, right greater than left. History of bilateral DVT EXAM: RIGHT FOOT COMPLETE - 3+ VIEW; LEFT TIBIA AND FIBULA - 2 VIEW; RIGHT TIBIA AND FIBULA - 2 VIEW; LEFT FOOT - COMPLETE 3+ VIEW COMPARISON:  02/13/2020 FINDINGS: Right tibia/fibula: Frontal and lateral views demonstrate no fractures. Alignment of the right knee and ankle is anatomic. Soft tissues are grossly normal. Right foot: Frontal, oblique, and lateral views of the right foot demonstrate no  fractures. Alignment is anatomic. Joint spaces are relatively well preserved. Soft tissues are normal. Left tibia/fibula: Frontal and lateral views demonstrate no fractures. Alignment of the left knee and ankle is anatomic. Soft tissues are grossly normal. Left foot: Frontal, oblique, and lateral views demonstrate no fractures. Alignment is anatomic. Joint spaces are well preserved. Soft tissues are normal. IMPRESSION: 1. No abnormalities of the right tibia/fibula, right foot, left tibia/fibula, or left foot. Electronically Signed   By: Sharlet Salina M.D.   On: 04/14/2020 18:41    ____________________________________________   PROCEDURES  Procedure(s) performed (including Critical Care):  Procedures   ____________________________________________   INITIAL IMPRESSION / ASSESSMENT AND PLAN / ED COURSE  As part of my medical decision making, I reviewed the following data within the electronic MEDICAL RECORD NUMBER Notes from prior ED visits and [place] Controlled Substance Database  64 year old male presents to the ED with complaint of bilateral leg pain without history of injury for 1 month.  Patient states that he hurts from his knees down to his toes.  He denies any swelling, warmth or redness.  He denies any extended periods of sitting, traveling or injury to his legs.  Currently patient is taking Eliquis due to atrial flutter/atrial fib,  hypertension, chronic CHF in history of bilateral chronic DVTs.  He is followed by Dr. Gwen Pounds in the cardiology department at [facility].  X-rays of the lower extremities are negative for any acute bony changes.  Lab work was unremarkable.  Patient was given a prescription for tramadol to take every 6 hours as needed.  He is to follow-up with his PCP if any continued problems or return to the emergency department if any severe worsening of his symptoms.  ____________________________________________   FINAL CLINICAL IMPRESSION(S) / ED DIAGNOSES  Final diagnoses:  Bilateral leg pain     ED Discharge Orders         Ordered    traMADol (ULTRAM) 50 MG tablet  Every 6 hours PRN     04/14/20 1934           Note:  This document was prepared using Dragon voice recognition software and may include unintentional dictation errors.    Tommi Rumps, PA-C 04/15/20 1600    Sharman Cheek, MD 04/15/20 2212

## 2020-04-14 NOTE — ED Triage Notes (Signed)
First nurse note- c/o pain in both legs. Denies swelling. NAD.

## 2020-04-16 ENCOUNTER — Other Ambulatory Visit: Payer: Self-pay | Admitting: Family Medicine

## 2020-04-28 NOTE — Progress Notes (Signed)
Patient ID: Shawn Taylor, male    DOB: November 21, 1956, 64 y.o.   MRN: [id number]  HPI  Shawn Taylor is a 64 y/o male with a history of HTN, atrial flutter, DVT, thrombocytopenia, alcohol use, recent tobacco use and chronic heart failure.   Echo report from 02/14/20 reviewed and showed an EF of <20% along with moderate Shawn/TR.   Was in the ED 04/14/20 due to leg pain. Lower extremity xrays were negative. Treated and released. Admitted 02/13/20 due to shortness of breath and palpitations. Cardiology, urology and palliative care consults obtained. Noted to be in new atrial flutter with RVR. Given IV diltiazem drip, amiodarone drip and Lasix. Developed cardiac arrest leading to intubation and new diagnosis of bilateral DVT. Then developed significant thrombocytopenia post heparin infusion with negative HIT serologies. PT/OT evaluations done. Discharged after 23 days.  He presents today for a follow-up visit with a chief complaint of intermittent dizziness. He says that this has been present for several years and is sporadic in nature. He has associated cough, rhinorrhea and leg pain along with this. He denies any difficulty sleeping, abdominal distention, palpitations, pedal edema, chest pain, shortness of breath, fatigue or weight gain.   Has recently been started on gabapentin for neuropathy in his legs although he's not sure it's helping much. Has been taking it ~ 2 weeks now.   Past Medical History:  Diagnosis Date   Atrial flutter (HCC)    CHF (congestive heart failure) (HCC)    DVT (deep venous thrombosis) (HCC)    Hypertension    Migraines    Thrombocytopenia (HCC)    No past surgical history on file. No family history on file. Social History   Tobacco Use   Smoking status: Former Smoker    Packs/day: 0.50    Types: Cigarettes    Quit date: 02/13/2020    Years since quitting: 0.2   Smokeless tobacco: Never Used  Substance Use Topics   Alcohol use: Yes    Alcohol/week: 2.0 - 3.0  standard drinks    Types: 2 - 3 Cans of beer per week   No Known Allergies  Prior to Admission medications   Medication Sig Start Date End Date Taking? Authorizing Provider  apixaban (ELIQUIS) 5 MG TABS tablet Take 1 tablet (5 mg total) by mouth 2 (two) times daily. 03/07/20  Yes Shawn Shadow, MD  atorvastatin (LIPITOR) 40 MG tablet Take 1 tablet (40 mg total) by mouth daily at 10 pm. 03/07/20  Yes Shawn Shadow, MD  gabapentin (NEURONTIN) 300 MG capsule Take 300 mg by mouth at bedtime.   Yes [provider]  isosorbide-hydrALAZINE (BIDIL) 20-37.5 MG tablet Take 1 tablet by mouth 3 (three) times daily. 03/07/20  Yes Shawn Shadow, MD  lisinopril (ZESTRIL) 2.5 MG tablet Take 1 tablet (2.5 mg total) by mouth daily. 03/08/20  Yes Shawn Shadow, MD  metoprolol tartrate (LOPRESSOR) 25 MG tablet Take 0.5 tablets (12.5 mg total) by mouth 2 (two) times daily. 03/07/20  Yes Shawn Shadow, MD  spironolactone (ALDACTONE) 25 MG tablet Take 0.5 tablets (12.5 mg total) by mouth daily. 03/08/20  Yes Shawn Shadow, MD  thiamine 100 MG tablet Take 1 tablet (100 mg total) by mouth daily. 03/08/20  Yes Shawn Shadow, MD  traMADol (ULTRAM) 50 MG tablet Take 1 tablet (50 mg total) by mouth every 6 (six) hours as needed. 04/14/20  Yes Shawn Hartshorn L, PA-C  vitamin B-12 (CYANOCOBALAMIN) 100 MCG tablet Take 100 mcg by mouth daily.   Yes  [provider]  furosemide (LASIX) 40 MG tablet Take 1 tablet (40 mg total) by mouth daily. Patient not taking: Reported on 04/30/2020 03/08/20   Shawn Boroughs, MD     Review of Systems  Constitutional: Negative for appetite change and fatigue.  HENT: Positive for rhinorrhea. Negative for congestion and sore throat.   Eyes: Negative.   Respiratory: Positive for cough. Negative for shortness of breath.   Cardiovascular: Negative for chest pain, palpitations and leg swelling.  Gastrointestinal: Negative for abdominal distention and abdominal pain.  Endocrine:  Negative.   Genitourinary: Negative.   Musculoskeletal: Positive for arthralgias (both legs). Negative for back pain and neck pain.  Skin: Negative.   Allergic/Immunologic: Negative.   Neurological: Positive for dizziness (at times). Negative for light-headedness.  Hematological: Negative for adenopathy. Does not bruise/bleed easily.  Psychiatric/Behavioral: Negative for dysphoric mood and sleep disturbance (sleeping on 2 pillows). The patient is not nervous/anxious.    Vitals:   04/30/20 0941  BP: 122/75  Pulse: 93  Resp: 18  SpO2: 100%  Weight: 174 lb 2 oz (79 kg)  Height: 5\' 10"  (1.778 m)   Wt Readings from Last 3 Encounters:  04/30/20 174 lb 2 oz (79 kg)  04/14/20 166 lb (75.3 kg)  03/19/20 170 lb 8 oz (77.3 kg)   Lab Results  Component Value Date   CREATININE 0.95 04/14/2020   CREATININE 1.18 03/03/2020   CREATININE 1.02 03/02/2020    Physical Exam Vitals and nursing note reviewed.  Constitutional:      Appearance: He is well-developed.  HENT:     Head: Normocephalic and atraumatic.  Neck:     Vascular: No JVD.  Cardiovascular:     Rate and Rhythm: Normal rate and regular rhythm.  Pulmonary:     Effort: Pulmonary effort is normal. No respiratory distress.     Breath sounds: No wheezing or rales.  Abdominal:     Palpations: Abdomen is soft.     Tenderness: There is no abdominal tenderness.  Musculoskeletal:     Cervical back: Normal range of motion and neck supple.     Right lower leg: No edema.     Left lower leg: No edema.  Neurological:     General: No focal deficit present.     Mental Status: He is alert and oriented to person, place, and time.  Psychiatric:        Mood and Affect: Mood normal.        Behavior: Behavior normal.    Assessment & Plan:  1: Chronic heart failure with reduced ejection fraction- - NYHA class I - euvolemic today - weighing daily; reminded to call for an overnight weight gain of >2 pounds or a weekly weight gain of >5  pounds - weight up 4 pounds since last visit here 3 weeks ago - not adding salt but does rely on others bringing food or eating convenient food  - saw cardiology Shawn Taylor) 03/28/20 - aunt picked up paperwork from [facility] - will have him finish out his lisinopril (has ~ 8 days left) and then skip a day and then begin entresto 24/26mg  BID; 30 day voucher given to him and [employer] application filled out and signed today - will check BMP at his next visit - BNP 02/20/20 was 532.0  2: HTN- - BP looks good today - PCP at [facility])  - BMP on 04/14/20 reviewed and showed sodium 134, potassium 4.2, creatinine 0.95 and GFR >60  3:  DVT- - currently on apixaban but his aunt says that it's quite expensive - she says that cardiology filled out apixaban paperwork and he is now receiving it through the mail from the company   Medication bottles reviewed.   Return in 1 month

## 2020-04-30 ENCOUNTER — Ambulatory Visit: Payer: Medicaid Other | Attending: Family | Admitting: Family

## 2020-04-30 ENCOUNTER — Other Ambulatory Visit: Payer: Self-pay

## 2020-04-30 ENCOUNTER — Encounter: Payer: Self-pay | Admitting: Family

## 2020-04-30 VITALS — BP 122/75 | HR 93 | Resp 18 | Ht 70.0 in | Wt 174.1 lb

## 2020-04-30 DIAGNOSIS — R42 Dizziness and giddiness: Secondary | ICD-10-CM | POA: Insufficient documentation

## 2020-04-30 DIAGNOSIS — I4892 Unspecified atrial flutter: Secondary | ICD-10-CM | POA: Insufficient documentation

## 2020-04-30 DIAGNOSIS — Z87891 Personal history of nicotine dependence: Secondary | ICD-10-CM | POA: Diagnosis not present

## 2020-04-30 DIAGNOSIS — R05 Cough: Secondary | ICD-10-CM | POA: Insufficient documentation

## 2020-04-30 DIAGNOSIS — Z79899 Other long term (current) drug therapy: Secondary | ICD-10-CM | POA: Insufficient documentation

## 2020-04-30 DIAGNOSIS — I5022 Chronic systolic (congestive) heart failure: Secondary | ICD-10-CM | POA: Insufficient documentation

## 2020-04-30 DIAGNOSIS — G629 Polyneuropathy, unspecified: Secondary | ICD-10-CM | POA: Insufficient documentation

## 2020-04-30 DIAGNOSIS — Z86718 Personal history of other venous thrombosis and embolism: Secondary | ICD-10-CM | POA: Diagnosis not present

## 2020-04-30 DIAGNOSIS — I10 Essential (primary) hypertension: Secondary | ICD-10-CM

## 2020-04-30 DIAGNOSIS — Z7901 Long term (current) use of anticoagulants: Secondary | ICD-10-CM | POA: Insufficient documentation

## 2020-04-30 DIAGNOSIS — I110 Hypertensive heart disease with heart failure: Secondary | ICD-10-CM | POA: Insufficient documentation

## 2020-04-30 DIAGNOSIS — J3489 Other specified disorders of nose and nasal sinuses: Secondary | ICD-10-CM | POA: Diagnosis not present

## 2020-04-30 DIAGNOSIS — I82503 Chronic embolism and thrombosis of unspecified deep veins of lower extremity, bilateral: Secondary | ICD-10-CM

## 2020-04-30 MED ORDER — SACUBITRIL-VALSARTAN 24-26 MG PO TABS: 1.0000 | ORAL_TABLET | Freq: Two times a day (BID) | ORAL | 3 refills | Status: DC

## 2020-04-30 NOTE — Patient Instructions (Addendum)
Continue weighing daily and call for an overnight weight gain of > 2 pounds or a weekly weight gain of >5 pounds.   Finish current lisinopril and then skip one day and then begin entresto 24/26mg  as 1 tablet twice daily.

## 2020-05-02 ENCOUNTER — Telehealth: Payer: Self-pay | Admitting: Family

## 2020-05-02 NOTE — Telephone Encounter (Signed)
Called to notify patient he has been approved for [employer] patient assistance for entresto thru 05/02/2021. He will need to call them and set up mail order delivery of medication.   Deetta Perla, [place]

## 2020-05-04 ENCOUNTER — Other Ambulatory Visit: Payer: Self-pay

## 2020-05-04 ENCOUNTER — Ambulatory Visit: Payer: Self-pay | Admitting: Pharmacy Technician

## 2020-05-04 DIAGNOSIS — Z79899 Other long term (current) drug therapy: Secondary | ICD-10-CM

## 2020-05-04 NOTE — Progress Notes (Signed)
  [place] for patient to inquire about charity care for hospital bills.  Financial Counselor informed patient that a denial letter from [facility] before a determination for charity care would happen.  Completed [facility] application and contract.  Patient agreed to all terms of the [facility] contract.    Patient approved to receive medication assistance at [facility] until time for re-certification in 0029, and as long as eligibility criteria continues to be met.    Provided patient with Civil engineer, contracting based on his particular needs.    Patient's Aunt, Tery Hoeger indicated that PAP application for Eliquis was completed by Dr. Alveria Apley office and patient has been approved to receive this medication.  Kellie Moor also stated that PAP application for Delene Loll was completed by [employer] at [facility] office and patient has been approved for this medication as well.  Patient does not want [facility] to complete the applications to have the medications to come to our clinic.  Patient is receiving the Eliquis and Entresto at his home.  [place] [facility]

## 2020-05-25 NOTE — Progress Notes (Signed)
Patient ID: Shawn Taylor, male    DOB: [birth year], 64 y.o.   MRN: [id number]  HPI  Shawn Taylor is a 64 y/o male with a history of HTN, atrial flutter, DVT, thrombocytopenia, alcohol use, recent tobacco use and chronic heart failure.   Echo report from 02/14/20 reviewed and showed an EF of <20% along with moderate Shawn/TR.   Was in the ED 04/14/20 due to leg pain. Lower extremity xrays were negative. Treated and released. Admitted 02/13/20 due to shortness of breath and palpitations. Cardiology, urology and palliative care consults obtained. Noted to be in new atrial flutter with RVR. Given IV diltiazem drip, amiodarone drip and Lasix. Developed cardiac arrest leading to intubation and new diagnosis of bilateral DVT. Then developed significant thrombocytopenia post heparin infusion with negative HIT serologies. PT/OT evaluations done. Discharged after 23 days.  He presents today for a follow-up visit with a chief complaint of cough. He describes this as chronic in nature having been present for several months. He has associated rhinorrhea and chronic leg pain along with this. He denies any difficulty sleeping, dizziness, abdominal distention, palpitations, pedal edema, chest pain, shortness of breath, fatigue or weight gain.   Has tolerated entresto without known side effects. Did get approval through [employer] patient assistance program and he is waiting to find out when he should receive the medication through the mail. Does request samples today in the mean time.    Past Medical History:  Diagnosis Date  . Atrial flutter (HCC)   . CHF (congestive heart failure) (HCC)   . DVT (deep venous thrombosis) (HCC)   . Hypertension   . Migraines   . Thrombocytopenia (HCC)    No past surgical history on file. No family history on file. Social History   Tobacco Use  . Smoking status: Former Smoker    Packs/day: 0.50    Types: Cigarettes    Quit date: 02/13/2020    Years since quitting: 0.2  .  Smokeless tobacco: Never Used  Substance Use Topics  . Alcohol use: Yes    Alcohol/week: 2.0 - 3.0 standard drinks    Types: 2 - 3 Cans of beer per week   No Known Allergies  Prior to Admission medications   Medication Sig Start Date End Date Taking? Authorizing Provider  apixaban (ELIQUIS) 5 MG TABS tablet Take 1 tablet (5 mg total) by mouth 2 (two) times daily. 03/07/20  Yes Shawn Shadow, MD  atorvastatin (LIPITOR) 40 MG tablet Take 1 tablet (40 mg total) by mouth daily at 10 pm. 03/07/20  Yes Shawn Shadow, MD  gabapentin (NEURONTIN) 300 MG capsule Take 300 mg by mouth at bedtime.   Yes [provider]  hydrALAZINE (APRESOLINE) 25 MG tablet Take 25 mg by mouth 3 (three) times daily.   Yes [provider]  isosorbide mononitrate (IMDUR) 30 MG 24 hr tablet Take 30 mg by mouth daily.   Yes [provider]  metoprolol tartrate (LOPRESSOR) 25 MG tablet Take 0.5 tablets (12.5 mg total) by mouth 2 (two) times daily. 03/07/20  Yes Shawn Shadow, MD  sacubitril-valsartan (ENTRESTO) 24-26 MG Take 1 tablet by mouth 2 (two) times daily. Stop lisinopril Patient bringing entresto coupon 04/30/20  Yes Shawn Taylor, Shawn Song, FNP  spironolactone (ALDACTONE) 25 MG tablet Take 0.5 tablets (12.5 mg total) by mouth daily. 03/08/20  Yes Shawn Shadow, MD  thiamine 100 MG tablet Take 1 tablet (100 mg total) by mouth daily. 03/08/20  Yes Shawn Shadow, MD  traMADol Janean Sark) 50  MG tablet Take 1 tablet (50 mg total) by mouth every 6 (six) hours as needed. 04/14/20  Yes Shawn Taylor L, Shawn Taylor  vitamin B-12 (CYANOCOBALAMIN) 100 MCG tablet Take 100 mcg by mouth daily.   Yes [provider]    Review of Systems  Constitutional: Negative for appetite change and fatigue.  HENT: Positive for rhinorrhea. Negative for congestion and sore throat.   Eyes: Negative.   Respiratory: Positive for cough. Negative for shortness of breath.   Cardiovascular: Negative for chest pain, palpitations and  leg swelling.  Gastrointestinal: Negative for abdominal distention and abdominal pain.  Endocrine: Negative.   Genitourinary: Negative.   Musculoskeletal: Positive for arthralgias (both legs). Negative for back pain and neck pain.  Skin: Negative.   Allergic/Immunologic: Negative.   Neurological: Negative for dizziness and light-headedness.  Hematological: Negative for adenopathy. Does not bruise/bleed easily.  Psychiatric/Behavioral: Negative for dysphoric mood and sleep disturbance (sleeping on 2 pillows). The patient is not nervous/anxious.    Vitals:   05/29/20 1214  BP: 112/69  Pulse: 80  Resp: 18  SpO2: 98%  Weight: 175 lb 6 oz (79.5 kg)  Height: 5\' 10"  (1.778 m)   Wt Readings from Last 3 Encounters:  05/29/20 175 lb 6 oz (79.5 kg)  04/30/20 174 lb 2 oz (79 kg)  04/14/20 166 lb (75.3 kg)   Lab Results  Component Value Date   CREATININE 0.95 04/14/2020   CREATININE 1.18 03/03/2020   CREATININE 1.02 03/02/2020     Physical Exam Vitals and nursing note reviewed.  Constitutional:      Appearance: He is well-developed.  HENT:     Head: Normocephalic and atraumatic.  Neck:     Vascular: No JVD.  Cardiovascular:     Rate and Rhythm: Normal rate and regular rhythm.  Pulmonary:     Effort: Pulmonary effort is normal. No respiratory distress.     Breath sounds: No wheezing or rales.  Abdominal:     Palpations: Abdomen is soft.     Tenderness: There is no abdominal tenderness.  Musculoskeletal:     Cervical back: Normal range of motion and neck supple.     Right lower leg: No edema.     Left lower leg: No edema.  Neurological:     General: No focal deficit present.     Mental Status: He is alert and oriented to person, place, and time.  Psychiatric:        Mood and Affect: Mood normal.        Behavior: Behavior normal.    Assessment & Plan:  1: Chronic heart failure with reduced ejection fraction- - NYHA class I - euvolemic today - weighing daily; reminded  to call for an overnight weight gain of >2 pounds or a weekly weight gain of >5 pounds - weight up 1.4 pounds since last visit here 1 month ago - not adding salt but does rely on others bringing food or eating convenient food  - saw cardiology 05/02/2020) 03/28/20 & returns next week - entresto 24/26mg  BID begun at last visit - will check BMP today - consider changing his metoprolol tartrate to succinate in the future - was unable to afford bidil so he's now taking isosorbide MN and hydralazine separately - BNP 02/20/20 was 532.0  2: HTN- - BP looks good today; no room to titrate entresto at this time - follows with [facility]CP [facility])  - BMP on 04/14/20 reviewed and showed sodium 134, potassium 4.2,  creatinine 0.95 and GFR >60  3: DVT- - currently on apixaban but his aunt says that it's quite expensive although he's now receiving it through patient assistance   Medication bottles reviewed.   Return in 4 months or sooner for any questions/problems before then.

## 2020-05-29 ENCOUNTER — Encounter: Payer: Self-pay | Admitting: Family

## 2020-05-29 ENCOUNTER — Other Ambulatory Visit: Payer: Self-pay

## 2020-05-29 ENCOUNTER — Ambulatory Visit: Payer: Medicaid Other | Attending: Family | Admitting: Family

## 2020-05-29 VITALS — BP 112/69 | HR 80 | Resp 18 | Ht 70.0 in | Wt 175.4 lb

## 2020-05-29 DIAGNOSIS — Z8674 Personal history of sudden cardiac arrest: Secondary | ICD-10-CM | POA: Diagnosis not present

## 2020-05-29 DIAGNOSIS — I110 Hypertensive heart disease with heart failure: Secondary | ICD-10-CM | POA: Diagnosis present

## 2020-05-29 DIAGNOSIS — Z7901 Long term (current) use of anticoagulants: Secondary | ICD-10-CM | POA: Insufficient documentation

## 2020-05-29 DIAGNOSIS — D696 Thrombocytopenia, unspecified: Secondary | ICD-10-CM | POA: Insufficient documentation

## 2020-05-29 DIAGNOSIS — M79606 Pain in leg, unspecified: Secondary | ICD-10-CM | POA: Diagnosis not present

## 2020-05-29 DIAGNOSIS — R05 Cough: Secondary | ICD-10-CM | POA: Insufficient documentation

## 2020-05-29 DIAGNOSIS — I82503 Chronic embolism and thrombosis of unspecified deep veins of lower extremity, bilateral: Secondary | ICD-10-CM | POA: Diagnosis not present

## 2020-05-29 DIAGNOSIS — I5022 Chronic systolic (congestive) heart failure: Secondary | ICD-10-CM | POA: Diagnosis not present

## 2020-05-29 DIAGNOSIS — Z79899 Other long term (current) drug therapy: Secondary | ICD-10-CM | POA: Diagnosis not present

## 2020-05-29 DIAGNOSIS — I10 Essential (primary) hypertension: Secondary | ICD-10-CM

## 2020-05-29 DIAGNOSIS — Z87891 Personal history of nicotine dependence: Secondary | ICD-10-CM | POA: Insufficient documentation

## 2020-05-29 DIAGNOSIS — I252 Old myocardial infarction: Secondary | ICD-10-CM | POA: Diagnosis not present

## 2020-05-29 DIAGNOSIS — I4892 Unspecified atrial flutter: Secondary | ICD-10-CM | POA: Insufficient documentation

## 2020-05-29 DIAGNOSIS — R0989 Other specified symptoms and signs involving the circulatory and respiratory systems: Secondary | ICD-10-CM | POA: Insufficient documentation

## 2020-05-29 LAB — BASIC METABOLIC PANEL
Anion gap: 7 (ref 5–15)
BUN: 20 mg/dL (ref 8–23)
CO2: 22 mmol/L (ref 22–32)
Calcium: 9.4 mg/dL (ref 8.9–10.3)
Chloride: 109 mmol/L (ref 98–111)
Creatinine, Ser: 1.01 mg/dL (ref 0.61–1.24)
GFR calc Af Amer: >60 mL/min (ref >60)
GFR calc non Af Amer: >60 mL/min (ref >60)
Glucose, Bld: 96 mg/dL (ref 70–99)
Potassium: 4.3 mmol/L (ref 3.5–5.1)
Sodium: 138 mmol/L (ref 135–145)

## 2020-05-29 NOTE — Patient Instructions (Signed)
Continue weighing daily and call for an overnight weight gain of > 2 pounds or a weekly weight gain of >5 pounds. 

## 2020-07-27 ENCOUNTER — Ambulatory Visit: Payer: Self-pay

## 2020-07-27 ENCOUNTER — Other Ambulatory Visit: Payer: Self-pay

## 2020-07-27 DIAGNOSIS — Z79899 Other long term (current) drug therapy: Secondary | ICD-10-CM

## 2020-07-27 NOTE — Progress Notes (Unsigned)
[facility] Visit Note  Patient: Shawn Taylor MRN: [id number] Date of Birth: Dec 08, 1955 PCP: Patient, No Pcp Per   Shawn Taylor 64 y.o. male presents for a *** visit today.  There were no vitals taken for this visit.  Patient Information   Past Medical History:  Diagnosis Date  . Atrial flutter (HCC)   . CHF (congestive heart failure) (HCC)   . DVT (deep venous thrombosis) (HCC)   . Hypertension   . Migraines   . Thrombocytopenia (HCC)      No past surgical history on file.  No family history on file.  New Diagnoses (since last visit):   Family Support: Good  Lifestyle Diet: Breakfast: cereal Lunch: sandwich Dinner: protein, veggies Drinks: soda            Social History   Substance and Sexual Activity  Alcohol Use Yes  . Alcohol/week: 2.0 - 3.0 standard drinks  . Types: 2 - 3 Cans of beer per week      Social History   Tobacco Use  Smoking Status Former Smoker  . Packs/day: 0.50  . Types: Cigarettes  . Quit date: 02/13/2020  . Years since quitting: 0.4  Smokeless Tobacco Never Used      Health Maintenance  Topic Date Due  . Hepatitis C Screening  Never done  . COVID-19 Vaccine (1) Never done  . COLONOSCOPY  Never done  . INFLUENZA VACCINE  Never done  . TETANUS/TDAP  04/06/2028  . HIV Screening  Completed   Health Maintenance/Date Completed  Last ED visit: 01/2020 and 03/2020 Last Visit to PCP: 04/16/20 Next Visit to PCP: 07/2020 Specialist Visit: 10/01/20 Dental Exam: none recent Eye Exam: none recent Prostate Exam: none recent  Colonoscopy: none Flu Vaccine: none Pneumonia Vaccine: none COVID-19 Vaccine: 2nd 06/27/20 Shingrix Vaccine: none  Outpatient Encounter Medications as of 07/27/2020  Medication Sig  . apixaban (ELIQUIS) 5 MG TABS tablet Take 1 tablet (5 mg total) by mouth 2 (two) times daily.  Marland Kitchen atorvastatin (LIPITOR) 40 MG tablet Take 1 tablet (40 mg total) by mouth daily at 10 pm.  . gabapentin (NEURONTIN)  300 MG capsule Take 300 mg by mouth at bedtime.  . hydrALAZINE (APRESOLINE) 25 MG tablet Take 25 mg by mouth 3 (three) times daily.  . isosorbide mononitrate (IMDUR) 30 MG 24 hr tablet Take 30 mg by mouth daily.  . metoprolol succinate (TOPROL-XL) 25 MG 24 hr tablet Take 25 mg by mouth daily.  . sacubitril-valsartan (ENTRESTO) 24-26 MG Take 1 tablet by mouth 2 (two) times daily. Stop lisinopril Patient bringing entresto coupon  . spironolactone (ALDACTONE) 25 MG tablet Take 0.5 tablets (12.5 mg total) by mouth daily. (Patient taking differently: Take 25 mg by mouth daily. )  . thiamine 100 MG tablet Take 1 tablet (100 mg total) by mouth daily.  . vitamin B-12 (CYANOCOBALAMIN) 100 MCG tablet Take 100 mcg by mouth daily. (Patient not taking: Reported on 07/27/2020)  . [DISCONTINUED] metoprolol tartrate (LOPRESSOR) 25 MG tablet Take 0.5 tablets (12.5 mg total) by mouth 2 (two) times daily.  . [DISCONTINUED] traMADol (ULTRAM) 50 MG tablet Take 1 tablet (50 mg total) by mouth every 6 (six) hours as needed.   No facility-administered encounter medications on file as of 07/27/2020.    Assessment and Plan:  Adherence/Compliance:  Patient's Aunt manages his medications. Patient takes medication as prescribed. [facility] is assisting with all of his medications except the thiamine, Eliquis and Entresto.  Patient assistance for Eliquis  and Sherryll Burger was set up by his providers. He is receiving these two medication at home.   Chronic Heart Failure with reduced EF/Hypertension:  Entresto, hydralazine, isosorbide mononitrate, metoprolol succinate, spironolactone EF <20% per Echo on 02/14/20 Potassium = 4.7 (07/13/20) Managed [facility] Gwen Pounds at [facility] and [facility])  Atrial Flutter: Eliquis Diagnosed 01/2020  Hypertension:  Appears to be stable per notes in Epic. BP 110/70 mmHg at last office visit (07/13/20).  Hyperlipidemia: Atorvastatin 40  mg Currently on a high intensity statin. Labs from 06/07/20: TC = 93 mg/dl; TG = 373 mg/dl: HDL = 22 mg/dl; LDL = 49 mg/dl  Bilateral DVT: Eliquis Diagnosed 01/2020  Pain:  Patient describes pain in his feet, toes, ankles and knees on right side; also has pain and numbness in hands. Previously on gabapentin, although this has run out. Encouraged patient to call his provider and send a new Rx to [facility].  Health Prevention: Discussed importance of receiving the influenza, pneumococcal and Shingrix vaccines. Patient did receive 2 doses of the COVID-19 vaccine.  RTC: 6 months     Amsi Grimley K. Joelene Millin, PharmD [facility]-Pharmacy Operations Coordinator [phone]

## 2020-09-04 ENCOUNTER — Other Ambulatory Visit: Payer: Self-pay

## 2020-09-04 ENCOUNTER — Encounter: Payer: Self-pay | Admitting: Family

## 2020-09-04 ENCOUNTER — Ambulatory Visit: Payer: Medicaid Other | Attending: Family | Admitting: Family

## 2020-09-04 VITALS — BP 129/66 | HR 80 | Resp 20 | Ht 70.0 in | Wt 192.0 lb

## 2020-09-04 DIAGNOSIS — M79604 Pain in right leg: Secondary | ICD-10-CM

## 2020-09-04 DIAGNOSIS — Z7901 Long term (current) use of anticoagulants: Secondary | ICD-10-CM | POA: Diagnosis not present

## 2020-09-04 DIAGNOSIS — I10 Essential (primary) hypertension: Secondary | ICD-10-CM

## 2020-09-04 DIAGNOSIS — Z87891 Personal history of nicotine dependence: Secondary | ICD-10-CM | POA: Diagnosis not present

## 2020-09-04 DIAGNOSIS — I5022 Chronic systolic (congestive) heart failure: Secondary | ICD-10-CM | POA: Insufficient documentation

## 2020-09-04 DIAGNOSIS — M79643 Pain in unspecified hand: Secondary | ICD-10-CM | POA: Insufficient documentation

## 2020-09-04 DIAGNOSIS — Z86718 Personal history of other venous thrombosis and embolism: Secondary | ICD-10-CM | POA: Insufficient documentation

## 2020-09-04 DIAGNOSIS — Z79899 Other long term (current) drug therapy: Secondary | ICD-10-CM | POA: Insufficient documentation

## 2020-09-04 DIAGNOSIS — I110 Hypertensive heart disease with heart failure: Secondary | ICD-10-CM | POA: Insufficient documentation

## 2020-09-04 DIAGNOSIS — I469 Cardiac arrest, cause unspecified: Secondary | ICD-10-CM | POA: Insufficient documentation

## 2020-09-04 DIAGNOSIS — I4892 Unspecified atrial flutter: Secondary | ICD-10-CM | POA: Insufficient documentation

## 2020-09-04 MED ORDER — SACUBITRIL-VALSARTAN 49-51 MG PO TABS: 1.0000 | ORAL_TABLET | Freq: Two times a day (BID) | ORAL | 3 refills | Status: DC

## 2020-09-04 NOTE — Progress Notes (Signed)
Patient ID: Shawn Taylor, male    DOB: [birth year], 64 y.o.   MRN: [id number]  HPI  Mr Masten is a 64 y/o male with a history of HTN, atrial flutter, DVT, thrombocytopenia, alcohol use,Current tobacco use and chronic heart failure.   Echo report from 02/14/20 reviewed and showed an EF of <20% along with moderate MR/TR.   Was in the ED 04/14/20 due to leg pain. Lower extremity xrays were negative. Treated and released. Admitted 02/13/20 due to shortness of breath and palpitations. Cardiology, urology and palliative care consults obtained. Noted to be in new atrial flutter with RVR. Given IV diltiazem drip, amiodarone drip and Lasix. Developed cardiac arrest leading to intubation and new diagnosis of bilateral DVT. Then developed significant thrombocytopenia post heparin infusion with negative HIT serologies. PT/OT evaluations done. Discharged after 23 days.  He presents today for a follow-up visit with a chief complaint of arm and leg pain. He describes this as chronic in nature having been present for several months. He has associated pins and needles feeling to feet bilaterally as well as legs up to his knees and hands. Patient reports the pain is relieved with rest and having no pressure applied to the legs.  Has tolerated entresto without known side effects. Has received the medication through [employer] patient assistance program.Patient brings his aunt as a support as she assists in managing his medication. Patient also has reportedly began to smoke again and is smoking 1-2 cigarettes daily.   Past Medical History:  Diagnosis Date  . Atrial flutter (HCC)   . CHF (congestive heart failure) (HCC)   . DVT (deep venous thrombosis) (HCC)   . Hypertension   . Migraines   . Thrombocytopenia (HCC)    No past surgical history on file. Family History  Problem Relation Age of Onset  . Diabetes Sister    Social History   Tobacco Use  . Smoking status: Former Smoker    Packs/day: 0.50    Types:  Cigarettes    Quit date: 02/13/2020    Years since quitting: 0.5  . Smokeless tobacco: Never Used  Substance Use Topics  . Alcohol use: Not Currently    Alcohol/week: 2.0 - 3.0 standard drinks    Types: 2 - 3 Cans of beer per week   No Known Allergies  Prior to Admission medications   Medication Sig Start Date End Date Taking? Authorizing Provider  apixaban (ELIQUIS) 5 MG TABS tablet Take 1 tablet (5 mg total) by mouth 2 (two) times daily. 03/07/20  Yes Lurene Shadow, MD  atorvastatin (LIPITOR) 40 MG tablet Take 1 tablet (40 mg total) by mouth daily at 10 pm. 03/07/20  Yes Lurene Shadow, MD  furosemide (LASIX) 20 MG tablet Take 20 mg by mouth daily.   Yes [provider]  gabapentin (NEURONTIN) 300 MG capsule Take 300 mg by mouth at bedtime.   Yes [provider]  hydrALAZINE (APRESOLINE) 25 MG tablet Take 25 mg by mouth 3 (three) times daily.   Yes [provider]  isosorbide mononitrate (IMDUR) 30 MG 24 hr tablet Take 30 mg by mouth daily.   Yes [provider]  metoprolol succinate (TOPROL-XL) 25 MG 24 hr tablet Take 25 mg by mouth daily.   Yes Rockey Situ, NP  spironolactone (ALDACTONE) 25 MG tablet Take 0.5 tablets (12.5 mg total) by mouth daily. Patient taking differently: Take 25 mg by mouth daily.  03/08/20  Yes Lurene Shadow, MD  thiamine 100 MG tablet Take  1 tablet (100 mg total) by mouth daily. 03/08/20  Yes Lurene Shadow, MD  vitamin B-12 (CYANOCOBALAMIN) 100 MCG tablet Take 100 mcg by mouth daily.    Yes [provider]  sacubitril-valsartan (ENTRESTO) 24-26 MG Take 1 tablet by mouth 2 (two) times daily. 09/04/20   Delma Freeze, FNP    Review of Systems  Constitutional: Negative for appetite change and fatigue.  HENT: Positive for rhinorrhea. Negative for congestion and sore throat.   Eyes: Negative.   Respiratory: Negative for cough and shortness of breath.   Cardiovascular: Negative for chest pain, palpitations and leg  swelling.  Gastrointestinal: Negative for abdominal distention and abdominal pain.  Endocrine: Negative.   Genitourinary: Negative.   Musculoskeletal: Positive for arthralgias (both legs and his hands). Negative for back pain and neck pain.  Skin: Negative.   Allergic/Immunologic: Negative.   Neurological: Negative for dizziness and light-headedness.  Hematological: Negative for adenopathy. Does not bruise/bleed easily.  Psychiatric/Behavioral: Negative for dysphoric mood and sleep disturbance (sleeping on 2 pillows). The patient is not nervous/anxious.    Vitals:   09/04/20 1107  BP: 129/66  Pulse: 80  Resp: 20  SpO2: 99%  Weight: 192 lb (87.1 kg)  Height: 5\' 10"  (1.778 m)   Wt Readings from Last 3 Encounters:  09/04/20 192 lb (87.1 kg)  05/29/20 175 lb 6 oz (79.5 kg)  04/30/20 174 lb 2 oz (79 kg)   Lab Results  Component Value Date   CREATININE 1.01 05/29/2020   CREATININE 0.95 04/14/2020   CREATININE 1.18 03/03/2020     Physical Exam Vitals and nursing note reviewed.  Constitutional:      Appearance: He is well-developed.  HENT:     Head: Normocephalic and atraumatic.  Neck:     Vascular: No JVD.  Cardiovascular:     Rate and Rhythm: Normal rate and regular rhythm.  Pulmonary:     Effort: Pulmonary effort is normal. No respiratory distress.     Breath sounds: No wheezing, rhonchi or rales.  Abdominal:     Tenderness: There is no abdominal tenderness.     Comments: Slight rigidity noted to abdomen. Patient was recently started on lasix  Musculoskeletal:     Cervical back: Normal range of motion and neck supple.     Right lower leg: No edema.     Left lower leg: No edema.  Neurological:     General: No focal deficit present.     Mental Status: He is alert and oriented to person, place, and time.  Psychiatric:        Mood and Affect: Mood normal.        Behavior: Behavior normal.    Assessment & Plan:  1: Chronic heart failure with reduced ejection  fraction- - NYHA class I - euvolemic today - weighing daily; reminded to call for an overnight weight gain of >2 pounds or a weekly weight gain of >5 pounds - not adding salt but does rely on others bringing food or eating convenient food  - saw cardiology 05/03/2020) 08/20/20  - entresto 24/26mg  to be increased at this visit to Entresto 49-51 - BNP 02/20/20 was 532.0  2: HTN- - BP looks good today - follows with PCP at [facility])  - BMP on 08/20/20 reviewed and showed sodium 138, potassium 5.1, creatinine 1.1 and GFR 82  3. Leg and Hand Pain -Patient's aunt had medication bottle from [facility] that showed 90 day supply of Gabapentin 300mg x1  daily. Patient reports this is not helping the pain at all. -Patient instructed to follow up with [facility] -Encouraged trying topical Voltaren gel for possible pain relief to feet and legs.   Medication bottles reviewed.   Return in 3 months or sooner for any questions/problems before then.

## 2020-09-04 NOTE — Patient Instructions (Addendum)
Entresto: Increasing dose Take 4 tablets a day (2 in the morning and 2 in the evening) until current supply runs out. Sending in a new prescription and when that comes in it will be the updated dose and you will return to 1 pill in the morning and 1 in the afternoon.   Follow up with [facility] regarding leg and hand pain.   Try using Voltaren Gel on legs/feet at night

## 2020-09-24 DIAGNOSIS — I779 Disorder of arteries and arterioles, unspecified: Secondary | ICD-10-CM | POA: Insufficient documentation

## 2020-10-01 ENCOUNTER — Ambulatory Visit: Payer: Self-pay | Admitting: Family

## 2020-10-08 IMAGING — DX DG TIBIA/FIBULA 2V*L*
4 series · 4 of 4 positions shown · non-contrast
Comparison: 02/13/2020

CLINICAL DATA: Bilateral leg pain, right greater than left. History
of bilateral DVT

EXAM:
RIGHT FOOT COMPLETE - 3+ VIEW; LEFT TIBIA AND FIBULA - 2 VIEW; RIGHT
TIBIA AND FIBULA - 2 VIEW; LEFT FOOT - COMPLETE 3+ VIEW

[tibia ap (1 of 2)]
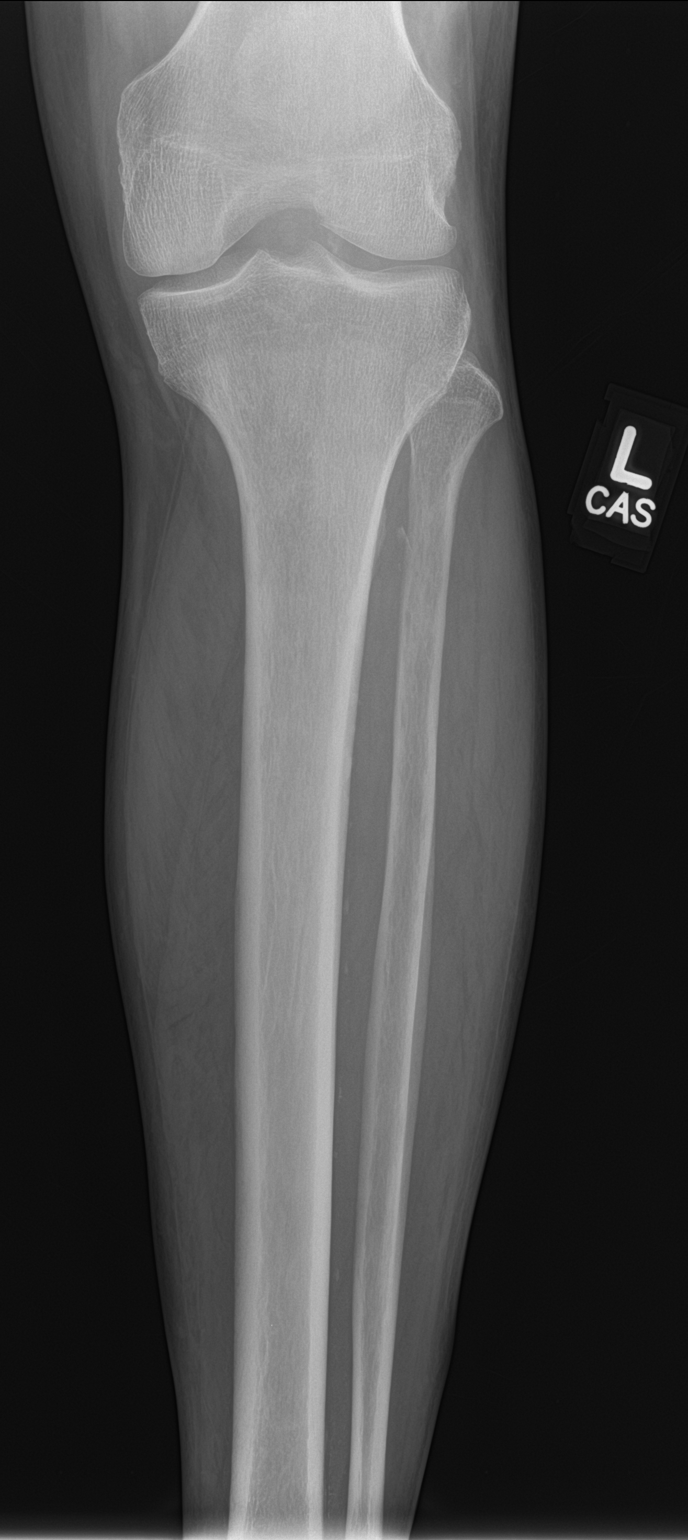

[tibia ap (2 of 2)]
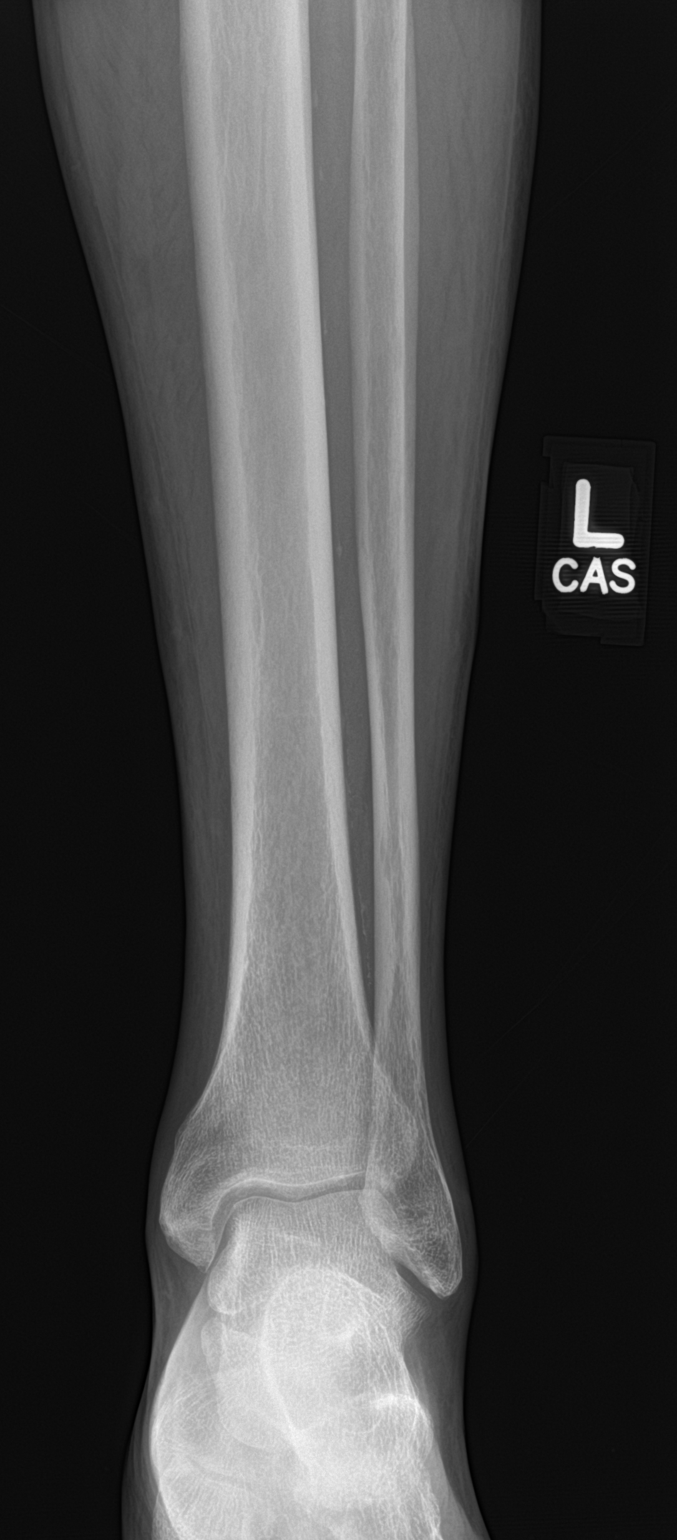

[tibia lat (1 of 2)]
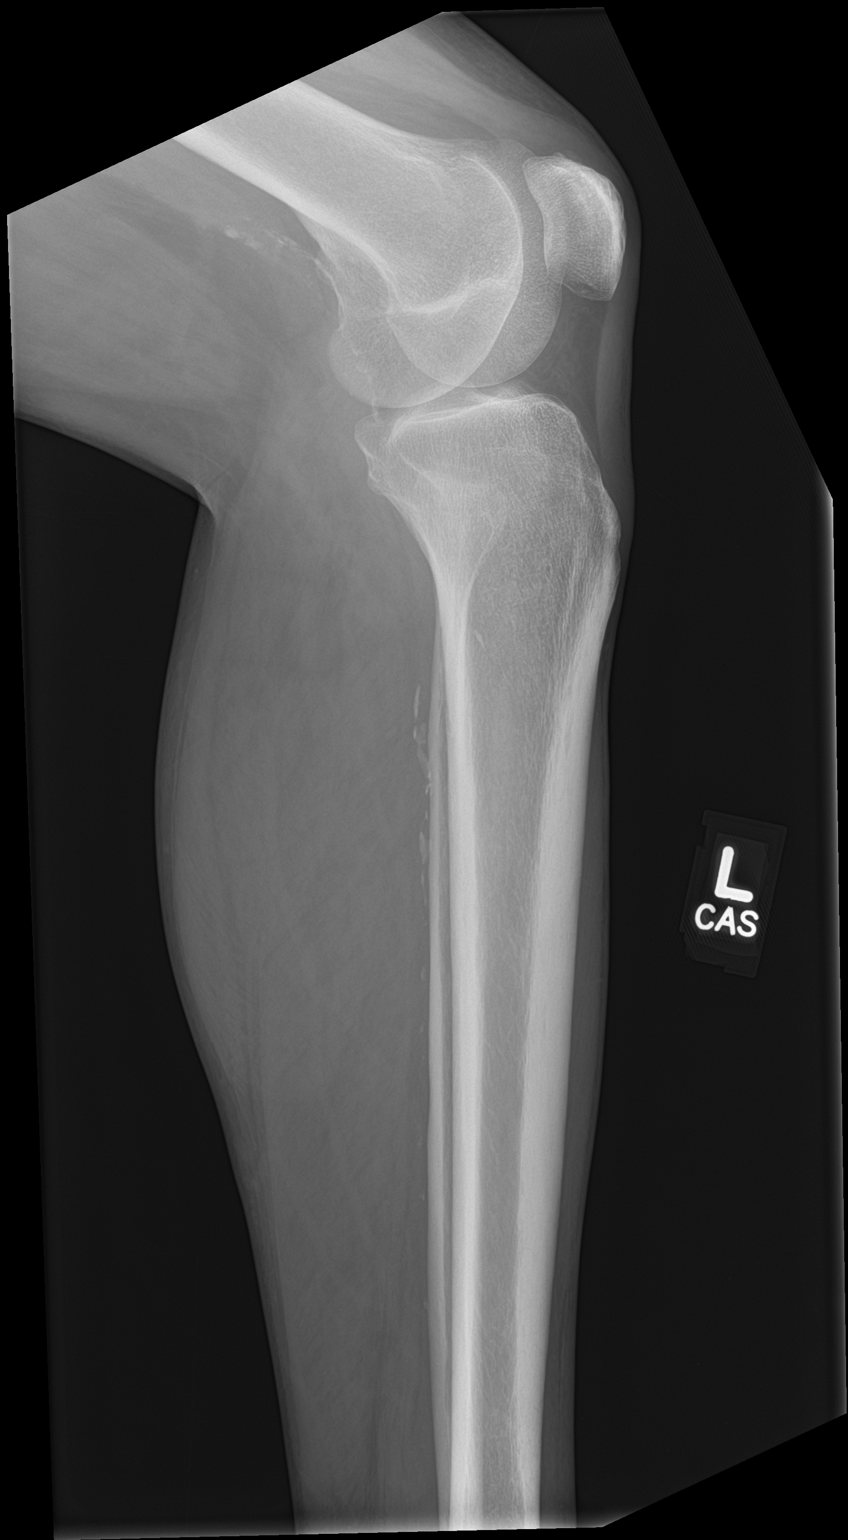

[tibia lat (2 of 2)]
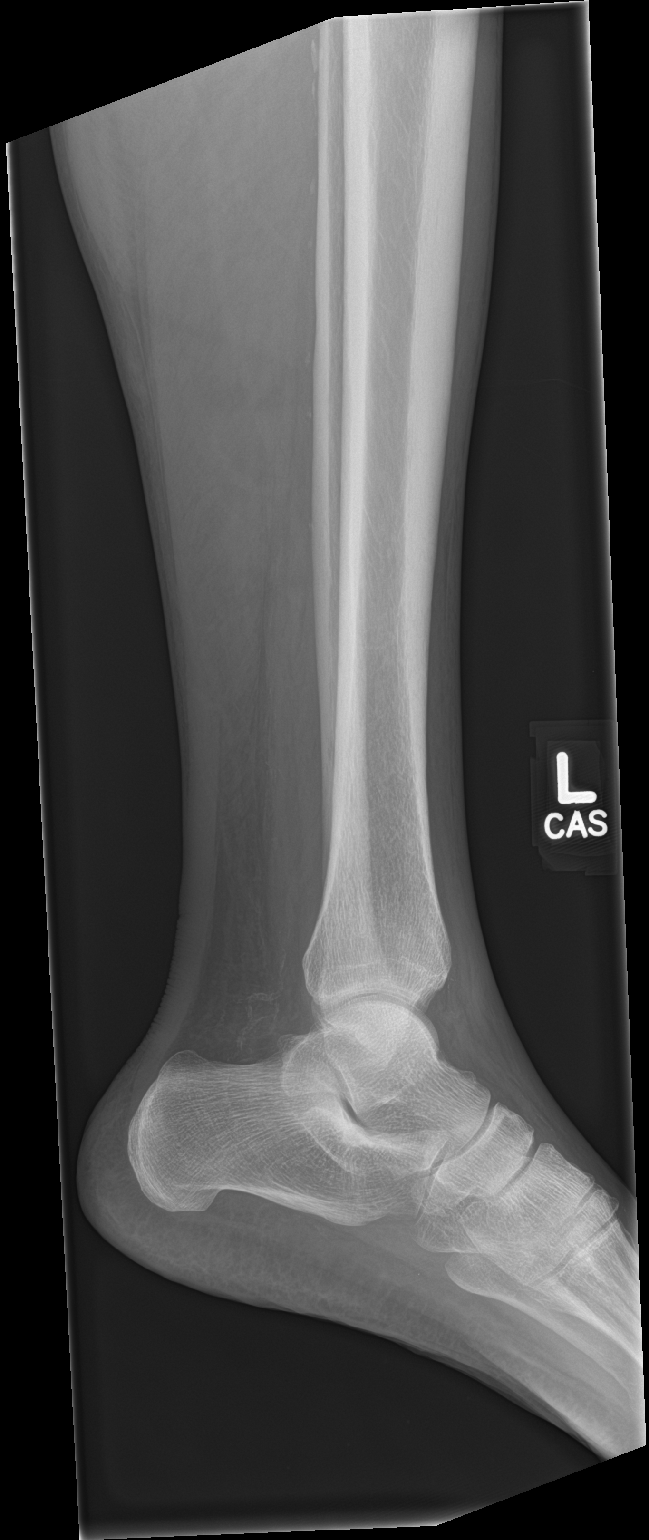

[4 of 4 positions shown; findings below may reference images not displayed]

FINDINGS: Right tibia/fibula: Frontal and lateral views demonstrate no
fractures. Alignment of the right knee and ankle is anatomic. Soft
tissues are grossly normal.

Right foot: Frontal, oblique, and lateral views of the right foot
demonstrate no fractures. Alignment is anatomic. Joint spaces are
relatively well preserved. Soft tissues are normal.

Left tibia/fibula: Frontal and lateral views demonstrate no
fractures. Alignment of the left knee and ankle is anatomic. Soft
tissues are grossly normal.

Left foot: Frontal, oblique, and lateral views demonstrate no
fractures. Alignment is anatomic. Joint spaces are well preserved.
Soft tissues are normal.
IMPRESSION: 1. No abnormalities of the right tibia/fibula, right foot, left
tibia/fibula, or left foot.

## 2020-10-08 IMAGING — DX DG TIBIA/FIBULA 2V*R*
4 series · 4 of 4 positions shown · non-contrast
Comparison: 02/13/2020

CLINICAL DATA: Bilateral leg pain, right greater than left. History
of bilateral DVT

EXAM:
RIGHT FOOT COMPLETE - 3+ VIEW; LEFT TIBIA AND FIBULA - 2 VIEW; RIGHT
TIBIA AND FIBULA - 2 VIEW; LEFT FOOT - COMPLETE 3+ VIEW

[tibia ap (1 of 2)]
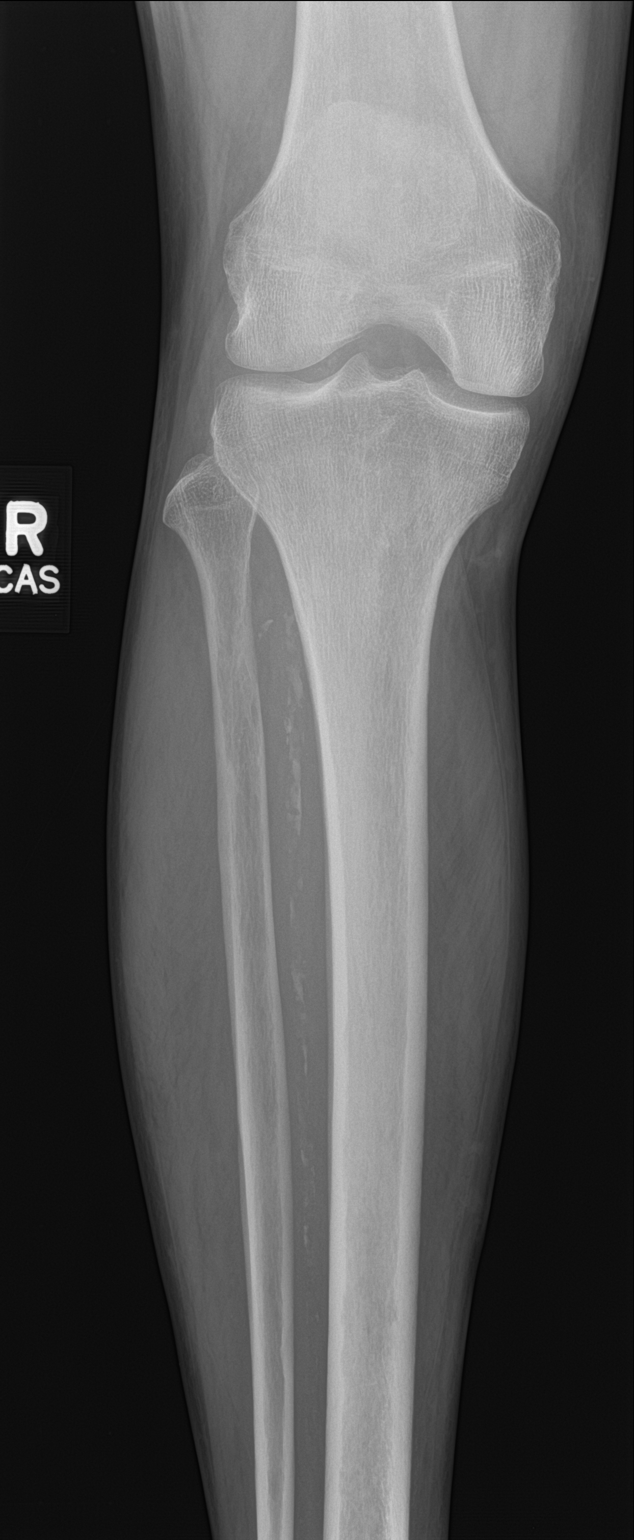

[tibia ap (2 of 2)]
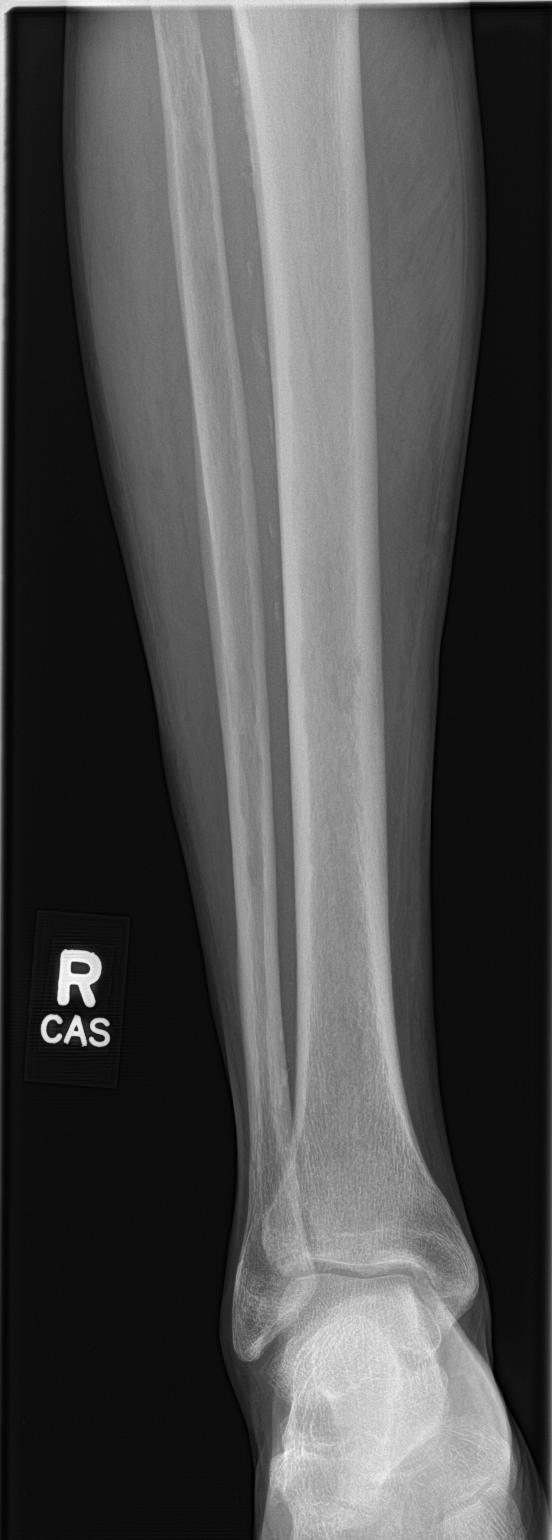

[tibia lat (1 of 2)]
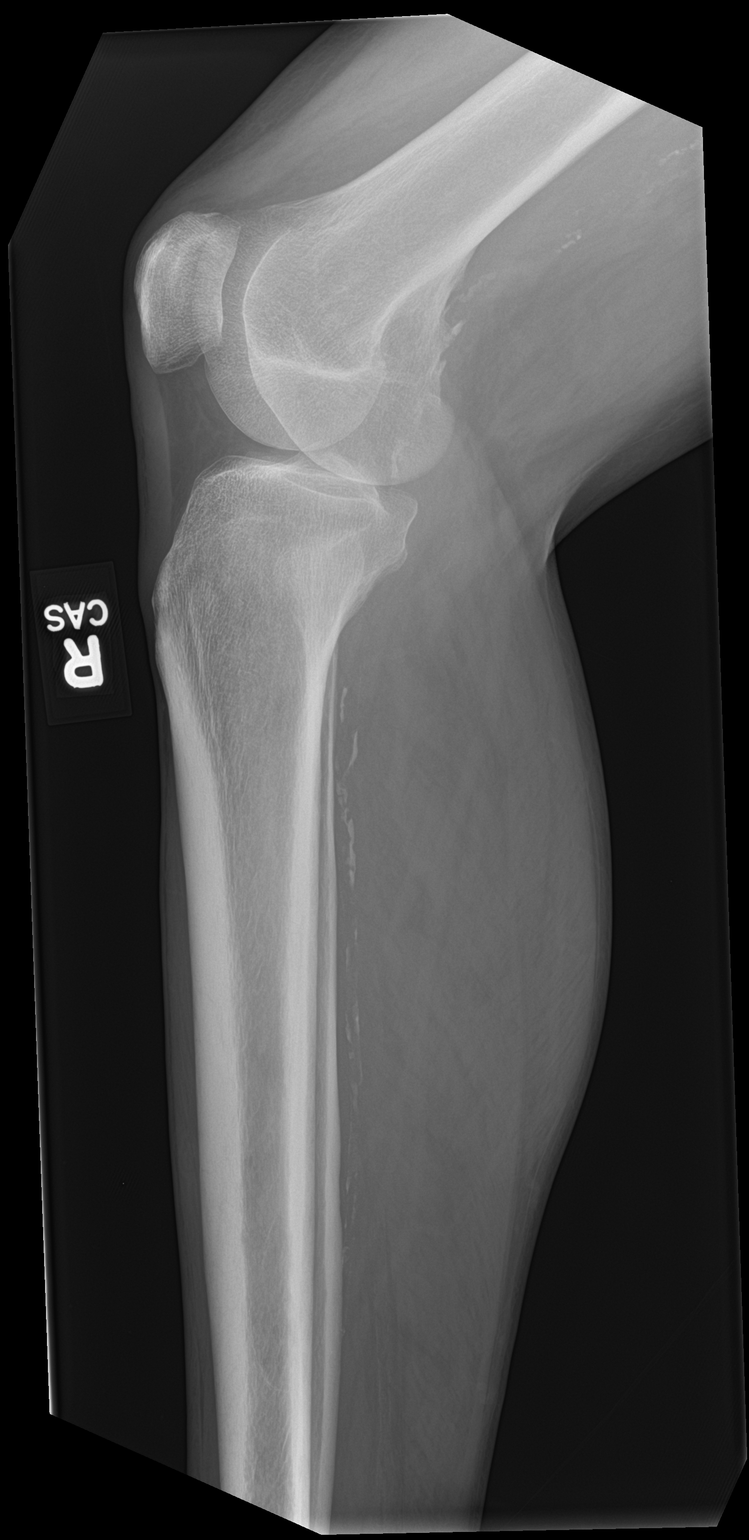

[tibia lat (2 of 2)]
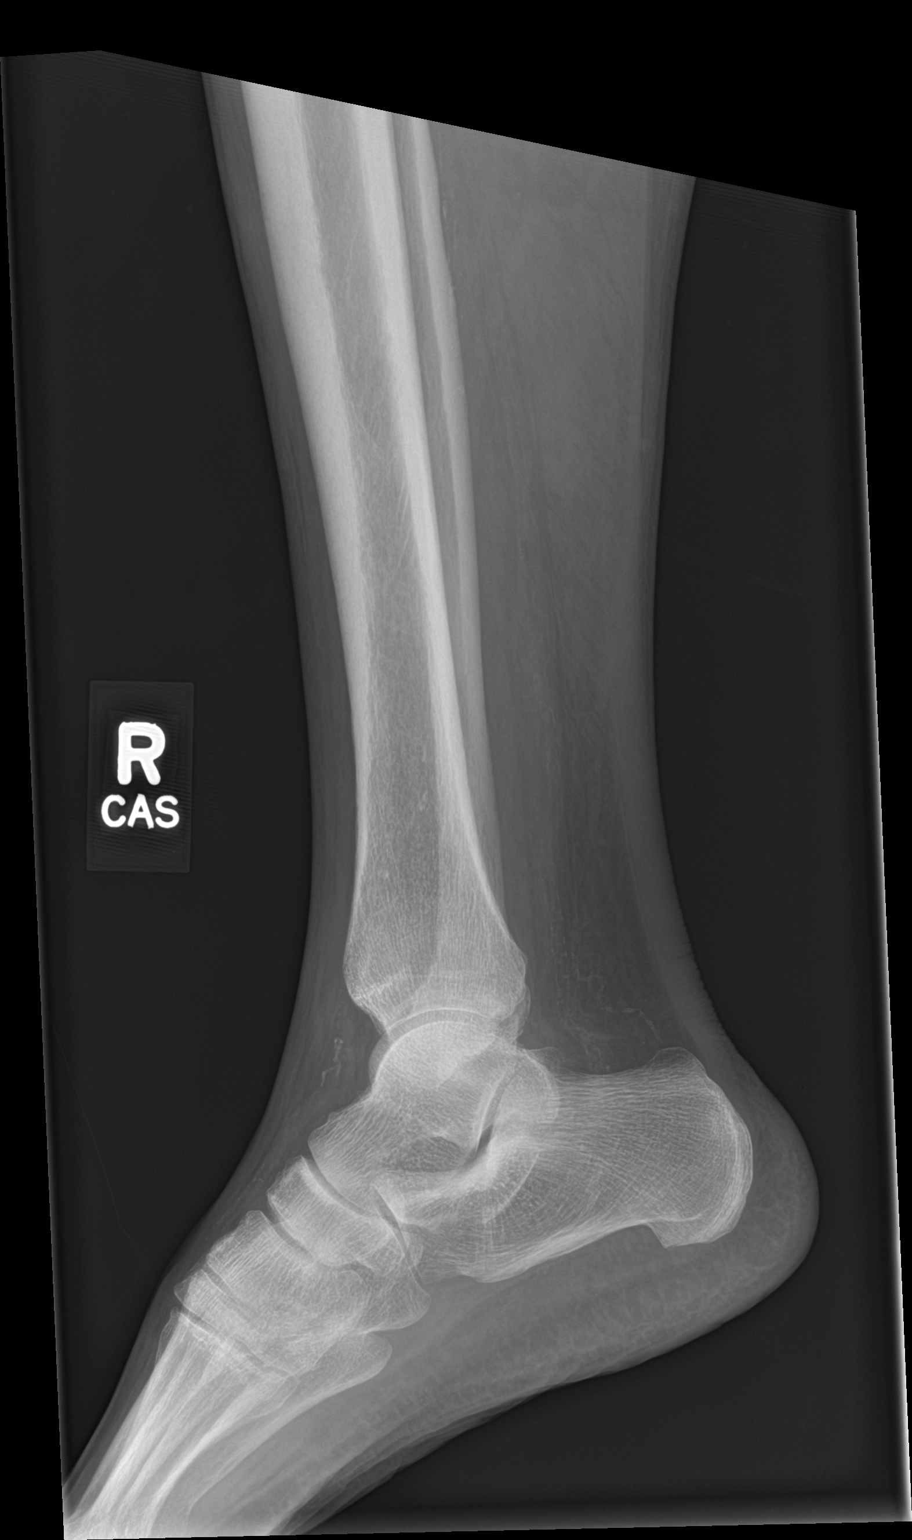

[4 of 4 positions shown; findings below may reference images not displayed]

FINDINGS: Right tibia/fibula: Frontal and lateral views demonstrate no
fractures. Alignment of the right knee and ankle is anatomic. Soft
tissues are grossly normal.

Right foot: Frontal, oblique, and lateral views of the right foot
demonstrate no fractures. Alignment is anatomic. Joint spaces are
relatively well preserved. Soft tissues are normal.

Left tibia/fibula: Frontal and lateral views demonstrate no
fractures. Alignment of the left knee and ankle is anatomic. Soft
tissues are grossly normal.

Left foot: Frontal, oblique, and lateral views demonstrate no
fractures. Alignment is anatomic. Joint spaces are well preserved.
Soft tissues are normal.
IMPRESSION: 1. No abnormalities of the right tibia/fibula, right foot, left
tibia/fibula, or left foot.

## 2020-10-09 ENCOUNTER — Ambulatory Visit: Payer: Medicaid Other | Attending: Family | Admitting: Family

## 2020-10-09 ENCOUNTER — Encounter: Payer: Self-pay | Admitting: Family

## 2020-10-09 ENCOUNTER — Other Ambulatory Visit: Payer: Self-pay

## 2020-10-09 VITALS — BP 112/67 | HR 81 | Resp 18 | Ht 70.0 in | Wt 198.0 lb

## 2020-10-09 DIAGNOSIS — I5022 Chronic systolic (congestive) heart failure: Secondary | ICD-10-CM

## 2020-10-09 DIAGNOSIS — M79604 Pain in right leg: Secondary | ICD-10-CM

## 2020-10-09 DIAGNOSIS — Z86718 Personal history of other venous thrombosis and embolism: Secondary | ICD-10-CM | POA: Diagnosis not present

## 2020-10-09 DIAGNOSIS — Z79899 Other long term (current) drug therapy: Secondary | ICD-10-CM | POA: Insufficient documentation

## 2020-10-09 DIAGNOSIS — I110 Hypertensive heart disease with heart failure: Secondary | ICD-10-CM | POA: Insufficient documentation

## 2020-10-09 DIAGNOSIS — Z7901 Long term (current) use of anticoagulants: Secondary | ICD-10-CM | POA: Diagnosis not present

## 2020-10-09 DIAGNOSIS — M79606 Pain in leg, unspecified: Secondary | ICD-10-CM | POA: Insufficient documentation

## 2020-10-09 DIAGNOSIS — M79643 Pain in unspecified hand: Secondary | ICD-10-CM | POA: Insufficient documentation

## 2020-10-09 DIAGNOSIS — I4892 Unspecified atrial flutter: Secondary | ICD-10-CM | POA: Diagnosis not present

## 2020-10-09 DIAGNOSIS — Z87891 Personal history of nicotine dependence: Secondary | ICD-10-CM | POA: Insufficient documentation

## 2020-10-09 DIAGNOSIS — M79605 Pain in left leg: Secondary | ICD-10-CM

## 2020-10-09 DIAGNOSIS — I10 Essential (primary) hypertension: Secondary | ICD-10-CM

## 2020-10-09 NOTE — Patient Instructions (Signed)
If you gain more than 2lbs overnight or 5lbs in a week call us.

## 2020-10-09 NOTE — Progress Notes (Signed)
Patient ID: Shawn Taylor, male    DOB: 1956-11-16, 64 y.o.   MRN: [id number]  Congestive Heart Failure Pertinent negatives include no abdominal pain, chest pain, fatigue, palpitations or shortness of breath.    Shawn Taylor is a 64 y/o male with a history of HTN, atrial flutter, DVT, thrombocytopenia, alcohol use,Current tobacco use and chronic heart failure.   Echo report from 02/14/20 reviewed and showed an EF of <20% along with moderate Shawn/TR.   Was in the ED 04/14/20 due to leg pain. Lower extremity xrays were negative. Treated and released. Admitted 02/13/20 due to shortness of breath and palpitations. Cardiology, urology and palliative care consults obtained. Noted to be in new atrial flutter with RVR. Given IV diltiazem drip, amiodarone drip and Lasix. Developed cardiac arrest leading to intubation and new diagnosis of bilateral DVT. Then developed significant thrombocytopenia post heparin infusion with negative HIT serologies. PT/OT evaluations done. Discharged after 23 days.  He presents today for a follow-up visit with a chief complaint of arm and leg pain. He describes this as chronic in nature having been present for several months. He has associated pins and needles feeling to feet bilaterally as well as legs up to his knees and hands. Patient reports the pain is relieved with rest and having no pressure applied to the legs. Patient reports he is taking gabapentin without relief and they have scheduled him a "leg scan".   Has tolerated entresto without known side effects. Has received the medication through [employer] patient assistance program.Patient brings his aunt as a support as she assists in managing his medication. Patient reports  is smoking 1 cigarette daily.   Past Medical History:  Diagnosis Date  . Atrial flutter (HCC)   . CHF (congestive heart failure) (HCC)   . DVT (deep venous thrombosis) (HCC)   . Hypertension   . Migraines   . Thrombocytopenia (HCC)    No past  surgical history on file. Family History  Problem Relation Age of Onset  . Diabetes Sister    Social History   Tobacco Use  . Smoking status: Former Smoker    Packs/day: 0.50    Types: Cigarettes    Quit date: 02/13/2020    Years since quitting: 0.6  . Smokeless tobacco: Never Used  Substance Use Topics  . Alcohol use: Not Currently    Alcohol/week: 2.0 - 3.0 standard drinks    Types: 2 - 3 Cans of beer per week   No Known Allergies  Prior to Admission medications   Medication Sig Start Date End Date Taking? Authorizing Provider  apixaban (ELIQUIS) 5 MG TABS tablet Take 1 tablet (5 mg total) by mouth 2 (two) times daily. 03/07/20  Yes Lurene Shadow, MD  atorvastatin (LIPITOR) 40 MG tablet Take 1 tablet (40 mg total) by mouth daily at 10 pm. 03/07/20  Yes Lurene Shadow, MD  furosemide (LASIX) 20 MG tablet Take 20 mg by mouth daily.   Yes [provider]  gabapentin (NEURONTIN) 300 MG capsule Take 300 mg by mouth at bedtime.   Yes [provider]  hydrALAZINE (APRESOLINE) 25 MG tablet Take 25 mg by mouth 3 (three) times daily.   Yes [provider]  isosorbide mononitrate (IMDUR) 30 MG 24 hr tablet Take 30 mg by mouth daily.   Yes [provider]  metoprolol succinate (TOPROL-XL) 25 MG 24 hr tablet Take 25 mg by mouth daily.   Yes Rockey Situ, NP  spironolactone (ALDACTONE) 25 MG tablet Take 0.5  tablets (12.5 mg total) by mouth daily. Patient taking differently: Take 25 mg by mouth daily.  03/08/20  Yes Lurene Shadow, MD  thiamine 100 MG tablet Take 1 tablet (100 mg total) by mouth daily. 03/08/20  Yes Lurene Shadow, MD  vitamin B-12 (CYANOCOBALAMIN) 100 MCG tablet Take 100 mcg by mouth daily.    Yes [provider]  sacubitril-valsartan (ENTRESTO) 24-26 MG Take 1 tablet by mouth 2 (two) times daily. 09/04/20   Delma Freeze, FNP    Review of Systems  Constitutional: Negative for appetite change and fatigue.  HENT: Positive for  rhinorrhea. Negative for congestion and sore throat.   Eyes: Negative.   Respiratory: Positive for cough (Dry cough on and off during allergy season). Negative for shortness of breath.   Cardiovascular: Negative for chest pain, palpitations and leg swelling.  Gastrointestinal: Negative for abdominal distention and abdominal pain.  Endocrine: Negative.   Genitourinary: Negative.   Musculoskeletal: Positive for arthralgias (both legs and his hands). Negative for back pain and neck pain.  Skin: Negative.   Allergic/Immunologic: Negative.   Neurological: Negative for dizziness, tremors, syncope, weakness and light-headedness.  Hematological: Negative for adenopathy. Does not bruise/bleed easily.  Psychiatric/Behavioral: Negative for dysphoric mood and sleep disturbance (sleeping on 2 pillows). The patient is not nervous/anxious.    Vitals:   10/09/20 1006  BP: 112/67  Pulse: 81  Resp: 18  SpO2: 99%  Weight: 198 lb (89.8 kg)  Height: 5\' 10"  (1.778 m)   Wt Readings from Last 3 Encounters:  10/09/20 198 lb (89.8 kg)  09/04/20 192 lb (87.1 kg)  05/29/20 175 lb 6 oz (79.5 kg)   Lab Results  Component Value Date   CREATININE 1.01 05/29/2020   CREATININE 0.95 04/14/2020   CREATININE 1.18 03/03/2020     Physical Exam Vitals and nursing note reviewed.  Constitutional:      General: He is not in acute distress.    Appearance: Normal appearance. He is well-developed. He is not ill-appearing, toxic-appearing or diaphoretic.  HENT:     Head: Normocephalic and atraumatic.  Neck:     Vascular: No JVD.  Cardiovascular:     Rate and Rhythm: Normal rate and regular rhythm.  Pulmonary:     Effort: Pulmonary effort is normal. No respiratory distress.     Breath sounds: No wheezing, rhonchi or rales.  Abdominal:     Tenderness: There is no abdominal tenderness.  Musculoskeletal:     Cervical back: Normal range of motion and neck supple.     Right lower leg: No edema.     Left lower leg:  No edema.  Neurological:     General: No focal deficit present.     Mental Status: He is alert and oriented to person, place, and time.  Psychiatric:        Mood and Affect: Mood normal.        Behavior: Behavior normal.    Assessment & Plan:  1: Chronic heart failure with reduced ejection fraction- - NYHA class I - euvolemic today - weighing daily; reminded to call for an overnight weight gain of >2 pounds or a weekly weight gain of >5 pounds - weight up 6 pounds since last visit here 1 month ago - not adding salt but does rely on others bringing food or eating convenient food  - saw cardiology 05/03/2020) 09/24/20  - entresto 49/51, tolerating well at this time. Patient's blood pressure does not allow for titration at this time.  -  BNP 02/20/20 was 532.0 - cardiac MRI scheduled for December 2021  2: HTN- - BP looks good today (112/67), no reported dizziness or syncopal events - follows with PCP at [facility]), has still not had an appointment with them   - BMP on 09/24/20 reviewed and showed sodium 137, potassium 4.7, creatinine 1. and GFR 91  3. Leg and Hand Pain -Patient's aunt had medication bottle from [facility] that showed 90 day supply of Gabapentin 300mg x1 daily. Patient reports this is not helping the pain at all. -Patient instructed to follow up with [facility] -Patient has a doppler scheduled for the 19th to rule out intermittent claudication  -Patient reports his leg pain prevents him from doing much exercise which has contributed to his weight gain   Medication list reviewed.   Return in 3 months or sooner for any questions/problems before then.

## 2020-11-02 ENCOUNTER — Other Ambulatory Visit: Payer: Self-pay | Admitting: Gastroenterology

## 2020-11-02 DIAGNOSIS — R748 Abnormal levels of other serum enzymes: Secondary | ICD-10-CM

## 2020-11-09 ENCOUNTER — Ambulatory Visit: Payer: Self-pay

## 2020-11-12 ENCOUNTER — Encounter (INDEPENDENT_AMBULATORY_CARE_PROVIDER_SITE_OTHER): Payer: Self-pay | Admitting: Vascular Surgery

## 2020-12-03 ENCOUNTER — Other Ambulatory Visit: Payer: Self-pay

## 2020-12-03 ENCOUNTER — Ambulatory Visit (INDEPENDENT_AMBULATORY_CARE_PROVIDER_SITE_OTHER): Payer: Medicaid Other | Admitting: Vascular Surgery

## 2020-12-03 ENCOUNTER — Encounter (INDEPENDENT_AMBULATORY_CARE_PROVIDER_SITE_OTHER): Payer: Self-pay | Admitting: Vascular Surgery

## 2020-12-03 VITALS — BP 100/62 | HR 91 | Ht 70.0 in | Wt 205.0 lb

## 2020-12-03 DIAGNOSIS — I70213 Atherosclerosis of native arteries of extremities with intermittent claudication, bilateral legs: Secondary | ICD-10-CM

## 2020-12-03 DIAGNOSIS — M792 Neuralgia and neuritis, unspecified: Secondary | ICD-10-CM | POA: Diagnosis not present

## 2020-12-03 DIAGNOSIS — I10 Essential (primary) hypertension: Secondary | ICD-10-CM | POA: Diagnosis not present

## 2020-12-03 DIAGNOSIS — I4892 Unspecified atrial flutter: Secondary | ICD-10-CM

## 2020-12-03 DIAGNOSIS — E785 Hyperlipidemia, unspecified: Secondary | ICD-10-CM | POA: Insufficient documentation

## 2020-12-03 DIAGNOSIS — E782 Mixed hyperlipidemia: Secondary | ICD-10-CM

## 2020-12-03 DIAGNOSIS — I6523 Occlusion and stenosis of bilateral carotid arteries: Secondary | ICD-10-CM | POA: Diagnosis not present

## 2020-12-03 DIAGNOSIS — I70219 Atherosclerosis of native arteries of extremities with intermittent claudication, unspecified extremity: Secondary | ICD-10-CM | POA: Insufficient documentation

## 2020-12-03 NOTE — Progress Notes (Signed)
MRN : [id number]  Shawn Taylor is a 65 y.o. ([birth year]) male who presents with chief complaint of  Chief Complaint  Patient presents with  . New Patient (Initial Visit)  . PVD  .  History of Present Illness:    The patient is seen for evaluation of painful lower extremities and diminished pulses. Patient notes the pain is always associated with activity and is very consistent day today. Typically, the pain occurs at less than one block, progress is as activity continues to the point that the patient must stop walking.  Right leg is worse than the left.   Resting including standing still for several minutes allowed resumption of the activity and the ability to walk a similar distance before stopping again. Uneven terrain and inclined shorten the distance. The pain has been progressive over the past several years. The patient states the inability to walk is now having a profound negative impact on quality of life and daily activities.  The patient denies rest pain or dangling of an extremity off the side of the bed during the night for relief. No open wounds or sores at this time. No prior interventions or surgeries.  He is also c/o numbness and pins/needle like sensation of the left hand.  No history of back problems or DJD of the lumbar sacral spine.   The patient denies changes in claudication symptoms or new rest pain symptoms.  No new ulcers or wounds of the foot.  The patient's blood pressure has been stable and relatively well controlled. The patient denies amaurosis fugax or recent TIA symptoms. There are no recent neurological changes noted. The patient denies history of DVT, PE or superficial thrombophlebitis. The patient denies recent episodes of angina or shortness of breath.   Current Meds  Medication Sig  . apixaban (ELIQUIS) 5 MG TABS tablet Take 1 tablet (5 mg total) by mouth 2 (two) times daily.  Marland Kitchen aspirin 81 MG EC tablet Take by mouth.  Marland Kitchen atorvastatin (LIPITOR)  40 MG tablet Take 1 tablet (40 mg total) by mouth daily at 10 pm.  . dapagliflozin propanediol (FARXIGA) 10 MG TABS tablet Take 10 mg by mouth daily.  . folic acid (FOLVITE) 1 MG tablet Take by mouth.  . furosemide (LASIX) 20 MG tablet Take 20 mg by mouth daily.  Marland Kitchen gabapentin (NEURONTIN) 300 MG capsule Take 300 mg by mouth at bedtime.  . hydrALAZINE (APRESOLINE) 25 MG tablet Take 25 mg by mouth 3 (three) times daily.  . isosorbide mononitrate (IMDUR) 30 MG 24 hr tablet Take 30 mg by mouth daily.  . metoprolol succinate (TOPROL-XL) 25 MG 24 hr tablet Take 25 mg by mouth daily.  . sacubitril-valsartan (ENTRESTO) 49-51 MG Take 1 tablet by mouth 2 (two) times daily.  Marland Kitchen spironolactone (ALDACTONE) 25 MG tablet Take 0.5 tablets (12.5 mg total) by mouth daily. (Patient taking differently: Take 25 mg by mouth daily.)  . thiamine 100 MG tablet Take 1 tablet (100 mg total) by mouth daily.  . vitamin B-12 (CYANOCOBALAMIN) 100 MCG tablet Take 100 mcg by mouth daily.     Past Medical History:  Diagnosis Date  . Atrial flutter (HCC)   . CHF (congestive heart failure) (HCC)   . DVT (deep venous thrombosis) (HCC)   . Hypertension   . Migraines   . Thrombocytopenia (HCC)     No past surgical history on file.  Social History Social History   Tobacco Use  . Smoking status: Former Smoker  Packs/day: 0.50    Types: Cigarettes    Quit date: 02/13/2020    Years since quitting: 0.8  . Smokeless tobacco: Never Used  Substance Use Topics  . Alcohol use: Not Currently    Alcohol/week: 2.0 - 3.0 standard drinks    Types: 2 - 3 Cans of beer per week  . Drug use: Not Currently    Frequency: 1.0 times per week    Types: Cocaine    Family History Family History  Problem Relation Age of Onset  . Diabetes Sister   No family history of bleeding/clotting disorders, porphyria or autoimmune disease   No Known Allergies   REVIEW OF SYSTEMS (Negative unless checked)  Constitutional: [] Weight loss   [] Fever  [] Chills Cardiac: [] Chest pain   [] Chest pressure   [] Palpitations   [] Shortness of breath when laying flat   [] Shortness of breath with exertion. Vascular:  [x] Pain in legs with walking   [] Pain in legs at rest  [] History of DVT   [] Phlebitis   [] Swelling in legs   [] Varicose veins   [] Non-healing ulcers Pulmonary:   [] Uses home oxygen   [] Productive cough   [] Hemoptysis   [] Wheeze  [] COPD   [] Asthma Neurologic:  [] Dizziness   [] Seizures   [] History of stroke   [] History of TIA  [] Aphasia   [] Vissual changes   [x] Weakness or numbness in arm   [x] Weakness or numbness in leg Musculoskeletal:   [] Joint swelling   [x] Joint pain   [] Low back pain Hematologic:  [] Easy bruising  [] Easy bleeding   [] Hypercoagulable state   [] Anemic Gastrointestinal:  [] Diarrhea   [] Vomiting  [] Gastroesophageal reflux/heartburn   [] Difficulty swallowing. Genitourinary:  [] Chronic kidney disease   [] Difficult urination  [] Frequent urination   [] Blood in urine Skin:  [] Rashes   [] Ulcers  Psychological:  [] History of anxiety   []  History of major depression.  Physical Examination  Vitals:   12/03/20 1101  BP: 100/62  Pulse: 91  Weight: 205 lb (93 kg)  Height: 5\' 10"  (1.778 m)   Body mass index is 29.41 kg/m. Gen: WD/WN, NAD Head: [place]/AT, No temporalis wasting.  Ear/Nose/Throat: Hearing grossly intact, nares w/o erythema or drainage, poor dentition Eyes: PER, EOMI, sclera nonicteric.  Neck: Supple, no masses.  No bruit or JVD.  Pulmonary:  Good air movement, clear to auscultation bilaterally, no use of accessory muscles.  Cardiac: RRR, normal S1, S2, no Murmurs. Vascular: left carotid bruit Vessel Right Left  Radial Palpable Palpable  Carotid Palpable Palpable  PT Not Palpable Not Palpable  DP Not Palpable Not Palpable  Gastrointestinal: soft, non-distended. No guarding/no peritoneal signs.  Musculoskeletal: M/S 5/5 throughout.  No deformity or atrophy.  Neurologic: CN 2-12 intact. Pain and light  touch  intact in right upper and bilateral lower extremities, diminished in the left upper extremity.  Symmetrical.  Speech is fluent. Motor exam as listed above. Psychiatric: Judgment intact, Mood & affect appropriate for pt's clinical situation. Dermatologic: No rashes or ulcers noted.  No changes consistent with cellulitis.   CBC Lab Results  Component Value Date   WBC 12.1 (H) 04/14/2020   HGB 11.8 (L) 04/14/2020   HCT 35.3 (L) 04/14/2020   MCV 85.1 04/14/2020   PLT 337 04/14/2020    BMET    Component Value Date/Time   NA 138 05/29/2020 1254   K 4.3 05/29/2020 1254   CL 109 05/29/2020 1254   CO2 22 05/29/2020 1254   GLUCOSE 96 05/29/2020 1254   BUN 20 05/29/2020 1254  CREATININE 1.01 05/29/2020 1254   CALCIUM 9.4 05/29/2020 1254   GFRNONAA >60 05/29/2020 1254   GFRAA >60 05/29/2020 1254   CrCl cannot be calculated (Patient's most recent lab result is older than the maximum 21 days allowed.).  COAG Lab Results  Component Value Date   INR 2.3 (H) 02/14/2020    Radiology No results found.   Assessment/Plan 1. Atherosclerosis of native artery of both lower extremities with intermittent claudication (HCC) Recommend:  Patient should undergo arterial duplex of the lower extremity because there has been a significant progression in the patient's lower extremity symptoms.  The patient states they are having increased pain and a marked decrease in the distance that they can walk.  The risks and benefits as well as the alternatives were discussed in detail with the patient.  All questions were answered.  Patient agrees to proceed and understands this could be a prelude to angiography and intervention.  The patient will follow up with me in the office to review the studies.   - VAS [place] LOWER EXTREMITY ARTERIAL DUPLEX; Future  2. Bilateral carotid artery stenosis Recommend:  Given the patient's asymptomatic subcritical stenosis no further invasive testing or surgery at  this time.  Duplex ultrasound is due.  Continue antiplatelet therapy as prescribed Continue management of CAD, HTN and Hyperlipidemia Healthy heart diet,  encouraged exercise at least 4 times per week Follow up in 1 month with duplex ultrasound and physical exam  - VAS US CAROTID; Future  3. Radicular pain in left arm I will ask Dr Myer Haff to evaluate him - Ambulatory referral to Neurosurgery  4. Benign essential HTN Continue antihypertensive medications as already ordered, these medications have been reviewed and there are no changes at this time.   5. Atrial flutter, unspecified type (HCC) Continue antiarrhythmia medications as already ordered, these medications have been reviewed and there are no changes at this time.  Continue anticoagulation as ordered by Cardiology Service   6. Mixed hyperlipidemia Continue statin as ordered and reviewed, no changes at this time      Levora Dredge, MD  12/03/2020 1:04 PM

## 2020-12-13 ENCOUNTER — Telehealth: Payer: Self-pay | Admitting: Pharmacy Technician

## 2020-12-13 NOTE — Telephone Encounter (Signed)
Patient has Medicaid with prescription drug coverage.  No longer meets [facility]'s eligibility criteria.  Shawn Taylor Care Manager [facility]   Shawn Taylor [address], [place]  [address]  December 13, 2020    Shawn Taylor [address] [place], [place]  [address]  Dear Shawn Taylor:  This is to inform you that you are no longer eligible to receive medication assistance at [facility].  The reason(s) are:    _____Your total gross monthly household income exceeds 250% of the Federal Poverty Level.   _____Tangible assets (savings, checking, stocks/bonds, pension, retirement, etc.) exceeds our limit  _____You are eligible to receive benefits from [facility], [facility] or HIV Medication            Assistance program _____You are eligible to receive benefits from a Medicare Part "D" plan __X__You have prescription insurance  _____You are not an [facility] resident _____Failure to provide all requested proof of income information for 2021.    We regret that we are unable to help you at this time.  If your prescription coverage is terminated, please contact [facility], so that we may reassess your eligibility for our program.  If you have questions, we may be contacted at [phone].  Thank you,  [facility]

## 2020-12-20 ENCOUNTER — Other Ambulatory Visit: Payer: Self-pay

## 2020-12-20 ENCOUNTER — Ambulatory Visit (INDEPENDENT_AMBULATORY_CARE_PROVIDER_SITE_OTHER): Payer: Medicaid Other | Admitting: Nurse Practitioner

## 2020-12-20 ENCOUNTER — Ambulatory Visit (INDEPENDENT_AMBULATORY_CARE_PROVIDER_SITE_OTHER): Payer: Medicaid Other

## 2020-12-20 ENCOUNTER — Encounter (INDEPENDENT_AMBULATORY_CARE_PROVIDER_SITE_OTHER): Payer: Self-pay | Admitting: Nurse Practitioner

## 2020-12-20 VITALS — BP 149/78 | HR 82 | Resp 16 | Wt 208.0 lb

## 2020-12-20 DIAGNOSIS — I6523 Occlusion and stenosis of bilateral carotid arteries: Secondary | ICD-10-CM

## 2020-12-20 DIAGNOSIS — I70213 Atherosclerosis of native arteries of extremities with intermittent claudication, bilateral legs: Secondary | ICD-10-CM

## 2020-12-20 DIAGNOSIS — I10 Essential (primary) hypertension: Secondary | ICD-10-CM

## 2020-12-20 DIAGNOSIS — E782 Mixed hyperlipidemia: Secondary | ICD-10-CM | POA: Diagnosis not present

## 2020-12-29 ENCOUNTER — Encounter (INDEPENDENT_AMBULATORY_CARE_PROVIDER_SITE_OTHER): Payer: Self-pay | Admitting: Nurse Practitioner

## 2020-12-29 NOTE — Progress Notes (Signed)
Subjective:    Patient ID: Shawn Taylor, male    DOB: 1956/06/26, 65 y.o.   MRN: [id number] Chief Complaint  Patient presents with  . Follow-up    Ultrasound follow up    The patient returns to the office for followup and review of the noninvasive studies. There has been a significant deterioration in the lower extremity symptoms.  The patient notes interval shortening of their claudication distance and development of mild rest pain symptoms. No new ulcers or wounds have occurred since the last visit.  Also the patient's last office visit bilateral carotid bruits were auscultated.  There have been no significant changes to the patient's overall health care.  The patient denies amaurosis fugax or recent TIA symptoms. There are no recent neurological changes noted. The patient denies history of DVT, PE or superficial thrombophlebitis. The patient denies recent episodes of angina or shortness of breath.   Currently ABIs were not done however the right lower extremity shows the mid and distal SFA is occluded with a collateral feeding to the popliteal artery.  There is monophasic flow seen distally from this point.  Left lower extremity has moderate atherosclerosis with patent vessels throughout however the profunda has a 50 to 74% stenosis.  Beginning at the proximal SFA the patient has monophasic waveforms distally with dampened waveforms.  Patient has 1 to 39% stenosis in the bilateral internal carotid arteries.   Review of Systems  Cardiovascular:       Claudication  All other systems reviewed and are negative.      Objective:   Physical Exam Vitals reviewed.  HENT:     Head: Normocephalic.  Cardiovascular:     Rate and Rhythm: Normal rate.     Pulses: Decreased pulses.          Carotid pulses are on the right side with bruit and on the left side with bruit. Pulmonary:     Effort: Pulmonary effort is normal.  Neurological:     Mental Status: He is oriented to person, place,  and time.  Psychiatric:        Mood and Affect: Mood normal.        Behavior: Behavior normal.        Thought Content: Thought content normal.        Judgment: Judgment normal.     BP (!) 149/78 (BP Location: Right Arm)   Pulse 82   Resp 16   Wt 208 lb (94.3 kg)   BMI 29.84 kg/m   Past Medical History:  Diagnosis Date  . Atrial flutter (HCC)   . CHF (congestive heart failure) (HCC)   . DVT (deep venous thrombosis) (HCC)   . Hypertension   . Migraines   . Thrombocytopenia (HCC)     Social History   Socioeconomic History  . Marital status: Single    Spouse name: Not on file  . Number of children: Not on file  . Years of education: Not on file  . Highest education level: Not on file  Occupational History  . Not on file  Tobacco Use  . Smoking status: Former Smoker    Packs/day: 0.50    Types: Cigarettes    Quit date: 02/13/2020    Years since quitting: 0.8  . Smokeless tobacco: Never Used  Substance and Sexual Activity  . Alcohol use: Not Currently    Alcohol/week: 2.0 - 3.0 standard drinks    Types: 2 - 3 Cans of beer per week  . Drug  use: Not Currently    Frequency: 1.0 times per week    Types: Cocaine  . Sexual activity: Not on file  Other Topics Concern  . Not on file  Social History Narrative  . Not on file   Social Determinants of Health   Financial Resource Strain: Not on file  Food Insecurity: Not on file  Transportation Needs: Not on file  Physical Activity: Not on file  Stress: Not on file  Social Connections: Not on file  Intimate Partner Violence: Not on file    History reviewed. No pertinent surgical history.  Family History  Problem Relation Age of Onset  . Diabetes Sister     No Known Allergies  CBC Latest Ref Rng & Units 04/14/2020 03/06/2020 03/03/2020  WBC 4.0 - 10.5 K/uL 12.1(H) 5.7 7.7  Hemoglobin 13.0 - 17.0 g/dL 11.8(L) 11.3(L) 11.5(L)  Hematocrit 39.0 - 52.0 % 35.3(L) 33.9(L) 35.7(L)  Platelets 150 - 400 K/uL 337 289  119(L)      CMP     Component Value Date/Time   NA 138 05/29/2020 1254   K 4.3 05/29/2020 1254   CL 109 05/29/2020 1254   CO2 22 05/29/2020 1254   GLUCOSE 96 05/29/2020 1254   BUN 20 05/29/2020 1254   CREATININE 1.01 05/29/2020 1254   CALCIUM 9.4 05/29/2020 1254   PROT 6.5 02/22/2020 0407   ALBUMIN 2.8 (L) 02/22/2020 0407   AST 101 (H) 02/22/2020 0407   ALT 251 (H) 02/22/2020 0407   ALKPHOS 91 02/22/2020 0407   BILITOT 4.4 (H) 02/22/2020 0407   GFRNONAA >60 05/29/2020 1254   GFRAA >60 05/29/2020 1254     No results found.     Assessment & Plan:   1. Atherosclerosis of native artery of both lower extremities with intermittent claudication (HCC) Recommend:  The patient has experienced increased symptoms and is now describing lifestyle limiting claudication and mild rest pain.   Given the severity of the patient's lower extremity symptoms the patient should undergo angiography and intervention.  Risk and benefits were reviewed the patient.  Indications for the procedure were reviewed.  All questions were answered, the patient agrees to proceed.   The patient should continue walking and begin a more formal exercise program.  The patient should continue antiplatelet therapy and aggressive treatment of the lipid abnormalities  The patient will follow up with me after the angiogram.   2. Bilateral carotid artery stenosis Recommend:  Given the patient's asymptomatic subcritical stenosis no further invasive testing or surgery at this time.  Duplex ultrasound shows <40% stenosis bilaterally.  Continue antiplatelet therapy as prescribed Continue management of CAD, HTN and Hyperlipidemia Healthy heart diet,  encouraged exercise at least 4 times per week Follow up in 12 months with duplex ultrasound and physical exam   3. Benign essential HTN Continue antihypertensive medications as already ordered, these medications have been reviewed and there are no changes at this  time.   4. Mixed hyperlipidemia Continue statin as ordered and reviewed, no changes at this time    Current Outpatient Medications on File Prior to Visit  Medication Sig Dispense Refill  . apixaban (ELIQUIS) 5 MG TABS tablet Take 1 tablet (5 mg total) by mouth 2 (two) times daily. 60 tablet 0  . aspirin 81 MG EC tablet Take by mouth.    Marland Kitchen atorvastatin (LIPITOR) 40 MG tablet Take 1 tablet (40 mg total) by mouth daily at 10 pm. 30 tablet 0  . dapagliflozin propanediol (FARXIGA) 10 MG TABS  tablet Take 10 mg by mouth daily.    . folic acid (FOLVITE) 1 MG tablet Take by mouth.    . furosemide (LASIX) 20 MG tablet Take 20 mg by mouth daily.    Marland Kitchen gabapentin (NEURONTIN) 300 MG capsule Take 300 mg by mouth at bedtime.    . hydrALAZINE (APRESOLINE) 25 MG tablet Take 25 mg by mouth 3 (three) times daily.    . isosorbide mononitrate (IMDUR) 30 MG 24 hr tablet Take 30 mg by mouth daily.    . metoprolol succinate (TOPROL-XL) 25 MG 24 hr tablet Take 25 mg by mouth daily.    . sacubitril-valsartan (ENTRESTO) 49-51 MG Take 1 tablet by mouth 2 (two) times daily. 180 tablet 3  . spironolactone (ALDACTONE) 25 MG tablet Take 0.5 tablets (12.5 mg total) by mouth daily. (Patient taking differently: Take 25 mg by mouth daily.) 30 tablet 0  . thiamine 100 MG tablet Take 1 tablet (100 mg total) by mouth daily. 30 tablet 0  . vitamin B-12 (CYANOCOBALAMIN) 100 MCG tablet Take 100 mcg by mouth daily.      No current facility-administered medications on file prior to visit.    There are no Patient Instructions on file for this visit. No follow-ups on file.   Georgiana Spinner, NP

## 2021-01-03 ENCOUNTER — Telehealth (INDEPENDENT_AMBULATORY_CARE_PROVIDER_SITE_OTHER): Payer: Self-pay

## 2021-01-03 NOTE — Telephone Encounter (Signed)
I attempted to contact the patient's aunt Mindi Junker and a message was left for a return call.

## 2021-01-07 NOTE — Progress Notes (Signed)
Patient ID: Shawn Taylor, male    DOB: [birth year], 65 y.o.   MRN: [id number]   Shawn Taylor is a 65 y/o male with a history of HTN, atrial flutter, DVT, thrombocytopenia, alcohol use,Current tobacco use and chronic heart failure.   Echo report from 02/14/20 reviewed and showed an EF of <20% along with moderate Shawn/TR.   Has not been admitted or been in the ED in the last 6 months.   He presents today for a follow-up visit with a chief complaint of continued bilateral leg pain with R worse then the L. He describes this as chronic in nature having been present for several years. He has associated gradual weight gain along with this. He denies any difficulty sleeping, abdominal distention, palpitations, pedal edema, chest pain, shortness of breath, cough, dizziness or fatigue.   He says that he eats 3 meals a day because he knows that he needs to eat when he takes his medicine along with snacking during the day. He admits to not being very active due to his leg pain. Has upcoming lower extremity angiography.   Says that he's been out of farxiga for ~ 2 weeks due to cost of medication.   Past Medical History:  Diagnosis Date  . Atrial flutter (HCC)   . CHF (congestive heart failure) (HCC)   . DVT (deep venous thrombosis) (HCC)   . Hypertension   . Migraines   . Thrombocytopenia (HCC)    No past surgical history on file. Family History  Problem Relation Age of Onset  . Diabetes Sister    Social History   Tobacco Use  . Smoking status: Former Smoker    Packs/day: 0.50    Types: Cigarettes    Quit date: 02/13/2020    Years since quitting: 0.9  . Smokeless tobacco: Never Used  Substance Use Topics  . Alcohol use: Not Currently    Alcohol/week: 2.0 - 3.0 standard drinks    Types: 2 - 3 Cans of beer per week   No Known Allergies  Prior to Admission medications   Medication Sig Start Date End Date Taking? Authorizing Provider  apixaban (ELIQUIS) 5 MG TABS tablet Take 1 tablet (5 mg  total) by mouth 2 (two) times daily. Patient taking differently: Take 5 mg by mouth every 12 (twelve) hours. 03/07/20  Yes Lurene Shadow, MD  aspirin 81 MG EC tablet Take 81 mg by mouth daily.   Yes [provider]  atorvastatin (LIPITOR) 40 MG tablet Take 1 tablet (40 mg total) by mouth daily at 10 pm. 03/07/20  Yes Lurene Shadow, MD  folic acid (FOLVITE) 1 MG tablet Take 1 mg by mouth daily with lunch. 07/13/20 07/13/21 Yes [provider]  furosemide (LASIX) 40 MG tablet Take 20 mg by mouth daily.   Yes [provider]  gabapentin (NEURONTIN) 300 MG capsule Take 300 mg by mouth at bedtime.   Yes [provider]  hydrALAZINE (APRESOLINE) 25 MG tablet Take 25 mg by mouth 3 (three) times daily.   Yes [provider]  isosorbide mononitrate (IMDUR) 30 MG 24 hr tablet Take 30 mg by mouth daily.   Yes [provider]  metoprolol succinate (TOPROL-XL) 25 MG 24 hr tablet Take 25 mg by mouth daily.   Yes Rockey Situ, NP  sacubitril-valsartan (ENTRESTO) 49-51 MG Take 1 tablet by mouth 2 (two) times daily. 09/04/20  Yes Clarisa Kindred A, FNP  spironolactone (ALDACTONE) 25 MG tablet Take 0.5 tablets (12.5 mg total)  by mouth daily. 03/08/20  Yes Lurene Shadow, MD  thiamine 100 MG tablet Take 1 tablet (100 mg total) by mouth daily. 03/08/20  Yes Lurene Shadow, MD  dapagliflozin propanediol (FARXIGA) 10 MG TABS tablet Take 10 mg by mouth daily. Patient not taking: Reported on 01/08/2021    [provider]    Review of Systems  Constitutional: Negative for appetite change and fatigue.  HENT: Positive for rhinorrhea. Negative for congestion and sore throat.   Eyes: Negative.   Respiratory: Negative for cough and shortness of breath.   Cardiovascular: Negative for chest pain, palpitations and leg swelling.  Gastrointestinal: Negative for abdominal distention and abdominal pain.  Endocrine: Negative.   Genitourinary: Negative.    Musculoskeletal: Positive for arthralgias (both legs and his hands). Negative for back pain and neck pain.  Skin: Negative.   Allergic/Immunologic: Negative.   Neurological: Negative for dizziness, tremors, syncope, weakness and light-headedness.  Hematological: Negative for adenopathy. Does not bruise/bleed easily.  Psychiatric/Behavioral: Negative for dysphoric mood and sleep disturbance (sleeping on 2 pillows). The patient is not nervous/anxious.    Vitals:   01/08/21 1004  BP: (!) 154/67  Pulse: 77  Resp: 18  SpO2: 100%  Weight: 210 lb (95.3 kg)  Height: 5\' 10"  (1.778 m)   Wt Readings from Last 3 Encounters:  01/08/21 210 lb (95.3 kg)  12/20/20 208 lb (94.3 kg)  12/03/20 205 lb (93 kg)   Lab Results  Component Value Date   CREATININE 1.01 05/29/2020   CREATININE 0.95 04/14/2020   CREATININE 1.18 03/03/2020    Physical Exam Vitals and nursing note reviewed.  Constitutional:      General: He is not in acute distress.    Appearance: Normal appearance. He is well-developed. He is not ill-appearing, toxic-appearing or diaphoretic.  HENT:     Head: Normocephalic and atraumatic.  Neck:     Vascular: No JVD.  Cardiovascular:     Rate and Rhythm: Normal rate and regular rhythm.  Pulmonary:     Effort: Pulmonary effort is normal. No respiratory distress.     Breath sounds: No wheezing, rhonchi or rales.  Abdominal:     Tenderness: There is no abdominal tenderness.  Musculoskeletal:     Cervical back: Normal range of motion and neck supple.     Right lower leg: No edema.     Left lower leg: No edema.  Neurological:     General: No focal deficit present.     Mental Status: He is alert and oriented to person, place, and time.  Psychiatric:        Mood and Affect: Mood normal.        Behavior: Behavior normal.    Assessment & Plan:  1: Chronic heart failure with reduced ejection fraction- - NYHA class I - euvolemic today - weighing daily; reminded to call for an  overnight weight gain of >2 pounds or a weekly weight gain of >5 pounds - weight up 12 pounds since last visit here 3 months ago - not adding salt but does rely on others bringing food or eating convenient food; admits to eating 3 full meals a day along with snacking during the day and not getting much exercise due to leg pain - encouraged to decrease calorie consumption especially until blood flow in legs improves - saw cardiology 05/03/2020) 12/12/20 & cardiac MRI ordered - 1 month samples of farxiga 10mg  given for him to take 1 tablet every morning; PA done through Cover My Meds;  patient assistance application also filled out for patient today - BNP 02/20/20 was 532.0 - reports receiving all 3 covid vaccines  2: HTN- - BP elevated today (154/67); normally BP is on the low side - follows with PCP at [facility]); has appt scheduled on 01/10/21 - BMP on 11/02/20 reviewed and showed sodium 136, potassium 5.0, creatinine 1.2 and GFR 74  3. Atherosclerosis of lower legs- - saw vascular Manson Passey) 12/20/20 - has angiography scheduled on 01/15/21 (right leg per patient report) and then 01/22/21 (left leg per patient report)   Medication bottles reviewed.   Return in 4 months or sooner for any questions/problems before then.

## 2021-01-07 NOTE — Telephone Encounter (Signed)
In order to get the patient scheduled in a timely manner with anesthesia the patient has been scheduled with Dr. Gilda Crease for a RLE angio on 01/15/21 with a 11:00 am arrival time to the [facility] and covid testing on 01/11/21 at the [facility]. LLE angio on 01/22/21 with a 11:00 am arrival time to the [facility] and covid testing on 01/18/21 at the [facility]. Pre-procedure instructions will be mailed to the patient. I have attempted to contact the patient's aunt and messages left for a return call.

## 2021-01-08 ENCOUNTER — Encounter: Payer: Self-pay | Admitting: Family

## 2021-01-08 ENCOUNTER — Ambulatory Visit: Payer: Medicaid Other | Attending: Family | Admitting: Family

## 2021-01-08 ENCOUNTER — Other Ambulatory Visit: Payer: Self-pay

## 2021-01-08 VITALS — BP 154/67 | HR 77 | Resp 18 | Ht 70.0 in | Wt 210.0 lb

## 2021-01-08 DIAGNOSIS — I70201 Unspecified atherosclerosis of native arteries of extremities, right leg: Secondary | ICD-10-CM | POA: Insufficient documentation

## 2021-01-08 DIAGNOSIS — Z7982 Long term (current) use of aspirin: Secondary | ICD-10-CM | POA: Insufficient documentation

## 2021-01-08 DIAGNOSIS — I10 Essential (primary) hypertension: Secondary | ICD-10-CM

## 2021-01-08 DIAGNOSIS — Z87891 Personal history of nicotine dependence: Secondary | ICD-10-CM | POA: Diagnosis not present

## 2021-01-08 DIAGNOSIS — I5022 Chronic systolic (congestive) heart failure: Secondary | ICD-10-CM | POA: Diagnosis present

## 2021-01-08 DIAGNOSIS — I70202 Unspecified atherosclerosis of native arteries of extremities, left leg: Secondary | ICD-10-CM | POA: Diagnosis not present

## 2021-01-08 DIAGNOSIS — Z7901 Long term (current) use of anticoagulants: Secondary | ICD-10-CM | POA: Diagnosis not present

## 2021-01-08 DIAGNOSIS — M79604 Pain in right leg: Secondary | ICD-10-CM

## 2021-01-08 DIAGNOSIS — I110 Hypertensive heart disease with heart failure: Secondary | ICD-10-CM | POA: Insufficient documentation

## 2021-01-08 DIAGNOSIS — Z86718 Personal history of other venous thrombosis and embolism: Secondary | ICD-10-CM | POA: Diagnosis not present

## 2021-01-08 DIAGNOSIS — M79605 Pain in left leg: Secondary | ICD-10-CM

## 2021-01-08 NOTE — Patient Instructions (Signed)
Continue weighing daily and call for an overnight weight gain of > 2 pounds or a weekly weight gain of >5 pounds. 

## 2021-01-11 ENCOUNTER — Other Ambulatory Visit: Payer: Medicaid Other | Attending: Vascular Surgery

## 2021-01-14 ENCOUNTER — Other Ambulatory Visit (INDEPENDENT_AMBULATORY_CARE_PROVIDER_SITE_OTHER): Payer: Self-pay | Admitting: Nurse Practitioner

## 2021-01-15 ENCOUNTER — Telehealth (INDEPENDENT_AMBULATORY_CARE_PROVIDER_SITE_OTHER): Payer: Self-pay

## 2021-01-15 DIAGNOSIS — I70229 Atherosclerosis of native arteries of extremities with rest pain, unspecified extremity: Secondary | ICD-10-CM

## 2021-01-15 MED ORDER — METHYLPREDNISOLONE SODIUM SUCC 125 MG IJ SOLR: 125.0000 mg | Freq: Once | INTRAMUSCULAR | Status: DC | PRN

## 2021-01-15 MED ORDER — CEFAZOLIN SODIUM-DEXTROSE 2-4 GM/100ML-% IV SOLN
2.0000 g | Freq: Once | INTRAVENOUS | Status: AC
  Administered 2021-01-29: 2 g via INTRAVENOUS

## 2021-01-15 MED ORDER — MIDAZOLAM HCL 2 MG/ML PO SYRP: 8.0000 mg | ORAL_SOLUTION | Freq: Once | ORAL | Status: DC | PRN

## 2021-01-15 MED ORDER — SODIUM CHLORIDE 0.9 % IV SOLN: INTRAVENOUS | Status: DC

## 2021-01-15 MED ORDER — DIPHENHYDRAMINE HCL 50 MG/ML IJ SOLN: 50.0000 mg | Freq: Once | INTRAMUSCULAR | Status: DC | PRN

## 2021-01-15 MED ORDER — FAMOTIDINE 20 MG PO TABS: 40.0000 mg | ORAL_TABLET | Freq: Once | ORAL | Status: DC | PRN

## 2021-01-15 NOTE — Telephone Encounter (Signed)
Spoke with Mindi Junker the patient's aunt to get the patient rescheduled for his  RLE angio. Patient has been rescheduled to 01/29/21 with Dr. Gilda Crease with a 11:00 am arrival time to the [facility] and covid testing on 01/25/21 at the [facility]. Pre-procedure instructions will be mailed.

## 2021-01-18 ENCOUNTER — Other Ambulatory Visit
Admission: RE | Admit: 2021-01-18 | Discharge: 2021-01-18 | Disposition: A | Discharge status: Home / Self Care | Payer: Medicaid Other | Source: Ambulatory Visit | Attending: Vascular Surgery | Admitting: Vascular Surgery

## 2021-01-18 ENCOUNTER — Other Ambulatory Visit: Payer: Self-pay

## 2021-01-18 DIAGNOSIS — Z20822 Contact with and (suspected) exposure to covid-19: Secondary | ICD-10-CM | POA: Diagnosis not present

## 2021-01-18 DIAGNOSIS — Z01812 Encounter for preprocedural laboratory examination: Secondary | ICD-10-CM | POA: Diagnosis not present

## 2021-01-18 LAB — SARS CORONAVIRUS 2 (TAT 6-24 HRS): SARS Coronavirus 2: NEGATIVE

## 2021-01-21 ENCOUNTER — Other Ambulatory Visit (INDEPENDENT_AMBULATORY_CARE_PROVIDER_SITE_OTHER): Payer: Self-pay | Admitting: Nurse Practitioner

## 2021-01-22 ENCOUNTER — Encounter: Payer: Self-pay | Admitting: Anesthesiology

## 2021-01-22 ENCOUNTER — Ambulatory Visit: Admission: RE | Admit: 2021-01-22 | Payer: Medicaid Other | Source: Home / Self Care | Admitting: Vascular Surgery

## 2021-01-22 ENCOUNTER — Encounter: Admission: RE | Payer: Self-pay | Source: Home / Self Care

## 2021-01-22 DIAGNOSIS — I70229 Atherosclerosis of native arteries of extremities with rest pain, unspecified extremity: Secondary | ICD-10-CM

## 2021-01-22 SURGERY — LOWER EXTREMITY ANGIOGRAPHY
Anesthesia: General | Site: Leg Lower | Laterality: Left

## 2021-01-25 ENCOUNTER — Other Ambulatory Visit
Admission: RE | Admit: 2021-01-25 | Discharge: 2021-01-25 | Disposition: A | Discharge status: Home / Self Care | Payer: Medicaid Other | Source: Ambulatory Visit | Attending: Vascular Surgery | Admitting: Vascular Surgery

## 2021-01-25 ENCOUNTER — Other Ambulatory Visit: Payer: Self-pay

## 2021-01-25 DIAGNOSIS — Z20822 Contact with and (suspected) exposure to covid-19: Secondary | ICD-10-CM | POA: Diagnosis not present

## 2021-01-25 DIAGNOSIS — Z01812 Encounter for preprocedural laboratory examination: Secondary | ICD-10-CM | POA: Insufficient documentation

## 2021-01-26 LAB — SARS CORONAVIRUS 2 (TAT 6-24 HRS): SARS Coronavirus 2: NEGATIVE

## 2021-01-28 ENCOUNTER — Other Ambulatory Visit (INDEPENDENT_AMBULATORY_CARE_PROVIDER_SITE_OTHER): Payer: Self-pay | Admitting: Nurse Practitioner

## 2021-01-29 ENCOUNTER — Encounter
Admission: RE | Disposition: A | Discharge status: Home / Self Care | Payer: Self-pay | Source: Home / Self Care | Attending: Vascular Surgery

## 2021-01-29 ENCOUNTER — Ambulatory Visit: Payer: Medicaid Other | Admitting: Certified Registered"

## 2021-01-29 ENCOUNTER — Other Ambulatory Visit: Payer: Self-pay

## 2021-01-29 ENCOUNTER — Ambulatory Visit
Admission: RE | Admit: 2021-01-29 | Discharge: 2021-01-29 | Disposition: A | Discharge status: Home / Self Care | Payer: Medicaid Other | Attending: Vascular Surgery | Admitting: Vascular Surgery

## 2021-01-29 DIAGNOSIS — Z87891 Personal history of nicotine dependence: Secondary | ICD-10-CM | POA: Insufficient documentation

## 2021-01-29 DIAGNOSIS — I110 Hypertensive heart disease with heart failure: Secondary | ICD-10-CM | POA: Diagnosis not present

## 2021-01-29 DIAGNOSIS — I70229 Atherosclerosis of native arteries of extremities with rest pain, unspecified extremity: Secondary | ICD-10-CM

## 2021-01-29 DIAGNOSIS — Z7901 Long term (current) use of anticoagulants: Secondary | ICD-10-CM | POA: Insufficient documentation

## 2021-01-29 DIAGNOSIS — Z79899 Other long term (current) drug therapy: Secondary | ICD-10-CM | POA: Insufficient documentation

## 2021-01-29 DIAGNOSIS — M79602 Pain in left arm: Secondary | ICD-10-CM | POA: Insufficient documentation

## 2021-01-29 DIAGNOSIS — I4892 Unspecified atrial flutter: Secondary | ICD-10-CM | POA: Diagnosis not present

## 2021-01-29 DIAGNOSIS — I2510 Atherosclerotic heart disease of native coronary artery without angina pectoris: Secondary | ICD-10-CM | POA: Insufficient documentation

## 2021-01-29 DIAGNOSIS — E782 Mixed hyperlipidemia: Secondary | ICD-10-CM | POA: Diagnosis not present

## 2021-01-29 DIAGNOSIS — I70221 Atherosclerosis of native arteries of extremities with rest pain, right leg: Secondary | ICD-10-CM | POA: Insufficient documentation

## 2021-01-29 DIAGNOSIS — I6523 Occlusion and stenosis of bilateral carotid arteries: Secondary | ICD-10-CM | POA: Insufficient documentation

## 2021-01-29 DIAGNOSIS — I509 Heart failure, unspecified: Secondary | ICD-10-CM | POA: Insufficient documentation

## 2021-01-29 DIAGNOSIS — Z7982 Long term (current) use of aspirin: Secondary | ICD-10-CM | POA: Diagnosis not present

## 2021-01-29 HISTORY — PX: LOWER EXTREMITY ANGIOGRAPHY: CATH118251

## 2021-01-29 LAB — CREATININE, SERUM
Creatinine, Ser: 1.41 mg/dL — ABNORMAL HIGH (ref 0.61–1.24)
GFR, Estimated: 56 mL/min — ABNORMAL LOW (ref >60)

## 2021-01-29 LAB — BUN: BUN: 31 mg/dL — ABNORMAL HIGH (ref 8–23)

## 2021-01-29 SURGERY — LOWER EXTREMITY ANGIOGRAPHY
Anesthesia: General | Site: Leg Lower | Laterality: Right

## 2021-01-29 MED ORDER — IODIXANOL 320 MG/ML IV SOLN
INTRAVENOUS | Status: DC | PRN
  Administered 2021-01-29: 80 mL

## 2021-01-29 MED ORDER — FENTANYL CITRATE (PF) 100 MCG/2ML IJ SOLN
INTRAMUSCULAR | Status: DC | PRN
  Administered 2021-01-29: 12.5 ug via INTRAVENOUS
  Administered 2021-01-29: 25 ug via INTRAVENOUS
  Administered 2021-01-29: 12.5 ug via INTRAVENOUS
  Administered 2021-01-29 (×2): 25 ug via INTRAVENOUS

## 2021-01-29 MED ORDER — LIDOCAINE HCL (CARDIAC) PF 100 MG/5ML IV SOSY
PREFILLED_SYRINGE | INTRAVENOUS | Status: DC | PRN
  Administered 2021-01-29: 100 mg via INTRAVENOUS

## 2021-01-29 MED ORDER — EPHEDRINE SULFATE 50 MG/ML IJ SOLN
INTRAMUSCULAR | Status: DC | PRN
  Administered 2021-01-29 (×3): 5 mg via INTRAVENOUS

## 2021-01-29 MED ORDER — PROPOFOL 10 MG/ML IV BOLUS
INTRAVENOUS | Status: DC | PRN
  Administered 2021-01-29: 120 mg via INTRAVENOUS

## 2021-01-29 MED ORDER — CEFAZOLIN SODIUM-DEXTROSE 2-4 GM/100ML-% IV SOLN: 2.0000 g | Freq: Once | INTRAVENOUS | Status: DC

## 2021-01-29 MED ORDER — OXYCODONE HCL 5 MG/5ML PO SOLN
5.0000 mg | Freq: Once | ORAL | Status: DC | PRN
  Filled 2021-01-29: qty 5

## 2021-01-29 MED ORDER — MORPHINE SULFATE (PF) 4 MG/ML IV SOLN: 2.0000 mg | INTRAVENOUS | Status: DC | PRN

## 2021-01-29 MED ORDER — ONDANSETRON HCL 4 MG/2ML IJ SOLN: 4.0000 mg | Freq: Once | INTRAMUSCULAR | Status: DC | PRN

## 2021-01-29 MED ORDER — METHYLPREDNISOLONE SODIUM SUCC 125 MG IJ SOLR: 125.0000 mg | Freq: Once | INTRAMUSCULAR | Status: DC | PRN

## 2021-01-29 MED ORDER — HYDROMORPHONE HCL 1 MG/ML IJ SOLN: 1.0000 mg | Freq: Once | INTRAMUSCULAR | Status: DC | PRN

## 2021-01-29 MED ORDER — DEXAMETHASONE SODIUM PHOSPHATE 10 MG/ML IJ SOLN
INTRAMUSCULAR | Status: DC | PRN
  Administered 2021-01-29: 10 mg via INTRAVENOUS

## 2021-01-29 MED ORDER — SODIUM CHLORIDE 0.9 % IV SOLN
INTRAVENOUS | Status: DC | PRN
  Administered 2021-01-29: 30 ug/min via INTRAVENOUS

## 2021-01-29 MED ORDER — FENTANYL CITRATE (PF) 100 MCG/2ML IJ SOLN: 25.0000 ug | INTRAMUSCULAR | Status: DC | PRN

## 2021-01-29 MED ORDER — PROPOFOL 10 MG/ML IV BOLUS
INTRAVENOUS | Status: AC
  Filled 2021-01-29: qty 20

## 2021-01-29 MED ORDER — MIDAZOLAM HCL 2 MG/ML PO SYRP: 8.0000 mg | ORAL_SOLUTION | Freq: Once | ORAL | Status: DC | PRN

## 2021-01-29 MED ORDER — LIDOCAINE HCL (PF) 2 % IJ SOLN
INTRAMUSCULAR | Status: AC
  Filled 2021-01-29: qty 5

## 2021-01-29 MED ORDER — MIDAZOLAM HCL 2 MG/2ML IJ SOLN
INTRAMUSCULAR | Status: DC | PRN
  Administered 2021-01-29: 2 mg via INTRAVENOUS

## 2021-01-29 MED ORDER — ONDANSETRON HCL 4 MG/2ML IJ SOLN: 4.0000 mg | Freq: Four times a day (QID) | INTRAMUSCULAR | Status: DC | PRN

## 2021-01-29 MED ORDER — DIPHENHYDRAMINE HCL 50 MG/ML IJ SOLN: 50.0000 mg | Freq: Once | INTRAMUSCULAR | Status: DC | PRN

## 2021-01-29 MED ORDER — PHENYLEPHRINE HCL (PRESSORS) 10 MG/ML IV SOLN
INTRAVENOUS | Status: AC
  Filled 2021-01-29: qty 1

## 2021-01-29 MED ORDER — DEXAMETHASONE SODIUM PHOSPHATE 10 MG/ML IJ SOLN
INTRAMUSCULAR | Status: AC
  Filled 2021-01-29: qty 1

## 2021-01-29 MED ORDER — ONDANSETRON HCL 4 MG/2ML IJ SOLN
INTRAMUSCULAR | Status: DC | PRN
  Administered 2021-01-29: 4 mg via INTRAVENOUS

## 2021-01-29 MED ORDER — FENTANYL CITRATE (PF) 100 MCG/2ML IJ SOLN
INTRAMUSCULAR | Status: AC
  Filled 2021-01-29: qty 2

## 2021-01-29 MED ORDER — ONDANSETRON HCL 4 MG/2ML IJ SOLN
INTRAMUSCULAR | Status: AC
  Filled 2021-01-29: qty 2

## 2021-01-29 MED ORDER — PHENYLEPHRINE HCL (PRESSORS) 10 MG/ML IV SOLN
INTRAVENOUS | Status: DC | PRN
  Administered 2021-01-29: 100 ug via INTRAVENOUS
  Administered 2021-01-29: 50 ug via INTRAVENOUS
  Administered 2021-01-29 (×2): 100 ug via INTRAVENOUS
  Administered 2021-01-29: 50 ug via INTRAVENOUS

## 2021-01-29 MED ORDER — SODIUM CHLORIDE 0.9 % IV SOLN: INTRAVENOUS | Status: DC

## 2021-01-29 MED ORDER — OXYCODONE HCL 5 MG PO TABS: 5.0000 mg | ORAL_TABLET | Freq: Once | ORAL | Status: DC | PRN

## 2021-01-29 MED ORDER — FAMOTIDINE 20 MG PO TABS: 40.0000 mg | ORAL_TABLET | Freq: Once | ORAL | Status: DC | PRN

## 2021-01-29 MED ORDER — MIDAZOLAM HCL 2 MG/2ML IJ SOLN
INTRAMUSCULAR | Status: AC
  Filled 2021-01-29: qty 2

## 2021-01-29 SURGICAL SUPPLY — 10 items
CANNULA 5F STIFF (CANNULA) ×2 IMPLANT
CATH ANGIO 5F PIGTAIL 65CM (CATHETERS) ×2 IMPLANT
DEVICE STARCLOSE SE CLOSURE (Vascular Products) ×2 IMPLANT
DEVICE TORQUE (MISCELLANEOUS) ×2 IMPLANT
GLIDEWIRE ADV .035X260CM (WIRE) ×2 IMPLANT
PACK ANGIOGRAPHY (CUSTOM PROCEDURE TRAY) ×2 IMPLANT
SHEATH BRITE TIP 5FRX11 (SHEATH) ×2 IMPLANT
SYR MEDRAD MARK 7 150ML (SYRINGE) ×2 IMPLANT
TUBING CONTRAST HIGH PRESS 72 (TUBING) ×2 IMPLANT
WIRE GUIDERIGHT .035X150 (WIRE) ×2 IMPLANT

## 2021-01-29 NOTE — Discharge Instructions (Signed)
Rutherford's Vascular and Endovascular Therapy (9th ed., pp. 4098-1191.Y7). [place], [place]: Elsevier."> Berry [employer] Kohn's Operating Room Technique [phone] ed., pp. [phone]). [place], [place]: Elsevier."> Vascular and Endovascular Surgery: A Companion to Specialist Surgical Practice (6th ed., pp. [phone]). [place], [place]: Elsevier.">  Femoral Endarterectomy  Femoral endarterectomy is surgery to clear a blocked femoral artery. The femoral artery is the main blood vessel that supplies blood to the legs. It is found in the groin area. The femoral artery can become blocked by a buildup of fat, cholesterol, calcium, and other substances (plaque). This blockage is also called peripheral vascular disease.Plaque may partially or completely block the artery or cause a blood clot to form, which can be dangerous. Femoral endarterectomy may be done as part of another blood vessel surgery. It can also be done in combination with a stent procedure. Tell a health care provider about:  Any allergies you have.  All medicines you are taking, including vitamins, herbs, eye drops, creams, and over-the-counter medicines.  Any problems you or family members have had with anesthetic medicines.  Any blood disorders you have.  Any surgeries you have had.  Any medical conditions you have, including diabetes or kidney problems.  Whether you are pregnant or may be pregnant. What are the risks? Generally, this is a safe procedure. However, problems may occur, including:  Infection.  Bleeding.  Allergic reactions to medicines or dyes.  Nerve damage.  Blood clots. These can form in the legs. Clots that break loose could travel to the heart, lungs, or brain.  Return of plaque buildup. This may cause another blockage. This can happen months or years after the procedure. What happens before the procedure? Staying hydrated Follow instructions from your health care provider about hydration, which may include:  Up to 2  hours before the procedure - you may continue to drink clear liquids, such as water, clear fruit juice, black coffee, and plain tea.   Eating and drinking restrictions Follow instructions from your health care provider about eating and drinking, which may include:  8 hours before the procedure - stop eating heavy meals or foods, such as meat, fried foods, or fatty foods.  6 hours before the procedure - stop eating light meals or foods, such as toast or cereal.  6 hours before the procedure - stop drinking milk or drinks that contain milk.  2 hours before the procedure - stop drinking clear liquids. Medicines Ask your health care provider about:  Starting new medicines or changing or stopping your regular medicines. This is especially important if you are taking diabetes medicines or blood thinners.  Taking medicines such as aspirin or ibuprofen. These medicines can thin your blood. Do not take these medicines unless your health care provider tells you to take them.  Taking over-the-counter medicines, vitamins, herbs, and supplements. Surgery safety Ask your health care provider:  How your surgery site will be marked.  What steps will be taken to help prevent infection. These steps may include: ? Removing hair at the surgery site. ? Washing skin with a germ-killing soap. ? Receiving antibiotic medicine. General instructions  Do not use any products that contain nicotine or tobacco for at least 4 weeks before the procedure. These products include cigarettes, chewing tobacco, and vaping devices, such as e-cigarettes. If you need help quitting, ask your health care provider.  Plan to have a responsible adult take you home from the hospital or clinic.  Plan to have a responsible adult care for you for the  time you are told after you leave the hospital or clinic. This is important.  You may have tests, including: ? Blood tests. ? A Doppler ultrasonogram. This test uses sound waves to  check blood flow through your femoral artery and leg. ? An angiogram. This involves having dye put into your blood and then having X-rays taken. What happens during the procedure?  Small monitors will be placed on your body. These will be used to check your heart rate, breathing rate, blood pressure, and oxygen level.  An IV will be inserted into one of your veins.  You will be given one or more of the following: ? A medicine to help you relax (sedative). ? A medicine to numb the area (local anesthetic). ? A medicine to make you fall asleep (general anesthetic). ? A medicine that is injected into your spine to numb the area below and slightly above the injection site (spinal anesthetic). ? A medicine that is injected into an area of your body to numb everything below the injection site (regional anesthetic).  An incision will be made in your groin area, over the blocked part of the femoral artery.  A clamp will be placed on the femoral artery above and below the blockage. This will stop blood from flowing through the blocked area.  An incision will be made in the femoral artery so the health care provider can access the plaque.  The plaque will be stripped away from the inner walls of the artery.  The femoral artery will be closed using a patch made of prosthetic material or from one of your own veins. This will make the artery wider. It also helps keep the artery from getting narrow again.  The clamp will be removed so that blood can flow through the femoral artery again.  The incision in your groin will be closed with staples or stitches (sutures).  A bandage (dressing) will be placed over the incision. The procedure may vary among health care providers and hospitals. What happens after the procedure?  Your blood pressure, heart rate, breathing rate, and blood oxygen level will be monitored until you leave the hospital or clinic.  You may have to wear compression stockings. These  stockings help to prevent blood clots and reduce swelling in your legs.  You will be given pain medicine as needed.  If you were given a sedative during the procedure, it can affect you for several hours. Do not drive or operate machinery until your health care provider says that it is safe. Summary  Femoral endarterectomy is surgery to clear a blocked femoral artery. The femoral artery is the main blood vessel that supplies blood to the legs. It is found in the groin area.  Before surgery, ask your health care provider before starting new medicines or changing or stopping your regular medicines.  If you were given a sedative during the procedure, you should not drive or operate machinery until your health care provider says that it is safe. This information is not intended to replace advice given to you by your health care provider. Make sure you discuss any questions you have with your health care provider. Document Revised: 05/14/2020 Document Reviewed: 05/14/2020 Elsevier Patient Education  2021 [employer] Femoral Site Care  This sheet gives you information about how to care for yourself after your procedure. Your health care provider may also give you more specific instructions. If you have problems or questions, contact your health care provider. What can I expect  after the procedure? After the procedure, it is common to have:  Bruising that usually fades within 1-2 weeks.  Tenderness at the site. Follow these instructions at home: Wound care  Follow instructions from your health care provider about how to take care of your insertion site. Make sure you: ? Wash your hands with soap and water before you change your bandage (dressing). If soap and water are not available, use hand sanitizer. ? Change your dressing as told by your health care provider. ? Leave stitches (sutures), skin glue, or adhesive strips in place. These skin closures may need to stay in place for 2 weeks or  longer. If adhesive strip edges start to loosen and curl up, you may trim the loose edges. Do not remove adhesive strips completely unless your health care provider tells you to do that.  Do not take baths, swim, or use a hot tub until your health care provider approves.  You may shower 24-48 hours after the procedure or as told by your health care provider. ? Gently wash the site with plain soap and water. ? Pat the area dry with a clean towel. ? Do not rub the site. This may cause bleeding.  Do not apply powder or lotion to the site. Keep the site clean and dry.  Check your femoral site every day for signs of infection. Check for: ? Redness, swelling, or pain. ? Fluid or blood. ? Warmth. ? Pus or a bad smell. Activity  For the first 2-3 days after your procedure, or as long as directed: ? Avoid climbing stairs as much as possible. ? Do not squat.  Do not lift anything that is heavier than 10 lb (4.5 kg), or the limit that you are told, until your health care provider says that it is safe.  Rest as directed. ? Avoid sitting for a long time without moving. Get up to take short walks every 1-2 hours.  Do not drive for 24 hours if you were given a medicine to help you relax (sedative). General instructions  Take over-the-counter and prescription medicines only as told by your health care provider.  Keep all follow-up visits as told by your health care provider. This is important. Contact a health care provider if you have:  A fever or chills.  You have redness, swelling, or pain around your insertion site. Get help right away if:  The catheter insertion area swells very fast.  You pass out.  You suddenly start to sweat or your skin gets clammy.  The catheter insertion area is bleeding, and the bleeding does not stop when you hold steady pressure on the area.  The area near or just beyond the catheter insertion site becomes pale, cool, tingly, or numb. These symptoms may  represent a serious problem that is an emergency. Do not wait to see if the symptoms will go away. Get medical help right away. Call your local emergency services (911 in the [place] Do not drive yourself to the hospital. Summary  After the procedure, it is common to have bruising that usually fades within 1-2 weeks.  Check your femoral site every day for signs of infection.  Do not lift anything that is heavier than 10 lb (4.5 kg), or the limit that you are told, until your health care provider says that it is safe. This information is not intended to replace advice given to you by your health care provider. Make sure you discuss any questions you have with your health  care provider. Document Revised: 07/13/2020 Document Reviewed: 07/13/2020 Elsevier Patient Education  2021 [employer].

## 2021-01-29 NOTE — H&P (Signed)
@LOGO @   MRN :  Shawn Taylor is a 65 y.o. (10-09-1956) male who presents with chief complaint of No chief complaint on file. 09/17/1956  History of Present Illness:   Patient presents to [facility] [place] for angiography with hope for intervention secondary to increasing leg pain.  Patient notes the pain is always associated with activity and is very consistent day today. Typically, the pain occurs at less than one block, progress is as activity continues to the point that the patient must stop walking.  Right leg is worse than the left.   Resting including standing still for several minutes allowed resumption of the activity and the ability to walk a similar distance before stopping again. Uneven terrain and inclined shorten the distance. The pain has been progressive over the past several years. The patient states the inability to walk is now having a profound negative impact on quality of life and daily activities.  The patient denies rest pain or dangling of an extremity off the side of the bed during the night for relief. No open wounds or sores at this time. No prior interventions or surgeries.  He is also c/o numbness and pins/needle like sensation of the left hand.  No history of back problems or DJD of the lumbar sacral spine.   The patient denies changes in claudication symptoms or new rest pain symptoms.  No new ulcers or wounds of the foot.  The patient's blood pressure has been stable and relatively well controlled. The patient denies amaurosis fugax or recent TIA symptoms. There are no recent neurological changes noted. The patient denies history of DVT, PE or superficial thrombophlebitis. The patient denies recent episodes of angina or shortness of breath.  Current Meds  Medication Sig  . apixaban (ELIQUIS) 5 MG TABS tablet Take 1 tablet (5 mg total) by mouth 2 (two) times daily. (Patient taking differently: Take 5 mg by mouth every 12 (twelve)  hours.)  . aspirin 81 MG EC tablet Take 81 mg by mouth daily.  [facility] atorvastatin (LIPITOR) 40 MG tablet Take 1 tablet (40 mg total) by mouth daily at 10 pm.  . folic acid (FOLVITE) 1 MG tablet Take 1 mg by mouth daily with lunch.  . furosemide (LASIX) 40 MG tablet Take 20 mg by mouth daily.  Marland Kitchen gabapentin (NEURONTIN) 300 MG capsule Take 300 mg by mouth at bedtime.  . hydrALAZINE (APRESOLINE) 25 MG tablet Take 25 mg by mouth 3 (three) times daily.  . isosorbide mononitrate (IMDUR) 30 MG 24 hr tablet Take 30 mg by mouth daily.  . metoprolol succinate (TOPROL-XL) 25 MG 24 hr tablet Take 25 mg by mouth daily.  . sacubitril-valsartan (ENTRESTO) 49-51 MG Take 1 tablet by mouth 2 (two) times daily.  Marland Kitchen spironolactone (ALDACTONE) 25 MG tablet Take 0.5 tablets (12.5 mg total) by mouth daily.  Marland Kitchen thiamine 100 MG tablet Take 1 tablet (100 mg total) by mouth daily.    Past Medical History:  Diagnosis Date  . Atrial flutter (HCC)   . CHF (congestive heart failure) (HCC)   . DVT (deep venous thrombosis) (HCC)   . Hypertension   . Migraines   . Thrombocytopenia (HCC)     No past surgical history on file.  Social History Social History   Tobacco Use  . Smoking status: Former Smoker    Packs/day: 0.50    Types: Cigarettes    Quit date: 02/13/2020    Years since quitting: 0.9  . Smokeless tobacco: Never  Used  Substance Use Topics  . Alcohol use: Not Currently    Alcohol/week: 2.0 - 3.0 standard drinks    Types: 2 - 3 Cans of beer per week  . Drug use: Not Currently    Frequency: 1.0 times per week    Types: Cocaine    Family History Family History  Problem Relation Age of Onset  . Diabetes Sister     No Known Allergies   REVIEW OF SYSTEMS (Negative unless checked)  Constitutional: [] Weight loss  [] Fever  [] Chills Cardiac: [] Chest pain   [] Chest pressure   [] Palpitations   [] Shortness of breath when laying flat   [] Shortness of breath with exertion. Vascular:  [x] Pain in legs with  walking   [x] Pain in legs at rest  [] History of DVT   [] Phlebitis   [] Swelling in legs   [] Varicose veins   [] Non-healing ulcers Pulmonary:   [] Uses home oxygen   [] Productive cough   [] Hemoptysis   [] Wheeze  [] COPD   [] Asthma Neurologic:  [] Dizziness   [] Seizures   [] History of stroke   [] History of TIA  [] Aphasia   [] Vissual changes   [] Weakness or numbness in arm   [] Weakness or numbness in leg Musculoskeletal:   [] Joint swelling   [x] Joint pain   [] Low back pain Hematologic:  [] Easy bruising  [] Easy bleeding   [] Hypercoagulable state   [] Anemic Gastrointestinal:  [] Diarrhea   [] Vomiting  [] Gastroesophageal reflux/heartburn   [] Difficulty swallowing. Genitourinary:  [] Chronic kidney disease   [] Difficult urination  [] Frequent urination   [] Blood in urine Skin:  [] Rashes   [] Ulcers  Psychological:  [] History of anxiety   []  History of major depression.  Physical Examination  There were no vitals filed for this visit. There is no height or weight on file to calculate BMI. Gen: WD/WN, NAD Head: [place]/AT, No temporalis wasting.  Ear/Nose/Throat: Hearing grossly intact, nares w/o erythema or drainage Eyes: PER, EOMI, sclera nonicteric.  Neck: Supple, no large masses.   Pulmonary:  Good air movement, no audible wheezing bilaterally, no use of accessory muscles.  Cardiac: RRR, no JVD Vascular:  Vessel Right Left  Radial Palpable Palpable  PT Not Palpable Not Palpable  DP Not Palpable Not Palpable  Gastrointestinal: Non-distended. No guarding/no peritoneal signs.  Musculoskeletal: M/S 5/5 throughout.  No deformity or atrophy.  Neurologic: CN 2-12 intact. Symmetrical.  Speech is fluent. Motor exam as listed above. Psychiatric: Judgment intact, Mood & affect appropriate for pt's clinical situation. Dermatologic: No rashes or ulcers noted.  No changes consistent with cellulitis. Lymph : No lichenification or skin changes of chronic lymphedema.  CBC Lab Results  Component Value Date   WBC  12.1 (H) 04/14/2020   HGB 11.8 (L) 04/14/2020   HCT 35.3 (L) 04/14/2020   MCV 85.1 04/14/2020   PLT 337 04/14/2020    BMET    Component Value Date/Time   NA 138 05/29/2020 1254   K 4.3 05/29/2020 1254   CL 109 05/29/2020 1254   CO2 22 05/29/2020 1254   GLUCOSE 96 05/29/2020 1254   BUN 31 (H) 01/29/2021 1107   CREATININE 1.41 (H) 01/29/2021 1107   CALCIUM 9.4 05/29/2020 1254   GFRNONAA 56 (L) 01/29/2021 1107   GFRAA >60 05/29/2020 1254   CrCl cannot be calculated (Unknown ideal weight.).  COAG Lab Results  Component Value Date   INR 2.3 (H) 02/14/2020    Radiology No results found.   Assessment/Plan 1. Atherosclerosis of native artery of both lower extremities with intermittent claudication (HCC)  Recommend:  Patient should undergo arterial duplex of the lower extremity because there has been a significant progression in the patient's lower extremity symptoms.  The patient states they are having increased pain and a marked decrease in the distance that they can walk.  The risks and benefits as well as the alternatives were discussed in detail with the patient.  All questions were answered.  Patient agrees to proceed and understands this could be a prelude to angiography and intervention.  The patient will follow up with me in the office to review the studies.   - VAS [place] LOWER EXTREMITY ARTERIAL DUPLEX; Future  2. Bilateral carotid artery stenosis Recommend:  Given the patient's asymptomatic subcritical stenosis no further invasive testing or surgery at this time.  Duplex ultrasound is due.  Continue antiplatelet therapy as prescribed Continue management of CAD, HTN and Hyperlipidemia Healthy heart diet,  encouraged exercise at least 4 times per week Follow up in 1 month with duplex ultrasound and physical exam  - VAS US CAROTID; Future  3. Radicular pain in left arm I will ask Dr Myer Haff to evaluate him - Ambulatory referral to Neurosurgery  4.  Benign essential HTN Continue antihypertensive medications as already ordered, these medications have been reviewed and there are no changes at this time.   5. Atrial flutter, unspecified type (HCC) Continue antiarrhythmia medications as already ordered, these medications have been reviewed and there are no changes at this time.  Continue anticoagulation as ordered by Cardiology Service   6. Mixed hyperlipidemia Continue statin as ordered and reviewed, no changes at this time   Levora Dredge, MD  01/29/2021 12:32 PM

## 2021-01-29 NOTE — Transfer of Care (Signed)
Immediate Anesthesia Transfer of Care Note  Patient: Shawn Taylor  Procedure(s) Performed: LOWER EXTREMITY ANGIOGRAPHY (Right Leg Lower)  Patient Location: PACU  Anesthesia Type:General  Level of Consciousness: awake, alert  and oriented  Airway & Oxygen Therapy: Patient Spontanous Breathing and Patient connected to face mask oxygen  Post-op Assessment: Report given to RN and Post -op Vital signs reviewed and stable  Post vital signs: Reviewed and stable  Last Vitals:  Vitals Value Taken Time  BP 145/86 01/29/21 1330  Temp    Pulse 78 01/29/21 1332  Resp 19 01/29/21 1332  SpO2 100 % 01/29/21 1332  Vitals shown include unvalidated device data.  Last Pain: There were no vitals filed for this visit.       Complications: No complications documented.

## 2021-01-29 NOTE — Anesthesia Preprocedure Evaluation (Signed)
Anesthesia Evaluation  Patient identified by MRN, date of birth, ID band Patient awake    Reviewed: Allergy & Precautions, NPO status , Patient's Chart, lab work & pertinent test results  History of Anesthesia Complications Negative for: history of anesthetic complications  Airway Mallampati: III       Dental  (+) Chipped, Poor Dentition   Pulmonary neg sleep apnea, neg COPD, Not current smoker, former smoker,           Cardiovascular hypertension, Pt. on medications + Past MI and + Peripheral Vascular Disease       Neuro/Psych neg Seizures    GI/Hepatic Neg liver ROS, neg GERD  ,(+)     substance abuse  alcohol use,   Endo/Other  neg diabetes  Renal/GU negative Renal ROS     Musculoskeletal   Abdominal   Peds  Hematology   Anesthesia Other Findings   Reproductive/Obstetrics                             Anesthesia Physical Anesthesia Plan  ASA: III  Anesthesia Plan: General   Post-op Pain Management:    Induction: Intravenous  PONV Risk Score and Plan: 2  Airway Management Planned: LMA  Additional Equipment:   Intra-op Plan:   Post-operative Plan:   Informed Consent: I have reviewed the patients History and Physical, chart, labs and discussed the procedure including the risks, benefits and alternatives for the proposed anesthesia with the patient or authorized representative who has indicated his/her understanding and acceptance.       Plan Discussed with:   Anesthesia Plan Comments:         Anesthesia Quick Evaluation

## 2021-01-29 NOTE — Op Note (Signed)
[facility]  Percutaneous Study/Intervention Procedural Note   Date of Surgery: 01/29/2021,1:37 PM  Surgeon:Schnier, Latina Craver   Pre-operative Diagnosis: Atherosclerotic occlusive disease bilateral lower extremities with lifestyle limiting claudication as well as rest pain of the right foot  Post-operative diagnosis:  Same  Procedure(s) Performed:  1.  Abdominal aortogram  2.  Bilateral lower extremity distal runoff  3.  Third order catheter placement right lower extremity  4.  Star close left common femoral   Anesthesia: General by anesthesia  Sheath: 5 French 11 cm Pinnacle sheath left common femoral retrograde  Contrast: 80 cc   Fluoroscopy Time: 5.2 minutes  Indications:  The patient presents to [facility] with rest pain of the right foot.  Pedal pulses are nonpalpable bilaterally suggesting atherosclerotic occlusive disease.  The risks and benefits as well as alternative therapies for lower extremity revascularization are reviewed with the patient all questions are answered the patient agrees to proceed.  The patient is therefore undergoing angiography with the hope for intervention for limb salvage.   Procedure:  Shawn Taylor a 65 y.o. male who was identified and appropriate procedural time out was performed.  The patient was then placed supine on the table and prepped and draped in the usual sterile fashion.  Ultrasound was used to evaluate the left common femoral artery.  It was echolucent and pulsatile indicating it is patent .  An ultrasound image was acquired for the permanent record.  A micropuncture needle was used to access the left common femoral artery under direct ultrasound guidance.  The microwire was then advanced under fluoroscopic guidance without difficulty followed by the micro-sheath.  A 0.035 J wire was advanced without resistance and a 5Fr sheath was placed.    Pigtail catheter was then advanced to the level of T12 and AP  projection of the aorta was obtained. Pigtail catheter was then repositioned to above the bifurcation and LAO view of the pelvis was obtained. Stiff angled Glidewire and pigtail catheter was then used across the bifurcation and the catheter was positioned in the distal external iliac artery.  RAO as well as AP and LAO views of the right groin was then obtained. Wire was reintroduced and negotiated into the SFA and the catheter was advanced into the SFA. Distal runoff was then performed.  After adequate images were obtained of the right lower extremity distal imaging of the left lower extremity was performed through hand-injection utilizing the left common femoral sheath  After review of the images the catheter was removed over wire and an LAO view of the groin was obtained. StarClose device was deployed without difficulty.   Findings:   Aortogram: The abdominal aorta is opacified with a bolus injection of contrast.  There are mild atherosclerotic changes but there are no hemodynamically significant stenoses.  Renal arteries are poorly visualized.  The aortic bifurcation is widely patent.  Bilateral common and external iliac arteries demonstrate mild calcific changes but there are no hemodynamically significant stenoses.  Bilateral internal iliac arteries are patent.  Right Lower Extremity: The right common femoral demonstrates 2 hemodynamically significant lesions.  Just below the origins of the circumflex vessels within the proximal right common femoral there is extensive coral reef-like plaque formation with greater than 80% stenosis.  Midportion of the common femoral appears patent but the distal common femoral extending into the origins of both the profunda femoris and the superficial femoral there are again 80% stenoses at the origins of both of these branches.  Distal to this the profunda femoris is patent.  The right SFA demonstrates diffuse disease in its proximal and mid portions it then occludes  just proximal to Hunter's canal there is reconstitution of the above-knee popliteal but then a greater than 70% stenosis in the midportion the distal one third of the popliteal appears patent and without hemodynamically significant stenosis.  The trifurcation is diffusely diseased with occlusion of the anterior tibial throughout its course.  The posterior tibial appears patent and is the dominant runoff to the foot and without hemodynamically significant stenosis.  The peroneal is patent but it is heavily diseased.  Left Lower Extremity: The left common femoral demonstrates mild to moderate plaque no greater than 30% stenosis.  There again is some bulky plaque at the origins of the SFA and the profunda but these do not appear to be greater than 50%.  The profunda femoris is otherwise patent.  The left superficial femoral artery demonstrates a greater than 80% narrowing at Hunter's canal.  The above-knee popliteal fills and is widely patent.  The anterior tibial is patent in its proximal two thirds.  Near the ankle it occludes.  The occlusion extends into the dorsalis pedis.  The tibioperoneal trunk and posterior tibial are widely patent and the dominant vessels down to the foot although the posterior tibial somewhat small compared to the anterior tibial it is intact all the way to the foot filling the plantar vessels.  The peroneal is occluded throughout the majority of its length at the level of the ankle there is a short segment reconstitution.   Disposition: Patient was taken to the recovery room in stable condition having tolerated the procedure well.  Earl Lites Schnier 01/29/2021,1:37 PM

## 2021-01-29 NOTE — Anesthesia Procedure Notes (Signed)
Procedure Name: LMA Insertion Date/Time: 01/29/2021 12:21 PM Performed by: Joanette Gula, Tyron Manetta, CRNA Pre-anesthesia Checklist: Patient identified, Emergency Drugs available, Suction available and Patient being monitored Patient Re-evaluated:Patient Re-evaluated prior to induction Oxygen Delivery Method: Circle system utilized Preoxygenation: Pre-oxygenation with 100% oxygen Induction Type: IV induction Ventilation: Mask ventilation without difficulty LMA: LMA inserted LMA Size: 4.5 Number of attempts: 1 Placement Confirmation: positive ETCO2 and breath sounds checked- equal and bilateral Tube secured with: Tape Dental Injury: Teeth and Oropharynx as per pre-operative assessment

## 2021-01-29 NOTE — Interval H&P Note (Signed)
History and Physical Interval Note:  01/29/2021 12:37 PM  Shawn Taylor  has presented today for surgery, with the diagnosis of RT Lower Extremity Angiography   ASO with rest pain   BARD Rep cc: M Godley, S Willey   ANESTHESIA   Pt to have Covid test on 2-18.  The various methods of treatment have been discussed with the patient and family. After consideration of risks, benefits and other options for treatment, the patient has consented to  Procedure(s): LOWER EXTREMITY ANGIOGRAPHY (Right) as a surgical intervention.  The patient's history has been reviewed, patient examined, no change in status, stable for surgery.  I have reviewed the patient's chart and labs.  Questions were answered to the patient's satisfaction.     Levora Dredge

## 2021-01-30 ENCOUNTER — Encounter: Payer: Self-pay | Admitting: Vascular Surgery

## 2021-01-30 NOTE — Anesthesia Postprocedure Evaluation (Signed)
Anesthesia Post Note  Patient: Shawn Taylor  Procedure(s) Performed: LOWER EXTREMITY ANGIOGRAPHY (Right Leg Lower)  Patient location during evaluation: PACU Anesthesia Type: General Level of consciousness: awake and alert Pain management: pain level controlled Vital Signs Assessment: post-procedure vital signs reviewed and stable Respiratory status: spontaneous breathing, nonlabored ventilation and respiratory function stable Cardiovascular status: blood pressure returned to baseline and stable Postop Assessment: no apparent nausea or vomiting Anesthetic complications: no   No complications documented.   Last Vitals:  Vitals:   01/29/21 1445 01/29/21 1500  BP: 123/75 124/69  Pulse: 68   Resp: 14 15  Temp:    SpO2: 94% 95%    Last Pain:  Vitals:   01/29/21 1500  PainSc: 0-No pain                 Aurelio Brash Eulla Kochanowski

## 2021-02-05 ENCOUNTER — Other Ambulatory Visit (HOSPITAL_COMMUNITY): Payer: Self-pay | Admitting: Neurosurgery

## 2021-02-05 ENCOUNTER — Other Ambulatory Visit: Payer: Self-pay | Admitting: Neurosurgery

## 2021-02-05 DIAGNOSIS — G959 Disease of spinal cord, unspecified: Secondary | ICD-10-CM

## 2021-02-15 DIAGNOSIS — I70229 Atherosclerosis of native arteries of extremities with rest pain, unspecified extremity: Secondary | ICD-10-CM | POA: Insufficient documentation

## 2021-02-15 NOTE — Progress Notes (Signed)
MRN : [id number]  Shawn Taylor is a 65 y.o. (1956/03/03) male who presents with chief complaint of No chief complaint on file. Marland Kitchen  History of Present Illness:   The patient returns to the office for followup and review status post angiogram without intervention on 3/8.2022.   At angiography profound common femoral disease was found in association with occlusion of the SFA/Pop at [facility] canal.  The patient notes improvement in the lower extremity symptoms. No interval shortening of the patient's claudication distance or rest pain symptoms. Previous wounds have now healed.  No new ulcers or wounds have occurred since the last visit.  There have been no significant changes to the patient's overall health care.  The patient denies amaurosis fugax or recent TIA symptoms. There are no recent neurological changes noted. The patient denies history of DVT, PE or superficial thrombophlebitis.  The patient denies recent episodes of angina or shortness of breath.     No outpatient medications have been marked as taking for the 02/18/21 encounter (Appointment) with Gilda Crease, Latina Craver, MD.    Past Medical History:  Diagnosis Date  . Atrial flutter (HCC)   . CHF (congestive heart failure) (HCC)   . DVT (deep venous thrombosis) (HCC)   . Hypertension   . Migraines   . Thrombocytopenia (HCC)     Past Surgical History:  Procedure Laterality Date  . LOWER EXTREMITY ANGIOGRAPHY Right 01/29/2021   Procedure: LOWER EXTREMITY ANGIOGRAPHY;  Surgeon: Renford Dills, MD;  Location: [facility] INVASIVE CV LAB;  Service: Cardiovascular;  Laterality: Right;    Social History Social History   Tobacco Use  . Smoking status: Former Smoker    Packs/day: 0.50    Types: Cigarettes    Quit date: 02/13/2020    Years since quitting: 1.0  . Smokeless tobacco: Never Used  Substance Use Topics  . Alcohol use: Not Currently    Alcohol/week: 2.0 - 3.0 standard drinks    Types: 2 - 3 Cans of beer per week   . Drug use: Not Currently    Frequency: 1.0 times per week    Types: Cocaine    Family History Family History  Problem Relation Age of Onset  . Diabetes Sister     No Known Allergies   REVIEW OF SYSTEMS (Negative unless checked)  Constitutional: [] Weight loss  [] Fever  [] Chills Cardiac: [] Chest pain   [] Chest pressure   [] Palpitations   [] Shortness of breath when laying flat   [] Shortness of breath with exertion. Vascular:  [x] Pain in legs with walking   [x] Pain in legs at rest  [] History of DVT   [] Phlebitis   [] Swelling in legs   [] Varicose veins   [] Non-healing ulcers Pulmonary:   [] Uses home oxygen   [] Productive cough   [] Hemoptysis   [] Wheeze  [] COPD   [] Asthma Neurologic:  [] Dizziness   [] Seizures   [] History of stroke   [] History of TIA  [] Aphasia   [] Vissual changes   [] Weakness or numbness in arm   [] Weakness or numbness in leg Musculoskeletal:   [] Joint swelling   [] Joint pain   [] Low back pain Hematologic:  [] Easy bruising  [] Easy bleeding   [] Hypercoagulable state   [] Anemic Gastrointestinal:  [] Diarrhea   [] Vomiting  [] Gastroesophageal reflux/heartburn   [] Difficulty swallowing. Genitourinary:  [] Chronic kidney disease   [] Difficult urination  [] Frequent urination   [] Blood in urine Skin:  [] Rashes   [] Ulcers  Psychological:  [] History of anxiety   []  History of major depression.  Physical  Examination  There were no vitals filed for this visit. There is no height or weight on file to calculate BMI. Gen: WD/WN, NAD Head: [place]/AT, No temporalis wasting.  Ear/Nose/Throat: Hearing grossly intact, nares w/o erythema or drainage Eyes: PER, EOMI, sclera nonicteric.  Neck: Supple, no large masses.   Pulmonary:  Good air movement, no audible wheezing bilaterally, no use of accessory muscles.  Cardiac: RRR, no JVD Vascular:  Vessel Right Left  Radial Palpable Palpable  PT Not Palpable Palpable  DP Not Palpable Palpable  Gastrointestinal: Non-distended. No guarding/no  peritoneal signs.  Musculoskeletal: M/S 5/5 throughout.  No deformity or atrophy.  Neurologic: CN 2-12 intact. Symmetrical.  Speech is fluent. Motor exam as listed above. Psychiatric: Judgment intact, Mood & affect appropriate for pt's clinical situation. Dermatologic: No rashes or ulcers noted.  No changes consistent with cellulitis.   CBC Lab Results  Component Value Date   WBC 12.1 (H) 04/14/2020   HGB 11.8 (L) 04/14/2020   HCT 35.3 (L) 04/14/2020   MCV 85.1 04/14/2020   PLT 337 04/14/2020    BMET    Component Value Date/Time   NA 138 05/29/2020 1254   K 4.3 05/29/2020 1254   CL 109 05/29/2020 1254   CO2 22 05/29/2020 1254   GLUCOSE 96 05/29/2020 1254   BUN 31 (H) 01/29/2021 1107   CREATININE 1.41 (H) 01/29/2021 1107   CALCIUM 9.4 05/29/2020 1254   GFRNONAA 56 (L) 01/29/2021 1107   GFRAA >60 05/29/2020 1254   CrCl cannot be calculated (Unknown ideal weight.).  COAG Lab Results  Component Value Date   INR 2.3 (H) 02/14/2020    Radiology PERIPHERAL VASCULAR CATHETERIZATION  Result Date: 01/29/2021 See Op Note    Assessment/Plan 1. 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  This represents a limb threatening ischemia and places the patient at the risk for right limb loss.  Angiography has been performed and the situation is not ideal for intervention.  Given this finding open surgical repair is recommended.   Patient should undergo arterial reconstruction of the lower extremity with the hope for limb salvage.  The risks and benefits as well as the alternative therapies was discussed in detail with the patient.  All questions were answered.  Patient agrees to proceed with right femoral endarterectomy with SFA stent.  The patient will follow up with me in the office after the  procedure.   2. Bilateral carotid artery stenosis Recommend:  Given the patient's asymptomatic subcritical stenosis no further invasive testing or surgery at this time.  Continue antiplatelet therapy as prescribed Continue management of CAD, HTN and Hyperlipidemia Healthy heart diet, encouraged exercise at least 4 times per week Follow up in8month with duplex ultrasound and physical exam   3. Atrial flutter, unspecified type (HCC) Continue antiarrhythmia medications as already ordered, these medications have been reviewed and there are no changes at this time.  Continue anticoagulation as ordered by Cardiology Service   4. Mixed hyperlipidemia Continue statin as ordered and reviewed, no changes at this time     Levora Dredge, MD  02/15/2021 3:29 PM

## 2021-02-18 ENCOUNTER — Ambulatory Visit (INDEPENDENT_AMBULATORY_CARE_PROVIDER_SITE_OTHER): Payer: Medicaid Other | Admitting: Vascular Surgery

## 2021-02-18 ENCOUNTER — Other Ambulatory Visit: Payer: Self-pay

## 2021-02-18 ENCOUNTER — Encounter (INDEPENDENT_AMBULATORY_CARE_PROVIDER_SITE_OTHER): Payer: Self-pay | Admitting: Vascular Surgery

## 2021-02-18 VITALS — BP 143/73 | HR 85 | Resp 16 | Wt 214.0 lb

## 2021-02-18 DIAGNOSIS — E782 Mixed hyperlipidemia: Secondary | ICD-10-CM

## 2021-02-18 DIAGNOSIS — I70229 Atherosclerosis of native arteries of extremities with rest pain, unspecified extremity: Secondary | ICD-10-CM | POA: Diagnosis not present

## 2021-02-18 DIAGNOSIS — I6523 Occlusion and stenosis of bilateral carotid arteries: Secondary | ICD-10-CM | POA: Diagnosis not present

## 2021-02-18 DIAGNOSIS — I4892 Unspecified atrial flutter: Secondary | ICD-10-CM

## 2021-02-19 ENCOUNTER — Ambulatory Visit
Admission: RE | Admit: 2021-02-19 | Discharge: 2021-02-19 | Disposition: A | Discharge status: Home / Self Care | Payer: Medicaid Other | Source: Ambulatory Visit | Attending: Neurosurgery | Admitting: Neurosurgery

## 2021-02-19 DIAGNOSIS — G959 Disease of spinal cord, unspecified: Secondary | ICD-10-CM | POA: Insufficient documentation

## 2021-02-28 ENCOUNTER — Telehealth (INDEPENDENT_AMBULATORY_CARE_PROVIDER_SITE_OTHER): Payer: Self-pay

## 2021-02-28 NOTE — Telephone Encounter (Signed)
I attempted to contact the patient to schedule him for a right femoral endarterectomy and SFA stent placement and a message was left for a return call.

## 2021-03-01 NOTE — Telephone Encounter (Signed)
Patient's wife left a message on the nurse's line this morning and I returned the call. This time I was unable to leave a message as the voicemail box was full.

## 2021-03-05 ENCOUNTER — Ambulatory Visit: Payer: Medicaid Other | Admitting: Family

## 2021-03-11 ENCOUNTER — Ambulatory Visit: Payer: Medicaid Other | Admitting: Family

## 2021-03-13 ENCOUNTER — Ambulatory Visit: Payer: Medicaid Other | Admitting: Family

## 2021-03-15 ENCOUNTER — Ambulatory Visit: Payer: Medicaid Other | Attending: Family | Admitting: Family

## 2021-03-15 ENCOUNTER — Encounter: Payer: Self-pay | Admitting: Family

## 2021-03-15 ENCOUNTER — Other Ambulatory Visit: Payer: Self-pay

## 2021-03-15 VITALS — BP 129/72 | HR 95 | Resp 20 | Ht 71.0 in | Wt 211.5 lb

## 2021-03-15 DIAGNOSIS — Z7982 Long term (current) use of aspirin: Secondary | ICD-10-CM | POA: Insufficient documentation

## 2021-03-15 DIAGNOSIS — I10 Essential (primary) hypertension: Secondary | ICD-10-CM

## 2021-03-15 DIAGNOSIS — Z87891 Personal history of nicotine dependence: Secondary | ICD-10-CM | POA: Diagnosis not present

## 2021-03-15 DIAGNOSIS — I70209 Unspecified atherosclerosis of native arteries of extremities, unspecified extremity: Secondary | ICD-10-CM | POA: Diagnosis not present

## 2021-03-15 DIAGNOSIS — Z79899 Other long term (current) drug therapy: Secondary | ICD-10-CM | POA: Insufficient documentation

## 2021-03-15 DIAGNOSIS — I5022 Chronic systolic (congestive) heart failure: Secondary | ICD-10-CM

## 2021-03-15 DIAGNOSIS — Z7901 Long term (current) use of anticoagulants: Secondary | ICD-10-CM | POA: Diagnosis not present

## 2021-03-15 DIAGNOSIS — I70229 Atherosclerosis of native arteries of extremities with rest pain, unspecified extremity: Secondary | ICD-10-CM

## 2021-03-15 DIAGNOSIS — I4892 Unspecified atrial flutter: Secondary | ICD-10-CM | POA: Insufficient documentation

## 2021-03-15 DIAGNOSIS — Z86718 Personal history of other venous thrombosis and embolism: Secondary | ICD-10-CM | POA: Insufficient documentation

## 2021-03-15 DIAGNOSIS — I110 Hypertensive heart disease with heart failure: Secondary | ICD-10-CM | POA: Diagnosis not present

## 2021-03-15 NOTE — Progress Notes (Signed)
Patient ID: Shawn Taylor, male    DOB: Oct 08, 1956, 65 y.o.   MRN: [id number]   Shawn Taylor is a 65 y/o male with a history of HTN, atrial flutter, DVT, thrombocytopenia, alcohol use,Current tobacco use and chronic heart failure.   Echo report from 02/14/20 reviewed and showed an EF of <20% along with moderate Shawn/TR.   Has not been admitted or been in the ED in the last 6 months.   He presents today for a follow-up visit with a chief complaint of dry cough. He says that this has been present for several months although occurs on an intermittent basis. He has associated bilateral leg pain along with this. He denies any difficulty sleeping, dizziness, abdominal distention, palpitations, pedal edema, chest pain, shortness of breath, fatigue or weight gain.   His aunt fixes his pill box on a weekly basis for him. He says that he is going to be having some sort of neck surgery coming up and that has to be done first before vascular will "fix his legs".   Past Medical History:  Diagnosis Date  . Atrial flutter (HCC)   . CHF (congestive heart failure) (HCC)   . DVT (deep venous thrombosis) (HCC)   . Hypertension   . Migraines   . Thrombocytopenia (HCC)    Past Surgical History:  Procedure Laterality Date  . LOWER EXTREMITY ANGIOGRAPHY Right 01/29/2021   Procedure: LOWER EXTREMITY ANGIOGRAPHY;  Surgeon: Renford Dills, MD;  Location: [facility] INVASIVE CV LAB;  Service: Cardiovascular;  Laterality: Right;   Family History  Problem Relation Age of Onset  . Diabetes Sister    Social History   Tobacco Use  . Smoking status: Former Smoker    Packs/day: 0.50    Types: Cigarettes    Quit date: 02/13/2020    Years since quitting: 1.0  . Smokeless tobacco: Never Used  Substance Use Topics  . Alcohol use: Not Currently    Alcohol/week: 2.0 - 3.0 standard drinks    Types: 2 - 3 Cans of beer per week   No Known Allergies  Prior to Admission medications   Medication Sig Start Date End Date  Taking? Authorizing Provider  apixaban (ELIQUIS) 5 MG TABS tablet Take 1 tablet (5 mg total) by mouth 2 (two) times daily. Patient taking differently: Take 5 mg by mouth every 12 (twelve) hours. 03/07/20  Yes Lurene Shadow, MD  aspirin 81 MG EC tablet Take 81 mg by mouth daily.   Yes [provider]  atorvastatin (LIPITOR) 40 MG tablet Take 1 tablet (40 mg total) by mouth daily at 10 pm. 03/07/20  Yes Lurene Shadow, MD  dapagliflozin propanediol (FARXIGA) 10 MG TABS tablet Take 10 mg by mouth daily.   Yes [provider]  folic acid (FOLVITE) 1 MG tablet Take 1 mg by mouth daily with lunch. 07/13/20 07/13/21 Yes [provider]  furosemide (LASIX) 40 MG tablet Take 20 mg by mouth daily.   Yes [provider]  gabapentin (NEURONTIN) 300 MG capsule Take 300 mg by mouth at bedtime.   Yes [provider]  gabapentin (NEURONTIN) 300 MG capsule TAKE ONE CAPSULE BY MOUTH AT BEDTIME FOR PAIN 04/16/20 04/16/21 Yes Revelo, Presley Raddle, MD  hydrALAZINE (APRESOLINE) 25 MG tablet Take 25 mg by mouth 3 (three) times daily.   Yes [provider]  isosorbide mononitrate (IMDUR) 30 MG 24 hr tablet Take 30 mg by mouth daily.   Yes [provider]  metoprolol succinate (TOPROL-XL)  25 MG 24 hr tablet Take 25 mg by mouth daily.   Yes Rockey Situ, NP  sacubitril-valsartan (ENTRESTO) 49-51 MG Take 1 tablet by mouth 2 (two) times daily. 09/04/20  Yes Aren Pryde, Inetta Fermo A, FNP  spironolactone (ALDACTONE) 25 MG tablet Take 0.5 tablets (12.5 mg total) by mouth daily. Patient taking differently: Take 25 mg by mouth daily. 03/08/20  Yes Lurene Shadow, MD  thiamine 100 MG tablet Take 1 tablet (100 mg total) by mouth daily. 03/08/20  Yes Lurene Shadow, MD    Review of Systems  Constitutional: Negative for appetite change and fatigue.  HENT: Positive for rhinorrhea. Negative for congestion and sore throat.   Eyes: Negative.   Respiratory: Positive for cough (dry  cough). Negative for shortness of breath.   Cardiovascular: Negative for chest pain, palpitations and leg swelling.  Gastrointestinal: Negative for abdominal distention and abdominal pain.  Endocrine: Negative.   Genitourinary: Negative.   Musculoskeletal: Positive for arthralgias (both legs but R>L). Negative for back pain and neck pain.  Skin: Negative.   Allergic/Immunologic: Negative.   Neurological: Negative for dizziness, tremors, syncope, weakness and light-headedness.  Hematological: Negative for adenopathy. Does not bruise/bleed easily.  Psychiatric/Behavioral: Negative for dysphoric mood and sleep disturbance (sleeping on 2 pillows). The patient is not nervous/anxious.    Vitals:   03/15/21 0844  BP: 129/72  Pulse: 95  Resp: 20  SpO2: 100%  Weight: 211 lb 8 oz (95.9 kg)  Height: 5\' 11"  (1.803 m)   Wt Readings from Last 3 Encounters:  03/15/21 211 lb 8 oz (95.9 kg)  02/18/21 214 lb (97.1 kg)  01/08/21 210 lb (95.3 kg)   Lab Results  Component Value Date   CREATININE 1.41 (H) 01/29/2021   CREATININE 1.01 05/29/2020   CREATININE 0.95 04/14/2020    Physical Exam Vitals and nursing note reviewed.  Constitutional:      General: He is not in acute distress.    Appearance: Normal appearance. He is well-developed. He is not ill-appearing, toxic-appearing or diaphoretic.  HENT:     Head: Normocephalic and atraumatic.  Neck:     Vascular: No JVD.  Cardiovascular:     Rate and Rhythm: Normal rate and regular rhythm.  Pulmonary:     Effort: Pulmonary effort is normal. No respiratory distress.     Breath sounds: No wheezing, rhonchi or rales.  Abdominal:     Tenderness: There is no abdominal tenderness.  Musculoskeletal:     Cervical back: Normal range of motion and neck supple.     Right lower leg: No edema.     Left lower leg: No edema.  Neurological:     General: No focal deficit present.     Mental Status: He is alert and oriented to person, place, and time.   Psychiatric:        Mood and Affect: Mood normal.        Behavior: Behavior normal.    Assessment & Plan:  1: Chronic heart failure with reduced ejection fraction- - NYHA class I - euvolemic today - weighing daily; reminded to call for an overnight weight gain of >2 pounds or a weekly weight gain of >5 pounds - weight stable from last visit here 2 months ago - not adding salt but does rely on others bringing food or eating convenient food; admits to eating 3 full meals a day along with snacking during the day and not getting much exercise due to leg pain - saw cardiology 04/16/2020) 03/05/21 -  on GDMT of dapagliflozin, metoprolol, entresto, spironolactone - also taking hydralazine and isosorbide; could consider changing this to bidil - BNP 02/20/20 was 532.0 - reports receiving all 3 covid vaccines - PharmD reconciled medications w/ the patient; difficult to do extensively as his aunt is the one who manages his medications and she was not with him today due to recent surgery  2: HTN- - BP looks good today - saw PCP at [facility]) ~ 1 month ago - BMP on 11/02/20 reviewed and showed sodium 136, potassium 5.0, creatinine 1.2 and GFR 74  3. Atherosclerosis of lower legs- - saw vascular Gilda Crease) 02/18/21 - has pending neck surgery before stenting of legs can occur   Medication bottles reviewed.   Return in 3 months or sooner for any questions/problems before then.

## 2021-03-15 NOTE — Progress Notes (Signed)
[place] - PHARMACIST COUNSELING NOTE  ADHERENCE ASSESSMENT    Do you ever forget to take your medication? [] Yes (1) [x] No (0)  Do you ever skip doses due to side effects? [] Yes (1) [x] No (0)  Do you have trouble affording your medicines? [] Yes (1) [x] No (0)  Are you ever unable to pick up your medication due to transportation difficulties? [] Yes (1) [x] No (0)  Do you ever stop taking your medications because you don't believe they are helping? [] Yes (1) [x] No (0)  Total score _0______    Recommendations given to patient about increasing adherence: N/A  Guideline-Directed Medical Therapy/Evidence Based Medicine  ACE/ARB/ARNI: Entresto 49-51 mg twice daily  Beta Blocker: metoprolol succinate 25 mg daily  Aldosterone Antagonist: spironolactone 12.5 mg daily  Diuretic: furosemide 20 mg daily     SUBJECTIVE  HPI:  Past Medical History:  Diagnosis Date  . Atrial flutter ([place])   . CHF (congestive heart failure) ([place])   . DVT (deep venous thrombosis) ([place])   . Hypertension   . Migraines   . Thrombocytopenia ([place])         OBJECTIVE   Vital signs: HR 95, BP 129/72, weight 95.9 kg ECHO: Date 02/14/20, EF <20%  BMP Latest Ref Rng & Units 01/29/2021 05/29/2020 04/14/2020  Glucose 70 - 99 mg/dL - 96 93  BUN 8 - 23 mg/dL 31(H) 20 27(H)  Creatinine 0.61 - 1.24 mg/dL 1.41(H) 1.01 0.95  Sodium 135 - 145 mmol/L - 138 134(L)  Potassium 3.5 - 5.1 mmol/L - 4.3 4.2  Chloride 98 - 111 mmol/L - 109 107  CO2 22 - 32 mmol/L - 22 21(L)  Calcium 8.9 - 10.3 mg/dL - 9.4 9.8    ASSESSMENT 65 yo M presenting to heart failure clinic for follow-up visit. PMH includes HTN, DVT, CHF, and a flutter. No barriers to adherence were identified during medication reconciliation.    PLAN CHF/HTN/CV health - Continue dapagliflozin 10 mg daily  - Continue furosemide 20 mg daily  - Continue hydralazine 25 mg three times daily  - Continue aspirin 81 mg  daily  - Continue atorvastatin 40 mg daily - Continue isosorbide mononitrate 30 mg daily  - Continue metoprolol succinate 25 mg daily - Continue Entresto 49-51 mg twice daily  - Continue spironolactone 12.5 mg daily   Aflutter/hx of DVT - Continue apixaban 5 mg twice daily   Pain - Continue gabapentin 300 mg at bedtime  General Health - Continue folic acid 1 mg daily   Time spent: 10 minutes  Benn Moulder, PharmD Pharmacy Resident  03/15/2021 10:45 AM    Current Outpatient Medications:  .  apixaban (ELIQUIS) 5 MG TABS tablet, Take 1 tablet (5 mg total) by mouth 2 (two) times daily. (Patient taking differently: Take 5 mg by mouth every 12 (twelve) hours.), Disp: 60 tablet, Rfl: 0 .  aspirin 81 MG EC tablet, Take 81 mg by mouth daily., Disp: , Rfl:  .  atorvastatin (LIPITOR) 40 MG tablet, Take 1 tablet (40 mg total) by mouth daily at 10 pm., Disp: 30 tablet, Rfl: 0 .  dapagliflozin propanediol (FARXIGA) 10 MG TABS tablet, Take 10 mg by mouth daily., Disp: , Rfl:  .  folic acid (FOLVITE) 1 MG tablet, Take 1 mg by mouth daily with lunch., Disp: , Rfl:  .  furosemide (LASIX) 40 MG tablet, Take 20 mg by mouth daily., Disp: , Rfl:  .  gabapentin (NEURONTIN) 300 MG capsule, Take 300  mg by mouth at bedtime., Disp: , Rfl:  .  gabapentin (NEURONTIN) 300 MG capsule, TAKE ONE CAPSULE BY MOUTH AT BEDTIME FOR PAIN, Disp: 90 capsule, Rfl: 2 .  hydrALAZINE (APRESOLINE) 25 MG tablet, Take 25 mg by mouth 3 (three) times daily., Disp: , Rfl:  .  isosorbide mononitrate (IMDUR) 30 MG 24 hr tablet, Take 30 mg by mouth daily., Disp: , Rfl:  .  metoprolol succinate (TOPROL-XL) 25 MG 24 hr tablet, Take 25 mg by mouth daily., Disp: , Rfl:  .  sacubitril-valsartan (ENTRESTO) 49-51 MG, Take 1 tablet by mouth 2 (two) times daily., Disp: 180 tablet, Rfl: 3 .  spironolactone (ALDACTONE) 25 MG tablet, Take 0.5 tablets (12.5 mg total) by mouth daily. (Patient taking differently: Take 25 mg by mouth  daily.), Disp: 30 tablet, Rfl: 0 .  thiamine 100 MG tablet, Take 1 tablet (100 mg total) by mouth daily., Disp: 30 tablet, Rfl: 0   COUNSELING POINTS/CLINICAL PEARLS  Metoprolol Succinate (Goal: 200 mg once daily) Warn patient to avoid activities requiring mental alertness or coordination until drug effects are realized, as drug may cause dizziness. Tell patient planning major surgery with anesthesia to alert physician that drug is being used, as drug impairs ability of heart to respond to reflex adrenergic stimuli. Drug may cause diarrhea, fatigue, headache, or depression. Advise diabetic patient to carefully monitor blood glucose as drug may mask symptoms of hypoglycemia. Patient should take extended-release tablet with or immediately following meals. Counsel patient against sudden discontinuation of drug, as this may precipitate hypertension, angina, or myocardial infarction. In the event of a missed dose, counsel patient to skip the missed dose and maintain a regular dosing schedule. Entresto (Goal: 97/103 mg twice daily)  Warn male patient to avoid pregnancy during therapy and to report a pregnancy to a physician.  Advise patient to report symptomatic hypotension.  Side effects may include hyperkalemia, cough, dizziness, or renal failure. Furosemide  Drug causes sun-sensitivity. Advise patient to use sunscreen and avoid tanning beds. Patient should avoid activities requiring coordination until drug effects are realized, as drug may cause dizziness, vertigo, or blurred vision. This drug may cause hyperglycemia, hyperuricemia, constipation, diarrhea, loss of appetite, nausea, vomiting, purpuric disorder, cramps, spasticity, asthenia, headache, paresthesia, or scaling eczema. Instruct patient to report unusual bleeding/bruising or signs/symptoms of hypotension, infection, pancreatitis, or ototoxicity (tinnitus, hearing impairment). Advise patient to report signs/symptoms of a severe skin  reactions (flu-like symptoms, spreading red rash, or skin/mucous membrane blistering) or erythema multiforme. Instruct patient to eat high-potassium foods during drug therapy, as directed by healthcare professional.  Patient should not drink alcohol while taking this drug. Spironolactone  Warn patient to report dehydration, hypotension, or symptoms of worsening renal function.  Counsel male patient to report gynecomastia.  Side effects may include diarrhea, nausea, vomiting, abdominal cramping, fever, leg cramps, lethargy, mental confusion, decreased libido, irregular menses, and rash. Suspension: Tell patient to take drug consistently with respect to food, either before or after a meal.  Advise patient to avoid potassium supplements and foods containing high levels of potassium, including salt substitutes.  DRUGS TO AVOID IN HEART FAILURE  Drug or Class Mechanism  Analgesics . NSAIDs . COX-2 inhibitors . Glucocorticoids  Sodium and water retention, increased systemic vascular resistance, decreased response to diuretics   Diabetes Medications . Metformin . Thiazolidinediones o Rosiglitazone (Avandia) o Pioglitazone (Actos) . DPP4 Inhibitors o Saxagliptin (Onglyza) o Sitagliptin (Januvia)   Lactic acidosis Possible calcium channel blockade   Unknown  Antiarrhythmics .  Class I  o Flecainide o Disopyramide . Class III o Sotalol . Other o Dronedarone  Negative inotrope, proarrhythmic   Proarrhythmic, beta blockade  Negative inotrope  Antihypertensives . Alpha Blockers o Doxazosin . Calcium Channel Blockers o Diltiazem o Verapamil o Nifedipine . Central Alpha Adrenergics o Moxonidine . Peripheral Vasodilators o Minoxidil  Increases renin and aldosterone  Negative inotrope    Possible sympathetic withdrawal  Unknown  Anti-infective . Itraconazole . Amphotericin B  Negative inotrope Unknown  Hematologic . Anagrelide . Cilostazol   Possible inhibition  of PD IV Inhibition of PD III causing arrhythmias  Neurologic/Psychiatric . Stimulants . Anti-Seizure Drugs o Carbamazepine o Pregabalin . Antidepressants o Tricyclics o Citalopram . Parkinsons o Bromocriptine o Pergolide o Pramipexole . Antipsychotics o Clozapine . Antimigraine o Ergotamine o Methysergide . Appetite suppressants . Bipolar o Lithium  Peripheral alpha and beta agonist activity  Negative inotrope and chronotrope Calcium channel blockade  Negative inotrope, proarrhythmic Dose-dependent QT prolongation  Excessive serotonin activity/valvular damage Excessive serotonin activity/valvular damage Unknown  IgE mediated hypersensitivy, calcium channel blockade  Excessive serotonin activity/valvular damage Excessive serotonin activity/valvular damage Valvular damage  Direct myofibrillar degeneration, adrenergic stimulation  Antimalarials . Chloroquine . Hydroxychloroquine Intracellular inhibition of lysosomal enzymes  Urologic Agents . Alpha Blockers o Doxazosin o Prazosin o Tamsulosin o Terazosin  Increased renin and aldosterone  Adapted from Page RL, et al. "Drugs That May Cause or Exacerbate Heart Failure: A Scientific Statement from the [place]." Circulation 2016; 638:G66-Z99. DOI: 10.1161/CIR.0000000000000426   MEDICATION ADHERENCES TIPS AND STRATEGIES 1. Taking medication as prescribed improves patient outcomes in heart failure (reduces hospitalizations, improves symptoms, increases survival) 2. Side effects of medications can be managed by decreasing doses, switching agents, stopping drugs, or adding additional therapy. Please let someone in the [facility] know if you have having bothersome side effects so we can modify your regimen. Do not alter your medication regimen without talking to [place].  3. Medication reminders can help patients remember to take drugs on time. If you are missing or forgetting doses you can try  linking behaviors, using pill boxes, or an electronic reminder like an alarm on your phone or an app. Some people can also get automated phone calls as medication reminders.

## 2021-03-15 NOTE — Patient Instructions (Addendum)
Resume weighing daily and call for an overnight weight gain of > 2 pounds or a weekly weight gain of >5 pounds. 

## 2021-03-19 ENCOUNTER — Telehealth: Payer: Self-pay | Admitting: Family

## 2021-03-19 NOTE — Telephone Encounter (Signed)
Unable to reach patient in attempt to notify patient his novartis patient assistance application is due for renewal and he will need to come in to sign it.  Shawn Taylor, NT

## 2021-03-29 ENCOUNTER — Other Ambulatory Visit: Payer: Self-pay | Admitting: Neurosurgery

## 2021-04-05 ENCOUNTER — Encounter
Admission: RE | Admit: 2021-04-05 | Discharge: 2021-04-05 | Disposition: A | Discharge status: Home / Self Care | Payer: Medicaid Other | Source: Ambulatory Visit | Attending: Neurosurgery | Admitting: Neurosurgery

## 2021-04-05 ENCOUNTER — Other Ambulatory Visit: Payer: Self-pay

## 2021-04-05 DIAGNOSIS — Z01812 Encounter for preprocedural laboratory examination: Secondary | ICD-10-CM | POA: Insufficient documentation

## 2021-04-05 HISTORY — DX: Occlusion and stenosis of unspecified carotid artery: I65.29

## 2021-04-05 LAB — URINALYSIS, ROUTINE W REFLEX MICROSCOPIC
Bilirubin Urine: NEGATIVE
Glucose, UA: >=500 mg/dL — AB
Hgb urine dipstick: NEGATIVE
Ketones, ur: NEGATIVE mg/dL
Leukocytes,Ua: NEGATIVE
Nitrite: NEGATIVE
Protein, ur: NEGATIVE mg/dL
Specific Gravity, Urine: 1.015 (ref 1.005–1.030)
pH: 5 (ref 5.0–8.0)

## 2021-04-05 LAB — TYPE AND SCREEN
ABO/RH(D): B POS
Antibody Screen: NEGATIVE

## 2021-04-05 LAB — CBC
HCT: 41.3 % (ref 39.0–52.0)
Hemoglobin: 13.4 g/dL (ref 13.0–17.0)
MCH: 29.1 pg (ref 26.0–34.0)
MCHC: 32.4 g/dL (ref 30.0–36.0)
MCV: 89.6 fL (ref 80.0–100.0)
Platelets: 329 10*3/uL (ref 150–400)
RBC: 4.61 MIL/uL (ref 4.22–5.81)
RDW: 13.8 % (ref 11.5–15.5)
WBC: 13.1 10*3/uL — ABNORMAL HIGH (ref 4.0–10.5)
nRBC: 0 % (ref 0.0–0.2)

## 2021-04-05 LAB — BASIC METABOLIC PANEL
Anion gap: 8 (ref 5–15)
BUN: 44 mg/dL — ABNORMAL HIGH (ref 8–23)
CO2: 20 mmol/L — ABNORMAL LOW (ref 22–32)
Calcium: 9.3 mg/dL (ref 8.9–10.3)
Chloride: 109 mmol/L (ref 98–111)
Creatinine, Ser: 1.68 mg/dL — ABNORMAL HIGH (ref 0.61–1.24)
GFR, Estimated: 45 mL/min — ABNORMAL LOW (ref >60)
Glucose, Bld: 128 mg/dL — ABNORMAL HIGH (ref 70–99)
Potassium: 5.1 mmol/L (ref 3.5–5.1)
Sodium: 137 mmol/L (ref 135–145)

## 2021-04-05 LAB — SURGICAL PCR SCREEN
MRSA, PCR: NEGATIVE
Staphylococcus aureus: NEGATIVE

## 2021-04-05 LAB — PROTIME-INR
INR: 1.1 (ref 0.8–1.2)
Prothrombin Time: 14.1 seconds (ref 11.4–15.2)

## 2021-04-05 LAB — APTT: aPTT: 31 seconds (ref 24–36)

## 2021-04-05 NOTE — Patient Instructions (Addendum)
Your procedure is scheduled on:04-17-21 WEDNESDAY  Report to the Registration Desk on the 1st floor of the Medical Mall-Then proceed to the 2nd floor Surgery Desk in the Medical Mall To find out your arrival time, please call [phone] between 1PM - 3PM on:04-16-21 TUESDAY  REMEMBER: Instructions that are not followed completely may result in serious medical risk, up to and including death; or upon the discretion of your surgeon and anesthesiologist your surgery may need to be rescheduled.  Do not eat food after midnight the night before surgery.  No gum chewing, lozengers or hard candies.  You may however, drink CLEAR liquids up to 2 hours before you are scheduled to arrive for your surgery. Do not drink anything within 2 hours of your scheduled arrival time.  Clear liquids include: - water  - apple juice without pulp - gatorade  - black coffee or tea (Do NOT add milk or creamers to the coffee or tea) Do NOT drink anything that is not on this list.  TAKE THESE MEDICATIONS THE MORNING OF SURGERY WITH A SIP OF WATER: -HYDRALAZINE (APRESOLINE) -IMDUR (ISOSORBIDE) -METOPROLOL (TOPROL)  STOP YOUR ELIQUIS 3 DAYS PRIOR TO SURGERY PER DR Jacki Cones DOSE OF ELIQUIS WILL BE ON 04-13-21 Saturday- STOP YOUR 81 MG ASPIRIN 1 WEEK PRIOR TO SURGERY-LAST DOSE OF ASPIRIN WILL BE ON 04-09-21 TUESDAY  One week prior to surgery: Stop Anti-inflammatories (NSAIDS) such as Advil, Aleve, Ibuprofen, Motrin, Naproxen, Naprosyn and Aspirin based products such as Excedrin, Goodys Powder, BC Powder-OK TO TAKE TYLENOL IF NEEDED   Stop ANY OVER THE COUNTER supplements/vitamins NOW 7 DAYS PRIOR TO SURGERY-LAST DOSE OF THIAMINE AND FOLIC ACID WILL BE ON 04-09-21   No Alcohol for 24 hours before or after surgery.  No Smoking including e-cigarettes for 24 hours prior to surgery.  No chewable tobacco products for at least 6 hours prior to surgery.  No nicotine patches on the day of surgery.  Do not use any  "recreational" drugs for at least a week prior to your surgery.  Please be advised that the combination of cocaine and anesthesia may have negative outcomes, up to and including death. If you test positive for cocaine, your surgery will be cancelled.  On the morning of surgery brush your teeth with toothpaste and water, you may rinse your mouth with mouthwash if you wish. Do not swallow any toothpaste or mouthwash.  Do not wear jewelry, make-up, hairpins, clips or nail polish.  Do not wear lotions, powders, or perfumes.   Do not shave body from the neck down 48 hours prior to surgery just in case you cut yourself which could leave a site for infection.  Also, freshly shaved skin may become irritated if using the CHG soap.  Contact lenses, hearing aids and dentures may not be worn into surgery.  Do not bring valuables to the hospital. [facility] is not responsible for any missing/lost belongings or valuables.   Use CHG Soap as directed on instruction sheet.  Notify your doctor if there is any change in your medical condition (cold, fever, infection).  Wear comfortable clothing (specific to your surgery type) to the hospital.  Plan for stool softeners for home use; pain medications have a tendency to cause constipation. You can also help prevent constipation by eating foods high in fiber such as fruits and vegetables and drinking plenty of fluids as your diet allows.  After surgery, you can help prevent lung complications by doing breathing exercises.  Take deep breaths and  cough every 1-2 hours. Your doctor may order a device called an Incentive Spirometer to help you take deep breaths. When coughing or sneezing, hold a pillow firmly against your incision with both hands. This is called "splinting." Doing this helps protect your incision. It also decreases belly discomfort.  If you are being admitted to the hospital overnight, leave your suitcase in the car. After surgery it may be  brought to your room.  If you are being discharged the day of surgery, you will not be allowed to drive home. You will need a responsible adult (18 years or older) to drive you home and stay with you that night.   If you are taking public transportation, you will need to have a responsible adult (18 years or older) with you. Please confirm with your physician that it is acceptable to use public transportation.   Please call the Pre-admissions Testing Dept. at [phone] if you have any questions about these instructions.  Surgery Visitation Policy:  Patients undergoing a surgery or procedure may have one family member or support person with them as long as that person is not COVID-19 positive or experiencing its symptoms.  That person may remain in the waiting area during the procedure.  Inpatient Visitation:    Visiting hours are 7 a.m. to 8 p.m. Inpatients will be allowed two visitors daily. The visitors may change each day during the patient's stay. No visitors under the age of 20. Any visitor under the age of 64 must be accompanied by an adult. The visitor must pass COVID-19 screenings, use hand sanitizer when entering and exiting the patient's room and wear a mask at all times, including in the patient's room. Patients must also wear a mask when staff or their visitor are in the room. Masking is required regardless of vaccination status.

## 2021-04-12 ENCOUNTER — Encounter: Payer: Self-pay | Admitting: Neurosurgery

## 2021-04-12 NOTE — Progress Notes (Signed)
Perioperative Services  Pre-Admission/Anesthesia Testing Clinical Review  Date: 04/12/21  Patient Demographics:  Name: Shawn Taylor DOB:   Apr 12, 1956 MRN:   [id number]  Planned Surgical Procedure(s):    Case: [id number] Date/Time: 04/17/21 0700   Procedures:      ANTERIOR CERVICAL CORPECTOMY (N/A ) - 2nd case     C5-6 ANTERIOR CERVICAL DECOMPRESSION/DISCECTOMY FUSION (N/A )   Anesthesia type: General   Pre-op diagnosis: g95.9 cervical myelopathy   Location: [facility] OR ROOM 03 / [facility]   Surgeons: Venetia Night, MD    NOTE: Available PAT nursing documentation and vital signs have been reviewed. Clinical nursing staff has updated patient's PMH/PSHx, current medication list, and drug allergies/intolerances to ensure comprehensive history available to assist in medical decision making as it pertains to the aforementioned surgical procedure and anticipated anesthetic course.   Clinical Discussion:  Shawn Taylor is a 65 y.o. male who is submitted for pre-surgical anesthesia review and clearance prior to him undergoing the above procedure. Patient is a Current Smoker (13.75 pack years). Pertinent PMH includes: CAD, MI, cardiac arrest, atrial flutter, CHF, PAD, carotid artery stenosis, HTN, HLD, DOE, HCV (s/p Harvoni course).  Patient is followed by cardiology Gwen Pounds, MD). He was last seen in the cardiology clinic on 03/05/2021; notes reviewed.  At the time of his clinic visit, patient doing fairly well overall from a cardiovascular perspective.  He denied chest pain, however complained of chronic exertional dyspnea related to his underlying CHF diagnosis.  Patient also experiencing claudication pain with ambulation in the setting of known PAD.  Patient with history of significant CV events:   He was seen in the ED on 02/13/2020 for progressive worsening shortness of breath and chest pressure x 3-4 weeks.  During this time of the ED, patient decompensated and  developed respiratory failure leading to cardiopulmonary arrest; CODE BLUE was called.  CPR and intubation performed; ROSC achieved.     Post arrest, patient found to be in new onset atrial flutter with RVR.  Patient converted to NSR with diltiazem and amiodarone drip.     Patient with severe metabolic acidosis and pulmonary edema.     Bilateral lower extremity Dopplers revealed multiple occlusive DVTs in the patient's BILATERAL lower extremities.   TTE performed on 02/14/2020 revealed severely decreased left ventricular systolic function with an EF <20%.  Right ventricular systolic function also severely reduced.  There was mild to moderate valvular insufficiency noted on exam.     During admission, patient developed multisystem organ failure.     Patient was difficult to wean from the vent following respiratory failure event; finally extubated on 02/21/2020.  Condition continued to improve following extubation.  Patient was discharged home in stable condition on 03/07/2020.   Myocardial perfusion imaging study performed on 03/04/2021 revealed no evidence of stress-induced myocardial ischemia or arrhythmia; LVEF 50% (see full interpretation of cardiovascular testing below).  Patient is chronically anticoagulated using daily apixaban.  He also takes a daily low-dose ASA.  Patient on GDMT for his HTN and HLD diagnoses.  Blood pressure well controlled at 110/70 on currently prescribed nitrate, diuretic, ARB/ARNI therapies.  Patient is on a statin for his HLD. Functional capacity, as defined by DASI, is documented as being </= 4 METS.  Patient was started on beta-blocker therapy for rate control in the setting of atrial flutter.  No other changes were made to his medication regimen.  Patient to follow-up with outpatient cardiology at defined intervals for ongoing care  management.  Patient is scheduled for an anterior cervical corpectomy on 04/17/2021 with Dr. Venetia Night.  Given patient's  past medical history significant for cardiovascular diagnoses, presurgical cardiac clearance was sought by the performing surgeon's office and PAT team.  Per cardiology, "this patient is at the lowest risk possible for cardiovascular complication for the planned procedure.  He may proceed to surgery with an overall LOW risk of perioperative cardiovascular complications".  This patient is on daily anticoagulation therapy. He has been instructed on recommendations for holding his daily low-dose ASA for 7 days (last dose 04/09/2021) and his apixaban for 3 days (last dose 04/13/2021) prior to his procedure. Plan will be to restart as soon as postoperative bleeding risk felt to be minimized by his attending surgeon.   Patient denies previous perioperative complications with anesthesia in the past. In review of the available records, it is noted that patient underwent a general anesthetic course here (ASA III) in 01/2021 without documented complications.   Vitals with BMI 04/05/2021 03/15/2021 02/18/2021  Height 5\' 11"  5\' 11"  -  Weight 210 lbs 9 oz 211 lbs 8 oz 214 lbs  BMI 29.38 29.51 -  Systolic 124 129  Diastolic 81 72 73  Pulse 82 95 85    Providers/Specialists:   NOTE: Primary physician provider listed below. Patient may have been seen by APP or partner within same practice.   PROVIDER ROLE / SPECIALTY LAST , MD  Neurosurgery  03/06/2021   Revelo, Donalynn Furlong, MD  Primary Care Provider  ???  03/08/2021, MD  Cardiology  03/05/2021   Allergies:  Patient has no known allergies.  Current Home Medications:   No current facility-administered medications for this encounter.   Arnoldo Hooker apixaban (ELIQUIS) 5 MG TABS tablet  . aspirin 81 MG EC tablet  . atorvastatin (LIPITOR) 40 MG tablet  . dapagliflozin propanediol (FARXIGA) 10 MG TABS tablet  . folic acid (FOLVITE) 1 MG tablet  . furosemide (LASIX) 40 MG tablet  . gabapentin (NEURONTIN) 300 MG capsule  .  hydrALAZINE (APRESOLINE) 25 MG tablet  . isosorbide mononitrate (IMDUR) 30 MG 24 hr tablet  . metoprolol succinate (TOPROL-XL) 25 MG 24 hr tablet  . sacubitril-valsartan (ENTRESTO) 49-51 MG  . spironolactone (ALDACTONE) 25 MG tablet  . thiamine 100 MG tablet   History:   Past Medical History:  Diagnosis Date  . Atrial flutter (HCC)   . CAD (coronary artery disease)   . Cardiac arrest (HCC) 02/12/2020   WITH HIS MI  . Carotid artery occlusion   . Cervical myelopathy (HCC)   . CHF (congestive heart failure) (HCC)   . DOE (dyspnea on exertion)   . DVT (deep venous thrombosis) (HCC)   . HCV (hepatitis C virus) 2019   Tx'd with Harvoni course  . HLD (hyperlipidemia)   . Hypertension   . Migraines   . Myocardial infarction (HCC) 02/12/2020  . PAD (peripheral artery disease) (HCC)   . Polysubstance abuse (HCC)    Cocaine and ETOH  . Thrombocytopenia (HCC)    Past Surgical History:  Procedure Laterality Date  . LOWER EXTREMITY ANGIOGRAPHY Right 01/29/2021   Procedure: LOWER EXTREMITY ANGIOGRAPHY;  Surgeon: 02/14/2020, MD;  Location: [facility] INVASIVE CV LAB;  Service: Cardiovascular;  Laterality: Right;   Family History  Problem Relation Age of Onset  . Diabetes Sister    Social History   Tobacco Use  . Smoking status: Current Every Day Smoker    Packs/day: 0.25  Years: 55.00    Pack years: 13.75    Types: Cigarettes  . Smokeless tobacco: Never Used  Substance Use Topics  . Alcohol use: Not Currently    Alcohol/week: 2.0 - 3.0 standard drinks    Types: 2 - 3 Cans of beer per week  . Drug use: Not Currently    Frequency: 1.0 times per week    Types: Cocaine    Comment: PT DENIES DURING ANESTHESIA INTERVIEW ON 04-05-21    Pertinent Clinical Results:  LABS: Labs reviewed: Acceptable for surgery.  No visits with results within 3 Day(s) from this visit.  Latest known visit with results is:  Hospital Outpatient Visit on 04/05/2021  Component Date Value Ref Range  Status  . aPTT 04/05/2021 31  24 - 36 seconds Final   Performed at [facility], [address] [place] [place], [place] [address]  . Sodium 04/05/2021 137  135 - 145 mmol/L Final  . Potassium 04/05/2021 5.1  3.5 - 5.1 mmol/L Final  . Chloride 04/05/2021 109  98 - 111 mmol/L Final  . CO2 04/05/2021 20* 22 - 32 mmol/L Final  . Glucose, Bld 04/05/2021 128* 70 - 99 mg/dL Final   Glucose reference range applies only to samples taken after fasting for at least 8 hours.  . BUN 04/05/2021 44* 8 - 23 mg/dL Final  . Creatinine, Ser 04/05/2021 1.68* 0.61 - 1.24 mg/dL Final  . Calcium 26/94/8546 9.3  8.9 - 10.3 mg/dL Final  . GFR, Estimated 04/05/2021 45* >60 mL/min Final   Comment: (NOTE) Calculated using the CKD-EPI Creatinine Equation (2021)   . Anion gap 04/05/2021 8  5 - 15 Final   Performed at [facility], [address] [place] [place], [place] [address]  . WBC 04/05/2021 13.1* 4.0 - 10.5 K/uL Final  . RBC 04/05/2021 4.61  4.22 - 5.81 MIL/uL Final  . Hemoglobin 04/05/2021 13.4  13.0 - 17.0 g/dL Final  . HCT 00/93/8182 41.3  39.0 - 52.0 % Final  . MCV 04/05/2021 89.6  80.0 - 100.0 fL Final  . MCH 04/05/2021 29.1  26.0 - 34.0 pg Final  . MCHC 04/05/2021 32.4  30.0 - 36.0 g/dL Final  . RDW 99/37/1696 13.8  11.5 - 15.5 % Final  . Platelets 04/05/2021 329  150 - 400 K/uL Final  . nRBC 04/05/2021 0.0  0.0 - 0.2 % Final   Performed at [facility], [address] [place], [place] [address]  . Prothrombin Time 04/05/2021 14.1  11.4 - 15.2 seconds Final  . INR 04/05/2021 1.1  0.8 - 1.2 Final   Comment: (NOTE) INR goal varies based on device and disease states. Performed at [facility], [address] [place], [place] [address]   . MRSA, PCR 04/05/2021 NEGATIVE  NEGATIVE Final  . Staphylococcus aureus 04/05/2021 NEGATIVE  NEGATIVE Final   Comment: (NOTE) The Xpert SA Assay (FDA approved for NASAL specimens in patients 80 years of age and older), is one  component of a comprehensive surveillance program. It is not intended to diagnose infection nor to guide or monitor treatment. Performed at [facility], [address] [place], [place] [address]   . ABO/RH(D) 04/05/2021 B POS   Final  . Antibody Screen 04/05/2021 NEG   Final  . Sample Expiration 04/05/2021 04/19/2021,2359   Final  . Extend sample reason 04/05/2021    Final                   Value:NO  TRANSFUSIONS OR PREGNANCY IN THE PAST 3 MONTHS Performed at [facility], [address] [place] [place], [place] [address]   . Color, Urine 04/05/2021 YELLOW* YELLOW Final  . APPearance 04/05/2021 CLEAR* CLEAR Final  . Specific Gravity, Urine 04/05/2021 1.015  1.005 - 1.030 Final  . pH 04/05/2021 5.0  5.0 - 8.0 Final  . Glucose, UA 04/05/2021 >=500* NEGATIVE mg/dL Final  . Hgb urine dipstick 04/05/2021 NEGATIVE  NEGATIVE Final  . Bilirubin Urine 04/05/2021 NEGATIVE  NEGATIVE Final  . Ketones, ur 04/05/2021 NEGATIVE  NEGATIVE mg/dL Final  . Protein, ur 73/41/9379 NEGATIVE  NEGATIVE mg/dL Final  . Nitrite 02/40/9735 NEGATIVE  NEGATIVE Final  . Glori Luis 04/05/2021 NEGATIVE  NEGATIVE Final  . WBC, UA 04/05/2021 0-5  0 - 5 WBC/hpf Final  . Bacteria, UA 04/05/2021 RARE* NONE SEEN Final  . Squamous Epithelial / LPF 04/05/2021 0-5  0 - 5 Final   Performed at [facility], [address] [place], [place] [address]    ECG: Date: 03/04/2021 Rate: 70 bpm Rhythm: normal sinus; LVH Axis (leads I and aVF): Normal ST segment and T wave changes: No acute ST or T waves abnormalities noted Comparison: 02/19/2021 NOTE: Tracing obtained at [facility]; unable for review. Above based on cardiologist's interpretation.    IMAGING / PROCEDURES: LEXISCAN performed on 03/04/2021 1. LVEF 50% 2. Regional wall motion reveals normal myocardial thickening and wall motion 3. No evidence of stress-induced myocardial ischemia or arrhythmia 4. No artifacts noted 5. Left  ventricular cavity size normal 6. The overall quality of study is good  ECHOCARDIOGRAM performed on 02/14/2020 1. Left ventricular ejection fraction, by estimation, is <20%. The left ventricle has severely decreased function. The left ventricle demonstrates global hypokinesis. The left ventricular internal cavity size was moderately dilated. Left ventricular diastolic parameters were normal.  2. Right ventricular systolic function is severely reduced. The right ventricular size is mildly enlarged. There is normal pulmonary artery systolic pressure.  3. Left atrial size was mildly dilated.  4. Right atrial size was mildly dilated.  5. The mitral valve is normal in structure. Moderate mitral valve regurgitation.  6. Tricuspid valve regurgitation is moderate.  7. The aortic valve is normal in structure. Aortic valve regurgitation is trivial.   Impression and Plan:  Shawn Taylor has been referred for pre-anesthesia review and clearance prior to him undergoing the planned anesthetic and procedural courses. Available labs, pertinent testing, and imaging results were personally reviewed by me. This patient has been appropriately cleared by cardiology with an overall LOW risk of significant perioperative cardiovascular complications.  Based on clinical review performed today (04/12/21), barring any significant acute changes in the patient's overall condition, it is anticipated that he will be able to proceed with the planned surgical intervention. Any acute changes in clinical condition may necessitate his procedure being postponed and/or cancelled. Patient will meet with anesthesia team (MD and/or CRNA) on the day of his procedure for preoperative evaluation/assessment. Questions regarding anesthetic course will be fielded at that time.   Pre-surgical instructions were reviewed with the patient during his PAT appointment and questions were fielded by PAT clinical staff. Patient was advised that if any  questions or concerns arise prior to his procedure then he should return a call to PAT and/or his surgeon's office to discuss.  Quentin Mulling, MSN, APRN, FNP-C, CEN [facility]  Peri-operative Services Nurse Practitioner Phone: [phone] 04/12/21 10:11 AM  NOTE: This note has been prepared using Dragon dictation  software. Despite my best ability to proofread, there is always the potential that unintentional transcriptional errors may still occur from this process.

## 2021-04-15 ENCOUNTER — Other Ambulatory Visit: Payer: Self-pay

## 2021-04-15 ENCOUNTER — Other Ambulatory Visit
Admission: RE | Admit: 2021-04-15 | Discharge: 2021-04-15 | Disposition: A | Discharge status: Home / Self Care | Payer: Medicaid Other | Source: Ambulatory Visit | Attending: Neurosurgery | Admitting: Neurosurgery

## 2021-04-15 DIAGNOSIS — Z20822 Contact with and (suspected) exposure to covid-19: Secondary | ICD-10-CM | POA: Insufficient documentation

## 2021-04-15 DIAGNOSIS — Z01812 Encounter for preprocedural laboratory examination: Secondary | ICD-10-CM | POA: Diagnosis not present

## 2021-04-15 LAB — SARS CORONAVIRUS 2 (TAT 6-24 HRS): SARS Coronavirus 2: NEGATIVE

## 2021-04-16 MED ORDER — LACTATED RINGERS IV SOLN: INTRAVENOUS | Status: DC

## 2021-04-16 MED ORDER — FAMOTIDINE 20 MG PO TABS
20.0000 mg | ORAL_TABLET | Freq: Once | ORAL | Status: AC
  Administered 2021-04-17: 20 mg via ORAL

## 2021-04-16 MED ORDER — CEFAZOLIN SODIUM-DEXTROSE 2-4 GM/100ML-% IV SOLN
2.0000 g | INTRAVENOUS | Status: AC
  Administered 2021-04-17: 2 g via INTRAVENOUS

## 2021-04-16 MED ORDER — CHLORHEXIDINE GLUCONATE 0.12 % MT SOLN
15.0000 mL | Freq: Once | OROMUCOSAL | Status: AC
  Administered 2021-04-17: 15 mL via OROMUCOSAL

## 2021-04-16 MED ORDER — ORAL CARE MOUTH RINSE
15.0000 mL | Freq: Once | OROMUCOSAL | Status: AC
Start: 2021-04-16 — End: 2021-04-17

## 2021-04-16 NOTE — Progress Notes (Signed)
Pharmacy Antibiotic Note  Shawn Taylor is a 65 y.o. male admitted on (Not on file) with surgical prophylaxis.  Pharmacy has been consulted for Cefazolin dosing.  Plan: Cefazolin 2 gm IV X 1 60 min pre-op ordered for 5/25 @ 0500.   TBW = 95.5 kg      No data recorded.  No results for input(s): WBC, CREATININE, LATICACIDVEN, VANCOTROUGH, VANCOPEAK, VANCORANDOM, GENTTROUGH, GENTPEAK, GENTRANDOM, TOBRATROUGH, TOBRAPEAK, TOBRARND, AMIKACINPEAK, AMIKACINTROU, AMIKACIN in the last 168 hours.  Estimated Creatinine Clearance: 52.4 mL/min (A) (by C-G formula based on SCr of 1.68 mg/dL (H)).    No Known Allergies  Antimicrobials this admission:   >>    >>   Dose adjustments this admission:   Microbiology results:  BCx:   UCx:    Sputum:    MRSA PCR:   Thank you for allowing pharmacy to be a part of this patient's care.  Shawn Taylor D 04/16/2021 11:02 PM

## 2021-04-17 ENCOUNTER — Inpatient Hospital Stay: Payer: Medicaid Other

## 2021-04-17 ENCOUNTER — Inpatient Hospital Stay: Payer: Medicaid Other | Admitting: Urgent Care

## 2021-04-17 ENCOUNTER — Other Ambulatory Visit: Payer: Self-pay

## 2021-04-17 ENCOUNTER — Encounter: Payer: Self-pay | Admitting: Neurosurgery

## 2021-04-17 ENCOUNTER — Encounter
Admission: RE | Disposition: A | Discharge status: Home / Self Care | Payer: Self-pay | Source: Home / Self Care | Attending: Neurosurgery

## 2021-04-17 ENCOUNTER — Inpatient Hospital Stay
Admission: RE | Admit: 2021-04-17 | Discharge: 2021-04-18 | DRG: 472 | Disposition: A | Discharge status: Home / Self Care | Payer: Medicaid Other | Attending: Neurosurgery | Admitting: Neurosurgery

## 2021-04-17 DIAGNOSIS — Z419 Encounter for procedure for purposes other than remedying health state, unspecified: Secondary | ICD-10-CM

## 2021-04-17 DIAGNOSIS — Z86718 Personal history of other venous thrombosis and embolism: Secondary | ICD-10-CM

## 2021-04-17 DIAGNOSIS — G959 Disease of spinal cord, unspecified: Secondary | ICD-10-CM | POA: Diagnosis not present

## 2021-04-17 DIAGNOSIS — I739 Peripheral vascular disease, unspecified: Secondary | ICD-10-CM | POA: Diagnosis present

## 2021-04-17 DIAGNOSIS — Z7901 Long term (current) use of anticoagulants: Secondary | ICD-10-CM

## 2021-04-17 DIAGNOSIS — E785 Hyperlipidemia, unspecified: Secondary | ICD-10-CM | POA: Diagnosis present

## 2021-04-17 DIAGNOSIS — M4802 Spinal stenosis, cervical region: Secondary | ICD-10-CM | POA: Diagnosis present

## 2021-04-17 DIAGNOSIS — I255 Ischemic cardiomyopathy: Secondary | ICD-10-CM | POA: Diagnosis present

## 2021-04-17 DIAGNOSIS — Z79899 Other long term (current) drug therapy: Secondary | ICD-10-CM | POA: Diagnosis not present

## 2021-04-17 DIAGNOSIS — I70219 Atherosclerosis of native arteries of extremities with intermittent claudication, unspecified extremity: Secondary | ICD-10-CM | POA: Diagnosis present

## 2021-04-17 DIAGNOSIS — I5022 Chronic systolic (congestive) heart failure: Secondary | ICD-10-CM | POA: Diagnosis present

## 2021-04-17 DIAGNOSIS — F1010 Alcohol abuse, uncomplicated: Secondary | ICD-10-CM | POA: Diagnosis present

## 2021-04-17 DIAGNOSIS — F17210 Nicotine dependence, cigarettes, uncomplicated: Secondary | ICD-10-CM | POA: Diagnosis present

## 2021-04-17 DIAGNOSIS — M4712 Other spondylosis with myelopathy, cervical region: Secondary | ICD-10-CM | POA: Diagnosis present

## 2021-04-17 DIAGNOSIS — I70213 Atherosclerosis of native arteries of extremities with intermittent claudication, bilateral legs: Secondary | ICD-10-CM

## 2021-04-17 DIAGNOSIS — I4892 Unspecified atrial flutter: Secondary | ICD-10-CM

## 2021-04-17 DIAGNOSIS — F1910 Other psychoactive substance abuse, uncomplicated: Secondary | ICD-10-CM | POA: Diagnosis present

## 2021-04-17 DIAGNOSIS — I10 Essential (primary) hypertension: Secondary | ICD-10-CM | POA: Diagnosis present

## 2021-04-17 DIAGNOSIS — I110 Hypertensive heart disease with heart failure: Secondary | ICD-10-CM | POA: Diagnosis present

## 2021-04-17 DIAGNOSIS — I2510 Atherosclerotic heart disease of native coronary artery without angina pectoris: Secondary | ICD-10-CM | POA: Diagnosis present

## 2021-04-17 DIAGNOSIS — Z20822 Contact with and (suspected) exposure to covid-19: Secondary | ICD-10-CM | POA: Diagnosis present

## 2021-04-17 DIAGNOSIS — Z7982 Long term (current) use of aspirin: Secondary | ICD-10-CM | POA: Diagnosis not present

## 2021-04-17 DIAGNOSIS — I252 Old myocardial infarction: Secondary | ICD-10-CM

## 2021-04-17 HISTORY — DX: Other forms of dyspnea: R06.09

## 2021-04-17 HISTORY — DX: Disease of spinal cord, unspecified: G95.9

## 2021-04-17 HISTORY — DX: Dyspnea, unspecified: R06.00

## 2021-04-17 HISTORY — DX: Atherosclerotic heart disease of native coronary artery without angina pectoris: I25.10

## 2021-04-17 HISTORY — PX: ANTERIOR CERVICAL CORPECTOMY: SHX1159

## 2021-04-17 HISTORY — DX: Hyperlipidemia, unspecified: E78.5

## 2021-04-17 HISTORY — DX: Other psychoactive substance abuse, uncomplicated: F19.10

## 2021-04-17 HISTORY — PX: ANTERIOR CERVICAL DECOMP/DISCECTOMY FUSION: SHX1161

## 2021-04-17 HISTORY — DX: Peripheral vascular disease, unspecified: I73.9

## 2021-04-17 LAB — COMPREHENSIVE METABOLIC PANEL
ALT: 19 U/L (ref 0–44)
AST: 21 U/L (ref 15–41)
Albumin: 4.2 g/dL (ref 3.5–5.0)
Alkaline Phosphatase: 149 U/L — ABNORMAL HIGH (ref 38–126)
Anion gap: 11 (ref 5–15)
BUN: 25 mg/dL — ABNORMAL HIGH (ref 8–23)
CO2: 19 mmol/L — ABNORMAL LOW (ref 22–32)
Calcium: 9 mg/dL (ref 8.9–10.3)
Chloride: 106 mmol/L (ref 98–111)
Creatinine, Ser: 1.28 mg/dL — ABNORMAL HIGH (ref 0.61–1.24)
GFR, Estimated: >60 mL/min (ref >60)
Glucose, Bld: 153 mg/dL — ABNORMAL HIGH (ref 70–99)
Potassium: 4.5 mmol/L (ref 3.5–5.1)
Sodium: 136 mmol/L (ref 135–145)
Total Bilirubin: 0.5 mg/dL (ref 0.3–1.2)
Total Protein: 8.2 g/dL — ABNORMAL HIGH (ref 6.5–8.1)

## 2021-04-17 LAB — URINE DRUG SCREEN, QUALITATIVE (ARMC ONLY)
Amphetamines, Ur Screen: NOT DETECTED
Barbiturates, Ur Screen: NOT DETECTED
Benzodiazepine, Ur Scrn: NOT DETECTED
Cannabinoid 50 Ng, Ur ~~LOC~~: NOT DETECTED
Cocaine Metabolite,Ur ~~LOC~~: NOT DETECTED
MDMA (Ecstasy)Ur Screen: NOT DETECTED
Methadone Scn, Ur: NOT DETECTED
Opiate, Ur Screen: NOT DETECTED
Phencyclidine (PCP) Ur S: NOT DETECTED
Tricyclic, Ur Screen: NOT DETECTED

## 2021-04-17 LAB — MAGNESIUM: Magnesium: 1.8 mg/dL (ref 1.7–2.4)

## 2021-04-17 LAB — CBC
HCT: 42.4 % (ref 39.0–52.0)
Hemoglobin: 13.4 g/dL (ref 13.0–17.0)
MCH: 29 pg (ref 26.0–34.0)
MCHC: 31.6 g/dL (ref 30.0–36.0)
MCV: 91.8 fL (ref 80.0–100.0)
Platelets: 300 10*3/uL (ref 150–400)
RBC: 4.62 MIL/uL (ref 4.22–5.81)
RDW: 13.9 % (ref 11.5–15.5)
WBC: 15.2 10*3/uL — ABNORMAL HIGH (ref 4.0–10.5)
nRBC: 0 % (ref 0.0–0.2)

## 2021-04-17 LAB — ABO/RH: ABO/RH(D): B POS

## 2021-04-17 LAB — PHOSPHORUS: Phosphorus: 3.2 mg/dL (ref 2.5–4.6)

## 2021-04-17 SURGERY — ANTERIOR CERVICAL CORPECTOMY
Anesthesia: General

## 2021-04-17 MED ORDER — THROMBIN (RECOMBINANT) 5000 UNITS EX SOLR
CUTANEOUS | Status: DC | PRN
  Administered 2021-04-17: 5000 [IU] via TOPICAL

## 2021-04-17 MED ORDER — FOLIC ACID 1 MG PO TABS: 1.0000 mg | ORAL_TABLET | Freq: Every day | ORAL | Status: DC

## 2021-04-17 MED ORDER — FENTANYL CITRATE (PF) 100 MCG/2ML IJ SOLN
INTRAMUSCULAR | Status: AC
  Filled 2021-04-17: qty 2

## 2021-04-17 MED ORDER — GABAPENTIN 300 MG PO CAPS
300.0000 mg | ORAL_CAPSULE | Freq: Every day | ORAL | Status: DC
  Administered 2021-04-17: 300 mg via ORAL
  Filled 2021-04-17: qty 1

## 2021-04-17 MED ORDER — LORAZEPAM 1 MG PO TABS: 1.0000 mg | ORAL_TABLET | ORAL | Status: DC | PRN

## 2021-04-17 MED ORDER — ISOSORBIDE MONONITRATE ER 30 MG PO TB24
30.0000 mg | ORAL_TABLET | ORAL | Status: DC
  Administered 2021-04-18: 30 mg via ORAL
  Filled 2021-04-17: qty 1

## 2021-04-17 MED ORDER — PROPOFOL 10 MG/ML IV BOLUS
INTRAVENOUS | Status: DC | PRN
  Administered 2021-04-17: 50 mg via INTRAVENOUS
  Administered 2021-04-17: 150 mg via INTRAVENOUS
  Administered 2021-04-17: 50 mg via INTRAVENOUS

## 2021-04-17 MED ORDER — MENTHOL 3 MG MT LOZG
1.0000 | LOZENGE | OROMUCOSAL | Status: DC | PRN
  Filled 2021-04-17: qty 9

## 2021-04-17 MED ORDER — REMIFENTANIL HCL 1 MG IV SOLR
INTRAVENOUS | Status: AC
  Filled 2021-04-17: qty 1000

## 2021-04-17 MED ORDER — ACETAMINOPHEN 650 MG RE SUPP: 650.0000 mg | RECTAL | Status: DC | PRN

## 2021-04-17 MED ORDER — GLYCOPYRROLATE 0.2 MG/ML IJ SOLN
INTRAMUSCULAR | Status: DC | PRN
  Administered 2021-04-17: .2 mg via INTRAVENOUS

## 2021-04-17 MED ORDER — SODIUM CHLORIDE 0.9% FLUSH
3.0000 mL | Freq: Two times a day (BID) | INTRAVENOUS | Status: DC
  Administered 2021-04-17 – 2021-04-18 (×3): 3 mL via INTRAVENOUS

## 2021-04-17 MED ORDER — FENTANYL CITRATE (PF) 100 MCG/2ML IJ SOLN
INTRAMUSCULAR | Status: DC | PRN
  Administered 2021-04-17 (×2): 50 ug via INTRAVENOUS

## 2021-04-17 MED ORDER — CEFAZOLIN SODIUM-DEXTROSE 2-4 GM/100ML-% IV SOLN
INTRAVENOUS | Status: AC
  Filled 2021-04-17: qty 100

## 2021-04-17 MED ORDER — CHLORHEXIDINE GLUCONATE 0.12 % MT SOLN
OROMUCOSAL | Status: AC
  Filled 2021-04-17: qty 15

## 2021-04-17 MED ORDER — METHOCARBAMOL 500 MG PO TABS
500.0000 mg | ORAL_TABLET | Freq: Four times a day (QID) | ORAL | Status: DC | PRN
  Filled 2021-04-17: qty 1

## 2021-04-17 MED ORDER — LORAZEPAM 2 MG/ML IJ SOLN: 1.0000 mg | INTRAMUSCULAR | Status: DC | PRN

## 2021-04-17 MED ORDER — MIDAZOLAM HCL 2 MG/2ML IJ SOLN
INTRAMUSCULAR | Status: AC
  Filled 2021-04-17: qty 2

## 2021-04-17 MED ORDER — PROPOFOL 500 MG/50ML IV EMUL
INTRAVENOUS | Status: DC | PRN
  Administered 2021-04-17: 150 ug/kg/min via INTRAVENOUS

## 2021-04-17 MED ORDER — EPHEDRINE SULFATE 50 MG/ML IJ SOLN
INTRAMUSCULAR | Status: DC | PRN
  Administered 2021-04-17: 10 mg via INTRAVENOUS

## 2021-04-17 MED ORDER — VASOPRESSIN 20 UNIT/ML IV SOLN
INTRAVENOUS | Status: DC | PRN
  Administered 2021-04-17 (×9): 2 [IU] via INTRAVENOUS

## 2021-04-17 MED ORDER — THIAMINE HCL 100 MG/ML IJ SOLN: 100.0000 mg | Freq: Every day | INTRAMUSCULAR | Status: DC

## 2021-04-17 MED ORDER — SPIRONOLACTONE 25 MG PO TABS
25.0000 mg | ORAL_TABLET | ORAL | Status: DC
  Administered 2021-04-18: 25 mg via ORAL
  Filled 2021-04-17: qty 1

## 2021-04-17 MED ORDER — LIDOCAINE HCL (CARDIAC) PF 100 MG/5ML IV SOSY
PREFILLED_SYRINGE | INTRAVENOUS | Status: DC | PRN
  Administered 2021-04-17: 100 mg via INTRAVENOUS

## 2021-04-17 MED ORDER — SACUBITRIL-VALSARTAN 49-51 MG PO TABS
1.0000 | ORAL_TABLET | Freq: Two times a day (BID) | ORAL | Status: DC
  Administered 2021-04-18: 1 via ORAL
  Filled 2021-04-17 (×2): qty 1

## 2021-04-17 MED ORDER — MEPERIDINE HCL 25 MG/ML IJ SOLN: 6.2500 mg | INTRAMUSCULAR | Status: DC | PRN

## 2021-04-17 MED ORDER — ONDANSETRON HCL 4 MG/2ML IJ SOLN: 4.0000 mg | Freq: Four times a day (QID) | INTRAMUSCULAR | Status: DC | PRN

## 2021-04-17 MED ORDER — PHENOL 1.4 % MT LIQD
1.0000 | OROMUCOSAL | Status: DC | PRN
  Filled 2021-04-17: qty 177

## 2021-04-17 MED ORDER — BUPIVACAINE-EPINEPHRINE (PF) 0.5% -1:200000 IJ SOLN
INTRAMUSCULAR | Status: DC | PRN
  Administered 2021-04-17: 7 mL

## 2021-04-17 MED ORDER — OXYCODONE HCL 5 MG PO TABS: 5.0000 mg | ORAL_TABLET | ORAL | Status: DC | PRN

## 2021-04-17 MED ORDER — NICOTINE 7 MG/24HR TD PT24
7.0000 mg | MEDICATED_PATCH | Freq: Every day | TRANSDERMAL | Status: DC
  Administered 2021-04-17 – 2021-04-18 (×2): 7 mg via TRANSDERMAL
  Filled 2021-04-17 (×2): qty 1

## 2021-04-17 MED ORDER — SODIUM CHLORIDE 0.9 % IV SOLN
INTRAVENOUS | Status: DC | PRN
  Administered 2021-04-17: 20 ug/min via INTRAVENOUS

## 2021-04-17 MED ORDER — ONDANSETRON HCL 4 MG PO TABS: 4.0000 mg | ORAL_TABLET | Freq: Four times a day (QID) | ORAL | Status: DC | PRN

## 2021-04-17 MED ORDER — FAMOTIDINE 20 MG PO TABS
ORAL_TABLET | ORAL | Status: AC
  Filled 2021-04-17: qty 1

## 2021-04-17 MED ORDER — DAPAGLIFLOZIN PROPANEDIOL 5 MG PO TABS
10.0000 mg | ORAL_TABLET | Freq: Every day | ORAL | Status: DC
  Administered 2021-04-17 – 2021-04-18 (×2): 10 mg via ORAL
  Filled 2021-04-17 (×2): qty 2

## 2021-04-17 MED ORDER — ONDANSETRON HCL 4 MG/2ML IJ SOLN: 4.0000 mg | Freq: Once | INTRAMUSCULAR | Status: DC | PRN

## 2021-04-17 MED ORDER — DEXAMETHASONE SODIUM PHOSPHATE 10 MG/ML IJ SOLN
INTRAMUSCULAR | Status: DC | PRN
  Administered 2021-04-17: 10 mg via INTRAVENOUS

## 2021-04-17 MED ORDER — THIAMINE HCL 100 MG PO TABS
100.0000 mg | ORAL_TABLET | Freq: Every day | ORAL | Status: DC
  Administered 2021-04-17 – 2021-04-18 (×2): 100 mg via ORAL
  Filled 2021-04-17 (×2): qty 1

## 2021-04-17 MED ORDER — SODIUM CHLORIDE 0.9% FLUSH: 3.0000 mL | INTRAVENOUS | Status: DC | PRN

## 2021-04-17 MED ORDER — POTASSIUM CHLORIDE IN NACL 20-0.9 MEQ/L-% IV SOLN
INTRAVENOUS | Status: DC
  Filled 2021-04-17 (×4): qty 1000

## 2021-04-17 MED ORDER — ENOXAPARIN SODIUM 40 MG/0.4ML IJ SOSY
40.0000 mg | PREFILLED_SYRINGE | INTRAMUSCULAR | Status: DC
  Administered 2021-04-18: 40 mg via SUBCUTANEOUS
  Filled 2021-04-17: qty 0.4

## 2021-04-17 MED ORDER — SODIUM CHLORIDE 0.9 % IV SOLN: 250.0000 mL | INTRAVENOUS | Status: DC

## 2021-04-17 MED ORDER — SENNOSIDES-DOCUSATE SODIUM 8.6-50 MG PO TABS: 1.0000 | ORAL_TABLET | Freq: Every evening | ORAL | Status: DC | PRN

## 2021-04-17 MED ORDER — ACETAMINOPHEN 500 MG PO TABS
1000.0000 mg | ORAL_TABLET | Freq: Four times a day (QID) | ORAL | Status: AC
  Administered 2021-04-17 – 2021-04-18 (×4): 1000 mg via ORAL
  Filled 2021-04-17 (×4): qty 2

## 2021-04-17 MED ORDER — SUCCINYLCHOLINE CHLORIDE 20 MG/ML IJ SOLN
INTRAMUSCULAR | Status: DC | PRN
  Administered 2021-04-17: 140 mg via INTRAVENOUS

## 2021-04-17 MED ORDER — CEFAZOLIN SODIUM-DEXTROSE 2-4 GM/100ML-% IV SOLN
2.0000 g | Freq: Three times a day (TID) | INTRAVENOUS | Status: AC
  Administered 2021-04-17: 2 g via INTRAVENOUS
  Filled 2021-04-17 (×2): qty 100

## 2021-04-17 MED ORDER — REMIFENTANIL HCL 1 MG IV SOLR
INTRAVENOUS | Status: DC | PRN
  Administered 2021-04-17: .15 ug/kg/min via INTRAVENOUS

## 2021-04-17 MED ORDER — METHOCARBAMOL 1000 MG/10ML IJ SOLN
500.0000 mg | Freq: Four times a day (QID) | INTRAMUSCULAR | Status: DC | PRN
  Administered 2021-04-17: 500 mg via INTRAVENOUS
  Filled 2021-04-17 (×3): qty 5

## 2021-04-17 MED ORDER — CELECOXIB 200 MG PO CAPS
200.0000 mg | ORAL_CAPSULE | Freq: Two times a day (BID) | ORAL | Status: DC
  Administered 2021-04-17 – 2021-04-18 (×2): 200 mg via ORAL
  Filled 2021-04-17 (×2): qty 1

## 2021-04-17 MED ORDER — HYDRALAZINE HCL 25 MG PO TABS
25.0000 mg | ORAL_TABLET | Freq: Three times a day (TID) | ORAL | Status: DC
  Administered 2021-04-17 – 2021-04-18 (×4): 25 mg via ORAL
  Filled 2021-04-17 (×3): qty 1

## 2021-04-17 MED ORDER — ADULT MULTIVITAMIN W/MINERALS CH
1.0000 | ORAL_TABLET | Freq: Every day | ORAL | Status: DC
  Administered 2021-04-17 – 2021-04-18 (×2): 1 via ORAL
  Filled 2021-04-17 (×2): qty 1

## 2021-04-17 MED ORDER — FUROSEMIDE 20 MG PO TABS
20.0000 mg | ORAL_TABLET | ORAL | Status: DC
  Administered 2021-04-18: 20 mg via ORAL
  Filled 2021-04-17: qty 1

## 2021-04-17 MED ORDER — FOLIC ACID 1 MG PO TABS
1.0000 mg | ORAL_TABLET | Freq: Every day | ORAL | Status: DC
  Administered 2021-04-17 – 2021-04-18 (×2): 1 mg via ORAL
  Filled 2021-04-17 (×2): qty 1

## 2021-04-17 MED ORDER — LORAZEPAM 2 MG PO TABS
0.0000 mg | ORAL_TABLET | Freq: Four times a day (QID) | ORAL | Status: DC
  Administered 2021-04-18: 4 mg via ORAL
  Filled 2021-04-17: qty 2

## 2021-04-17 MED ORDER — ONDANSETRON HCL 4 MG/2ML IJ SOLN
INTRAMUSCULAR | Status: DC | PRN
  Administered 2021-04-17: 4 mg via INTRAVENOUS

## 2021-04-17 MED ORDER — THIAMINE HCL 100 MG PO TABS: 100.0000 mg | ORAL_TABLET | Freq: Every day | ORAL | Status: DC

## 2021-04-17 MED ORDER — LORAZEPAM 2 MG PO TABS: 0.0000 mg | ORAL_TABLET | Freq: Two times a day (BID) | ORAL | Status: DC

## 2021-04-17 MED ORDER — METOPROLOL SUCCINATE ER 25 MG PO TB24
25.0000 mg | ORAL_TABLET | ORAL | Status: DC
  Administered 2021-04-18: 25 mg via ORAL
  Filled 2021-04-17: qty 1

## 2021-04-17 MED ORDER — ACETAMINOPHEN 325 MG PO TABS: 650.0000 mg | ORAL_TABLET | ORAL | Status: DC | PRN

## 2021-04-17 MED ORDER — FENTANYL CITRATE (PF) 100 MCG/2ML IJ SOLN
25.0000 ug | INTRAMUSCULAR | Status: DC | PRN
  Administered 2021-04-17 (×2): 50 ug via INTRAVENOUS

## 2021-04-17 MED ORDER — MIDAZOLAM HCL 2 MG/2ML IJ SOLN
INTRAMUSCULAR | Status: DC | PRN
  Administered 2021-04-17 (×2): 1 mg via INTRAVENOUS

## 2021-04-17 MED ORDER — ATORVASTATIN CALCIUM 20 MG PO TABS
40.0000 mg | ORAL_TABLET | Freq: Every day | ORAL | Status: DC
  Administered 2021-04-17: 40 mg via ORAL
  Filled 2021-04-17: qty 2

## 2021-04-17 MED ORDER — MEPERIDINE HCL 25 MG/ML IJ SOLN
INTRAMUSCULAR | Status: AC
  Filled 2021-04-17: qty 1

## 2021-04-17 MED ORDER — OXYCODONE HCL 5 MG PO TABS
10.0000 mg | ORAL_TABLET | ORAL | Status: DC | PRN
  Administered 2021-04-17 – 2021-04-18 (×3): 10 mg via ORAL
  Filled 2021-04-17 (×3): qty 2

## 2021-04-17 MED ORDER — PROPOFOL 1000 MG/100ML IV EMUL
INTRAVENOUS | Status: AC
  Filled 2021-04-17: qty 100

## 2021-04-17 MED ORDER — PHENYLEPHRINE HCL (PRESSORS) 10 MG/ML IV SOLN
INTRAVENOUS | Status: DC | PRN
  Administered 2021-04-17 (×4): 100 ug via INTRAVENOUS
  Administered 2021-04-17: 200 ug via INTRAVENOUS
  Administered 2021-04-17: 100 ug via INTRAVENOUS
  Administered 2021-04-17: 200 ug via INTRAVENOUS

## 2021-04-17 MED ORDER — LACTATED RINGERS IV SOLN: INTRAVENOUS | Status: DC | PRN

## 2021-04-17 MED ORDER — MEPERIDINE HCL 25 MG/ML IJ SOLN
12.5000 mg | Freq: Once | INTRAMUSCULAR | Status: AC
  Administered 2021-04-17: 12.5 mg via INTRAVENOUS

## 2021-04-17 SURGICAL SUPPLY — 66 items
BASKET BONE COLLECTION (BASKET) ×2 IMPLANT
BULB RESERV EVAC DRAIN JP 100C (MISCELLANEOUS) ×2 IMPLANT
BUR NEURO DRILL SOFT 3.0X3.8M (BURR) ×2 IMPLANT
CHLORAPREP W/TINT 26 (MISCELLANEOUS) ×4 IMPLANT
COUNTER NEEDLE 20/40 LG (NEEDLE) ×2 IMPLANT
COVER WAND RF STERILE (DRAPES) ×2 IMPLANT
CUP MEDICINE 2OZ PLAST GRAD ST (MISCELLANEOUS) ×2 IMPLANT
DERMABOND ADVANCED (GAUZE/BANDAGES/DRESSINGS) ×1
DERMABOND ADVANCED .7 DNX12 (GAUZE/BANDAGES/DRESSINGS) ×1 IMPLANT
DRAIN CHANNEL JP 10F RND 20C F (MISCELLANEOUS) IMPLANT
DRAIN JP 10F RND SILICONE (MISCELLANEOUS) ×2 IMPLANT
DRAPE C ARM PK CFD 31 SPINE (DRAPES) ×2 IMPLANT
DRAPE LAPAROTOMY 77X122 PED (DRAPES) ×2 IMPLANT
DRAPE MICROSCOPE SPINE 48X150 (DRAPES) ×2 IMPLANT
DRAPE SURG 17X11 SM STRL (DRAPES) ×8 IMPLANT
DRSG OPSITE POSTOP 4X6 (GAUZE/BANDAGES/DRESSINGS) ×2 IMPLANT
ELECT CAUTERY BLADE TIP 2.5 (TIP) ×2
ELECT REM PT RETURN 9FT ADLT (ELECTROSURGICAL) ×2
ELECTRODE CAUTERY BLDE TIP 2.5 (TIP) ×1 IMPLANT
ELECTRODE REM PT RTRN 9FT ADLT (ELECTROSURGICAL) ×1 IMPLANT
FEE INTRAOP CADWELL SUPPLY NCS (MISCELLANEOUS) ×1 IMPLANT
FEE INTRAOP MONITOR IMPULS NCS (MISCELLANEOUS) ×1 IMPLANT
GLOVE SURG SYN 8.5  E (GLOVE) ×3
GLOVE SURG SYN 8.5 E (GLOVE) ×3 IMPLANT
GOWN SRG XL LVL 3 NONREINFORCE (GOWNS) ×1 IMPLANT
GOWN STRL NON-REIN TWL XL LVL3 (GOWNS) ×1
GOWN STRL REUS W/ TWL LRG LVL3 (GOWN DISPOSABLE) ×1 IMPLANT
GOWN STRL REUS W/ TWL XL LVL3 (GOWN DISPOSABLE) ×1 IMPLANT
GOWN STRL REUS W/TWL LRG LVL3 (GOWN DISPOSABLE) ×1
GOWN STRL REUS W/TWL XL LVL3 (GOWN DISPOSABLE) ×1
GRADUATE 1200CC STRL 31836 (MISCELLANEOUS) ×2 IMPLANT
INTRAOP CADWELL SUPPLY FEE NCS (MISCELLANEOUS) ×1
INTRAOP DISP SUPPLY FEE NCS (MISCELLANEOUS) ×1
INTRAOP MONITOR FEE IMPULS NCS (MISCELLANEOUS) ×1
INTRAOP MONITOR FEE IMPULSE (MISCELLANEOUS) ×1
KIT TURNOVER KIT A (KITS) ×2 IMPLANT
MANIFOLD NEPTUNE II (INSTRUMENTS) ×2 IMPLANT
MARKER SKIN DUAL TIP RULER LAB (MISCELLANEOUS) ×4 IMPLANT
NDL SAFETY ECLIPSE 18X1.5 (NEEDLE) ×1 IMPLANT
NEEDLE HYPO 18GX1.5 SHARP (NEEDLE) ×1
NEEDLE HYPO 22GX1.5 SAFETY (NEEDLE) ×2 IMPLANT
NS IRRIG 1000ML POUR BTL (IV SOLUTION) ×2 IMPLANT
PACK LAMINECTOMY NEURO (CUSTOM PROCEDURE TRAY) ×2 IMPLANT
PAD ARMBOARD 7.5X6 YLW CONV (MISCELLANEOUS) ×2 IMPLANT
PIN CASPAR 14 (PIN) ×1 IMPLANT
PIN CASPAR 14MM (PIN) ×2
PLATE ANT CERV XTEND 3 LV 42 (Plate) ×2 IMPLANT
SCREW VAR 4.2 XD SELF DRILL 16 (Screw) ×4 IMPLANT
SCREW VAR SELF DRILL XD 18X4.2 (Screw) ×3 IMPLANT
SCREW VAR SELF DRILL XD 20X4.2 (Screw) ×2 IMPLANT
SCREW VAR SELF DRILL XD 4.2 (Screw) ×5 IMPLANT
SPACER C HEDRON 12X14 7M 7D (Spacer) ×2 IMPLANT
SPACER CERV TI 12X14X12 0D (Spacer) ×2 IMPLANT
SPACER CERV TI 12X14X12 7D (Spacer) ×2 IMPLANT
SPACER CERV TI FORT 18-23X12 (Spacer) ×2 IMPLANT
SPOGE SURGIFLO 8M (HEMOSTASIS) ×1
SPONGE KITTNER 5P (MISCELLANEOUS) ×2 IMPLANT
SPONGE SURGIFLO 8M (HEMOSTASIS) ×1 IMPLANT
STAPLER SKIN PROX 35W (STAPLE) IMPLANT
SUT V-LOC 90 ABS DVC 3-0 CL (SUTURE) ×2 IMPLANT
SUT VIC AB 3-0 SH 8-18 (SUTURE) ×2 IMPLANT
SYR 30ML LL (SYRINGE) ×2 IMPLANT
TAPE CLOTH 3X10 WHT NS LF (GAUZE/BANDAGES/DRESSINGS) ×4 IMPLANT
TOWEL OR 17X26 4PK STRL BLUE (TOWEL DISPOSABLE) ×6 IMPLANT
TRAY FOLEY MTR SLVR 16FR STAT (SET/KITS/TRAYS/PACK) IMPLANT
TUBING CONNECTING 10 (TUBING) ×2 IMPLANT

## 2021-04-17 NOTE — Consult Note (Signed)
[facility] Medical Consultation  Shawn Taylor QQP:[id number] DOB: 1956/04/06 DOA: 04/17/2021 PCP: Preston Fleeting, MD   Requesting physician: Dr Myer Haff Date of consultation: 04/17/21 Reason for consultation: Management of hypertension/CHF  Impression/Recommendations Active Problems:   Atrial flutter (HCC)   Hypertension   Atherosclerosis of native arteries of extremity with intermittent claudication (HCC)   Cervical myelopathy (HCC)   Polysubstance abuse (HCC)   Chronic systolic CHF (congestive heart failure) (HCC)    1. History of atrial flutter Continue metoprolol for rate control Apixaban held for planned procedure Will be resumed by neurosurgery when appropriate   2.  Chronic systolic heart failure Patient's last known LVEF is less than 20% Continue spironolactone, Entresto, Lasix and [place]. We will place patient on cardiac monitor due to increased risk for sudden cardiac death   3.  Hypertension Patient is normotensive Continue metoprolol, nitrates and hydralazine as much as blood pressure tolerates   4.  History of alcohol abuse Continue thiamine, folic acid and multivitamin Will place on CIWA protocol and administer for CIWA score of 8 or greater    5.  Coronary artery disease Continue nitrates and statins Aspirin is on hold due to planned procedure  I will followup again tomorrow. Please contact me if I can be of assistance in the meanwhile. Thank you for this consultation.  Chief Complaint: Neck pain                               Left arm pain  HPI: Patient is a 65 year old African-American male with multiple medical problems which include coronary artery disease, ischemic cardiomyopathy, atrial flutter on chronic anticoagulation therapy, history of DVT who was admitted to the neurosurgery service and is status post anterior cervical corpectomy of C4, anterior cervical discectomy and fusion C5-C6, anterior arthrodesis from C3-C6 for  cervical myelopathy. Medical consult was requested for management of patient's medical problems. He complains of pain in his neck from recent surgery as well as itching in his feet. He denies having any chest pain, no shortness of breath, no nausea, no vomiting, no abdominal pain, no headache, no dizziness, no lightheadedness, no changes in his bowel habits, no fever, no chills, no cough.   Review of Systems:  As per HPI otherwise all other systems reviewed and negative.   Past Medical History:  Diagnosis Date  . Atrial flutter (HCC)   . CAD (coronary artery disease)   . Cardiac arrest (HCC) 02/12/2020   WITH HIS MI  . Carotid artery occlusion   . Cervical myelopathy (HCC)   . CHF (congestive heart failure) (HCC)   . DOE (dyspnea on exertion)   . DVT (deep venous thrombosis) (HCC)   . HCV (hepatitis C virus) 2019   Tx'd with Harvoni course  . HLD (hyperlipidemia)   . Hypertension   . Migraines   . Myocardial infarction (HCC) 02/12/2020  . PAD (peripheral artery disease) (HCC)   . Polysubstance abuse (HCC)    Cocaine and ETOH  . Thrombocytopenia (HCC)    Past Surgical History:  Procedure Laterality Date  . LOWER EXTREMITY ANGIOGRAPHY Right 01/29/2021   Procedure: LOWER EXTREMITY ANGIOGRAPHY;  Surgeon: Renford Dills, MD;  Location: [facility] INVASIVE CV LAB;  Service: Cardiovascular;  Laterality: Right;   Social History:  reports that he has been smoking cigarettes. He has a 13.75 pack-year smoking history. He has never used smokeless tobacco. He reports previous alcohol use of about 2.0 -  3.0 standard drinks of alcohol per week. He reports previous drug use. Frequency: 1.00 time per week. Drug: Cocaine.  No Known Allergies Family History  Problem Relation Age of Onset  . Diabetes Sister     Prior to Admission medications   Medication Sig Start Date End Date Taking? Authorizing Provider  apixaban (ELIQUIS) 5 MG TABS tablet Take 1 tablet (5 mg total) by mouth 2 (two) times  daily. Patient taking differently: Take 5 mg by mouth every 12 (twelve) hours. 03/07/20  Yes Lurene Shadow, MD  aspirin 81 MG EC tablet Take 81 mg by mouth daily.   Yes [provider]  atorvastatin (LIPITOR) 40 MG tablet Take 1 tablet (40 mg total) by mouth daily at 10 pm. 03/07/20  Yes Lurene Shadow, MD  dapagliflozin propanediol (FARXIGA) 10 MG TABS tablet Take 10 mg by mouth daily. TAKING FOR HIS HEART   Yes [provider]  folic acid (FOLVITE) 1 MG tablet Take 1 mg by mouth daily with lunch. 07/13/20 07/13/21 Yes [provider]  furosemide (LASIX) 40 MG tablet Take 20 mg by mouth every morning.   Yes [provider]  gabapentin (NEURONTIN) 300 MG capsule TAKE ONE CAPSULE BY MOUTH AT BEDTIME FOR PAIN Patient taking differently: Take 300 mg by mouth at bedtime. 04/16/20 04/16/21 Yes Revelo, Presley Raddle, MD  hydrALAZINE (APRESOLINE) 25 MG tablet Take 25 mg by mouth 3 (three) times daily.   Yes [provider]  isosorbide mononitrate (IMDUR) 30 MG 24 hr tablet Take 30 mg by mouth every morning.   Yes [provider]  metoprolol succinate (TOPROL-XL) 25 MG 24 hr tablet Take 25 mg by mouth every morning.   Yes Rockey Situ, NP  sacubitril-valsartan (ENTRESTO) 49-51 MG Take 1 tablet by mouth 2 (two) times daily. 09/04/20  Yes Hackney, Inetta Fermo A, FNP  spironolactone (ALDACTONE) 25 MG tablet Take 0.5 tablets (12.5 mg total) by mouth daily. Patient taking differently: Take 25 mg by mouth every morning. 03/08/20  Yes Lurene Shadow, MD  thiamine 100 MG tablet Take 1 tablet (100 mg total) by mouth daily. 03/08/20  Yes Lurene Shadow, MD   Physical Exam: Blood pressure 95/85, pulse 85, temperature (!) 97.2 F (36.2 C), resp. rate (!) 23, SpO2 95 %. Vitals:   04/17/21 1330 04/17/21 1345  BP: 106/85 95/85  Pulse: 91 85  Resp: 18 (!) 23  Temp:    SpO2: 93% 95%     General: Sitting up in bed appears comfortable and in no distress  Eyes: Pale  conjunctiva  ENT: Dressing over anterior portion of the neck  Neck: Supple, no JVD  Cardiovascular: RRR S1, S2  Respiratory: Bilateral air entry  Abdomen: Bowel sounds present, soft, nontender, nondistended  Skin: Warm and dry  Musculoskeletal: Normal range of motion  Psychiatric: Normal mood and affect  Neurologic: Able to move all extremities  Labs on Admission:  Basic Metabolic Panel: No results for input(s): NA, K, CL, CO2, GLUCOSE, BUN, CREATININE, CALCIUM, MG, PHOS in the last 168 hours. Liver Function Tests: No results for input(s): AST, ALT, ALKPHOS, BILITOT, PROT, ALBUMIN in the last 168 hours. No results for input(s): LIPASE, AMYLASE in the last 168 hours. No results for input(s): AMMONIA in the last 168 hours. CBC: No results for input(s): WBC, NEUTROABS, HGB, HCT, MCV, PLT in the last 168 hours. Cardiac Enzymes: No results for input(s): CKTOTAL, CKMB, CKMBINDEX, TROPONINI in the last 168 hours. BNP: Invalid input(s): POCBNP CBG: No results for input(s):  GLUCAP in the last 168 hours.  Radiological Exams on Admission: DG Cervical Spine 2 or 3 views  Result Date: 04/17/2021 CLINICAL DATA:  Surgery.  ACDF. EXAM: CERVICAL SPINE - 2-3 VIEW; DG C-ARM 1-60 MIN COMPARISON:  MRI cervical spine February 19, 2021. FINDINGS: Six C-arm fluoroscopic images were obtained intraoperatively and submitted for post operative interpretation. These images demonstrate postsurgical changes of anterior plate and screwscrews spanning from C3-C6 with corpectomy at C4 and intervening spacer at C5-C6. No unexpected findings. Please see the performing provider's procedural report for further detail. IMPRESSION: Intraoperative fluoroscopy, as detailed above. Electronically Signed   By: Feliberto Harts MD   On: 04/17/2021 13:20   DG C-Arm 1-60 Min  Result Date: 04/17/2021 CLINICAL DATA:  Surgery.  ACDF. EXAM: CERVICAL SPINE - 2-3 VIEW; DG C-ARM 1-60 MIN COMPARISON:  MRI cervical spine February 19, 2021. FINDINGS: Six C-arm fluoroscopic images were obtained intraoperatively and submitted for post operative interpretation. These images demonstrate postsurgical changes of anterior plate and screwscrews spanning from C3-C6 with corpectomy at C4 and intervening spacer at C5-C6. No unexpected findings. Please see the performing provider's procedural report for further detail. IMPRESSION: Intraoperative fluoroscopy, as detailed above. Electronically Signed   By: Feliberto Harts MD   On: 04/17/2021 13:20   DG C-Arm 1-60 Min  Result Date: 04/17/2021 CLINICAL DATA:  Surgery.  ACDF. EXAM: CERVICAL SPINE - 2-3 VIEW; DG C-ARM 1-60 MIN COMPARISON:  MRI cervical spine February 19, 2021. FINDINGS: Six C-arm fluoroscopic images were obtained intraoperatively and submitted for post operative interpretation. These images demonstrate postsurgical changes of anterior plate and screwscrews spanning from C3-C6 with corpectomy at C4 and intervening spacer at C5-C6. No unexpected findings. Please see the performing provider's procedural report for further detail. IMPRESSION: Intraoperative fluoroscopy, as detailed above. Electronically Signed   By: Feliberto Harts MD   On: 04/17/2021 13:20   DG C-Arm 1-60 Min  Result Date: 04/17/2021 CLINICAL DATA:  Surgery.  ACDF. EXAM: CERVICAL SPINE - 2-3 VIEW; DG C-ARM 1-60 MIN COMPARISON:  MRI cervical spine February 19, 2021. FINDINGS: Six C-arm fluoroscopic images were obtained intraoperatively and submitted for post operative interpretation. These images demonstrate postsurgical changes of anterior plate and screwscrews spanning from C3-C6 with corpectomy at C4 and intervening spacer at C5-C6. No unexpected findings. Please see the performing provider's procedural report for further detail. IMPRESSION: Intraoperative fluoroscopy, as detailed above. Electronically Signed   By: Feliberto Harts MD   On: 04/17/2021 13:20    EKG: Independently reviewed.   Time spent: 60  minutes  Shawn Taylor [facility] Pager [phone]  If 7PM-7AM, please contact night-coverage [email or web address] Password [email or web address] 04/17/2021, 2:02 PM

## 2021-04-17 NOTE — Anesthesia Preprocedure Evaluation (Addendum)
Anesthesia Evaluation  Patient identified by MRN, date of birth, ID band Patient awake    Reviewed: Allergy & Precautions, NPO status , Patient's Chart, lab work & pertinent test results  History of Anesthesia Complications Negative for: history of anesthetic complications  Airway Mallampati: III  TM Distance: >3 FB Neck ROM: Full    Dental  (+) Chipped, Poor Dentition   Pulmonary neg sleep apnea, neg COPD, Current Smoker and Patient abstained from smoking., former smoker,    Pulmonary exam normal        Cardiovascular hypertension, Pt. on medications + CAD, + Past MI, + Peripheral Vascular Disease, +CHF and + DOE  Normal cardiovascular exam     Neuro/Psych  Headaches, neg Seizures PSYCHIATRIC DISORDERS    GI/Hepatic neg GERD  ,(+)     substance abuse  alcohol use, Hepatitis -  Endo/Other  neg diabetes  Renal/GU negative Renal ROS     Musculoskeletal   Abdominal   Peds  Hematology   Anesthesia Other Findings Atrial flutter (HCC)    CHF (congestive heart failure) (HCC) Left ventricular ejection fraction, by estimation, is <20% March 2021 (felt secondary to a-fib, now corrected) Stress Test 03-04-21  Normal Lexiscan infusion EKG Normal myocardial perfusion without evidence of myocardial ischemia  LVEF= 50% Overall risk of cardiac complication with surgery and/or procedure is low, <1%  Dr. Gwen Pounds April 2022   DVT (deep venous thrombosis) (HCC Hypertension    Migraines    Thrombocytopenia (HCC)       Reproductive/Obstetrics                           Anesthesia Physical  Anesthesia Plan  ASA: III  Anesthesia Plan: General   Post-op Pain Management:    Induction: Intravenous  PONV Risk Score and Plan: 2  Airway Management Planned: Oral ETT  Additional Equipment:   Intra-op Plan:   Post-operative Plan: Extubation in OR  Informed Consent: I have reviewed the patients  History and Physical, chart, labs and discussed the procedure including the risks, benefits and alternatives for the proposed anesthesia with the patient or authorized representative who has indicated his/her understanding and acceptance.       Plan Discussed with: CRNA, Anesthesiologist and Surgeon  Anesthesia Plan Comments:        Anesthesia Quick Evaluation

## 2021-04-17 NOTE — Anesthesia Postprocedure Evaluation (Signed)
Anesthesia Post Note  Patient: [name]  Procedure(s) Performed: ANTERIOR CERVICAL CORPECTOMY C4 (N/A ) C5-6 ANTERIOR CERVICAL DECOMPRESSION/DISCECTOMY FUSION (N/A )  Patient location during evaluation: PACU Anesthesia Type: General Level of consciousness: awake and alert, awake and oriented Pain management: pain level controlled Vital Signs Assessment: post-procedure vital signs reviewed and stable Respiratory status: spontaneous breathing, nonlabored ventilation and respiratory function stable Cardiovascular status: blood pressure returned to baseline and stable Postop Assessment: no apparent nausea or vomiting Anesthetic complications: no   No complications documented.   Last Vitals:  Vitals:   04/17/21 1345 04/17/21 1400  BP: 95/85 (!) 142/67  Pulse: 85 89  Resp: (!) 23 17  Temp:  (!) 36.1 C  SpO2: 95% 93%    Last Pain:  Vitals:   04/17/21 1400  TempSrc:   PainSc: 2                  Manfred Arch

## 2021-04-17 NOTE — H&P (Signed)
History of Present Illness: 04/17/2021 Shawn Taylor continues to have symptoms and presents today for surgery.  03/06/2021  Shawn Taylor returns to see me. He has continued symptoms as noted below. He has severe symptoms of peripheral vascular disease.  01/31/2021 Shawn Taylor is here today with a chief complaint of neck and left arm pain and numbness and tingling in the left hand. Having some right shoulder pain as well.   He has been dealing with discomfort into his left arm for 4 years, but it has been getting worse. He does note that he has had some issues where his balance feels off. He has trouble with his fine motor control and has dropped items from his left hand. His left hand is nearly always numb.  Moving his neck bothers him. Nothing really makes it better.  He has significant peripheral vascular disease and should be undergoing stenting in the next few weeks.  Conservative measures:  Physical therapy: has not participated in Multimodal medical therapy including regular antiinflammatories: gabapentin Injections: has not had epidural steroid injections  Past Surgery: none  Shawn Taylor has symptoms of cervical myelopathy.  The symptoms are causing a significant impact on the patient's life.   Review of Systems:  A 10 point review of systems is negative, except for the pertinent positives and negatives detailed in the HPI.  Past Medical History: Past Medical History:  Diagnosis Date  . Acute hypoxemic respiratory failure (CMS-HCC)  . Atrial flutter (CMS-HCC)  . Cardiac arrest (CMS-HCC)  . CHF (congestive heart failure) (CMS-HCC)  . Pulmonary edema   Past Surgical History: History reviewed. No pertinent surgical history.   Current Meds  Medication Sig  . apixaban (ELIQUIS) 5 MG TABS tablet Take 1 tablet (5 mg total) by mouth 2 (two) times daily. (Patient taking differently: Take 5 mg by mouth every 12 (twelve) hours.)  . aspirin 81 MG EC tablet Take 81 mg by mouth  daily.  Marland Kitchen atorvastatin (LIPITOR) 40 MG tablet Take 1 tablet (40 mg total) by mouth daily at 10 pm.  . dapagliflozin propanediol (FARXIGA) 10 MG TABS tablet Take 10 mg by mouth daily. TAKING FOR HIS HEART  . folic acid (FOLVITE) 1 MG tablet Take 1 mg by mouth daily with lunch.  . furosemide (LASIX) 40 MG tablet Take 20 mg by mouth every morning.  . gabapentin (NEURONTIN) 300 MG capsule TAKE ONE CAPSULE BY MOUTH AT BEDTIME FOR PAIN (Patient taking differently: Take 300 mg by mouth at bedtime.)  . hydrALAZINE (APRESOLINE) 25 MG tablet Take 25 mg by mouth 3 (three) times daily.  . isosorbide mononitrate (IMDUR) 30 MG 24 hr tablet Take 30 mg by mouth every morning.  . metoprolol succinate (TOPROL-XL) 25 MG 24 hr tablet Take 25 mg by mouth every morning.  . sacubitril-valsartan (ENTRESTO) 49-51 MG Take 1 tablet by mouth 2 (two) times daily.  Marland Kitchen spironolactone (ALDACTONE) 25 MG tablet Take 0.5 tablets (12.5 mg total) by mouth daily. (Patient taking differently: Take 25 mg by mouth every morning.)  . thiamine 100 MG tablet Take 1 tablet (100 mg total) by mouth daily.    No Known Allergies   Social History: Social History   Tobacco Use  . Smoking status: Current Some Day Smoker  Types: Cigarettes  . Smokeless tobacco: Never Used  . Tobacco comment: occasionally 2 cigarettes daily  Vaping Use  . Vaping Use: Never used   Family Medical History: History reviewed. No pertinent family history.  Physical Examination:  Vitals:  04/17/21 0816  BP: 124/68  Pulse: 82  Resp: 18  Temp: (!) 97 F (36.1 C)  SpO2: 96%    Heart sounds normal no MRG. Chest Clear to Auscultation Bilaterally.  General: Patient is well developed, well nourished, calm, collected, and in no apparent distress. Attention to examination is appropriate.  Psychiatric: Patient is non-anxious.  Head: Pupils equal, round, and reactive to light.  ENT: Oral mucosa appears well hydrated.  Neck: Supple. Full range of  motion.  Respiratory: Patient is breathing without any difficulty.  Extremities: No edema.  Vascular: Palpable dorsal pedal pulses.  Skin: On exposed skin, there are no abnormal skin lesions.  NEUROLOGICAL:   Awake, alert, oriented to person, place, and time. Speech is clear and fluent. Fund of knowledge is appropriate.   Cranial Nerves: Pupils equal round and reactive to light. Facial tone is symmetric. Facial sensation is symmetric. Shoulder shrug is symmetric. Tongue protrusion is midline. There is no pronator drift.  ROM of spine: full. Palpation of spine: non tender.   Strength: Side Biceps Triceps Deltoid Interossei Grip Wrist Ext. Wrist Flex.  R 5 5 5 5 5 5 5   L 5 5 5 5 5 5 5    Side Iliopsoas Quads Hamstring PF DF EHL  R 5 5 5 5 5 5   L 5 5 5 5 5 5    Reflexes are 2+ and symmetric at the biceps, triceps, brachioradialis, 3+patella and 2+ achilles. Hoffman's is present on left. Clonus is not present. Toes are down-going.  Bilateral upper and lower extremity sensation is intact to light touch except left hand.  Gait is slightly wide-based. No evidence of dysmetria noted.  Medical Decision Making  Imaging: CT C spine 11/23/17 IMPRESSION:  1. No skull fracture or intracranial hemorrhage.  2. No cervical spine fracture or traumatic subluxation.  3. Mild diffuse cerebral and cerebellar atrophy.  4. Minimal chronic small vessel white matter ischemic changes in  both cerebral hemispheres.  5. Multilevel cervical spine degenerative changes.  6. Bilateral carotid artery atheromatous calcifications.   Electronically Signed  By: M.D.  On: 11/23/2017 16:24  MRI C spine 02/19/2021 IMPRESSION:  1. Severe spinal canal stenosis atC4-5 with severe cord impingement  and signal changes suggesting chronic myelomalacia.  2. Moderate spinal canal stenosis at C3-4.  3. Severe bilateral neural foraminal stenosis at C4-5 and C5-6.   Electronically Signed  By:  11/25/17 M.D.  On: 02/20/2021 01:07  I have personally reviewed the images and agree with the above interpretation.  Assessment and Plan: Shawn Taylor is a pleasant 65 y.o. male with cervical spondylotic myelopathy. He also has significant peripheral vascular disease.  He has severe stenosis from C3-5 particular behind the C4 vertebral body. He has severe foraminal stenosis at C4-5 and C5-6. Given his symptoms and findings, I recommended C4 corpectomy with C5-6 anterior cervical discectomy and fusion and C3-6 anterior plating.   Deatra Robinson MD, [facility] Department of Neurosurgery

## 2021-04-17 NOTE — Transfer of Care (Signed)
Immediate Anesthesia Transfer of Care Note  Patient: Shawn Taylor  Procedure(s) Performed: ANTERIOR CERVICAL CORPECTOMY C4 (N/A ) C5-6 ANTERIOR CERVICAL DECOMPRESSION/DISCECTOMY FUSION (N/A )  Patient Location: PACU  Anesthesia Type:General  Level of Consciousness: drowsy and patient cooperative  Airway & Oxygen Therapy: Patient Spontanous Breathing and Patient connected to face mask oxygen  Post-op Assessment: Report given to RN and Post -op Vital signs reviewed and stable  Post vital signs: Reviewed and stable  Last Vitals:  Vitals Value Taken Time  BP    Temp 36.2 C 04/17/21 1306  Pulse 102 04/17/21 1309  Resp 22 04/17/21 1309  SpO2 96 % 04/17/21 1309  Vitals shown include unvalidated device data.  Last Pain:  Vitals:   04/17/21 0816  TempSrc: Temporal  PainSc: 0-No pain         Complications: No complications documented.

## 2021-04-17 NOTE — Anesthesia Procedure Notes (Signed)
Procedure Name: Intubation Performed by: Fletcher-Harrison, Philemon Riedesel, CRNA Pre-anesthesia Checklist: Patient identified, Emergency Drugs available, Suction available and Patient being monitored Patient Re-evaluated:Patient Re-evaluated prior to induction Oxygen Delivery Method: Circle system utilized Preoxygenation: Pre-oxygenation with 100% oxygen Induction Type: IV induction Ventilation: Mask ventilation without difficulty Laryngoscope Size: McGraph and 3 Grade View: Grade I Tube type: Oral Tube size: 7.0 mm Number of attempts: 1 Airway Equipment and Method: Stylet and Oral airway Placement Confirmation: ETT inserted through vocal cords under direct vision,  positive ETCO2,  breath sounds checked- equal and bilateral and CO2 detector Secured at: 21 cm Tube secured with: Tape Dental Injury: Teeth and Oropharynx as per pre-operative assessment        

## 2021-04-17 NOTE — Op Note (Signed)
Indications: Mr. Bart is a 65 yo male who presented with cervical myelopathy.  He had weakness on presentation prompting surgical intervention  Findings: severe stenosis  Preoperative Diagnosis: Cervical myelopathy Postoperative Diagnosis: same   EBL: 200 ml IVF: 900 ml Drains: 1 placed Disposition: Extubated and Stable to PACU Complications: none  No foley catheter was placed.   Preoperative Note:   Risks of surgery discussed include: infection, bleeding, stroke, coma, death, paralysis, CSF leak, nerve/spinal cord injury, numbness, tingling, weakness, complex regional pain syndrome, recurrent stenosis and/or disc herniation, vascular injury, development of instability, neck/back pain, need for further surgery, persistent symptoms, development of deformity, and the risks of anesthesia. The patient understood these risks and agreed to proceed.  Operative Note:  PROCEDURES: 1. Anterior cervical corpectomy of C4, rmoving greater than 50% of the vertebral body at that level. 2. Anterior Arthrodesis from C3 to C6 3. Anterior cervical spine instrumentation C3 -C6 4. Insertion of biomechanic device (Globus fortify) as a vertebral body replacement 5. Harvesting of autograft via the same incision 6. Anterior cervical discectomy and fusion C5-6 including placement of biomechanical device  PROCEDURE IN DETAIL: After obtaining informed consent, the patient taken to the operating room, placed in supine position, general anesthesia induced.  The patient had a small shoulder roll placed behind their shoulders.  After a timeout, the patient received preop antibiotics and 10 mg of IV Decadron.  The patient had a neck incision outlined, was prepped and draped in usual sterile fashion. The incision was injected with local anesthetic.    An incision was opened, dissection taken down medial to the carotid artery and jugular vein, lateral to the trachea and esophagus.  The prevertebral fascia identified  and a localizing x-ray demonstrated the correct level.  The disc was marked with the Bovie. The soft tissues and longus colli were dissected laterally from the vertebral bodies from C3 to C6 using monopolar electrocautery. Blackbelt retractors were then placed. The microscope was brought into the field for aid in microdissection.   With this complete, distractor pins were placed in the vertebral bodies of C3 and C5. The distractor was placed and the disc spaces gently distracted. The annulus at C3/4 was opened using a Bovie. Using curettes and a pituitary rongeur, the disc was removed from the endplate. Using a nerve hook, the posterior longitudinal ligament was elevated and divided. Using curettes and a 51mm Kerrison punch, the posterior longitudinal ligament was removed and the spinal cord and neuroforamina decompressed. The nerve hook was used to confirm decompression.   Then, annulus at C4/5 was opened using a Bovie, cutting away from the esophagus. Using curettes and a pituitary rongeur, the disc was removed from the endplate. Using a nerve hook, the posterior longitudinal ligament was elevated and divided. Using curettes and a 28mm Kerrison punch, the posterior longitudinal ligament was removed and the spinal cord and neuroforamina decompressed. The nerve hook was used to confirm decompression.   After disc preparation, a portion of the C4 vertebral body was removed with a Leksell rongeur and saved for used as autograft. The high speed drill was then used to finish the corpectomy. At least 70% of the vertebral body was removed in total. The site was measured to confirm adequate width. The posterior longitudinal ligament was then elevated from the inferior disc space and divided superiorly until the vertebral body wall was removed.  The nerve hook was then used to confirm decompression of the spinal cord. The endplates were gently decorticated for arthrodesis  preparation.  After decompression, the  corpectomy defect was measured and a size 18/23 mm Globus Fortify vertebral body replacement was chosen. It was filled with autograft, and then placed used flouroscopic guidance. After this was placed, we moved to the anterior cervical discectomy and fusion.  The C3 pin was removed.  It was then placed in C6. The distractor was placed, and the annulus at C5/6 was opened using a bovie.  Curettes and pituitary rongeurs used to remove the majority of disk, then the drill was used to remove the posterior osteophyte and begin the foraminotomies. The nerve hook was used to elevate the posterior longitudinal ligament, which was then removed with Kerrison rongeurs. Bilateral foraminotomies were performed. The microblunt nerve hook could be passed out the foramen bilaterally.   Meticulous hemostasis was obtained.  A biomechanical device (Globus Hedron C 7 mm height x 14 mm width by 12 mm depth) was placed at C5/6. The device had been filled with autograft for aid in arthrodesis.  The caspar distractor was removed, and bone wax used for hemostasis. A 45 mm Globus Xtend plate was chosen.  Two screws placed in C3, C5, and C6, respectively making sure the screws were behind the locking mechanism.  Final AP and lateral radiographs were taken.   With everything in good position, the wound was irrigated copiously with bacitracin-containing solution and meticulous hemostasis obtained.  Wound was closed in 2 layers using interrupted inverted 3-0 Vicryl sutures.  The wound was dressed with dermabond, the head of bed at 30 degrees, taken to recovery room in stable condition.  No new postop neurological deficits were identified.  Sponge and pattie counts were correct at the end of the procedure.   I performed the entire procedure with the assistance of Anabel Halon PA as an Designer, television/film set.  Venetia Night MD  Monitoring was used throughout without changes.

## 2021-04-18 ENCOUNTER — Inpatient Hospital Stay: Payer: Medicaid Other

## 2021-04-18 ENCOUNTER — Encounter: Payer: Self-pay | Admitting: Neurosurgery

## 2021-04-18 DIAGNOSIS — I5022 Chronic systolic (congestive) heart failure: Secondary | ICD-10-CM

## 2021-04-18 DIAGNOSIS — G959 Disease of spinal cord, unspecified: Secondary | ICD-10-CM

## 2021-04-18 MED ORDER — OXYCODONE HCL 5 MG PO TABS: 5.0000 mg | ORAL_TABLET | ORAL | 0 refills | Status: DC | PRN

## 2021-04-18 MED ORDER — ACETAMINOPHEN 325 MG PO TABS: 650.0000 mg | ORAL_TABLET | ORAL | Status: DC | PRN

## 2021-04-18 MED ORDER — METHOCARBAMOL 500 MG PO TABS: 500.0000 mg | ORAL_TABLET | Freq: Four times a day (QID) | ORAL | 0 refills | Status: DC | PRN

## 2021-04-18 NOTE — TOC Progression Note (Signed)
Transition of Care [facility]) - Progression Note    Patient Details  Name: Shawn Taylor MRN: [id number] Date of Birth: 06-11-1956  Transition of Care [facility]) CM/SW [place], RN Phone Number: 04/18/2021, 3:07 PM  Clinical Narrative:     Met with the patient to discuss DC plan an needs He lives alone but his son checks on him daily and provides transportation for him, he has a RW and a 3 in 1 at home, He can afford his medication Will continue to monitor for needs       Expected Discharge Plan and Services                                                 Social Determinants of Health (SDOH) Interventions    Readmission Risk Interventions Readmission Risk Prevention Plan 03/07/2020  Transportation Screening Complete  PCP or Specialist appointment within 3-5 days of discharge Complete  HRI or Home Care Consult Complete  SW Recovery Care/Counseling Consult Complete  [place] Not Applicable  Some recent data might be hidden

## 2021-04-18 NOTE — Discharge Summary (Signed)
Physician Discharge Summary  Patient ID: Shawn Taylor MRN: [id number] DOB/AGE: 08/16/1956 65 y.o.  Admit date: 04/17/2021 Discharge date: 04/18/2021  Admission Diagnoses: cervical myelopathy  Discharge Diagnoses:  Active Problems:   Atrial flutter (HCC)   Hypertension   Atherosclerosis of native arteries of extremity with intermittent claudication (HCC)   Cervical myelopathy (HCC)   Polysubstance abuse (HCC)   Chronic systolic CHF (congestive heart failure) (HCC)   Discharged Condition: good  Hospital Course: Mr. Dalziel was admitted to [facility] for surgical intervention.  He did well with surgery, and was felt to be stable for discharge on POD1.  Internal medicine consulted in his care and helped stabilize him for discharge.  Consults: internal medicine  Significant Diagnostic Studies: radiology: X-Ray: C spine xrays show good placement  Treatments: surgery: C4 corpectomy, C5-6 ACDF, C3-6 Anterior instrumentation  Discharge Exam: Blood pressure (!) 158/91, pulse 73, temperature (!) 97.4 F (36.3 C), temperature source Oral, resp. rate 16, SpO2 97 %. General appearance: alert and cooperative  CNI Swallowing well MAEW Incision c/d/i  Disposition: Discharge disposition: 01-Home or Self Care       Discharge Instructions    Discharge patient   Complete by: As directed    Discharge disposition: 01-Home or Self Care   Discharge patient date: 04/18/2021   Incentive spirometry RT   Complete by: As directed      Allergies as of 04/18/2021   No Known Allergies     Medication List    TAKE these medications   acetaminophen 325 MG tablet Commonly known as: TYLENOL Take 2 tablets (650 mg total) by mouth every 4 (four) hours as needed for mild pain ((score 1 to 3) or temp > 100.5).   apixaban 5 MG Tabs tablet Commonly known as: ELIQUIS Take 1 tablet (5 mg total) by mouth 2 (two) times daily. What changed: when to take this Notes to patient: Do not start back until May 01, 2021   aspirin 81 MG EC tablet Take 81 mg by mouth daily. Notes to patient: Ok to restart on Apr 22, 2021   atorvastatin 40 MG tablet Commonly known as: LIPITOR Take 1 tablet (40 mg total) by mouth daily at 10 pm.   dapagliflozin propanediol 10 MG Tabs tablet Commonly known as: FARXIGA Take 10 mg by mouth daily. TAKING FOR HIS HEART   folic acid 1 MG tablet Commonly known as: FOLVITE Take 1 mg by mouth daily with lunch.   furosemide 40 MG tablet Commonly known as: LASIX Take 20 mg by mouth every morning.   gabapentin 300 MG capsule Commonly known as: NEURONTIN TAKE ONE CAPSULE BY MOUTH AT BEDTIME FOR PAIN What changed:   how much to take  how to take this  when to take this   hydrALAZINE 25 MG tablet Commonly known as: APRESOLINE Take 25 mg by mouth 3 (three) times daily.   isosorbide mononitrate 30 MG 24 hr tablet Commonly known as: IMDUR Take 30 mg by mouth every morning.   methocarbamol 500 MG tablet Commonly known as: ROBAXIN Take 1 tablet (500 mg total) by mouth every 6 (six) hours as needed for muscle spasms.   metoprolol succinate 25 MG 24 hr tablet Commonly known as: TOPROL-XL Take 25 mg by mouth every morning.   oxyCODONE 5 MG immediate release tablet Commonly known as: Oxy IR/ROXICODONE Take 1 tablet (5 mg total) by mouth every 3 (three) hours as needed for moderate pain ((score 4 to 6)).   sacubitril-valsartan 49-51 MG  Commonly known as: ENTRESTO Take 1 tablet by mouth 2 (two) times daily.   spironolactone 25 MG tablet Commonly known as: ALDACTONE Take 0.5 tablets (12.5 mg total) by mouth daily. What changed:   how much to take  when to take this   thiamine 100 MG tablet Take 1 tablet (100 mg total) by mouth daily.       Follow-up Information    Venetia Night, MD Follow up in 2 week(s).   Specialty: Neurosurgery Why: alrady scheduled Contact information: [address] [place] [address] [phone]                Signed: Venetia Night 04/18/2021, 5:38 PM

## 2021-04-18 NOTE — Progress Notes (Signed)
    Attending Progress Note  History: Shawn Taylor is here for cervical myelopathy.  DOS 04/17/21 - C4 corpectomy, C5-6 ACDF  POD1: Doing very well.  Has expected pain.  Swallowing well.  Physical Exam: Vitals:   04/18/21 0541 04/18/21 0741  BP: (!) 176/81 (!) 186/80  Pulse: 69 78  Resp: 16 16  Temp: 98.1 F (36.7 C) 98.1 F (36.7 C)  SpO2: 98% 94%    AA Ox3 CNI  Strength:5/5 throughout BUE Neck soft. Incision c/d/i  Data:  Recent Labs  Lab 04/17/21 1641  NA 136  K 4.5  CL 106  CO2 19*  BUN 25*  CREATININE 1.28*  GLUCOSE 153*  CALCIUM 9.0   Recent Labs  Lab 04/17/21 1641  AST 21  ALT 19  ALKPHOS 149*     Recent Labs  Lab 04/17/21 1641  WBC 15.2*  HGB 13.4  HCT 42.4  PLT 300   No results for input(s): APTT, INR in the last 168 hours.       Other tests/results: xrays pending  Assessment/Plan:  TREVEN HOLTMAN is doing well after complex cervical reconstruction  - mobilize - pain control - DVT prophylaxis - PTOT - Monitoring drain output   Venetia Night MD, [facility] Department of Neurosurgery

## 2021-04-18 NOTE — Discharge Instructions (Signed)
Your surgeon has performed an operation on your cervical spine (neck) to relieve pressure on the spinal cord and/or nerves. This involved making an incision in the front of your neck and removing one or more of the discs that support your spine. Next, a small piece of bone, a titanium plate, and screws were used to fuse two or more of the vertebrae (bones) together.  The following are instructions to help in your recovery once you have been discharged from the hospital. Even if you feel well, it is important that you follow these activity guidelines. If you do not let your neck heal properly from the surgery, you can increase the chance of return of your symptoms and other complications.  * Do not take anti-inflammatory medications for 3 months after surgery (naproxen [Aleve], ibuprofen [Advil, Motrin], celecoxib [Celebrex], etc.). These medications can prevent your bones from healing properly.  Activity    No bending, lifting, or twisting ("BLT"). Avoid lifting objects heavier than 10 pounds (gallon milk jug).  Where possible, avoid household activities that involve lifting, bending, reaching, pushing, or pulling such as laundry, vacuuming, grocery shopping, and childcare. Try to arrange for help from friends and family for these activities while your back heals.  Increase physical activity slowly as tolerated.  Taking short walks is encouraged, but avoid strenuous exercise. Do not jog, run, bicycle, lift weights, or participate in any other exercises unless specifically allowed by your doctor.  Talk to your doctor before resuming sexual activity.  You should not drive until cleared by your doctor.  Until released by your doctor, you should not return to work or school.  You should rest at home and let your body heal.   You may shower three days after your surgery.  After showering, lightly dab your incision dry. Do not take a tub bath or go swimming until approved by your doctor at your  follow-up appointment.  If your doctor ordered a cervical collar (neck brace) for you, you should wear it whenever you are out of bed. You may remove it when lying down or sleeping, but you should wear it at all other times. Not all neck surgeries require a cervical collar.  If you smoke, we strongly recommend that you quit.  Smoking has been proven to interfere with normal bone healing and will dramatically reduce the success rate of your surgery. Please contact [facility] (800-QUIT-NOW) and use the resources at www.[facility].com for assistance in stopping smoking.  Surgical Incision   If you have a dressing on your incision, you may remove it two days after your surgery. Keep your incision area clean and dry.  If you have staples or stitches on your incision, you should have a follow up scheduled for removal. If you do not have staples or stitches, you will have steri-strips (small pieces of surgical tape) or Dermabond glue. The steri-strips/glue should begin to peel away within about a week (it is fine if the steri-strips fall off before then). If the strips are still in place one week after your surgery, you may gently remove them.  Diet           You may return to your usual diet. However, you may experience discomfort when swallowing in the first month after your surgery. This is normal. You may find that softer foods are more comfortable for you to swallow. Be sure to stay hydrated.  When to Contact Us  You may experience pain in your neck and/or pain between your shoulder   blades. This is normal and should improve in the next few weeks with the help of pain medication, muscle relaxers, and rest. Some patients report that a warm compress on the back of the neck or between the shoulder blades helps.  However, should you experience any of the following, contact us immediately: New numbness or weakness Pain that is progressively getting worse, and is not relieved by your pain medication,  muscle relaxers, rest, and warm compresses Bleeding, redness, swelling, pain, or drainage from surgical incision Chills or flu-like symptoms Fever greater than 101.0 F (38.3 C) Inability to eat, drink fluids, or take medications Problems with bowel or bladder functions Difficulty breathing or shortness of breath Warmth, tenderness, or swelling in your calf Contact Information During office hours (Monday-Friday 9 am to 5 pm), please call your physician at [phone] and ask for Kendelyn Jean After hours and weekends, please call [phone] and speak with the answering service, who will contact the doctor on call.  If that fails, call the [facility] Operator at [phone] and ask for the Neurosurgery Resident On Call  For a life-threatening emergency, call 911   

## 2021-04-18 NOTE — Evaluation (Signed)
Physical Therapy Evaluation Patient Details Name: Shawn Taylor MRN: [id number] DOB: December 30, 1955 Today's Date: 04/18/2021   History of Present Illness  65 y/o male s/p C4 corpectomy, C5-6 ACDF.  Clinical Impression  Pt showed good effort with all aspects of PT exam and subsequent gait training and education.  He was able to circumambulate the nurses' station w/o AD and with consistent cadence.  He did have b/l LE eversion posture, that he reports is baseline, additionally reports that he was to have a vascular surgery for his R LE until L UE nerve issues superseded this in priority.  He is still having some minimal residual L UE symptoms but sensation, strength, coordination have all improved greatly with surgery.  Pt safe to return home, has family that will be able to provide assistance with the transition back to more independent status.      Follow Up Recommendations Follow surgeon's recommendation for DC plan and follow-up therapies    Equipment Recommendations  None recommended by PT    Recommendations for Other Services       Precautions / Restrictions Precautions Precautions: Cervical Required Braces or Orthoses: Cervical Brace Restrictions Weight Bearing Restrictions: No      Mobility  Bed Mobility Overal bed mobility: Modified Independent             General bed mobility comments: Pt able to get himself to EOB, did not nee heavy use of rails    Transfers Overall transfer level: Needs assistance Equipment used: None Transfers: Sit to/from Stand Sit to Stand: Min guard         General transfer comment: did not need direct assist, stood with good confidence.  Ambulation/Gait Ambulation/Gait assistance: Supervision Gait Distance (Feet): 250 Feet Assistive device: None       General Gait Details: Pt with b/l splayed LEs, reports this as baseline.  He did not have any buckling or safety issues with circumambulation of the nurses' station, reports that he  does not typically walk much more than this as his PAD causes leg and back pain with prolonged standing.  Pt safe with in-home distances.  Stairs Stairs: Yes Stairs assistance: Supervision Stair Management: One rail Right;Sideways Number of Stairs: 4 General stair comments: Pt with definite reliance on UEs, but able to negotiate up/down steps safely  Wheelchair Mobility    Modified Rankin (Stroke Patients Only)       Balance Overall balance assessment: Modified Independent                                           Pertinent Vitals/Pain Pain Assessment: 0-10 Pain Score: 4  Pain Location: report more pain in shoulder blade area than neck    Home Living Family/patient expects to be discharged to:: Private residence Living Arrangements: Alone Available Help at Discharge: Family;Available PRN/intermittently (son checks on him regularly, has other family that will be helping acutely)   Home Access: Stairs to enter Entrance Stairs-Rails: Right Entrance Stairs-Number of Steps: 2 Home Layout: One level Home Equipment: None      Prior Function Level of Independence: Independent         Comments: Pt does not drive but son takes him and he is able to ambulate in the community.  Does have R LE vascular issues that need surgically addressed but he does not use AD, etc     Hand Dominance  Extremity/Trunk Assessment   Upper Extremity Assessment Upper Extremity Assessment: Generalized weakness (Pt report that L UE symptoms have improved greatly since surgery.  Grip close to equal bilaterally, functional strength in b/l UEs with expected post-op hesitancy)    Lower Extremity Assessment Lower Extremity Assessment: Generalized weakness;Overall WFL for tasks assessed (R grossly 4-/5, L 4/5)       Communication   Communication: No difficulties  Cognition Arousal/Alertness: Awake/alert Behavior During Therapy: WFL for tasks assessed/performed Overall  Cognitive Status: Within Functional Limits for tasks assessed                                        General Comments      Exercises     Assessment/Plan    PT Assessment Patient needs continued PT services  PT Problem List Decreased strength;Decreased range of motion;Decreased activity tolerance;Decreased balance;Decreased mobility;Decreased knowledge of use of DME;Decreased safety awareness;Decreased knowledge of precautions;Pain       PT Treatment Interventions DME instruction;Gait training;Stair training;Functional mobility training;Therapeutic activities;Therapeutic exercise;Balance training;Cognitive remediation    PT Goals (Current goals can be found in the Care Plan section)  Acute Rehab PT Goals Patient Stated Goal: go home PT Goal Formulation: With patient Time For Goal Achievement: 05/02/21 Potential to Achieve Goals: Good    Frequency 7X/week   Barriers to discharge        Co-evaluation               AM-PAC PT "6 Clicks" Mobility  Outcome Measure Help needed turning from your back to your side while in a flat bed without using bedrails?: None Help needed moving from lying on your back to sitting on the side of a flat bed without using bedrails?: None Help needed moving to and from a bed to a chair (including a wheelchair)?: None Help needed standing up from a chair using your arms (e.g., wheelchair or bedside chair)?: None Help needed to walk in hospital room?: A Little Help needed climbing 3-5 steps with a railing? : A Little 6 Click Score: 22    End of Session Equipment Utilized During Treatment: Gait belt Activity Tolerance: Patient tolerated treatment well Patient left: with chair alarm set;with call bell/phone within reach Nurse Communication: Mobility status PT Visit Diagnosis: Muscle weakness (generalized) (M62.81);Pain;Other symptoms and signs involving the nervous system (R29.898);Other abnormalities of gait and mobility  (R26.89) Pain - Right/Left: Left Pain - part of body: Arm    Time: 7858-8502 PT Time Calculation (min) (ACUTE ONLY): 20 min   Charges:   PT Evaluation $PT Eval Low Complexity: 1 Low PT Treatments $Gait Training: 8-22 mins        Malachi Pro, DPT 04/18/2021, 11:50 AM

## 2021-04-18 NOTE — Evaluation (Signed)
Occupational Therapy Evaluation Patient Details Name: Shawn Taylor MRN: [id number] DOB: July 10, 1956 Today's Date: 04/18/2021    History of Present Illness 65 y/o male s/p C4 corpectomy, C5-6 ACDF.   Clinical Impression   Pt seen for OT evaluation this date in setting of hospitalization s/p ACDF. Pt presents this date with reports of improving L side sensation and generally feeling he is close to his baseline. Pt reports being INDEP at baseline with ADLs/ADL mobility. Pt presents this date with functional UE ROM and strength for completion of self care ADLs/ADL mobility. In addition, while pt's R LE is slightly weaker (baseline for him 2/2 vascular issues) he demos good control for ADL transfers and performs with MOD I/SUPV with no AD and no physical assistance from therapist. OT educates pt re: role of OT as well as cervical collar wear and precautions. Pt with good understanding and carryover of education. No further OT needs perceived at this time.     Follow Up Recommendations  No OT follow up    Equipment Recommendations  None recommended by OT    Recommendations for Other Services       Precautions / Restrictions Precautions Precautions: Cervical Required Braces or Orthoses: Cervical Brace Restrictions Weight Bearing Restrictions: No Other Position/Activity Restrictions: able to remove for bathing, able to amb to restroom w/o cervical collar, don in sitting      Mobility Bed Mobility Overal bed mobility: Modified Independent                  Transfers Overall transfer level: Modified independent Equipment used: None Transfers: Sit to/from Stand Sit to Stand: Supervision         General transfer comment: no physical assist, demos good control    Balance Overall balance assessment: Modified Independent                                         ADL either performed or assessed with clinical judgement   ADL Overall ADL's : Modified  independent;At baseline                                             Vision Patient Visual Report: No change from baseline       Perception     Praxis      Pertinent Vitals/Pain Pain Assessment: No/denies pain     Hand Dominance Right   Extremity/Trunk Assessment Upper Extremity Assessment Upper Extremity Assessment: Generalized weakness (pt reports sensation in L UE has improved to be equal to R side. Pt with ROM WFL bilaterally. Grip close to equal. [facility] appropriate)   Lower Extremity Assessment Lower Extremity Assessment: Generalized weakness (R slightly weaker than L LE which pt states is baseline for him d/t vascular issues. ROM equal bilaterally and functional for ADL tasks such as LB dressing)       Communication Communication Communication: No difficulties   Cognition Arousal/Alertness: Awake/alert Behavior During Therapy: WFL for tasks assessed/performed Overall Cognitive Status: Within Functional Limits for tasks assessed                                     General Comments       Exercises Other  Exercises Other Exercises: OT facilitates ed re: role of OT, cervical brace wear and precautions. Pt with good understanding.   Shoulder Instructions      Home Living Family/patient expects to be discharged to:: Private residence Living Arrangements: Alone Available Help at Discharge: Family;Available PRN/intermittently (son checks on him regularly, has other family that will be helping acutely) Type of Home: House Home Access: Stairs to enter [employer] of Steps: 2 Entrance Stairs-Rails: Right Home Layout: One level         Bathroom Toilet: Standard     Home Equipment: None          Prior Functioning/Environment Level of Independence: Independent        Comments: Pt does not drive but son takes him and he is able to ambulate in the community.  Does have R LE vascular issues that need surgically  addressed but he does not use AD, etc        OT Problem List: Decreased activity tolerance      OT Treatment/Interventions: Self-care/ADL training;Therapeutic activities    OT Goals(Current goals can be found in the care plan section) Acute Rehab OT Goals Patient Stated Goal: go home OT Goal Formulation: All assessment and education complete, DC therapy  OT Frequency:     Barriers to D/C:            Co-evaluation              AM-PAC OT "6 Clicks" Daily Activity     Outcome Measure Help from another person eating meals?: None Help from another person taking care of personal grooming?: None Help from another person toileting, which includes using toliet, bedpan, or urinal?: None Help from another person bathing (including washing, rinsing, drying)?: None Help from another person to put on and taking off regular upper body clothing?: None Help from another person to put on and taking off regular lower body clothing?: None 6 Click Score: 24   End of Session Nurse Communication: Mobility status  Activity Tolerance: Patient tolerated treatment well Patient left: in bed;with call bell/phone within reach  OT Visit Diagnosis: Muscle weakness (generalized) (M62.81)                Time: 0174-9449 OT Time Calculation (min): 18 min Charges:  OT General Charges $OT Visit: 1 Visit OT Evaluation $OT Eval Low Complexity: 1 Low OT Treatments $Self Care/Home Management : 8-22 mins  Rejeana Brock, MS, OTR/L ascom [phone] 04/18/21, 6:34 PM

## 2021-04-18 NOTE — Progress Notes (Signed)
Progress Note    JAHDEN Taylor  PIR:[id number] DOB: January 24, 1956  DOA: 04/17/2021 PCP: Preston Fleeting, MD      Brief Narrative:    Medical records reviewed and are as summarized below:  Shawn Taylor is a 65 y.o. male with multiple medical problems which include coronary artery disease, ischemic cardiomyopathy, atrial flutter on chronic anticoagulation therapy, history of DVT who was admitted to the neurosurgery service and is status post anterior cervical corpectomy of C4, anterior cervical discectomy and fusion C5-C6, anterior arthrodesis from C3-C6 for cervical myelopathy. Medical consult was requested for management of patient's medical problems.      Assessment/Plan:   Active Problems:   Atrial flutter (HCC)   Hypertension   Atherosclerosis of native arteries of extremity with intermittent claudication (HCC)   Cervical myelopathy (HCC)   Polysubstance abuse (HCC)   Chronic systolic CHF (congestive heart failure) (HCC)   PLAN  Continue antihypertensives and diuretics for hypertension and CHF. Continue CIWA protocol for alcohol use disorder. It is unclear whether patient had AKI over the years underlying CKD.  Repeat BMP tomorrow.  Recommend holding celecoxib. Continue analgesics for recent surgery and follow-up with neurosurgeon for further recommendations. Eliquis has been held because of recent neck surgery.       Diet Order            Diet Heart Room service appropriate? Yes; Fluid consistency: Thin  Diet effective now                    Consultants:  Neurosurgeon  Procedures:  anterior cervical corpectomy of C4, anterior cervical discectomy and fusion C5-C6, anterior arthrodesis from C3-C6 for cervical myelopathy.      Medications:   . atorvastatin  40 mg Oral Q2200  . celecoxib  200 mg Oral Q12H  . dapagliflozin propanediol  10 mg Oral Daily  . enoxaparin (LOVENOX) injection  40 mg Subcutaneous Q24H  . folic acid  1 mg  Oral Daily  . furosemide  20 mg Oral BH-q7a  . gabapentin  300 mg Oral QHS  . hydrALAZINE  25 mg Oral TID  . isosorbide mononitrate  30 mg Oral BH-q7a  . LORazepam  0-4 mg Oral Q6H   Followed by  . [START ON 04/19/2021] LORazepam  0-4 mg Oral Q12H  . metoprolol succinate  25 mg Oral BH-q7a  . multivitamin with minerals  1 tablet Oral Daily  . nicotine  7 mg Transdermal Daily  . sacubitril-valsartan  1 tablet Oral BID  . sodium chloride flush  3 mL Intravenous Q12H  . spironolactone  25 mg Oral BH-q7a  . thiamine  100 mg Oral Daily   Or  . thiamine  100 mg Intravenous Daily   Continuous Infusions: . sodium chloride    . 0.9 % NaCl with KCl 20 mEq / L 75 mL/hr at 04/18/21 0512  . methocarbamol (ROBAXIN) IV Stopped (04/17/21 1722)     Anti-infectives (From admission, onward)   Start     Dose/Rate Route Frequency Ordered Stop   04/17/21 1500  ceFAZolin (ANCEF) IVPB 2g/100 mL premix        2 g 200 mL/hr over 30 Minutes Intravenous Every 8 hours 04/17/21 1409 04/18/21 0159   04/17/21 0823  ceFAZolin (ANCEF) 2-4 GM/100ML-% IVPB       Note to Pharmacy: Christene Slates   : cabinet override      04/17/21 0823 04/17/21 1037   04/17/21 0500  ceFAZolin (  ANCEF) IVPB 2g/100 mL premix        2 g 200 mL/hr over 30 Minutes Intravenous 60 min pre-op 04/16/21 2302 04/17/21 0958             Family Communication/Anticipated D/C date and plan/Code Status   DVT prophylaxis: enoxaparin (LOVENOX) injection 40 mg Start: 04/18/21 0800 SCD's Start: 04/17/21 1330 Place TED hose Start: 04/17/21 0726     Code Status: Full Code  Family Communication: None  Disposition Plan: Disposition to be determined by neurosurgeon        Subjective:   No shortness of breath or chest pain.  He has adequate urine output.  Objective:    Vitals:   04/17/21 1634 04/17/21 2026 04/18/21 0541 04/18/21 0741  BP: (!) 142/60 (!) 164/69 (!) 176/81 (!) 186/80  Pulse: 72 79 69 78  Resp: 16 16 16 16    Temp: (!) 97.4 F (36.3 C) 98.3 F (36.8 C) 98.1 F (36.7 C) 98.1 F (36.7 C)  TempSrc: Oral Oral Oral Oral  SpO2: 96% 100% 98% 94%   No data found.   Intake/Output Summary (Last 24 hours) at 04/18/2021 1553 Last data filed at 04/18/2021 1426 Gross per 24 hour  Intake 859.94 ml  Output 1485 ml  Net -625.06 ml   There were no vitals filed for this visit.  Exam:  GEN: NAD SKIN: Warm and dry.  Dressing on neck surgical incision is clean, dry and intact EYES: No pallor or icterus ENT: MMM CV: RRR PULM: CTA B ABD: soft, ND, NT, +BS CNS: AAO x 3, non focal EXT: No edema or tenderness        Data Reviewed:   I have personally reviewed following labs and imaging studies:  Labs: Labs show the following:   Basic Metabolic Panel: Recent Labs  Lab 04/17/21 1641  NA 136  K 4.5  CL 106  CO2 19*  GLUCOSE 153*  BUN 25*  CREATININE 1.28*  CALCIUM 9.0  MG 1.8  PHOS 3.2   GFR Estimated Creatinine Clearance: 68.8 mL/min (A) (by C-G formula based on SCr of 1.28 mg/dL (H)). Liver Function Tests: Recent Labs  Lab 04/17/21 1641  AST 21  ALT 19  ALKPHOS 149*  BILITOT 0.5  PROT 8.2*  ALBUMIN 4.2   No results for input(s): LIPASE, AMYLASE in the last 168 hours. No results for input(s): AMMONIA in the last 168 hours. Coagulation profile No results for input(s): INR, PROTIME in the last 168 hours.  CBC: Recent Labs  Lab 04/17/21 1641  WBC 15.2*  HGB 13.4  HCT 42.4  MCV 91.8  PLT 300   Cardiac Enzymes: No results for input(s): CKTOTAL, CKMB, CKMBINDEX, TROPONINI in the last 168 hours. BNP (last 3 results) No results for input(s): PROBNP in the last 8760 hours. CBG: No results for input(s): GLUCAP in the last 168 hours. D-Dimer: No results for input(s): DDIMER in the last 72 hours. Hgb A1c: No results for input(s): HGBA1C in the last 72 hours. Lipid Profile: No results for input(s): CHOL, HDL, LDLCALC, TRIG, CHOLHDL, LDLDIRECT in the last 72  hours. Thyroid function studies: No results for input(s): TSH, T4TOTAL, T3FREE, THYROIDAB in the last 72 hours.  Invalid input(s): FREET3 Anemia work up: No results for input(s): VITAMINB12, FOLATE, FERRITIN, TIBC, IRON, RETICCTPCT in the last 72 hours. Sepsis Labs: Recent Labs  Lab 04/17/21 1641  WBC 15.2*    Microbiology Recent Results (from the past 240 hour(s))  SARS CORONAVIRUS 2 (TAT 6-24 HRS) Nasopharyngeal  Nasopharyngeal Swab     Status: None   Collection Time: 04/15/21  9:46 AM   Specimen: Nasopharyngeal Swab  Result Value Ref Range Status   SARS Coronavirus 2 NEGATIVE NEGATIVE Final    Comment: (NOTE) SARS-CoV-2 target nucleic acids are NOT DETECTED.  The SARS-CoV-2 RNA is generally detectable in upper and lower respiratory specimens during the acute phase of infection. Negative results do not preclude SARS-CoV-2 infection, do not rule out co-infections with other pathogens, and should not be used as the sole basis for treatment or other patient management decisions. Negative results must be combined with clinical observations, patient history, and epidemiological information. The expected result is Negative.  Fact Sheet for Patients: HairSlick.no  Fact Sheet for Healthcare Providers: quierodirigir.com  This test is not yet approved or cleared by the [place] FDA and  has been authorized for detection and/or diagnosis of SARS-CoV-2 by FDA under an Emergency Use Authorization (EUA). This EUA will remain  in effect (meaning this test can be used) for the duration of the COVID-19 declaration under Se ction 564(b)(1) of the Act, 21 U.S.C. section 360bbb-3(b)(1), unless the authorization is terminated or revoked sooner.  Performed at [facility], [address] [place], [place] [address]     Procedures and diagnostic studies:  DG Cervical Spine 2 or 3 views  Result Date: 04/18/2021 CLINICAL  DATA:  Status post cervical fusion. EXAM: CERVICAL SPINE - 2-3 VIEW COMPARISON:  Apr 17, 2021. FINDINGS: Status post surgical anterior fusion extending from C3-C6 with C4 corpectomy and spacer placement. Good alignment of vertebral bodies is noted. IMPRESSION: Postsurgical changes as described above. Electronically Signed   By: Lupita Raider M.D.   On: 04/18/2021 11:54   DG Cervical Spine 2 or 3 views  Result Date: 04/17/2021 CLINICAL DATA:  Surgery.  ACDF. EXAM: CERVICAL SPINE - 2-3 VIEW; DG C-ARM 1-60 MIN COMPARISON:  MRI cervical spine February 19, 2021. FINDINGS: Six C-arm fluoroscopic images were obtained intraoperatively and submitted for post operative interpretation. These images demonstrate postsurgical changes of anterior plate and screwscrews spanning from C3-C6 with corpectomy at C4 and intervening spacer at C5-C6. No unexpected findings. Please see the performing provider's procedural report for further detail. IMPRESSION: Intraoperative fluoroscopy, as detailed above. Electronically Signed   By: Feliberto Harts MD   On: 04/17/2021 13:20   DG C-Arm 1-60 Min  Result Date: 04/17/2021 CLINICAL DATA:  Surgery.  ACDF. EXAM: CERVICAL SPINE - 2-3 VIEW; DG C-ARM 1-60 MIN COMPARISON:  MRI cervical spine February 19, 2021. FINDINGS: Six C-arm fluoroscopic images were obtained intraoperatively and submitted for post operative interpretation. These images demonstrate postsurgical changes of anterior plate and screwscrews spanning from C3-C6 with corpectomy at C4 and intervening spacer at C5-C6. No unexpected findings. Please see the performing provider's procedural report for further detail. IMPRESSION: Intraoperative fluoroscopy, as detailed above. Electronically Signed   By: Feliberto Harts MD   On: 04/17/2021 13:20   DG C-Arm 1-60 Min  Result Date: 04/17/2021 CLINICAL DATA:  Surgery.  ACDF. EXAM: CERVICAL SPINE - 2-3 VIEW; DG C-ARM 1-60 MIN COMPARISON:  MRI cervical spine February 19, 2021. FINDINGS:  Six C-arm fluoroscopic images were obtained intraoperatively and submitted for post operative interpretation. These images demonstrate postsurgical changes of anterior plate and screwscrews spanning from C3-C6 with corpectomy at C4 and intervening spacer at C5-C6. No unexpected findings. Please see the performing provider's procedural report for further detail. IMPRESSION: Intraoperative fluoroscopy, as detailed above. Electronically Signed   By:  Feliberto Harts MD   On: 04/17/2021 13:20   DG C-Arm 1-60 Min  Result Date: 04/17/2021 CLINICAL DATA:  Surgery.  ACDF. EXAM: CERVICAL SPINE - 2-3 VIEW; DG C-ARM 1-60 MIN COMPARISON:  MRI cervical spine February 19, 2021. FINDINGS: Six C-arm fluoroscopic images were obtained intraoperatively and submitted for post operative interpretation. These images demonstrate postsurgical changes of anterior plate and screwscrews spanning from C3-C6 with corpectomy at C4 and intervening spacer at C5-C6. No unexpected findings. Please see the performing provider's procedural report for further detail. IMPRESSION: Intraoperative fluoroscopy, as detailed above. Electronically Signed   By: Feliberto Harts MD   On: 04/17/2021 13:20               LOS: 1 day   Petra Sargeant  [facility]   Pager on [email or web address]. If 7PM-7AM, please contact night-coverage at [email or web address]     04/18/2021, 3:53 PM

## 2021-04-18 NOTE — Progress Notes (Signed)
Patient A & O and able to make needs known. AVS reviewed with patient who verbalized understanding via teach back re medications, follow up appointments, signs and symptoms to notify MD as well as limitations and restrictions. 

## 2021-06-10 NOTE — Progress Notes (Signed)
Patient ID: Shawn Taylor, male    DOB: 09-03-1956, 65 y.o.   MRN: [id number]   Mr Griggs is a 65 y/o male with a history of HTN, atrial flutter, DVT, thrombocytopenia, alcohol use,Current tobacco use and chronic heart failure.   Echo report from 02/14/20 reviewed and showed an EF of <20% along with moderate MR/TR.   Admitted 04/17/21 due to cervical myelopathy. C4 corpectomy, C5-6 ACDF, C3-6 Anterior instrumentation completed and he was discharged the next day.    He presents today for a follow-up visit with a chief complaint of minimal shortness of breath upon moderate exertion. He describes this as chronic in nature having been present for several years. He has associated cough and rhinorrhea along with this. He denies any difficulty sleeping, dizziness, abdominal distention, palpitations, pedal edema, chest pain, fatigue or weight gain.   Says that he's recovered nicely from his neck surgery a few months ago. Weighing himself every 1-2 days.   Past Medical History:  Diagnosis Date   Atrial flutter (HCC)    CAD (coronary artery disease)    Cardiac arrest (HCC) 02/12/2020   WITH HIS MI   Carotid artery occlusion    Cervical myelopathy (HCC)    CHF (congestive heart failure) (HCC)    DOE (dyspnea on exertion)    DVT (deep venous thrombosis) (HCC)    HCV (hepatitis C virus) 2019   Tx'd with Harvoni course   HLD (hyperlipidemia)    Hypertension    Migraines    Myocardial infarction (HCC) 02/12/2020   PAD (peripheral artery disease) (HCC)    Polysubstance abuse (HCC)    Cocaine and ETOH   Thrombocytopenia (HCC)    Past Surgical History:  Procedure Laterality Date   ANTERIOR CERVICAL CORPECTOMY N/A 04/17/2021   Procedure: ANTERIOR CERVICAL CORPECTOMY C4;  Surgeon: Venetia Night, MD;  Location: [facility];  Service: Neurosurgery;  Laterality: N/A;  2nd case   ANTERIOR CERVICAL DECOMP/DISCECTOMY FUSION N/A 04/17/2021   Procedure: C5-6 ANTERIOR CERVICAL DECOMPRESSION/DISCECTOMY  FUSION;  Surgeon: Venetia Night, MD;  Location: [facility];  Service: Neurosurgery;  Laterality: N/A;   LOWER EXTREMITY ANGIOGRAPHY Right 01/29/2021   Procedure: LOWER EXTREMITY ANGIOGRAPHY;  Surgeon: Renford Dills, MD;  Location: [facility];  Service: Cardiovascular;  Laterality: Right;   Family History  Problem Relation Age of Onset   Diabetes Sister    Social History   Tobacco Use   Smoking status: Every Day    Packs/day: 0.25    Years: 55.00    Pack years: 13.75    Types: Cigarettes   Smokeless tobacco: Never  Substance Use Topics   Alcohol use: Not Currently    Alcohol/week: 2.0 - 3.0 standard drinks    Types: 2 - 3 Cans of beer per week   No Known Allergies  Prior to Admission medications   Medication Sig Start Date End Date Taking? Authorizing Provider  acetaminophen (TYLENOL) 325 MG tablet Take 2 tablets (650 mg total) by mouth every 4 (four) hours as needed for mild pain ((score 1 to 3) or temp > 100.5). 04/18/21  Yes Venetia Night, MD  apixaban (ELIQUIS) 5 MG TABS tablet Take 1 tablet (5 mg total) by mouth 2 (two) times daily. Patient taking differently: Take 5 mg by mouth every 12 (twelve) hours. 03/07/20  Yes Lurene Shadow, MD  aspirin 81 MG EC tablet Take 81 mg by mouth daily.   Yes [provider]  atorvastatin (LIPITOR) 40 MG tablet Take 1 tablet (  40 mg total) by mouth daily at 10 pm. 03/07/20  Yes Lurene Shadow, MD  dapagliflozin propanediol (FARXIGA) 10 MG TABS tablet Take 10 mg by mouth daily. TAKING FOR HIS HEART   Yes [provider]  folic acid (FOLVITE) 1 MG tablet Take 1 mg by mouth daily with lunch. 07/13/20 07/13/21 Yes [provider]  furosemide (LASIX) 40 MG tablet Take 20 mg by mouth every morning.   Yes [provider]  gabapentin (NEURONTIN) 300 MG capsule TAKE ONE CAPSULE BY MOUTH AT BEDTIME FOR PAIN Patient taking differently: Take 300 mg by mouth at bedtime. 04/16/20  Yes Revelo, Presley Raddle,  MD  hydrALAZINE (APRESOLINE) 25 MG tablet Take 25 mg by mouth 3 (three) times daily.   Yes [provider]  isosorbide mononitrate (IMDUR) 30 MG 24 hr tablet Take 30 mg by mouth every morning.   Yes [provider]  methocarbamol (ROBAXIN) 500 MG tablet Take 1 tablet (500 mg total) by mouth every 6 (six) hours as needed for muscle spasms. 04/18/21  Yes Venetia Night, MD  metoprolol succinate (TOPROL-XL) 25 MG 24 hr tablet Take 25 mg by mouth every morning.   Yes Rockey Situ, NP  sacubitril-valsartan (ENTRESTO) 49-51 MG Take 1 tablet by mouth 2 (two) times daily. 09/04/20  Yes Stevie Charter, Inetta Fermo A, FNP  spironolactone (ALDACTONE) 25 MG tablet Take 0.5 tablets (12.5 mg total) by mouth daily. Patient taking differently: Take 25 mg by mouth every morning. 03/08/20  Yes Lurene Shadow, MD  thiamine 100 MG tablet Take 1 tablet (100 mg total) by mouth daily. 03/08/20  Yes Lurene Shadow, MD    Review of Systems  Constitutional:  Negative for appetite change and fatigue.  HENT:  Positive for rhinorrhea. Negative for congestion and sore throat.   Eyes: Negative.   Respiratory:  Positive for cough (dry cough) and shortness of breath.   Cardiovascular:  Negative for chest pain, palpitations and leg swelling.  Gastrointestinal:  Negative for abdominal distention and abdominal pain.  Endocrine: Negative.   Genitourinary: Negative.   Musculoskeletal:  Negative for back pain and neck pain.  Skin: Negative.   Allergic/Immunologic: Negative.   Neurological:  Negative for dizziness, tremors, syncope, weakness and light-headedness.  Hematological:  Negative for adenopathy. Does not bruise/bleed easily.  Psychiatric/Behavioral:  Negative for dysphoric mood and sleep disturbance (sleeping on 2 pillows). The patient is not nervous/anxious.    Vitals:   06/11/21 1106  BP: 120/73  Pulse: 79  Resp: 16  SpO2: 98%  Weight: 203 lb 2 oz (92.1 kg)  Height: 5\' 10"  (1.778 m)   Wt Readings from  Last 3 Encounters:  06/11/21 203 lb 2 oz (92.1 kg)  04/05/21 210 lb 8.6 oz (95.5 kg)  03/15/21 211 lb 8 oz (95.9 kg)   Lab Results  Component Value Date   CREATININE 1.28 (H) 04/17/2021   CREATININE 1.68 (H) 04/05/2021   CREATININE 1.41 (H) 01/29/2021    Physical Exam Vitals and nursing note reviewed. Exam conducted with a chaperone present (aunt).  Constitutional:      General: He is not in acute distress.    Appearance: Normal appearance. He is well-developed. He is not ill-appearing, toxic-appearing or diaphoretic.  HENT:     Head: Normocephalic and atraumatic.  Neck:     Vascular: No JVD.  Cardiovascular:     Rate and Rhythm: Normal rate and regular rhythm.  Pulmonary:     Effort: Pulmonary effort is normal. No respiratory distress.  Breath sounds: No wheezing, rhonchi or rales.  Abdominal:     Tenderness: There is no abdominal tenderness.  Musculoskeletal:     Cervical back: Normal range of motion and neck supple.     Right lower leg: No edema.     Left lower leg: No edema.  Neurological:     General: No focal deficit present.     Mental Status: He is alert and oriented to person, place, and time.  Psychiatric:        Mood and Affect: Mood normal.        Behavior: Behavior normal.   Assessment & Plan:  1: Chronic heart failure with reduced ejection fraction- - NYHA class II - euvolemic today - weighing every 1-2 days; encouraged to resume weighing daily and reminded to call for an overnight weight gain of >2 pounds or a weekly weight gain of >5 pounds - weight down 8 pounds from last visit here 3 months ago - not adding salt but does rely on others bringing food or eating convenient food - saw cardiology Gwen Pounds) 03/05/21 - on GDMT of dapagliflozin, metoprolol, entresto, spironolactone - filled out [employer] patient assistance renewal forms today; 1 month samples provided as well - consider titrating up entresto or metoprolol at next visit - BNP 02/20/20 was  532.0  2: HTN- - BP looks good today - follows with PCP at [facility])  - BMP on 04/17/21 reviewed and showed sodium 136, potassium 4.5, creatinine 1.28 and GFR >60  3. Atherosclerosis of lower legs- - saw vascular (Schnier) 02/18/21    Medication bottles reviewed.   Return in 4 months or sooner for any questions/problems before then.

## 2021-06-11 ENCOUNTER — Ambulatory Visit: Payer: Medicaid Other | Attending: Family | Admitting: Family

## 2021-06-11 ENCOUNTER — Other Ambulatory Visit: Payer: Self-pay

## 2021-06-11 ENCOUNTER — Encounter: Payer: Self-pay | Admitting: Family

## 2021-06-11 VITALS — BP 120/73 | HR 79 | Resp 16 | Ht 70.0 in | Wt 203.1 lb

## 2021-06-11 DIAGNOSIS — I5022 Chronic systolic (congestive) heart failure: Secondary | ICD-10-CM | POA: Diagnosis not present

## 2021-06-11 DIAGNOSIS — Z86718 Personal history of other venous thrombosis and embolism: Secondary | ICD-10-CM | POA: Insufficient documentation

## 2021-06-11 DIAGNOSIS — I110 Hypertensive heart disease with heart failure: Secondary | ICD-10-CM | POA: Insufficient documentation

## 2021-06-11 DIAGNOSIS — F17210 Nicotine dependence, cigarettes, uncomplicated: Secondary | ICD-10-CM | POA: Diagnosis not present

## 2021-06-11 DIAGNOSIS — I4892 Unspecified atrial flutter: Secondary | ICD-10-CM | POA: Insufficient documentation

## 2021-06-11 DIAGNOSIS — D696 Thrombocytopenia, unspecified: Secondary | ICD-10-CM | POA: Diagnosis not present

## 2021-06-11 DIAGNOSIS — Z79899 Other long term (current) drug therapy: Secondary | ICD-10-CM | POA: Diagnosis not present

## 2021-06-11 DIAGNOSIS — I70229 Atherosclerosis of native arteries of extremities with rest pain, unspecified extremity: Secondary | ICD-10-CM

## 2021-06-11 DIAGNOSIS — Z7984 Long term (current) use of oral hypoglycemic drugs: Secondary | ICD-10-CM | POA: Diagnosis not present

## 2021-06-11 DIAGNOSIS — Z09 Encounter for follow-up examination after completed treatment for conditions other than malignant neoplasm: Secondary | ICD-10-CM | POA: Insufficient documentation

## 2021-06-11 DIAGNOSIS — Z7982 Long term (current) use of aspirin: Secondary | ICD-10-CM | POA: Insufficient documentation

## 2021-06-11 DIAGNOSIS — R059 Cough, unspecified: Secondary | ICD-10-CM | POA: Insufficient documentation

## 2021-06-11 DIAGNOSIS — I10 Essential (primary) hypertension: Secondary | ICD-10-CM

## 2021-06-11 DIAGNOSIS — Z7901 Long term (current) use of anticoagulants: Secondary | ICD-10-CM | POA: Diagnosis not present

## 2021-06-11 DIAGNOSIS — I70213 Atherosclerosis of native arteries of extremities with intermittent claudication, bilateral legs: Secondary | ICD-10-CM | POA: Insufficient documentation

## 2021-06-11 DIAGNOSIS — R0602 Shortness of breath: Secondary | ICD-10-CM | POA: Diagnosis present

## 2021-06-11 NOTE — Patient Instructions (Signed)
Continue weighing daily and call for an overnight weight gain of > 2 pounds or a weekly weight gain of >5 pounds. 

## 2021-06-18 ENCOUNTER — Telehealth (INDEPENDENT_AMBULATORY_CARE_PROVIDER_SITE_OTHER): Payer: Self-pay | Admitting: Vascular Surgery

## 2021-06-18 NOTE — Telephone Encounter (Signed)
Documentation only.

## 2021-06-24 ENCOUNTER — Ambulatory Visit (INDEPENDENT_AMBULATORY_CARE_PROVIDER_SITE_OTHER): Payer: Medicaid Other | Admitting: Vascular Surgery

## 2021-06-24 ENCOUNTER — Other Ambulatory Visit: Payer: Self-pay

## 2021-06-24 ENCOUNTER — Encounter (INDEPENDENT_AMBULATORY_CARE_PROVIDER_SITE_OTHER): Payer: Self-pay | Admitting: Vascular Surgery

## 2021-06-24 VITALS — BP 117/63 | HR 88 | Ht 68.0 in | Wt 204.0 lb

## 2021-06-24 DIAGNOSIS — I6523 Occlusion and stenosis of bilateral carotid arteries: Secondary | ICD-10-CM | POA: Diagnosis not present

## 2021-06-24 DIAGNOSIS — I4892 Unspecified atrial flutter: Secondary | ICD-10-CM

## 2021-06-24 DIAGNOSIS — E782 Mixed hyperlipidemia: Secondary | ICD-10-CM

## 2021-06-24 DIAGNOSIS — I10 Essential (primary) hypertension: Secondary | ICD-10-CM | POA: Diagnosis not present

## 2021-06-24 DIAGNOSIS — I70229 Atherosclerosis of native arteries of extremities with rest pain, unspecified extremity: Secondary | ICD-10-CM

## 2021-06-24 NOTE — Progress Notes (Signed)
MRN : [id number]  Shawn Taylor is a 65 y.o. (Dec 22, 1955) male who presents with chief complaint of  Chief Complaint  Patient presents with   Follow-up    Per gs in to see me he needs a leg angio gram  .  History of Present Illness:  The patient returns to the office for followup and review of the previous angiogram. There has been continued deterioration in his right lower extremity symptoms.  The patient notes interval shortening of their claudication distance and development of rest pain symptoms. No new ulcers or wounds have occurred since the last visit.  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  The patient denies amaurosis fugax or recent TIA symptoms. There are no recent neurological changes noted. The patient denies history of DVT, PE or superficial thrombophlebitis. The patient denies recent episodes of angina or shortness of breath.     Current Meds  Medication Sig   acetaminophen (TYLENOL) 325 MG tablet Take 2 tablets (650 mg total) by mouth every 4 (four) hours as needed for mild pain ((score 1 to 3) or temp > 100.5).   apixaban (ELIQUIS) 5 MG TABS tablet Take 1 tablet (5 mg total) by mouth 2 (two) times daily. (Patient taking differently: Take 5 mg by mouth every 12 (twelve) hours.)   aspirin 81 MG EC tablet Take 81 mg by mouth daily.   atorvastatin (LIPITOR) 40 MG tablet Take 1 tablet (40 mg total) by mouth daily at 10 pm.   dapagliflozin propanediol (FARXIGA) 10 MG TABS tablet Take 10 mg by mouth daily. TAKING FOR HIS HEART   folic acid (FOLVITE) 1 MG tablet Take 1 mg by mouth daily with lunch.   furosemide (LASIX) 40 MG tablet Take 20 mg by mouth every morning.   gabapentin (NEURONTIN) 300 MG capsule TAKE ONE CAPSULE BY MOUTH AT BEDTIME FOR PAIN (Patient taking differently: Take 300 mg by mouth at bedtime.)   hydrALAZINE (APRESOLINE) 25 MG tablet Take 25  mg by mouth 3 (three) times daily.   isosorbide mononitrate (IMDUR) 30 MG 24 hr tablet Take 30 mg by mouth every morning.   methocarbamol (ROBAXIN) 500 MG tablet Take 1 tablet (500 mg total) by mouth every 6 (six) hours as needed for muscle spasms.   metoprolol succinate (TOPROL-XL) 25 MG 24 hr tablet Take 25 mg by mouth every morning.   sacubitril-valsartan (ENTRESTO) 49-51 MG Take 1 tablet by mouth 2 (two) times daily.   spironolactone (ALDACTONE) 25 MG tablet Take 0.5 tablets (12.5 mg total) by mouth daily. (Patient taking differently: Take 25 mg by mouth every morning.)   thiamine 100 MG tablet Take 1 tablet (100 mg total) by mouth daily.    Past Medical History:  Diagnosis Date   Atrial flutter (HCC)    CAD (coronary artery disease)    Cardiac arrest (HCC) 02/12/2020   WITH HIS MI   Carotid artery occlusion    Cervical myelopathy (HCC)    CHF (congestive heart failure) (HCC)    DOE (dyspnea on exertion)    DVT (deep venous thrombosis) (HCC)    HCV (hepatitis C virus) 2019   Tx'd with Harvoni course   HLD (hyperlipidemia)    Hypertension    Migraines    Myocardial infarction (HCC) 02/12/2020   PAD (peripheral artery disease) (HCC)    Polysubstance abuse (HCC)    Cocaine and  ETOH   Thrombocytopenia (HCC)     Past Surgical History:  Procedure Laterality Date   ANTERIOR CERVICAL CORPECTOMY N/A 04/17/2021   Procedure: ANTERIOR CERVICAL CORPECTOMY C4;  Surgeon: Venetia Night, MD;  Location: [facility];  Service: Neurosurgery;  Laterality: N/A;  2nd case   ANTERIOR CERVICAL DECOMP/DISCECTOMY FUSION N/A 04/17/2021   Procedure: C5-6 ANTERIOR CERVICAL DECOMPRESSION/DISCECTOMY FUSION;  Surgeon: Venetia Night, MD;  Location: [facility];  Service: Neurosurgery;  Laterality: N/A;   LOWER EXTREMITY ANGIOGRAPHY Right 01/29/2021   Procedure: LOWER EXTREMITY ANGIOGRAPHY;  Surgeon: Renford Dills, MD;  Location: [facility];  Service: Cardiovascular;  Laterality: Right;     Social History Social History   Tobacco Use   Smoking status: Every Day    Packs/day: 0.25    Years: 55.00    Pack years: 13.75    Types: Cigarettes   Smokeless tobacco: Never  Substance Use Topics   Alcohol use: Not Currently    Alcohol/week: 2.0 - 3.0 standard drinks    Types: 2 - 3 Cans of beer per week   Drug use: Not Currently    Frequency: 1.0 times per week    Types: Cocaine    Comment: PT DENIES DURING ANESTHESIA INTERVIEW ON 04-05-21    Family History Family History  Problem Relation Age of Onset   Diabetes Sister     No Known Allergies   REVIEW OF SYSTEMS (Negative unless checked)  Constitutional: [] Weight loss  [] Fever  [] Chills Cardiac: [] Chest pain   [] Chest pressure   [] Palpitations   [] Shortness of breath when laying flat   [] Shortness of breath with exertion. Vascular:  [x] Pain in legs with walking   [x] Pain in legs at rest  [] History of DVT   [] Phlebitis   [] Swelling in legs   [] Varicose veins   [] Non-healing ulcers Pulmonary:   [] Uses home oxygen   [] Productive cough   [] Hemoptysis   [] Wheeze  [] COPD   [] Asthma Neurologic:  [] Dizziness   [] Seizures   [] History of stroke   [] History of TIA  [] Aphasia   [] Vissual changes   [] Weakness or numbness in arm   [] Weakness or numbness in leg Musculoskeletal:   [] Joint swelling   [x] Joint pain   [x] Back pain Hematologic:  [] Easy bruising  [] Easy bleeding   [] Hypercoagulable state   [] Anemic Gastrointestinal:  [] Diarrhea   [] Vomiting  [] Gastroesophageal reflux/heartburn   [] Difficulty swallowing. Genitourinary:  [] Chronic kidney disease   [] Difficult urination  [] Frequent urination   [] Blood in urine Skin:  [] Rashes   [] Ulcers  Psychological:  [] History of anxiety   []  History of major depression.  Physical Examination  Vitals:   06/24/21 0903  BP: 117/63  Pulse: 88  Weight: 204 lb (92.5 kg)  Height: 5\' 8"  (1.727 m)   Body mass index is 31.02 kg/m. Gen: WD/WN, NAD Head: [place]/AT, No temporalis wasting.   Ear/Nose/Throat: Hearing grossly intact, nares w/o erythema or drainage Eyes: PER, EOMI, sclera nonicteric.  Neck: Supple, no large masses.   Pulmonary:  Good air movement, no audible wheezing bilaterally, no use of accessory muscles.  Cardiac: RRR, no JVD Vascular:  Vessel Right Left  Radial Palpable Palpable  PT Not Palpable Not Palpable  DP Not Palpable Not Palpable  Gastrointestinal: Non-distended. No guarding/no peritoneal signs.  Musculoskeletal: M/S 5/5 throughout.  No deformity or atrophy.  Neurologic: CN 2-12 intact. Symmetrical.  Speech is fluent. Motor exam as listed above. Psychiatric: Judgment intact, Mood & affect appropriate for pt's clinical situation. Dermatologic: No rashes  or ulcers noted.  No changes consistent with cellulitis.   CBC Lab Results  Component Value Date   WBC 15.2 (H) 04/17/2021   HGB 13.4 04/17/2021   HCT 42.4 04/17/2021   MCV 91.8 04/17/2021   PLT 300 04/17/2021    BMET    Component Value Date/Time   NA 136 04/17/2021 1641   K 4.5 04/17/2021 1641   CL 106 04/17/2021 1641   CO2 19 (L) 04/17/2021 1641   GLUCOSE 153 (H) 04/17/2021 1641   BUN 25 (H) 04/17/2021 1641   CREATININE 1.28 (H) 04/17/2021 1641   CALCIUM 9.0 04/17/2021 1641   GFRNONAA >60 04/17/2021 1641   GFRAA >60 05/29/2020 1254   CrCl cannot be calculated (Patient's most recent lab result is older than the maximum 21 days allowed.).  COAG Lab Results  Component Value Date   INR 1.1 04/05/2021   INR 2.3 (H) 02/14/2020    Radiology No results found.   Assessment/Plan 1. 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  This represents a limb threatening ischemia and places the patient at the risk for right lower limb loss.  Angiography has been performed and the situation is not  ideal for intervention.  Given this finding open surgical repair is recommended.   Patient should undergo arterial reconstruction of the lower extremity with the hope for limb salvage.  The risks and benefits as well as the alternative therapies was discussed in detail with the patient.  All questions were answered.  Patient agrees to proceed with right femoral endarterectomy with superficial femoral artery and popliteal artery intervention.  If for whatever reason the SFA and popliteal cannot be treated endovascularly then consideration for a femoral below-knee popliteal will be made and this was discussed with the patient.  He sees Dr. Gwen Pounds and I will ask for Dr. Gwen Pounds to clear him from a cardiac standpoint for anesthesia as it will require general anesthesia.  The patient will follow up with me in the office after the procedure.     2. Bilateral carotid artery stenosis Recommend:   Given the patient's asymptomatic subcritical stenosis no further invasive testing or surgery at this time.   Continue antiplatelet therapy as prescribed Continue management of CAD, HTN and Hyperlipidemia Healthy heart diet,  encouraged exercise at least 4 times per week Follow up in 1 month with duplex ultrasound and physical exam   3. Primary hypertension Continue antihypertensive medications as already ordered, these medications have been reviewed and there are no changes at this time.   4. Mixed hyperlipidemia Continue statin as ordered and reviewed, no changes at this time   5. Atrial flutter, unspecified type (HCC) Continue antiarrhythmia medications as already ordered, these medications have been reviewed and there are no changes at this time.  Continue anticoagulation as ordered by Cardiology Service    Levora Dredge, MD  06/24/2021 9:09 AM

## 2021-06-24 NOTE — H&P (View-Only) (Signed)
MRN : [id number]  Shawn Taylor is a 65 y.o. (Dec 22, 1955) male who presents with chief complaint of  Chief Complaint  Patient presents with   Follow-up    Per gs in to see me he needs a leg angio gram  .  History of Present Illness:  The patient returns to the office for followup and review of the previous angiogram. There has been continued deterioration in his right lower extremity symptoms.  The patient notes interval shortening of their claudication distance and development of rest pain symptoms. No new ulcers or wounds have occurred since the last visit.  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  The patient denies amaurosis fugax or recent TIA symptoms. There are no recent neurological changes noted. The patient denies history of DVT, PE or superficial thrombophlebitis. The patient denies recent episodes of angina or shortness of breath.     Current Meds  Medication Sig   acetaminophen (TYLENOL) 325 MG tablet Take 2 tablets (650 mg total) by mouth every 4 (four) hours as needed for mild pain ((score 1 to 3) or temp > 100.5).   apixaban (ELIQUIS) 5 MG TABS tablet Take 1 tablet (5 mg total) by mouth 2 (two) times daily. (Patient taking differently: Take 5 mg by mouth every 12 (twelve) hours.)   aspirin 81 MG EC tablet Take 81 mg by mouth daily.   atorvastatin (LIPITOR) 40 MG tablet Take 1 tablet (40 mg total) by mouth daily at 10 pm.   dapagliflozin propanediol (FARXIGA) 10 MG TABS tablet Take 10 mg by mouth daily. TAKING FOR HIS HEART   folic acid (FOLVITE) 1 MG tablet Take 1 mg by mouth daily with lunch.   furosemide (LASIX) 40 MG tablet Take 20 mg by mouth every morning.   gabapentin (NEURONTIN) 300 MG capsule TAKE ONE CAPSULE BY MOUTH AT BEDTIME FOR PAIN (Patient taking differently: Take 300 mg by mouth at bedtime.)   hydrALAZINE (APRESOLINE) 25 MG tablet Take 25  mg by mouth 3 (three) times daily.   isosorbide mononitrate (IMDUR) 30 MG 24 hr tablet Take 30 mg by mouth every morning.   methocarbamol (ROBAXIN) 500 MG tablet Take 1 tablet (500 mg total) by mouth every 6 (six) hours as needed for muscle spasms.   metoprolol succinate (TOPROL-XL) 25 MG 24 hr tablet Take 25 mg by mouth every morning.   sacubitril-valsartan (ENTRESTO) 49-51 MG Take 1 tablet by mouth 2 (two) times daily.   spironolactone (ALDACTONE) 25 MG tablet Take 0.5 tablets (12.5 mg total) by mouth daily. (Patient taking differently: Take 25 mg by mouth every morning.)   thiamine 100 MG tablet Take 1 tablet (100 mg total) by mouth daily.    Past Medical History:  Diagnosis Date   Atrial flutter (HCC)    CAD (coronary artery disease)    Cardiac arrest (HCC) 02/12/2020   WITH HIS MI   Carotid artery occlusion    Cervical myelopathy (HCC)    CHF (congestive heart failure) (HCC)    DOE (dyspnea on exertion)    DVT (deep venous thrombosis) (HCC)    HCV (hepatitis C virus) 2019   Tx'd with Harvoni course   HLD (hyperlipidemia)    Hypertension    Migraines    Myocardial infarction (HCC) 02/12/2020   PAD (peripheral artery disease) (HCC)    Polysubstance abuse (HCC)    Cocaine and  ETOH   Thrombocytopenia (HCC)     Past Surgical History:  Procedure Laterality Date   ANTERIOR CERVICAL CORPECTOMY N/A 04/17/2021   Procedure: ANTERIOR CERVICAL CORPECTOMY C4;  Surgeon: Venetia Night, MD;  Location: [facility];  Service: Neurosurgery;  Laterality: N/A;  2nd case   ANTERIOR CERVICAL DECOMP/DISCECTOMY FUSION N/A 04/17/2021   Procedure: C5-6 ANTERIOR CERVICAL DECOMPRESSION/DISCECTOMY FUSION;  Surgeon: Venetia Night, MD;  Location: [facility];  Service: Neurosurgery;  Laterality: N/A;   LOWER EXTREMITY ANGIOGRAPHY Right 01/29/2021   Procedure: LOWER EXTREMITY ANGIOGRAPHY;  Surgeon: Renford Dills, MD;  Location: [facility];  Service: Cardiovascular;  Laterality: Right;     Social History Social History   Tobacco Use   Smoking status: Every Day    Packs/day: 0.25    Years: 55.00    Pack years: 13.75    Types: Cigarettes   Smokeless tobacco: Never  Substance Use Topics   Alcohol use: Not Currently    Alcohol/week: 2.0 - 3.0 standard drinks    Types: 2 - 3 Cans of beer per week   Drug use: Not Currently    Frequency: 1.0 times per week    Types: Cocaine    Comment: PT DENIES DURING ANESTHESIA INTERVIEW ON 04-05-21    Family History Family History  Problem Relation Age of Onset   Diabetes Sister     No Known Allergies   REVIEW OF SYSTEMS (Negative unless checked)  Constitutional: [] Weight loss  [] Fever  [] Chills Cardiac: [] Chest pain   [] Chest pressure   [] Palpitations   [] Shortness of breath when laying flat   [] Shortness of breath with exertion. Vascular:  [x] Pain in legs with walking   [x] Pain in legs at rest  [] History of DVT   [] Phlebitis   [] Swelling in legs   [] Varicose veins   [] Non-healing ulcers Pulmonary:   [] Uses home oxygen   [] Productive cough   [] Hemoptysis   [] Wheeze  [] COPD   [] Asthma Neurologic:  [] Dizziness   [] Seizures   [] History of stroke   [] History of TIA  [] Aphasia   [] Vissual changes   [] Weakness or numbness in arm   [] Weakness or numbness in leg Musculoskeletal:   [] Joint swelling   [x] Joint pain   [x] Back pain Hematologic:  [] Easy bruising  [] Easy bleeding   [] Hypercoagulable state   [] Anemic Gastrointestinal:  [] Diarrhea   [] Vomiting  [] Gastroesophageal reflux/heartburn   [] Difficulty swallowing. Genitourinary:  [] Chronic kidney disease   [] Difficult urination  [] Frequent urination   [] Blood in urine Skin:  [] Rashes   [] Ulcers  Psychological:  [] History of anxiety   []  History of major depression.  Physical Examination  Vitals:   06/24/21 0903  BP: 117/63  Pulse: 88  Weight: 204 lb (92.5 kg)  Height: 5\' 8"  (1.727 m)   Body mass index is 31.02 kg/m. Gen: WD/WN, NAD Head: [place]/AT, No temporalis wasting.   Ear/Nose/Throat: Hearing grossly intact, nares w/o erythema or drainage Eyes: PER, EOMI, sclera nonicteric.  Neck: Supple, no large masses.   Pulmonary:  Good air movement, no audible wheezing bilaterally, no use of accessory muscles.  Cardiac: RRR, no JVD Vascular:  Vessel Right Left  Radial Palpable Palpable  PT Not Palpable Not Palpable  DP Not Palpable Not Palpable  Gastrointestinal: Non-distended. No guarding/no peritoneal signs.  Musculoskeletal: M/S 5/5 throughout.  No deformity or atrophy.  Neurologic: CN 2-12 intact. Symmetrical.  Speech is fluent. Motor exam as listed above. Psychiatric: Judgment intact, Mood & affect appropriate for pt's clinical situation. Dermatologic: No rashes  or ulcers noted.  No changes consistent with cellulitis.   CBC Lab Results  Component Value Date   WBC 15.2 (H) 04/17/2021   HGB 13.4 04/17/2021   HCT 42.4 04/17/2021   MCV 91.8 04/17/2021   PLT 300 04/17/2021    BMET    Component Value Date/Time   NA 136 04/17/2021 1641   K 4.5 04/17/2021 1641   CL 106 04/17/2021 1641   CO2 19 (L) 04/17/2021 1641   GLUCOSE 153 (H) 04/17/2021 1641   BUN 25 (H) 04/17/2021 1641   CREATININE 1.28 (H) 04/17/2021 1641   CALCIUM 9.0 04/17/2021 1641   GFRNONAA >60 04/17/2021 1641   GFRAA >60 05/29/2020 1254   CrCl cannot be calculated (Patient's most recent lab result is older than the maximum 21 days allowed.).  COAG Lab Results  Component Value Date   INR 1.1 04/05/2021   INR 2.3 (H) 02/14/2020    Radiology No results found.   Assessment/Plan 1. 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  This represents a limb threatening ischemia and places the patient at the risk for right lower limb loss.  Angiography has been performed and the situation is not  ideal for intervention.  Given this finding open surgical repair is recommended.   Patient should undergo arterial reconstruction of the lower extremity with the hope for limb salvage.  The risks and benefits as well as the alternative therapies was discussed in detail with the patient.  All questions were answered.  Patient agrees to proceed with right femoral endarterectomy with superficial femoral artery and popliteal artery intervention.  If for whatever reason the SFA and popliteal cannot be treated endovascularly then consideration for a femoral below-knee popliteal will be made and this was discussed with the patient.  He sees Dr. Gwen Pounds and I will ask for Dr. Gwen Pounds to clear him from a cardiac standpoint for anesthesia as it will require general anesthesia.  The patient will follow up with me in the office after the procedure.     2. Bilateral carotid artery stenosis Recommend:   Given the patient's asymptomatic subcritical stenosis no further invasive testing or surgery at this time.   Continue antiplatelet therapy as prescribed Continue management of CAD, HTN and Hyperlipidemia Healthy heart diet,  encouraged exercise at least 4 times per week Follow up in 1 month with duplex ultrasound and physical exam   3. Primary hypertension Continue antihypertensive medications as already ordered, these medications have been reviewed and there are no changes at this time.   4. Mixed hyperlipidemia Continue statin as ordered and reviewed, no changes at this time   5. Atrial flutter, unspecified type (HCC) Continue antiarrhythmia medications as already ordered, these medications have been reviewed and there are no changes at this time.  Continue anticoagulation as ordered by Cardiology Service    Levora Dredge, MD  06/24/2021 9:09 AM

## 2021-06-25 ENCOUNTER — Other Ambulatory Visit: Payer: Self-pay | Admitting: Family

## 2021-06-25 MED ORDER — SPIRONOLACTONE 25 MG PO TABS: 25.0000 mg | ORAL_TABLET | ORAL | 3 refills | Status: DC

## 2021-06-25 NOTE — Progress Notes (Signed)
Refilled spironolactone

## 2021-07-10 ENCOUNTER — Encounter (INDEPENDENT_AMBULATORY_CARE_PROVIDER_SITE_OTHER): Payer: Self-pay

## 2021-07-16 ENCOUNTER — Other Ambulatory Visit (INDEPENDENT_AMBULATORY_CARE_PROVIDER_SITE_OTHER): Payer: Self-pay | Admitting: Nurse Practitioner

## 2021-07-16 ENCOUNTER — Other Ambulatory Visit: Payer: Self-pay

## 2021-07-16 ENCOUNTER — Encounter
Admission: RE | Admit: 2021-07-16 | Discharge: 2021-07-16 | Disposition: A | Discharge status: Home / Self Care | Payer: Medicaid Other | Source: Ambulatory Visit | Attending: Vascular Surgery | Admitting: Vascular Surgery

## 2021-07-16 NOTE — Patient Instructions (Addendum)
Your procedure is scheduled on: Wednesday, August 31 Report to the Registration Desk on the 1st floor of the [facility] To find out your arrival time, please call [phone] between 1PM - 3PM on: Tuesday, August 30  REMEMBER: Instructions that are not followed completely may result in serious medical risk, up to and including death; or upon the discretion of your surgeon and anesthesiologist your surgery may need to be rescheduled.  Do not eat food after midnight the night before surgery.  No gum chewing, lozengers or hard candies.  You may however, drink CLEAR liquids up to 2 hours before you are scheduled to arrive for your surgery. Do not drink anything within 2 hours of your scheduled arrival time.  Clear liquids include: - water  - apple juice without pulp - gatorade (not RED, PURPLE, OR BLUE) - black coffee or tea (Do NOT add milk or creamers to the coffee or tea) Do NOT drink anything that is not on this list.  TAKE THESE MEDICATIONS THE MORNING OF SURGERY WITH A SIP OF WATER:  Hydralazine Isosorbide mononitrate (Imdur) Metoprolol  Follow recommendations from Cardiologist, Pulmonologist or PCP regarding stopping Aspirin and Eliquis. Per Dr. Philemon Kingdom note; stop Eliquis 3 days prior to surgery. Last day to take Eliquis is Saturday, August 27. Resume AFTER surgery per surgeons instruction. Continue taking the 81 mg aspirin up until the day of surgery. Do not take Aspirin on the day of surgery.  One week prior to surgery: starting August 24 Stop Anti-inflammatories (NSAIDS) such as Advil, Aleve, Ibuprofen, Motrin, Naproxen, Naprosyn and Aspirin based products such as Excedrin, Goodys Powder, BC Powder. Stop ANY OVER THE COUNTER supplements until after surgery. You may however, continue to take Tylenol if needed for pain up until the day of surgery.  No Alcohol for 24 hours before or after surgery.  No Smoking including e-cigarettes for 24 hours prior to surgery.  No  chewable tobacco products for at least 6 hours prior to surgery.  No nicotine patches on the day of surgery.  Do not use any "recreational" drugs for at least a week prior to your surgery.  Please be advised that the combination of cocaine and anesthesia may have negative outcomes, up to and including death. If you test positive for cocaine, your surgery will be cancelled.  On the morning of surgery brush your teeth with toothpaste and water, you may rinse your mouth with mouthwash if you wish. Do not swallow any toothpaste or mouthwash.  Do not wear jewelry, make-up, hairpins, clips or nail polish.  Do not wear lotions, powders, or perfumes.   Do not shave body from the neck down 48 hours prior to surgery just in case you cut yourself which could leave a site for infection.  Also, freshly shaved skin may become irritated if using the CHG soap.  Contact lenses, hearing aids and dentures may not be worn into surgery.  Do not bring valuables to the hospital. [facility] is not responsible for any missing/lost belongings or valuables.   Use CHG Soap or wipes as directed on instruction sheet.  Notify your doctor if there is any change in your medical condition (cold, fever, infection).  Wear comfortable clothing (specific to your surgery type) to the hospital.  After surgery, you can help prevent lung complications by doing breathing exercises.  Take deep breaths and cough every 1-2 hours. Your doctor may order a device called an Incentive Spirometer to help you take deep breaths.  If you  are being admitted to the hospital overnight, leave your suitcase in the car. After surgery it may be brought to your room.  Please call the Pre-admissions Testing Dept. at [phone] if you have any questions about these instructions.  Surgery Visitation Policy:  Patients undergoing a surgery or procedure may have one family member or support person with them as long as that person is not  COVID-19 positive or experiencing its symptoms.  That person may remain in the waiting area during the procedure.  Inpatient Visitation:    Visiting hours are 7 a.m. to 8 p.m. Inpatients will be allowed two visitors daily. The visitors may change each day during the patient's stay. No visitors under the age of 56. Any visitor under the age of 47 must be accompanied by an adult. The visitor must pass COVID-19 screenings, use hand sanitizer when entering and exiting the patient's room and wear a mask at all times, including in the patient's room. Patients must also wear a mask when staff or their visitor are in the room. Masking is required regardless of vaccination status.

## 2021-07-18 ENCOUNTER — Encounter: Payer: Self-pay | Admitting: Urgent Care

## 2021-07-18 ENCOUNTER — Encounter
Admission: RE | Admit: 2021-07-18 | Discharge: 2021-07-18 | Disposition: A | Discharge status: Home / Self Care | Payer: Medicaid Other | Source: Ambulatory Visit | Attending: Vascular Surgery | Admitting: Vascular Surgery

## 2021-07-18 ENCOUNTER — Other Ambulatory Visit: Payer: Self-pay

## 2021-07-18 DIAGNOSIS — Z01818 Encounter for other preprocedural examination: Secondary | ICD-10-CM | POA: Insufficient documentation

## 2021-07-18 LAB — CBC WITH DIFFERENTIAL/PLATELET
Abs Immature Granulocytes: 0.17 10*3/uL — ABNORMAL HIGH (ref 0.00–0.07)
Basophils Absolute: 0.1 10*3/uL (ref 0.0–0.1)
Basophils Relative: 1 %
Eosinophils Absolute: 0.2 10*3/uL (ref 0.0–0.5)
Eosinophils Relative: 2 %
HCT: 43.3 % (ref 39.0–52.0)
Hemoglobin: 14.2 g/dL (ref 13.0–17.0)
Immature Granulocytes: 2 %
Lymphocytes Relative: 20 %
Lymphs Abs: 2.2 10*3/uL (ref 0.7–4.0)
MCH: 28.9 pg (ref 26.0–34.0)
MCHC: 32.8 g/dL (ref 30.0–36.0)
MCV: 88 fL (ref 80.0–100.0)
Monocytes Absolute: 0.8 10*3/uL (ref 0.1–1.0)
Monocytes Relative: 7 %
Neutro Abs: 7.7 10*3/uL (ref 1.7–7.7)
Neutrophils Relative %: 68 %
Platelets: 346 10*3/uL (ref 150–400)
RBC: 4.92 MIL/uL (ref 4.22–5.81)
RDW: 14.2 % (ref 11.5–15.5)
WBC: 11 10*3/uL — ABNORMAL HIGH (ref 4.0–10.5)
nRBC: 0 % (ref 0.0–0.2)

## 2021-07-18 LAB — BASIC METABOLIC PANEL
Anion gap: 6 (ref 5–15)
BUN: 27 mg/dL — ABNORMAL HIGH (ref 8–23)
CO2: 21 mmol/L — ABNORMAL LOW (ref 22–32)
Calcium: 9.3 mg/dL (ref 8.9–10.3)
Chloride: 106 mmol/L (ref 98–111)
Creatinine, Ser: 1.3 mg/dL — ABNORMAL HIGH (ref 0.61–1.24)
GFR, Estimated: >60 mL/min (ref >60)
Glucose, Bld: 143 mg/dL — ABNORMAL HIGH (ref 70–99)
Potassium: 4.7 mmol/L (ref 3.5–5.1)
Sodium: 133 mmol/L — ABNORMAL LOW (ref 135–145)

## 2021-07-18 LAB — TYPE AND SCREEN
ABO/RH(D): B POS
Antibody Screen: NEGATIVE

## 2021-07-18 LAB — SURGICAL PCR SCREEN
MRSA, PCR: NEGATIVE
Staphylococcus aureus: NEGATIVE

## 2021-07-19 ENCOUNTER — Encounter: Payer: Self-pay | Admitting: Vascular Surgery

## 2021-07-19 NOTE — Progress Notes (Signed)
Perioperative Services  Pre-Admission/Anesthesia Testing Clinical Review  Date: 07/19/21  Patient Demographics:  Name: Shawn Taylor DOB:   [birth year] MRN:   [id number]  Planned Surgical Procedure(s):    Case: [id number] Date/Time: 07/24/21 0929   Procedures:      ENDARTERECTOMY FEMORAL WITH STENT PLACEMENT (Right) - Femoral endarterectomy with stent     APPLICATION OF CELL SAVER   Anesthesia type: General   Pre-op diagnosis: I70.229 Atherosclerosis with rest pain   Location: [facility] OR ROOM 08 / [facility]   Surgeons: Renford Dills, MD     NOTE: Available PAT nursing documentation and vital signs have been reviewed. Clinical nursing staff has updated patient's PMH/PSHx, current medication list, and drug allergies/intolerances to ensure comprehensive history available to assist in medical decision making as it pertains to the aforementioned surgical procedure and anticipated anesthetic course. Extensive review of available clinical information performed. [place] PMH and PSHx updated with any diagnoses/procedures that  may have been inadvertently omitted during his intake with the pre-admission testing department's nursing staff.  Clinical Discussion:  Shawn Taylor is a 65 y.o. adult who is submitted for pre-surgical anesthesia review and clearance prior to him undergoing the above procedure.  Patient is a Current Smoker (13.75 pack years). Pertinent PMH includes: CAD, MI, cardiac arrest, atrial flutter, HFrEF, PAD, carotid artery stenosis, DVT, HTN, HLD, DOE, HCV (s/p Harvoni course), polysubstance abuse (ETOH and cocaine).   Patient is followed by cardiology Gwen Pounds, MD). He was last seen in the cardiology clinic on 07/04/2021; notes reviewed.  At the time of his clinic visit, patient doing fairly well overall from a cardiovascular perspective.  He denied chest pain, however complained of chronic exertional dyspnea related to his underlying CHF diagnosis.   Patient also experiencing claudication pain with ambulation in the setting of known PAD.  Patient with history of significant CV events:  He was seen in the ED on 02/13/2020 for progressive worsening shortness of breath and chest pressure x 3-4 weeks.  During this time of the ED, patient with atypical atrial flutter and respiratory failure leading to cardiopulmonary arrest; CODE BLUE was called.  CPR and intubation performed; ROSC achieved.  Post arrest, patient found to be in new onset atrial flutter with RVR.  Patient converted to NSR with diltiazem and amiodarone drip. He was admitted to ICU where he developed severe metabolic acidosis and pulmonary edema. Admission complicated by the development of multisystem organ failure. Bilateral lower extremity Dopplers revealed multiple occlusive DVTs in the patient's BILATERAL lower extremities. TTE performed on 02/14/2020 revealed severely decreased left ventricular systolic function with an EF <20%.  Right ventricular systolic function also severely reduced.  There was mild to moderate valvular insufficiency noted on exam. Patient was difficult to wean from the vent following respiratory failure event; finally extubated on 02/21/2020.  Condition continued to improve following extubation.  Patient was discharged home in stable condition on 03/07/2020.   Myocardial perfusion imaging study performed on 03/04/2021 revealed no evidence of stress-induced myocardial ischemia or arrhythmia; LVEF 50% (see full interpretation of cardiovascular testing below).  Patient with atypical atrial fibrillation/flutter diagnosis. CHA2DS2-VASc Score = 3 (HFrEF, HTN, previous MI).  Patient with chronically anticoagulated using daily apixaban and low-dose ASA; compliant therapy with no evidence of GI bleeding. Rate and rhythm controlled on prescribed beta blocker monotherapy. HFrEF and blood pressure well controlled currently prescribed nitrate, beta blocker, vasodilator, diuretic, and  ARB/ARNI therapies; blood pressure 98/62. Patient is on a statin for  his HLD. Functional capacity, as defined by DASI, is documented as being </= 4 METS. No other changes were made to his medication regimen.  Patient follows up outpatient cardiology in 4 months or sooner if needed.   Patient was scheduled to undergo an RIGHT femoral endarterectomy and placement on 07/25/2019 reviewed with Dr. Levora Dredge, MD. Given patient's past medical history significant for cardiovascular diagnoses, presurgical cardiac clearance was sought by the performing surgeon's office and PAT team.  "The patient is at the lowest risk possible for perioperative cardiovascular complications with the planned procedure.  The overall risk his procedure is low (<1%).  Currently has no evidence active and/or significant angina and/or congestive heart failure. Patient may proceed to surgery without restriction or need for further cardiovascular testing and an overall LOW risk". Again, this patient is on daily anticoagulation therapy. He has been instructed on recommendations for holding his apixaban for 3 days prior to his procedure with plans to restart as soon as postoperative bleeding risk felt to be minimized by primary attending surgeon.  Patient will continue his daily low-dose ASA throughout the perioperative period.  Shawn Taylor is aware that his last dose of apixaban will be on 07/20/2021.  Patient denies previous perioperative complications with anesthesia in the past. In review of the available records, it is noted that patient underwent a general anesthetic course here (ASA III) in 03/2021 without documented complications.   Vitals with BMI 07/16/2021 06/24/2021 06/11/2021  Height 5\' 8"  5\' 8"  5\' 10"   Weight 202 lbs 204 lbs 203 lbs 2 oz  BMI 30.72 31.03 29.15  Systolic - 117 120  Diastolic - 63 73  Pulse - 88 79    Providers/Specialists:   NOTE: Primary physician provider listed below. Patient may have been seen by APP or  partner within same practice.   PROVIDER ROLE / SPECIALTY LAST OV  Schnier, , MD Vascular Surgery 06/24/2021  Revelo, , MD Primary Care Provider ???  Latina Craver, MD Cardiology 07/04/2021   Allergies:  Patient has no known allergies.  Current Home Medications:   No current facility-administered medications for this encounter.    apixaban (ELIQUIS) 5 MG TABS tablet   aspirin 81 MG EC tablet   atorvastatin (LIPITOR) 40 MG tablet   dapagliflozin propanediol (FARXIGA) 10 MG TABS tablet   furosemide (LASIX) 40 MG tablet   gabapentin (NEURONTIN) 300 MG capsule   hydrALAZINE (APRESOLINE) 25 MG tablet   isosorbide mononitrate (IMDUR) 30 MG 24 hr tablet   metoprolol succinate (TOPROL-XL) 25 MG 24 hr tablet   sacubitril-valsartan (ENTRESTO) 49-51 MG   spironolactone (ALDACTONE) 25 MG tablet   thiamine 100 MG tablet   acetaminophen (TYLENOL) 500 MG tablet   History:   Past Medical History:  Diagnosis Date   Atrial flutter (HCC)    CAD (coronary artery disease)    Cardiac arrest (HCC) 02/12/2020   WITH HIS MI   Carotid artery occlusion    Cervical myelopathy (HCC)    CHF (congestive heart failure) (HCC)    Chronic anticoagulation    Apixaban + ASA   DOE (dyspnea on exertion)    DVT (deep venous thrombosis) (HCC)    HCV (hepatitis C virus) 2019   Tx'd with Harvoni course   HLD (hyperlipidemia)    Hypertension    Migraines    Myocardial infarction (HCC) 02/12/2020   PAD (peripheral artery disease) (HCC)    Polysubstance abuse (HCC)    Cocaine and ETOH   Thrombocytopenia (HCC)  Past Surgical History:  Procedure Laterality Date   ANTERIOR CERVICAL CORPECTOMY N/A 04/17/2021   Procedure: ANTERIOR CERVICAL CORPECTOMY C4;  Surgeon: Venetia Night, MD;  Location: [facility] ORS;  Service: Neurosurgery;  Laterality: N/A;  2nd case   ANTERIOR CERVICAL DECOMP/DISCECTOMY FUSION N/A 04/17/2021   Procedure: C5-6 ANTERIOR CERVICAL DECOMPRESSION/DISCECTOMY  FUSION;  Surgeon: Venetia Night, MD;  Location: [facility] ORS;  Service: Neurosurgery;  Laterality: N/A;   LOWER EXTREMITY ANGIOGRAPHY Right 01/29/2021   Procedure: LOWER EXTREMITY ANGIOGRAPHY;  Surgeon: Renford Dills, MD;  Location: [facility] INVASIVE CV LAB;  Service: Cardiovascular;  Laterality: Right;   Family History  Problem Relation Age of Onset   Diabetes Sister    Social History   Tobacco Use   Smoking status: Every Day    Packs/day: 0.25    Years: 55.00    Pack years: 13.75    Types: Cigarettes   Smokeless tobacco: Never  Vaping Use   Vaping Use: Never used  Substance Use Topics   Alcohol use: Not Currently    Alcohol/week: 2.0 - 3.0 standard drinks    Types: 2 - 3 Cans of beer per week   Drug use: Not Currently    Frequency: 1.0 times per week    Types: Cocaine    Comment: last documented + cocaine UDS was 10/2016    Pertinent Clinical Results:  LABS: Labs reviewed: Acceptable for surgery.  Hospital Outpatient Visit on 07/18/2021  Component Date Value Ref Range Status   MRSA, PCR 07/18/2021 NEGATIVE  NEGATIVE Final   Staphylococcus aureus 07/18/2021 NEGATIVE  NEGATIVE Final   Comment: (NOTE) The Xpert SA Assay (FDA approved for NASAL specimens in patients 109 years of age and older), is one component of a comprehensive surveillance program. It is not intended to diagnose infection nor to guide or monitor treatment. Performed at [facility], [address] [place], [place] [address]    WBC 07/18/2021 11.0 (A) 4.0 - 10.5 K/uL Final   RBC 07/18/2021 4.92  4.22 - 5.81 MIL/uL Final   Hemoglobin 07/18/2021 14.2  13.0 - 17.0 g/dL Final   HCT 60/45/4098 43.3  39.0 - 52.0 % Final   MCV 07/18/2021 88.0  80.0 - 100.0 fL Final   MCH 07/18/2021 28.9  26.0 - 34.0 pg Final   MCHC 07/18/2021 32.8  30.0 - 36.0 g/dL Final   RDW 11/91/4782 14.2  11.5 - 15.5 % Final   Platelets 07/18/2021 346  150 - 400 K/uL Final   nRBC 07/18/2021 0.0  0.0 - 0.2 % Final    Neutrophils Relative % 07/18/2021 68  % Final   Neutro Abs 07/18/2021 7.7  1.7 - 7.7 K/uL Final   Lymphocytes Relative 07/18/2021 20  % Final   Lymphs Abs 07/18/2021 2.2  0.7 - 4.0 K/uL Final   Monocytes Relative 07/18/2021 7  % Final   Monocytes Absolute 07/18/2021 0.8  0.1 - 1.0 K/uL Final   Eosinophils Relative 07/18/2021 2  % Final   Eosinophils Absolute 07/18/2021 0.2  0.0 - 0.5 K/uL Final   Basophils Relative 07/18/2021 1  % Final   Basophils Absolute 07/18/2021 0.1  0.0 - 0.1 K/uL Final   Immature Granulocytes 07/18/2021 2  % Final   Abs Immature Granulocytes 07/18/2021 0.17 (A) 0.00 - 0.07 K/uL Final   Performed at [facility], [address] [place], [place] [address]   Sodium 07/18/2021 133 (A) 135 - 145 mmol/L Final   Potassium 07/18/2021 4.7  3.5 - 5.1 mmol/L  Final   Chloride 07/18/2021 106  98 - 111 mmol/L Final   CO2 07/18/2021 21 (A) 22 - 32 mmol/L Final   Glucose, Bld 07/18/2021 143 (A) 70 - 99 mg/dL Final   Glucose reference range applies only to samples taken after fasting for at least 8 hours.   BUN 07/18/2021 27 (A) 8 - 23 mg/dL Final   Creatinine, Ser 07/18/2021 1.30 (A) 0.61 - 1.24 mg/dL Final   Calcium 29/56/213008/25/2022 9.3  8.9 - 10.3 mg/dL Final   GFR, Estimated 07/18/2021 >60  >60 mL/min Final   Comment: (NOTE) Calculated using the CKD-EPI Creatinine Equation (2021)    Anion gap 07/18/2021 6  5 - 15 Final   Performed at [facility], [address] [place], [place] [address]   ABO/RH(D) 07/18/2021 B POS   Final   Antibody Screen 07/18/2021 NEG   Final   Sample Expiration 07/18/2021 08/01/2021,2359   Final   Extend sample reason 07/18/2021    Final                   Value:NO TRANSFUSIONS OR PREGNANCY IN THE PAST 3 MONTHS Performed at [facility], [address] [place], [place] [address]     ECG: Date: 07/18/2021 Time ECG obtained: 0857 AM Rate: 87 bpm Rhythm: normal sinus Axis (leads I and aVF): Normal Intervals: PR 168  ms. QRS 90 ms. QTc 430 ms. ST segment and T wave changes: Anterior ST elevation (minimal); asymptomatic with no angina/anginal equivalent symptoms.  Comparison: Similar to previous tracing obtained on 02/19/2021 --> early repolarization per cardiology.   IMAGING / PROCEDURES: LEXISCAN performed on 03/04/2021 LVEF 50% Regional wall motion reveals normal myocardial thickening and wall motion No evidence of stress-induced myocardial ischemia or arrhythmia No artifacts noted Left ventricular cavity size normal The overall quality of study is good   ECHOCARDIOGRAM performed on 02/14/2020 Left ventricular ejection fraction, by estimation, is <20%. The left ventricle has severely decreased function. The left ventricle demonstrates global hypokinesis. The left ventricular internal cavity size was moderately dilated. Left ventricular diastolic parameters were normal.  Right ventricular systolic function is severely reduced. The right ventricular size is mildly enlarged. There is normal pulmonary artery systolic pressure.  Left atrial size was mildly dilated.  Right atrial size was mildly dilated.  The mitral valve is normal in structure. Moderate mitral valve regurgitation.  Tricuspid valve regurgitation is moderate.  The aortic valve is normal in structure. Aortic valve regurgitation is trivial.  Impression and Plan:  Shawn Taylor has been referred for pre-anesthesia review and clearance prior to him undergoing the planned anesthetic and procedural courses. Available labs, pertinent testing, and imaging results were personally reviewed by me. This patient has been appropriately cleared by cardiology with an overall LOW risk of significant perioperative cardiovascular complications.  Based on clinical review performed today (07/19/21), barring any significant acute changes in the patient's overall condition, it is anticipated that he will be able to proceed with the planned surgical intervention.  Any acute changes in clinical condition may necessitate his procedure being postponed and/or cancelled. Patient will meet with anesthesia team (MD and/or CRNA) on the day of his procedure for preoperative evaluation/assessment. Questions regarding anesthetic course will be fielded at that time.   Pre-surgical instructions were reviewed with the patient during his PAT appointment and questions were fielded by PAT clinical staff. Patient was advised that if any questions or concerns arise prior to his procedure then he should return a call  to PAT and/or his surgeon's office to discuss.  Quentin Mulling, MSN, APRN, FNP-C, CEN [facility]  Peri-operative Services Nurse Practitioner Phone: [phone] Fax: [phone] 07/19/21 9:00 AM  NOTE: This note has been prepared using Dragon dictation software. Despite my best ability to proofread, there is always the potential that unintentional transcriptional errors may still occur from this process.

## 2021-07-22 ENCOUNTER — Other Ambulatory Visit: Payer: Self-pay

## 2021-07-22 ENCOUNTER — Other Ambulatory Visit
Admission: RE | Admit: 2021-07-22 | Discharge: 2021-07-22 | Disposition: A | Discharge status: Home / Self Care | Payer: Medicaid Other | Source: Ambulatory Visit | Attending: Vascular Surgery | Admitting: Vascular Surgery

## 2021-07-22 DIAGNOSIS — Z01812 Encounter for preprocedural laboratory examination: Secondary | ICD-10-CM | POA: Diagnosis present

## 2021-07-22 DIAGNOSIS — Z20822 Contact with and (suspected) exposure to covid-19: Secondary | ICD-10-CM | POA: Diagnosis not present

## 2021-07-22 LAB — SARS CORONAVIRUS 2 (TAT 6-24 HRS): SARS Coronavirus 2: NEGATIVE

## 2021-07-23 MED ORDER — LACTATED RINGERS IV SOLN: INTRAVENOUS | Status: DC

## 2021-07-23 MED ORDER — ORAL CARE MOUTH RINSE: 15.0000 mL | Freq: Once | OROMUCOSAL | Status: AC

## 2021-07-23 MED ORDER — CEFAZOLIN SODIUM-DEXTROSE 2-4 GM/100ML-% IV SOLN
2.0000 g | INTRAVENOUS | Status: AC
  Administered 2021-07-24: 2 g via INTRAVENOUS

## 2021-07-23 MED ORDER — FAMOTIDINE 20 MG PO TABS: 20.0000 mg | ORAL_TABLET | Freq: Once | ORAL | Status: AC

## 2021-07-23 MED ORDER — CHLORHEXIDINE GLUCONATE CLOTH 2 % EX PADS: 6.0000 | MEDICATED_PAD | Freq: Once | CUTANEOUS | Status: DC

## 2021-07-23 MED ORDER — CHLORHEXIDINE GLUCONATE CLOTH 2 % EX PADS
6.0000 | MEDICATED_PAD | Freq: Once | CUTANEOUS | Status: DC
Start: 2021-07-23 — End: 2021-07-24

## 2021-07-23 MED ORDER — CHLORHEXIDINE GLUCONATE 0.12 % MT SOLN
15.0000 mL | Freq: Once | OROMUCOSAL | Status: AC
Start: 2021-07-23 — End: 2021-07-24

## 2021-07-24 ENCOUNTER — Encounter
Admission: RE | Disposition: A | Discharge status: Home Health | Payer: Self-pay | Source: Home / Self Care | Attending: Vascular Surgery

## 2021-07-24 ENCOUNTER — Inpatient Hospital Stay: Payer: Medicaid Other

## 2021-07-24 ENCOUNTER — Encounter: Payer: Self-pay | Admitting: Vascular Surgery

## 2021-07-24 ENCOUNTER — Inpatient Hospital Stay: Payer: Medicaid Other | Admitting: Urgent Care

## 2021-07-24 ENCOUNTER — Inpatient Hospital Stay
Admission: RE | Admit: 2021-07-24 | Discharge: 2021-07-27 | DRG: 271 | Disposition: A | Discharge status: Home Health | Payer: Medicaid Other | Attending: Vascular Surgery | Admitting: Vascular Surgery

## 2021-07-24 ENCOUNTER — Other Ambulatory Visit: Payer: Self-pay

## 2021-07-24 DIAGNOSIS — I252 Old myocardial infarction: Secondary | ICD-10-CM | POA: Diagnosis not present

## 2021-07-24 DIAGNOSIS — Z7901 Long term (current) use of anticoagulants: Secondary | ICD-10-CM

## 2021-07-24 DIAGNOSIS — Z86718 Personal history of other venous thrombosis and embolism: Secondary | ICD-10-CM

## 2021-07-24 DIAGNOSIS — I6523 Occlusion and stenosis of bilateral carotid arteries: Secondary | ICD-10-CM | POA: Diagnosis present

## 2021-07-24 DIAGNOSIS — I4892 Unspecified atrial flutter: Secondary | ICD-10-CM | POA: Diagnosis present

## 2021-07-24 DIAGNOSIS — E1151 Type 2 diabetes mellitus with diabetic peripheral angiopathy without gangrene: Secondary | ICD-10-CM

## 2021-07-24 DIAGNOSIS — M79604 Pain in right leg: Secondary | ICD-10-CM | POA: Diagnosis present

## 2021-07-24 DIAGNOSIS — I4891 Unspecified atrial fibrillation: Secondary | ICD-10-CM | POA: Diagnosis present

## 2021-07-24 DIAGNOSIS — Z8674 Personal history of sudden cardiac arrest: Secondary | ICD-10-CM | POA: Diagnosis not present

## 2021-07-24 DIAGNOSIS — Z981 Arthrodesis status: Secondary | ICD-10-CM | POA: Diagnosis not present

## 2021-07-24 DIAGNOSIS — I70211 Atherosclerosis of native arteries of extremities with intermittent claudication, right leg: Secondary | ICD-10-CM | POA: Diagnosis present

## 2021-07-24 DIAGNOSIS — F17210 Nicotine dependence, cigarettes, uncomplicated: Secondary | ICD-10-CM | POA: Diagnosis present

## 2021-07-24 DIAGNOSIS — E782 Mixed hyperlipidemia: Secondary | ICD-10-CM | POA: Diagnosis present

## 2021-07-24 DIAGNOSIS — Z79899 Other long term (current) drug therapy: Secondary | ICD-10-CM

## 2021-07-24 DIAGNOSIS — I7025 Atherosclerosis of native arteries of other extremities with ulceration: Secondary | ICD-10-CM | POA: Diagnosis present

## 2021-07-24 DIAGNOSIS — Z7982 Long term (current) use of aspirin: Secondary | ICD-10-CM

## 2021-07-24 DIAGNOSIS — Z8619 Personal history of other infectious and parasitic diseases: Secondary | ICD-10-CM

## 2021-07-24 DIAGNOSIS — I5022 Chronic systolic (congestive) heart failure: Secondary | ICD-10-CM | POA: Diagnosis present

## 2021-07-24 DIAGNOSIS — I70221 Atherosclerosis of native arteries of extremities with rest pain, right leg: Secondary | ICD-10-CM

## 2021-07-24 DIAGNOSIS — I70229 Atherosclerosis of native arteries of extremities with rest pain, unspecified extremity: Secondary | ICD-10-CM | POA: Diagnosis not present

## 2021-07-24 DIAGNOSIS — I110 Hypertensive heart disease with heart failure: Secondary | ICD-10-CM | POA: Diagnosis present

## 2021-07-24 DIAGNOSIS — I2510 Atherosclerotic heart disease of native coronary artery without angina pectoris: Secondary | ICD-10-CM | POA: Diagnosis present

## 2021-07-24 DIAGNOSIS — N179 Acute kidney failure, unspecified: Secondary | ICD-10-CM | POA: Diagnosis present

## 2021-07-24 DIAGNOSIS — Z833 Family history of diabetes mellitus: Secondary | ICD-10-CM | POA: Diagnosis not present

## 2021-07-24 DIAGNOSIS — I10 Essential (primary) hypertension: Secondary | ICD-10-CM

## 2021-07-24 HISTORY — DX: Long term (current) use of anticoagulants: Z79.01

## 2021-07-24 HISTORY — PX: ENDARTERECTOMY FEMORAL: SHX5804

## 2021-07-24 LAB — COMPREHENSIVE METABOLIC PANEL
ALT: 15 U/L (ref 0–44)
AST: 16 U/L (ref 15–41)
Albumin: 3.4 g/dL — ABNORMAL LOW (ref 3.5–5.0)
Alkaline Phosphatase: 105 U/L (ref 38–126)
Anion gap: 5 (ref 5–15)
BUN: 28 mg/dL — ABNORMAL HIGH (ref 8–23)
CO2: 18 mmol/L — ABNORMAL LOW (ref 22–32)
Calcium: 8.3 mg/dL — ABNORMAL LOW (ref 8.9–10.3)
Chloride: 109 mmol/L (ref 98–111)
Creatinine, Ser: 1.28 mg/dL — ABNORMAL HIGH (ref 0.61–1.24)
GFR, Estimated: >60 mL/min (ref >60)
Glucose, Bld: 117 mg/dL — ABNORMAL HIGH (ref 70–99)
Potassium: 6.4 mmol/L (ref 3.5–5.1)
Sodium: 132 mmol/L — ABNORMAL LOW (ref 135–145)
Total Bilirubin: 0.7 mg/dL (ref 0.3–1.2)
Total Protein: 6.8 g/dL (ref 6.5–8.1)

## 2021-07-24 LAB — HEMOGLOBIN A1C
Hgb A1c MFr Bld: 6.8 % — ABNORMAL HIGH (ref 4.8–5.6)
Mean Plasma Glucose: 148.46 mg/dL

## 2021-07-24 LAB — CBC
HCT: 40.4 % (ref 39.0–52.0)
Hemoglobin: 13.5 g/dL (ref 13.0–17.0)
MCH: 29.7 pg (ref 26.0–34.0)
MCHC: 33.4 g/dL (ref 30.0–36.0)
MCV: 89 fL (ref 80.0–100.0)
Platelets: 274 10*3/uL (ref 150–400)
RBC: 4.54 MIL/uL (ref 4.22–5.81)
RDW: 14.5 % (ref 11.5–15.5)
WBC: 11.9 10*3/uL — ABNORMAL HIGH (ref 4.0–10.5)
nRBC: 0 % (ref 0.0–0.2)

## 2021-07-24 LAB — MRSA NEXT GEN BY PCR, NASAL: MRSA by PCR Next Gen: NOT DETECTED

## 2021-07-24 LAB — MAGNESIUM: Magnesium: 1.8 mg/dL (ref 1.7–2.4)

## 2021-07-24 LAB — GLUCOSE, CAPILLARY: Glucose-Capillary: 111 mg/dL — ABNORMAL HIGH (ref 70–99)

## 2021-07-24 SURGERY — ENDARTERECTOMY, FEMORAL
Anesthesia: General | Laterality: Right

## 2021-07-24 MED ORDER — HYDRALAZINE HCL 20 MG/ML IJ SOLN
5.0000 mg | INTRAMUSCULAR | Status: DC | PRN
Start: 2021-07-24 — End: 2021-07-27

## 2021-07-24 MED ORDER — PHENYLEPHRINE HCL (PRESSORS) 10 MG/ML IV SOLN
INTRAVENOUS | Status: DC | PRN
  Administered 2021-07-24 (×2): 100 ug via INTRAVENOUS
  Administered 2021-07-24: 200 ug via INTRAVENOUS
  Administered 2021-07-24 (×3): 100 ug via INTRAVENOUS
  Administered 2021-07-24: 200 ug via INTRAVENOUS

## 2021-07-24 MED ORDER — KETAMINE HCL 50 MG/5ML IJ SOSY
PREFILLED_SYRINGE | INTRAMUSCULAR | Status: AC
  Filled 2021-07-24: qty 5

## 2021-07-24 MED ORDER — ATORVASTATIN CALCIUM 20 MG PO TABS
40.0000 mg | ORAL_TABLET | Freq: Every day | ORAL | Status: DC
  Administered 2021-07-24 – 2021-07-26 (×3): 40 mg via ORAL
  Filled 2021-07-24 (×3): qty 2

## 2021-07-24 MED ORDER — SODIUM CHLORIDE 0.9% FLUSH
3.0000 mL | Freq: Two times a day (BID) | INTRAVENOUS | Status: DC
  Administered 2021-07-25 – 2021-07-27 (×4): 3 mL via INTRAVENOUS

## 2021-07-24 MED ORDER — DEXMEDETOMIDINE (PRECEDEX) IN NS 20 MCG/5ML (4 MCG/ML) IV SYRINGE
PREFILLED_SYRINGE | INTRAVENOUS | Status: DC | PRN
  Administered 2021-07-24 (×3): 8 ug via INTRAVENOUS
  Administered 2021-07-24: 12 ug via INTRAVENOUS

## 2021-07-24 MED ORDER — SODIUM BICARBONATE 8.4 % IV SOLN
50.0000 meq | Freq: Once | INTRAVENOUS | Status: AC
  Administered 2021-07-24: 50 meq via INTRAVENOUS
  Filled 2021-07-24: qty 50

## 2021-07-24 MED ORDER — LIDOCAINE HCL (CARDIAC) PF 100 MG/5ML IV SOSY
PREFILLED_SYRINGE | INTRAVENOUS | Status: DC | PRN
  Administered 2021-07-24: 100 mg via INTRAVENOUS

## 2021-07-24 MED ORDER — GLYCOPYRROLATE 0.2 MG/ML IJ SOLN
INTRAMUSCULAR | Status: DC | PRN
  Administered 2021-07-24: .2 mg via INTRAVENOUS

## 2021-07-24 MED ORDER — "VISTASEAL 4 ML SINGLE DOSE KIT "
PACK | CUTANEOUS | Status: DC | PRN
  Administered 2021-07-24: 4 mL via TOPICAL

## 2021-07-24 MED ORDER — OXYCODONE HCL 5 MG PO TABS
5.0000 mg | ORAL_TABLET | ORAL | Status: DC | PRN
  Administered 2021-07-25: 10 mg via ORAL
  Filled 2021-07-24: qty 2

## 2021-07-24 MED ORDER — NITROGLYCERIN IN D5W 200-5 MCG/ML-% IV SOLN
INTRAVENOUS | Status: AC
  Filled 2021-07-24: qty 250

## 2021-07-24 MED ORDER — FENTANYL CITRATE (PF) 100 MCG/2ML IJ SOLN
INTRAMUSCULAR | Status: DC | PRN
  Administered 2021-07-24: 25 ug via INTRAVENOUS
  Administered 2021-07-24: 50 ug via INTRAVENOUS
  Administered 2021-07-24: 25 ug via INTRAVENOUS
  Administered 2021-07-24 (×2): 50 ug via INTRAVENOUS
  Administered 2021-07-24 (×2): 25 ug via INTRAVENOUS

## 2021-07-24 MED ORDER — BUPIVACAINE LIPOSOME 1.3 % IJ SUSP
INTRAMUSCULAR | Status: AC
  Filled 2021-07-24: qty 20

## 2021-07-24 MED ORDER — PROPOFOL 10 MG/ML IV BOLUS
INTRAVENOUS | Status: AC
  Filled 2021-07-24: qty 20

## 2021-07-24 MED ORDER — CEFAZOLIN SODIUM-DEXTROSE 1-4 GM/50ML-% IV SOLN
1.0000 g | Freq: Three times a day (TID) | INTRAVENOUS | Status: AC
  Administered 2021-07-25 (×3): 1 g via INTRAVENOUS
  Filled 2021-07-24 (×3): qty 50

## 2021-07-24 MED ORDER — ACETAMINOPHEN 10 MG/ML IV SOLN
INTRAVENOUS | Status: DC | PRN
  Administered 2021-07-24: 1000 mg via INTRAVENOUS

## 2021-07-24 MED ORDER — BUPIVACAINE LIPOSOME 1.3 % IJ SUSP
INTRAMUSCULAR | Status: DC | PRN
  Administered 2021-07-24: 50 mL

## 2021-07-24 MED ORDER — HYDRALAZINE HCL 50 MG PO TABS
25.0000 mg | ORAL_TABLET | Freq: Three times a day (TID) | ORAL | Status: DC
  Administered 2021-07-24 – 2021-07-27 (×9): 25 mg via ORAL
  Filled 2021-07-24 (×9): qty 1

## 2021-07-24 MED ORDER — SODIUM CHLORIDE 0.9 % IV SOLN
INTRAVENOUS | Status: DC | PRN
  Administered 2021-07-24: 100 mL via INTRAMUSCULAR

## 2021-07-24 MED ORDER — ACETAMINOPHEN 325 MG PO TABS: 650.0000 mg | ORAL_TABLET | ORAL | Status: DC | PRN

## 2021-07-24 MED ORDER — 0.9 % SODIUM CHLORIDE (POUR BTL) OPTIME
TOPICAL | Status: DC | PRN
  Administered 2021-07-24: 200 mL

## 2021-07-24 MED ORDER — SODIUM CHLORIDE 0.9 % IV SOLN: 250.0000 mL | INTRAVENOUS | Status: DC | PRN

## 2021-07-24 MED ORDER — DEXAMETHASONE SODIUM PHOSPHATE 10 MG/ML IJ SOLN
INTRAMUSCULAR | Status: DC | PRN
  Administered 2021-07-24: 5 mg via INTRAVENOUS

## 2021-07-24 MED ORDER — FAMOTIDINE 20 MG PO TABS
ORAL_TABLET | ORAL | Status: AC
  Administered 2021-07-24: 20 mg via ORAL
  Filled 2021-07-24: qty 1

## 2021-07-24 MED ORDER — LACTATED RINGERS IV SOLN: INTRAVENOUS | Status: DC | PRN

## 2021-07-24 MED ORDER — SODIUM CHLORIDE 0.9 % IV SOLN: INTRAVENOUS | Status: DC

## 2021-07-24 MED ORDER — MIDAZOLAM HCL 2 MG/2ML IJ SOLN
INTRAMUSCULAR | Status: DC | PRN
Start: 2021-07-24 — End: 2021-07-24
  Administered 2021-07-24 (×2): 1 mg via INTRAVENOUS

## 2021-07-24 MED ORDER — LABETALOL HCL 5 MG/ML IV SOLN
10.0000 mg | INTRAVENOUS | Status: DC | PRN
Start: 2021-07-24 — End: 2021-07-27

## 2021-07-24 MED ORDER — FENTANYL CITRATE (PF) 100 MCG/2ML IJ SOLN
INTRAMUSCULAR | Status: AC
  Filled 2021-07-24: qty 2

## 2021-07-24 MED ORDER — INSULIN ASPART 100 UNIT/ML IJ SOLN
0.0000 [IU] | Freq: Three times a day (TID) | INTRAMUSCULAR | Status: DC
  Administered 2021-07-25: 2 [IU] via SUBCUTANEOUS
  Administered 2021-07-25: 3 [IU] via SUBCUTANEOUS
  Administered 2021-07-25 – 2021-07-26 (×4): 2 [IU] via SUBCUTANEOUS
  Administered 2021-07-27: 3 [IU] via SUBCUTANEOUS
  Filled 2021-07-24 (×8): qty 1

## 2021-07-24 MED ORDER — HEMOSTATIC AGENTS (NO CHARGE) OPTIME
TOPICAL | Status: DC | PRN
  Administered 2021-07-24: 1 via TOPICAL

## 2021-07-24 MED ORDER — PROPOFOL 10 MG/ML IV BOLUS
INTRAVENOUS | Status: DC | PRN
  Administered 2021-07-24: 70 mg via INTRAVENOUS
  Administered 2021-07-24: 130 mg via INTRAVENOUS

## 2021-07-24 MED ORDER — CEFAZOLIN SODIUM-DEXTROSE 2-4 GM/100ML-% IV SOLN
INTRAVENOUS | Status: AC
  Filled 2021-07-24: qty 100

## 2021-07-24 MED ORDER — CHLORHEXIDINE GLUCONATE 0.12 % MT SOLN
OROMUCOSAL | Status: AC
  Administered 2021-07-24: 15 mL via OROMUCOSAL
  Filled 2021-07-24: qty 15

## 2021-07-24 MED ORDER — MIDAZOLAM HCL 2 MG/2ML IJ SOLN
INTRAMUSCULAR | Status: AC
  Filled 2021-07-24: qty 2

## 2021-07-24 MED ORDER — ASPIRIN EC 81 MG PO TBEC
81.0000 mg | DELAYED_RELEASE_TABLET | Freq: Every day | ORAL | Status: DC
  Administered 2021-07-25 – 2021-07-27 (×3): 81 mg via ORAL
  Filled 2021-07-24 (×3): qty 1

## 2021-07-24 MED ORDER — PHENYLEPHRINE HCL (PRESSORS) 10 MG/ML IV SOLN
INTRAVENOUS | Status: AC
  Filled 2021-07-24: qty 1

## 2021-07-24 MED ORDER — MORPHINE SULFATE (PF) 2 MG/ML IV SOLN
2.0000 mg | INTRAVENOUS | Status: DC | PRN
Start: 2021-07-24 — End: 2021-07-27
  Administered 2021-07-25 (×2): 2 mg via INTRAVENOUS
  Filled 2021-07-24 (×2): qty 1

## 2021-07-24 MED ORDER — ACETAMINOPHEN 10 MG/ML IV SOLN
INTRAVENOUS | Status: AC
  Filled 2021-07-24: qty 100

## 2021-07-24 MED ORDER — ONDANSETRON HCL 4 MG/2ML IJ SOLN
INTRAMUSCULAR | Status: DC | PRN
Start: 2021-07-24 — End: 2021-07-24
  Administered 2021-07-24: 4 mg via INTRAVENOUS

## 2021-07-24 MED ORDER — DEXTROSE 50 % IV SOLN
50.0000 mL | Freq: Once | INTRAVENOUS | Status: AC
  Administered 2021-07-24: 50 mL via INTRAVENOUS
  Filled 2021-07-24: qty 50

## 2021-07-24 MED ORDER — HEPARIN SODIUM (PORCINE) 5000 UNIT/ML IJ SOLN
INTRAMUSCULAR | Status: AC
  Filled 2021-07-24: qty 1

## 2021-07-24 MED ORDER — SODIUM CHLORIDE 0.9% FLUSH: 3.0000 mL | INTRAVENOUS | Status: DC | PRN

## 2021-07-24 MED ORDER — SODIUM CHLORIDE 0.9 % IV SOLN
INTRAVENOUS | Status: DC | PRN
  Administered 2021-07-24: 40 ug/min via INTRAVENOUS

## 2021-07-24 MED ORDER — KETAMINE HCL 10 MG/ML IJ SOLN
INTRAMUSCULAR | Status: DC | PRN
  Administered 2021-07-24: 25 mg via INTRAVENOUS

## 2021-07-24 MED ORDER — CALCIUM GLUCONATE-NACL 1-0.675 GM/50ML-% IV SOLN
1.0000 g | Freq: Once | INTRAVENOUS | Status: AC
  Administered 2021-07-24: 1000 mg via INTRAVENOUS
  Filled 2021-07-24: qty 50

## 2021-07-24 MED ORDER — ONDANSETRON HCL 4 MG/2ML IJ SOLN: 4.0000 mg | Freq: Four times a day (QID) | INTRAMUSCULAR | Status: DC | PRN

## 2021-07-24 MED ORDER — METOPROLOL SUCCINATE ER 25 MG PO TB24
25.0000 mg | ORAL_TABLET | ORAL | Status: DC
  Administered 2021-07-25 – 2021-07-27 (×3): 25 mg via ORAL
  Filled 2021-07-24 (×4): qty 1

## 2021-07-24 MED ORDER — BUPIVACAINE HCL (PF) 0.5 % IJ SOLN
INTRAMUSCULAR | Status: AC
  Filled 2021-07-24: qty 30

## 2021-07-24 MED ORDER — ISOSORBIDE MONONITRATE ER 30 MG PO TB24
30.0000 mg | ORAL_TABLET | ORAL | Status: DC
  Administered 2021-07-25 – 2021-07-27 (×3): 30 mg via ORAL
  Filled 2021-07-24 (×4): qty 1

## 2021-07-24 MED ORDER — INSULIN ASPART 100 UNIT/ML IV SOLN
10.0000 [IU] | Freq: Once | INTRAVENOUS | Status: AC
  Administered 2021-07-24: 10 [IU] via INTRAVENOUS
  Filled 2021-07-24: qty 0.1

## 2021-07-24 MED ORDER — HEPARIN SODIUM (PORCINE) 1000 UNIT/ML IJ SOLN
INTRAMUSCULAR | Status: DC | PRN
  Administered 2021-07-24: 2000 [IU] via INTRAVENOUS
  Administered 2021-07-24: 6000 [IU] via INTRAVENOUS

## 2021-07-24 SURGICAL SUPPLY — 84 items
BAG DECANTER FOR FLEXI CONT (MISCELLANEOUS) ×3 IMPLANT
BALLN DORADO 7X60X80 (BALLOONS) ×3
BALLN LUTONIX 018 4X100X130 (BALLOONS) ×3
BALLN LUTONIX 018 4X150X130 (BALLOONS) ×3
BALLN LUTONIX 018 4X220X130 (BALLOONS) ×3
BALLN LUTONIX 018 6X150X130 (BALLOONS) ×3
BALLN LUTONIX 018 6X300X130 (BALLOONS) ×3
BALLOON DORADO 7X60X80 (BALLOONS) ×2 IMPLANT
BALLOON LUTONIX 018 4X100X130 (BALLOONS) ×2 IMPLANT
BALLOON LUTONIX 018 4X150X130 (BALLOONS) ×2 IMPLANT
BALLOON LUTONIX 018 4X220X130 (BALLOONS) ×2 IMPLANT
BALLOON LUTONIX 018 6X150X130 (BALLOONS) ×2 IMPLANT
BALLOON LUTONIX 018 6X300X130 (BALLOONS) ×2 IMPLANT
BLADE SURG 15 STRL LF DISP TIS (BLADE) ×2 IMPLANT
BLADE SURG 15 STRL SS (BLADE) ×1
BLADE SURG SZ11 CARB STEEL (BLADE) ×3 IMPLANT
BOOT SUTURE AID YELLOW STND (SUTURE) ×3 IMPLANT
BRUSH SCRUB EZ  4% CHG (MISCELLANEOUS) ×1
BRUSH SCRUB EZ 4% CHG (MISCELLANEOUS) ×2 IMPLANT
CATH BEACON 5 .035 65 KMP TIP (CATHETERS) ×3 IMPLANT
CHLORAPREP W/TINT 26 (MISCELLANEOUS) ×3 IMPLANT
DECANTER SPIKE VIAL GLASS SM (MISCELLANEOUS) ×3 IMPLANT
DERMABOND ADVANCED (GAUZE/BANDAGES/DRESSINGS) ×2
DERMABOND ADVANCED .7 DNX12 (GAUZE/BANDAGES/DRESSINGS) ×4 IMPLANT
DRAPE INCISE IOBAN 66X45 STRL (DRAPES) ×3 IMPLANT
DRESSING SURGICEL FIBRLLR 1X2 (HEMOSTASIS) ×2 IMPLANT
DRSG OPSITE POSTOP 4X10 (GAUZE/BANDAGES/DRESSINGS) ×6 IMPLANT
DRSG SURGICEL FIBRILLAR 1X2 (HEMOSTASIS) ×3
ELECT CAUTERY BLADE 6.4 (BLADE) ×3 IMPLANT
ELECT REM PT RETURN 9FT ADLT (ELECTROSURGICAL) ×3
ELECTRODE REM PT RTRN 9FT ADLT (ELECTROSURGICAL) ×2 IMPLANT
GAUZE 4X4 16PLY ~~LOC~~+RFID DBL (SPONGE) ×3 IMPLANT
GLIDEWIRE ADV .035X180CM (WIRE) ×3 IMPLANT
GLOVE SURG ENC MOIS LTX SZ7 (GLOVE) ×3 IMPLANT
GLOVE SURG SYN 7.0 (GLOVE) ×6 IMPLANT
GLOVE SURG SYN 8.0 (GLOVE) ×3 IMPLANT
GLOVE SURG UNDER LTX SZ7.5 (GLOVE) ×3 IMPLANT
GOWN STRL REUS W/ TWL LRG LVL3 (GOWN DISPOSABLE) ×4 IMPLANT
GOWN STRL REUS W/ TWL XL LVL3 (GOWN DISPOSABLE) ×6 IMPLANT
GOWN STRL REUS W/TWL LRG LVL3 (GOWN DISPOSABLE) ×2
GOWN STRL REUS W/TWL XL LVL3 (GOWN DISPOSABLE) ×3
IV NS 500ML (IV SOLUTION) ×1
IV NS 500ML BAXH (IV SOLUTION) ×2 IMPLANT
KIT ENCORE 26 ADVANTAGE (KITS) ×3 IMPLANT
KIT MICROPUNCTURE NIT STIFF (SHEATH) ×3 IMPLANT
KIT TURNOVER KIT A (KITS) ×3 IMPLANT
LABEL OR SOLS (LABEL) ×3 IMPLANT
LOOP RED MAXI  1X406MM (MISCELLANEOUS) ×2
LOOP VESSEL MAXI 1X406 RED (MISCELLANEOUS) ×4 IMPLANT
LOOP VESSEL MINI 0.8X406 BLUE (MISCELLANEOUS) ×6 IMPLANT
LOOPS BLUE MINI 0.8X406MM (MISCELLANEOUS) ×3
MANIFOLD NEPTUNE II (INSTRUMENTS) ×3 IMPLANT
NEEDLE HYPO 18GX1.5 BLUNT FILL (NEEDLE) ×3 IMPLANT
NS IRRIG 500ML POUR BTL (IV SOLUTION) ×3 IMPLANT
PACK ANGIOGRAPHY (CUSTOM PROCEDURE TRAY) ×3 IMPLANT
PACK BASIN MAJOR ARMC (MISCELLANEOUS) ×3 IMPLANT
PACK UNIVERSAL (MISCELLANEOUS) ×3 IMPLANT
PATCH CAROTID ECM VASC 1X10 (Prosthesis & Implant Heart) ×6 IMPLANT
SET WALTER ACTIVATION W/DRAPE (SET/KITS/TRAYS/PACK) ×3 IMPLANT
SPONGE T-LAP 18X18 ~~LOC~~+RFID (SPONGE) ×6 IMPLANT
STENT VIABAHN 6X150X120 (Permanent Stent) ×3 IMPLANT
STENT VIABAHN 6X250X120 (Permanent Stent) ×3 IMPLANT
STENT VIABAHN 6X50X120 (Permanent Stent) ×3 IMPLANT
SUT MNCRL+ 5-0 UNDYED PC-3 (SUTURE) ×2 IMPLANT
SUT MONOCRYL 5-0 (SUTURE) ×1
SUT PROLENE 5 0 RB 1 DA (SUTURE) ×6 IMPLANT
SUT PROLENE 6 0 BV (SUTURE) ×42 IMPLANT
SUT PROLENE 7 0 BV 1 (SUTURE) ×12 IMPLANT
SUT SILK 2 0 (SUTURE) ×1
SUT SILK 2-0 18XBRD TIE 12 (SUTURE) ×2 IMPLANT
SUT SILK 3 0 (SUTURE) ×1
SUT SILK 3-0 18XBRD TIE 12 (SUTURE) ×2 IMPLANT
SUT SILK 4 0 (SUTURE) ×1
SUT SILK 4-0 18XBRD TIE 12 (SUTURE) ×2 IMPLANT
SUT VIC AB 2-0 CT1 27 (SUTURE) ×2
SUT VIC AB 2-0 CT1 TAPERPNT 27 (SUTURE) ×4 IMPLANT
SUT VIC AB 3-0 SH 27 (SUTURE) ×1
SUT VIC AB 3-0 SH 27X BRD (SUTURE) ×2 IMPLANT
SUT VICRYL+ 3-0 36IN CT-1 (SUTURE) ×6 IMPLANT
SYR 20ML LL LF (SYRINGE) ×3 IMPLANT
SYR 5ML LL (SYRINGE) ×3 IMPLANT
TRAY FOLEY MTR SLVR 16FR STAT (SET/KITS/TRAYS/PACK) ×3 IMPLANT
WATER STERILE IRR 500ML POUR (IV SOLUTION) ×3 IMPLANT
WIRE G V18X300CM (WIRE) ×3 IMPLANT

## 2021-07-24 NOTE — Progress Notes (Signed)
Dr. Pernell Dupre notified of inability to urinate for urine drug screen. Will continue trying to obtain. Last two UDS negative.

## 2021-07-24 NOTE — Progress Notes (Signed)
eLink Physician-Brief Progress Note Patient Name: Shawn Taylor DOB: 28-May-1956 MRN: [id number]   Date of Service  07/24/2021  HPI/Events of Note  Patient admitted to the ICU post-op Femoral endarterectomy and stent placement.  eICU Interventions  New Patient Evaluation.        Migdalia Dk 07/24/2021, 7:41 PM

## 2021-07-24 NOTE — Op Note (Signed)
OPERATIVE NOTE   PROCEDURE: 1.   Right common femoral and superficial femoral artery endarterectomies and patch angioplasty 2.   Right lower extremity angiogram 3.   Angioplasty and stent placement to the right SFA and popliteal arteries using 3 Viabahn stents 6 mm in diameter 4.   Separate right profunda femoris artery endarterectomy and patch angioplasty using second patch second arteriotomy.   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  significant plaque in right common femoral, profunda femoris, and superficial femoral arteries 2.  SFA occlusion.  Two-vessel runoff distally.  SPECIMEN(S):  Right common femoral, profunda femoris, and superficial femoral artery plaque.  INDICATIONS:    Patient presents with disabling claudication symptoms and rest pain of the right.  Right femoral endarterectomy as well as SFA intervention is planned to try to improve perfusion.  The risks and benefits as well as alternative therapies including intervention were reviewed in detail all questions were answered the patient agrees to proceed with surgery.  DESCRIPTION: After obtaining full informed written consent, the patient was brought back to the operating room and placed supine upon the operating table.  The patient received IV antibiotics prior to induction.  After obtaining adequate anesthesia, the patient was prepped and draped in the standard fashion appropriate time out is called.    Vertical incision was created overlying the right femoral arteries. The common femoral artery proximally, and superficial femoral artery, and primary profunda femoris artery branches were encircled with vessel loops and prepared for control. The right femoral arteries were found to have significant  plaque from the common femoral artery into the profunda and superficial femoral arteries.   6000 units of heparin was given and allowed circulate for 5 minutes.  Additional heparin was given  Attention is then turned to the right femoral artery.  An arteriotomy is made with 11 blade and extended with Potts scissors in the common femoral artery and carried down onto the first 3-4 cm of the right superficial femoral artery. An endarterectomy was then performed. The [facility] was used to create a plane. The proximal endpoint was cut flush with tenotomy scissors. This was in the proximal common femoral artery. An eversion endarterectomy was then performed for the first 2-3 cm of the profunda femoris artery although there was still disease distal to this.  We elected to evaluate this with angiography to determine if more distal profunda femoris artery endarterectomy would be required. The distal endpoint of the superficial femoral artery endarterectomy was created with gentle traction and the distal endpoint was well into the superficial femoral artery up to the planned proximal endpoint of the planned stents.  The Cormatrix patcth is then selected and prepared for a patch angioplasty.  It is cut and beveled and started at the proximal endpoint with a 6-0 Prolene suture.  Approximately one half of the suture line is run medially and laterally and the distal end point was cut and bevelled to match the arteriotomy.  A second 6-0 Prolene was started at the distal end point well into the superficial femoral artery and run to the mid portion to complete the arteriotomy.  The vessel was flushed prior to release of control and completion of the anastomosis.  At this point, flow was established first to the profunda femoris artery and then to the superficial femoral  artery.  We then proceeded with the endovascular portion the procedure.  Dr. Gilda Crease performed this primarily and I assisted him.  The patch was  accessed in the midportion with a micropuncture needle and a micropuncture wire and sheath were then placed.  With control on the SFA and the profunda femoris artery, this was refluxed proximally to make sure our proximal endpoint was clean and it was.  Control was then let loose on the profunda femoris artery and there was found to be a shelf of plaque in the profunda femoris artery beyond the second branch.  This would require a separate profunda femoris artery endarterectomy.  Then, Dr. Gilda Crease was able to cross the lesion using an advantage wire and Kumpe catheter and confirm intraluminal flow in the below-knee popliteal artery.  There was a long segment occlusion and angioplasty was performed with 4 mm diameter angioplasty balloon but there were many multiple areas residual disease throughout.  We then exchanged for a V 18 wire.  A 6 mm diameter by 15 cm length Viabahn stent was deployed first down to just below the knee.  A 6 mm diameter by 25 cm length Viabahn stent was then deployed up to the proximal SFA and an additional 6 mm diameter by 5 cm length Viabahn stent was then brought up to the edge of the patch for the endarterectomy.  These were then postdilated with 4 mm balloon distally and 6 mm balloon throughout the stents and 1 area in the midportion required a high-pressure angioplasty balloon 7 mm to remove the waist.  Following this, there was less than 10% residual stenosis within the SFA and popliteal arteries.  There remained two-vessel runoff although the posterior tibial artery was dominant runoff distally.  The sheath was then removed and 6-0 Prolene sutures were used to repair the area in the patch where this sheath had been placed. We then turned our attention to the profunda femoris artery.  This was clamped proximally with a profunda clamp and then the profunda femoris branches were all encircled with Vesseloops and controlled again.  The lesion was at the level of the second profunda  femoris artery branch well and the distal profunda femoris artery.  Arteriotomy is created with 11 blade and extended with Potts scissors.  The shelf of plaque was then removed and sent as a specimen in the typical fashion with endarterectomy.  A clean distal endpoint was seen and was tacked down with two 7-0 Prolene sutures.  A CorMatrix patch was then cut and beveled to fit the arteriotomy.  6-0 Prolene was started at the proximal endpoint of the arteriotomy in the profunda femoris artery and run half the length of the suture line.  The patch was then cut and beveled to an appropriate length and a second 6-0 Prolene was started at the distal end point and run to the midportion.  After flushing the vessel, we completed the arteriotomy with excellent flow through the profunda femoris artery well beyond the patch with a palpable pulse.  Fibrillar and Vistacel topical hemostatic agents were placed in the femoral incision and hemostasis was complete. The femoral incision was then closed in a layered fashion with 2 layers of 2-0 Vicryl, 2 layers of 3-0 Vicryl, and 4-0 Monocryl for the skin closure. Dermabond and sterile dressing were then placed over the incision.  The patient was then awakened from anesthesia and taken to the recovery room in stable condition having tolerated the procedure well.  COMPLICATIONS: None  CONDITION:  Stable     Festus Barren 07/24/2021 5:01 PM   This note was created with Dragon Medical transcription system. Any errors in dictation are purely unintentional.

## 2021-07-24 NOTE — Plan of Care (Signed)
Patient received in bed, oriented times 4. Denies pain, dressing to right groin D&I, site soft. Neurovascular assessment as charted. Elizabeth NP aware of critical , new orders received and carried out.

## 2021-07-24 NOTE — Transfer of Care (Signed)
Immediate Anesthesia Transfer of Care Note  Patient: Shawn Taylor  Procedure(s) Performed: ENDARTERECTOMY FEMORAL WITH STENT PLACEMENT (Right) APPLICATION OF CELL SAVER  Patient Location: PACU  Anesthesia Type:General  Level of Consciousness: awake, alert  and oriented  Airway & Oxygen Therapy: Patient Spontanous Breathing and Patient connected to face mask oxygen  Post-op Assessment: Report given to RN and Post -op Vital signs reviewed and stable  Post vital signs: Reviewed and stable  Last Vitals:  Vitals Value Taken Time  BP 122/67 07/24/21 1734  Temp    Pulse 80 07/24/21 1735  Resp 18 07/24/21 1735  SpO2 98 % 07/24/21 1735  Vitals shown include unvalidated device data.  Last Pain:  Vitals:   07/24/21 1053  TempSrc: Tympanic  PainSc: 0-No pain         Complications: No notable events documented.

## 2021-07-24 NOTE — Anesthesia Procedure Notes (Signed)
Procedure Name: LMA Insertion Date/Time: 07/24/2021 1:27 PM Performed by: Jaye Beagle, CRNA Pre-anesthesia Checklist: Patient identified, Emergency Drugs available, Suction available and Patient being monitored Patient Re-evaluated:Patient Re-evaluated prior to induction Oxygen Delivery Method: Circle system utilized Preoxygenation: Pre-oxygenation with 100% oxygen Induction Type: IV induction Ventilation: Mask ventilation without difficulty LMA: LMA inserted LMA Size: 4.5 Tube type: Oral Number of attempts: 1 Placement Confirmation: positive ETCO2 and breath sounds checked- equal and bilateral Tube secured with: Tape Dental Injury: Teeth and Oropharynx as per pre-operative assessment

## 2021-07-24 NOTE — Op Note (Signed)
OPERATIVE NOTE   PROCEDURE: Right common femoral, superficial femoral and profunda femoris endarterectomy with Cormatrix patch angioplasty. Right distal profunda femoris endarterectomy with a second CorMatrix patch angioplasty. Angioplasty and stent placement right superficial femoral and popliteal arteries with Viabahn stents to a maximum was 6 mm in diameter. Right lower extremity angiography with distal runoff  PRE-OPERATIVE DIAGNOSIS: Atherosclerotic occlusive disease right lower extremity with lifestyle limiting claudication and severe rest pain symptoms; hypertension; diabetes mellitus  POST-OPERATIVE DIAGNOSIS: Same  CO-SURGEON: Katha Cabal, MD and Algernon Huxley, M.D.  ASSISTANT(S): None  ANESTHESIA: general  ESTIMATED BLOOD LOSS: 300 cc  FINDING(S): Profound calcific plaque noted of the right common femoral extending past the initial bifurcation of the profunda femoris arteries as well as down the extensive length of the SFA    SPECIMEN(S):  Calcific plaque from the common femoral, superficial femoral and the profunda femoris artery; with secondary profunda femoris plaque is a separate specimen from the separate endarterectomy with CorMatrix patch  INDICATIONS:   Shawn Taylor 65 y.o. y.o.adult who presents with complaints of lifestyle limiting claudication and pain continuously in the right lower extremity. The patient has documented severe atherosclerotic occlusive disease and has undergone minimally invasive treatments in the past. However, at this point his primary area of stricture stenosis resides in the common femoral and origins of the superficial femoral and profunda femoris extending into these arteries and therefore this is not amenable to intervention. The patient is therefore undergoing open endarterectomy. The risks and benefits of surgery have been reviewed with the patient, all questions have answered; alternative  therapies have been reviewed as well and the patient has agreed to proceed with surgical open repair.  DESCRIPTION: After obtaining full informed written consent, the patient was brought back to the operating room and placed supine upon the operating table.  The patient received IV antibiotics prior to induction.  After obtaining adequate anesthesia, the patient was prepped and draped in the standard fashion for right femoral exposure.  Co-surgeons are required because this is a complicated procedure with work being performed simultaneously from both the patient's right left sides.  This also expedites the procedure making a shorter operative time reducing complications and improving patient safety.  Attention was turned to the right groin with Dr. Lucky Cowboy working on the patient's left and myself working on the right of the patient.  Vertical  Incision was made over the right common femoral artery and dissection carried down to the common femoral artery with electrocautery.  I dissected out the common femoral artery from the distal external iliac artery (identified by the superficial circumflex vessels) down to the femoral bifurcation.  On initial inspection, the common femoral artery was: densely calcified and there was no palpable pulse noted.    Subsequently the dissection was continued to include all circumflex branches and the profunda femoral artery and superficial femoral artery. The superficial femoral artery was dissected circumferentially for a distance of approximately 3-4 cm and the profunda femoris was dissected circumferentially out to the fourth order branches individual vessel loops were placed around each branch.  Control of all branches was obtained with vessel loops.  A softer area in the distal external iliac artery amendable to clamping was identified.    The patient was given 5000 units of Heparin intravenously, which was a therapeutic bolus.   After waiting 3 minutes, the distal external  iliac artery was clamped and all of the vessel loops were placed under tension.  Arteriotomy was  made in the common femoral artery with a 11-blade and extended it with a Potts scissor proximally and distally extending the distal end down the SFA for approximately 3 cm.   Endarterectomy was then performed under direct visualization using a freer elevator and a right angle from the mid common femoral extending up both proximally and distally. Proximally the endarterectomy was brought up to the level of the clamp where a clean edge was obtained. Distally the endarterectomy was carried down to a soft spot in the SFA where a feathered edge would was obtained.    The profunda femoris was treated with an eversion technique extending endarterectomy approximately 2 cm distally again obtaining a featheredge on both sides right and left.   At this point, a corematrix patch was fashioned for the geometry of the arteriotomy.  The patch was sewn to the artery with 2 running stitches of 6-0 Prolene, running from each end.  Prior to completing the patch angioplasty, the profunda femoral artery was flushed as was the superficial femoral artery. The system was then forward flushed. The endarterectomy site was then irrigated copiously with heparinized saline. The patch angioplasty was completed in the usual fashion.  Flow was then reestablished first to the profunda femoris and then the superficial femoral artery. Any gaps or bleeding sites in the suture line were easily controlled with a 6-0 Prolene suture.   Attention was then turned to reopening the mid and distal superficial femoral artery and the popliteal.  A microneedle was then inserted into the middle of the proximal CorMatrix patch.  Wire was advanced without difficulty.  Micro sheath was then inserted.  By occluding the SFA as well as the profunda branches with Silastic Vesseloops reflux of contrast was performed demonstrating the proximal intimal edge in the  external iliac was well tacked there was no evidence of occlusion or hemodynamically significant stenosis.  Silastic Vesseloops were then released.  A J-wire was then advanced through the micro sheath followed by a 6 [place] Pinnacle sheath.  Using a advantage wire and a Kumpe catheter with Dr. Bunnie Domino assistance I negotiated the catheter and wire down into the below-knee popliteal.  Hand-injection of contrast at this level demonstrated the distal popliteal is free of hemodynamically significant stenosis there is two-vessel runoff via the posterior tibial and peroneal.  And that the catheter had reentered the true lumen.  A V 18 wire was then introduced and advanced on the peroneal.  Initially I angioplastied the popliteal and SFA with a 4 mm balloon to predilate the lesion.  Next a 6 mm x 150 mm Viabahn was deployed distally then a 6 mm x 25 cm Viabahn stent was deployed and lastly a 6 mm x 5 cm length Viabahn stent was deployed up to the level of the patch in the groin.  The stents were then postdilated distally across the popliteal a 4 mm Lutonix drug-eluting balloon was used and then 6 mm balloons were used to fully expand the stents.  On follow-up angiography in the midportion just proximal to Hunter's canal the stent maintained a significant waist and a 7 mm x 60 mm Dorado balloon was advanced across this area inflated to 18 atm for 1 minute.  At this time 300 mcg of nitroglycerin was also infused intra-arterially.  Follow-up imaging starting at the origin of the SFA down to the ankle was now performed and demonstrated that the SFA and popliteal were widely patent stents were well approximated and there was less than 10%  residual stenosis throughout the SFA and popliteal.  The distal two-vessel runoff was preserved.  Having successfully recanalized the femoral-popliteal arteries review of the groin shot demonstrated that there was a greater than 80% stenosis within the main trunk of the profunda femoris  approximately 4 cm from the origin this was a tandem lesion and could not be treated with the original endarterectomy.  It necessitated a completely separate and distinct endarterectomy.  A profunda clamp was placed across the origin of the profunda femoris and the dissection carried to a level below the lesion where Silastic Vesseloops were used to control numerous branches.  Arteriotomy was then made extended with Potts scissors the lesion was easy to identify endart rectum he was performed with a freer elevator 7-0 Prolene tacking sutures were used for the distal intimal edge.  A new CorMatrix patch was opened onto the back table and soaked in saline.  It was then trimmed to the appropriate size and length and applied to the arteriotomy using 6-0 Prolene in a running fashion.  Flushing maneuvers were performed and flow was reestablished to the profunda femoris.  All suture lines were inspected for hemostasis.  The right groin was then irrigated copiously with sterile saline and subsequently Vistaseal and fibrillar were placed in the wound. The incision was repaired with a double layer of 2-0 Vicryl, a double layer of 3-0 Vicryl, and a layer of 4-0 Monocryl in a subcuticular fashion.  The skin was cleaned, dried, and reinforced with Dermabond.  COMPLICATIONS: None  CONDITION: Shawn Taylor, M.D. [facility] Office: [phone]  07/24/2021, 5:30 PM

## 2021-07-24 NOTE — Consult Note (Signed)
NAME:  Shawn Taylor, MRN:  [id number], DOB:  Jan 29, 1956, LOS: 0 ADMISSION DATE:  07/24/2021, CONSULTATION DATE: 07/24/2021 REFERRING MD: Levora Dredge, MD, CHIEF COMPLAINT: PAD   HPI  65 year old male with significant history of atrial flutter/A. fib with RVR, CAD, cardiac arrest with MI, bilateral DVTs, HTN, HLD, HCV, EtOH and cocaine abuse, tobacco abuse, chronic thrombocytopenia, CHF, and PAD who presented to the hospital with rest pain and claudication secondary to severe peripheral arterial disease (PAD) of bilateral lower extremities.  Patient previously had an angiogram performed which showed evidence of severe atherosclerotic changes of both lower extremities.  Per vascular surgery, patient not at risk for limb threatening ischemia and even high risk for right lower limb loss.  Given that the situation was not ideal for intervention, and open surgical repair was recommended therefore patient presented for an elective arterial reconstruction of the lower extremity with the hope for limb salvage.  Hospital Course: Patient underwent right femoral endarterectomy with stent placement on 8/31.  He was transferred to the ICU for close observation.  PCCM consulted for close monitoring of any complication postop.  Labs/Diagnostics WBC/Hgb/Hct/Plts:  11.9/13.5/40.4/274  Potassium 6.4, DM 132, glucose 117, BUN/Cr 28/1.28  Past Medical History  Atrial flutter A. fib with RVR CAD Cardiac arrest (asystole) with MI Bilateral lower extremity DVTs HTN EtOH and cocaine abuse Tobacco abuse HDL HCV (hepatitis C virus) treated with Harvoni PAD Thrombocytopenia Chronic systolic CHF Carotid artery occlusion Cervical myelopathy  Significant Hospital Events   8/31:  Consults:  PCCM  Procedures:  8/31:Femoral endarterectomy and stent placement.  Significant Diagnostic Tests:  None  Micro Data:  8/29: SARS-CoV-2 PCR> negative 8/29: Influenza PCR> negative 8/31: MRSA  PCR>  Antimicrobials:  Cefazolin 8/31>   OBJECTIVE  Blood pressure 112/68, pulse 62, temperature 98.2 F (36.8 C), temperature source Oral, resp. rate 14, height 5\' 8"  (1.727 m), weight 91.6 kg, SpO2 97 %.        Intake/Output Summary (Last 24 hours) at 07/24/2021 2117 Last data filed at 07/24/2021 1830 Gross per 24 hour  Intake 1400 ml  Output 675 ml  Net 725 ml   Filed Weights   07/24/21 1053  Weight: 91.6 kg     Physical Examination  GENERAL: 65 year-old critically ill patient lying in the bed with no acute distress.  EYES: Pupils equal, round, reactive to light and accommodation. No scleral icterus. Extraocular muscles intact.  HEENT: Head atraumatic, normocephalic. Oropharynx and nasopharynx clear.  NECK:  Supple, no jugular venous distention. No thyroid enlargement, no tenderness.  LUNGS: Normal breath sounds bilaterally, no wheezing, rales,rhonchi or crepitation. No use of accessory muscles of respiration.  CARDIOVASCULAR: S1, S2 normal. No murmurs, rubs, or gallops.  ABDOMEN: Soft, nontender, nondistended. Bowel sounds present. No organomegaly or mass.  EXTREMITIES: No pedal edema, cyanosis, or clubbing.  NEUROLOGIC: Cranial nerves II through XII are intact.  Muscle strength 5/5 in all extremities. Sensation intact. Gait not checked.  PSYCHIATRIC: The patient is alert and oriented x 3.  SKIN: No obvious rash, lesion, or ulcer.   Labs/imaging that I havepersonally reviewed  (right click and "Reselect all SmartList Selections" daily)     Labs   CBC: Recent Labs  Lab 07/18/21 0901 07/24/21 1853  WBC 11.0* 11.9*  NEUTROABS 7.7  --   HGB 14.2 13.5  HCT 43.3 40.4  MCV 88.0 89.0  PLT 346 274    Basic Metabolic Panel: Recent Labs  Lab 07/18/21 0901 07/24/21 1853  NA 133* 132*  K 4.7 6.4*  CL 106 109  CO2 21* 18*  GLUCOSE 143* 117*  BUN 27* 28*  CREATININE 1.30* 1.28*  CALCIUM 9.3 8.3*  MG  --  1.8   GFR: Estimated Creatinine Clearance (by C-G  formula based on SCr of 1.28 mg/dL (H)) Male: 16.9 mL/min (A) Male: 64.1 mL/min (A) Recent Labs  Lab 07/18/21 0901 07/24/21 1853  WBC 11.0* 11.9*    Liver Function Tests: Recent Labs  Lab 07/24/21 1853  AST 16  ALT 15  ALKPHOS 105  BILITOT 0.7  PROT 6.8  ALBUMIN 3.4*   No results for input(s): LIPASE, AMYLASE in the last 168 hours. No results for input(s): AMMONIA in the last 168 hours.  ABG    Component Value Date/Time   PHART 7.48 (H) 02/27/2020 0512   PCO2ART 35 02/27/2020 0512   PO2ART 62 (L) 02/27/2020 0512   HCO3 26.1 02/27/2020 0512   ACIDBASEDEF 1.5 02/15/2020 0855   O2SAT 93.1 02/27/2020 0512     Coagulation Profile: No results for input(s): INR, PROTIME in the last 168 hours.  Cardiac Enzymes: No results for input(s): CKTOTAL, CKMB, CKMBINDEX, TROPONINI in the last 168 hours.  HbA1C: No results found for: HGBA1C  CBG: Recent Labs  Lab 07/24/21 1926  GLUCAP 111*    Review of Systems:   Review of Systems  Constitutional: Negative.   HENT: Negative.    Eyes: Negative.   Respiratory: Negative.    Cardiovascular:  Positive for claudication and leg swelling.  Gastrointestinal: Negative.   Genitourinary: Negative.   Musculoskeletal:  Positive for back pain.  Skin: Negative.   Neurological:  Positive for sensory change and weakness.  Psychiatric/Behavioral: Negative.      Past Medical History  He,  has a past medical history of Atrial flutter (HCC), CAD (coronary artery disease), Cardiac arrest (HCC) (02/12/2020), Carotid artery occlusion, Cervical myelopathy (HCC), CHF (congestive heart failure) (HCC), Chronic anticoagulation, DOE (dyspnea on exertion), DVT (deep venous thrombosis) (HCC), HCV (hepatitis C virus) (2019), HLD (hyperlipidemia), Hypertension, Migraines, Myocardial infarction (HCC) (02/12/2020), PAD (peripheral artery disease) (HCC), Polysubstance abuse (HCC), and Thrombocytopenia (HCC).   Surgical History    Past Surgical  History:  Procedure Laterality Date   ANTERIOR CERVICAL CORPECTOMY N/A 04/17/2021   Procedure: ANTERIOR CERVICAL CORPECTOMY C4;  Surgeon: Venetia Night, MD;  Location: [facility] ORS;  Service: Neurosurgery;  Laterality: N/A;  2nd case   ANTERIOR CERVICAL DECOMP/DISCECTOMY FUSION N/A 04/17/2021   Procedure: C5-6 ANTERIOR CERVICAL DECOMPRESSION/DISCECTOMY FUSION;  Surgeon: Venetia Night, MD;  Location: [facility] ORS;  Service: Neurosurgery;  Laterality: N/A;   LOWER EXTREMITY ANGIOGRAPHY Right 01/29/2021   Procedure: LOWER EXTREMITY ANGIOGRAPHY;  Surgeon: Renford Dills, MD;  Location: [facility] INVASIVE CV LAB;  Service: Cardiovascular;  Laterality: Right;     Social History   reports that he has been smoking cigarettes. He has a 13.75 pack-year smoking history. He has never used smokeless tobacco. He reports that he does not currently use alcohol after a past usage of about 2.0 - 3.0 standard drinks per week. He reports that he does not currently use drugs after having used the following drugs: Cocaine. Frequency: 1.00 time per week.   Family History   His family history includes Diabetes in his sister.   Allergies No Known Allergies   Home Medications  Prior to Admission medications   Medication Sig Start Date End Date Taking? Authorizing Provider  acetaminophen (TYLENOL) 500 MG tablet Take 1,000 mg by mouth every 6 (six) hours as needed.  Yes [provider]  apixaban (ELIQUIS) 5 MG TABS tablet Take 1 tablet (5 mg total) by mouth 2 (two) times daily. Patient taking differently: Take 5 mg by mouth every 12 (twelve) hours. 03/07/20  Yes Lurene Shadow, MD  aspirin 81 MG EC tablet Take 81 mg by mouth daily.   Yes [provider]  atorvastatin (LIPITOR) 40 MG tablet Take 1 tablet (40 mg total) by mouth daily at 10 pm. 03/07/20  Yes Lurene Shadow, MD  dapagliflozin propanediol (FARXIGA) 10 MG TABS tablet Take 10 mg by mouth daily. TAKING FOR HIS HEART   Yes [provider]  furosemide (LASIX) 40 MG tablet Take 20 mg by mouth every morning.   Yes [provider]  gabapentin (NEURONTIN) 300 MG capsule TAKE ONE CAPSULE BY MOUTH AT BEDTIME FOR PAIN Patient taking differently: Take 300 mg by mouth at bedtime. 04/16/20  Yes Revelo, Presley Raddle, MD  hydrALAZINE (APRESOLINE) 25 MG tablet Take 25 mg by mouth 3 (three) times daily.   Yes [provider]  isosorbide mononitrate (IMDUR) 30 MG 24 hr tablet Take 30 mg by mouth every morning.   Yes [provider]  metoprolol succinate (TOPROL-XL) 25 MG 24 hr tablet Take 25 mg by mouth every morning.   Yes Rockey Situ, NP  sacubitril-valsartan (ENTRESTO) 49-51 MG Take 1 tablet by mouth 2 (two) times daily. 09/04/20  Yes Clarisa Kindred A, FNP  spironolactone (ALDACTONE) 25 MG tablet Take 1 tablet (25 mg total) by mouth every morning. 06/25/21  Yes Clarisa Kindred A, FNP  thiamine 100 MG tablet Take 1 tablet (100 mg total) by mouth daily. Patient taking differently: Take 125 mg by mouth daily. 03/08/20  Yes Lurene Shadow, MD  Scheduled Meds:  aspirin EC  81 mg Oral Daily   atorvastatin  40 mg Oral Q2200   hydrALAZINE  25 mg Oral TID   [START ON 07/25/2021] insulin aspart  0-15 Units Subcutaneous TID WC   [START ON 07/25/2021] isosorbide mononitrate  30 mg Oral BH-q7a   [START ON 07/25/2021] metoprolol succinate  25 mg Oral BH-q7a   sodium chloride flush  3 mL Intravenous Q12H   Continuous Infusions:  sodium chloride 75 mL/hr at 07/24/21 2300   sodium chloride     [START ON 07/25/2021]  ceFAZolin (ANCEF) IV     PRN Meds:.sodium chloride, acetaminophen, hydrALAZINE, labetalol, morphine injection, ondansetron (ZOFRAN) IV, oxyCODONE, sodium chloride flush    Active Hospital Problem list   Severe PAD of bilateral lower extremity AKI Hypokalemia Hyponatremia CAD Chronic systolic CHF Atrial fibrillation  Assessment & Plan:  Atherosclerosis of native artery of lower extremity with rest  pain S/PFemoral endarterectomy and stent placement. Hx: Bilateral occlusive DVT of lower extremity -PRN Pain management -Neurovascular checks per protocol -Management per vascular   Acute kidney injury Hyperkalemia corrected with sodium bicarb, calcium gluconate dextrose and IV insulin Hyponatremia -Monitor I&O's / urinary output -Follow BMP -Ensure adequate renal perfusion -Avoid nephrotoxic agents as able -Replace electrolytes as indicated  Atrial flutter/atrial fibrillation -Rate control with metoprolol -Continue apixaban postop   Chronic systolic CHF (last known <20%) no evidence of volume overload -Hypertension -Continuous cardiac monitoring -Maintain MAP greater than 65 -Lasix as blood pressure and renal function permits; currently on Lasix 40 mg daily -Continue Imdur, hydralazine, spironolactone, metoprolol as BP and renal functions permit -Repeat 2D Echocardiogram  Coronary Artery Disease  Hx: MI with Cardiac Arrest on (3//21), bilateral carotid artery stenosis - ASA 81mg  PO daily -  HTN, HLD, control as below  HLD  + Goal LDL<100 - Atorvastatin 40mg  PO qhs  HTN  + Goal BP <130/80 Particularly if hx MI to protect against remodeling - Beta-blocker: Metoprolol - ACE-Inhibitor: Entresto as renal function permits   Best practice:  Diet:  Oral Pain/Anxiety/Delirium protocol (if indicated): No VAP protocol (if indicated): Not indicated DVT prophylaxis: Systemic AC GI prophylaxis: PPI Glucose control:  SSI Yes Central venous access:  N/A Arterial line:  N/A Foley:  N/A Mobility:  bed rest  PT consulted: N/A Last date of multidisciplinary goals of care discussion [9/1] Code Status:  full code Disposition: ICU   = Goals of Care = Code Status Order: @CODE @   Primary Emergency Contact: Graciella Freer Wishes to pursue full aggressive treatment and intervention options, including CPR and intubation, goals of care will be addressed on going with family if  that should become necessary.  Critical care time: 45 minutes     Webb Silversmith, DNP, CCRN, FNP-C, AGACNP-BC Acute Care Nurse Practitioner   Pulmonary & Critical Care Medicine Pager: [phone] [facility]  .

## 2021-07-24 NOTE — Interval H&P Note (Signed)
History and Physical Interval Note:  07/24/2021 1:02 PM  Shawn Taylor  has presented today for surgery, with the diagnosis of I70.229 Atherosclerosis with rest pain.  The various methods of treatment have been discussed with the patient and family. After consideration of risks, benefits and other options for treatment, the patient has consented to  Procedure(s) with comments: ENDARTERECTOMY FEMORAL WITH STENT PLACEMENT (Right) - Femoral endarterectomy with stent APPLICATION OF CELL SAVER (N/A) as a surgical intervention.  The patient's history has been reviewed, patient examined, no change in status, stable for surgery.  I have reviewed the patient's chart and labs.  Questions were answered to the patient's satisfaction.     Levora Dredge

## 2021-07-25 ENCOUNTER — Encounter: Payer: Self-pay | Admitting: Vascular Surgery

## 2021-07-25 DIAGNOSIS — I70229 Atherosclerosis of native arteries of extremities with rest pain, unspecified extremity: Secondary | ICD-10-CM

## 2021-07-25 LAB — CBC WITH DIFFERENTIAL/PLATELET
Abs Immature Granulocytes: 0.17 10*3/uL — ABNORMAL HIGH (ref 0.00–0.07)
Basophils Absolute: 0 10*3/uL (ref 0.0–0.1)
Basophils Relative: 0 %
Eosinophils Absolute: 0 10*3/uL (ref 0.0–0.5)
Eosinophils Relative: 0 %
HCT: 41.1 % (ref 39.0–52.0)
Hemoglobin: 13.3 g/dL (ref 13.0–17.0)
Immature Granulocytes: 1 %
Lymphocytes Relative: 9 %
Lymphs Abs: 1.7 10*3/uL (ref 0.7–4.0)
MCH: 28.5 pg (ref 26.0–34.0)
MCHC: 32.4 g/dL (ref 30.0–36.0)
MCV: 88.2 fL (ref 80.0–100.0)
Monocytes Absolute: 0.9 10*3/uL (ref 0.1–1.0)
Monocytes Relative: 5 %
Neutro Abs: 15.4 10*3/uL — ABNORMAL HIGH (ref 1.7–7.7)
Neutrophils Relative %: 85 %
Platelets: 265 10*3/uL (ref 150–400)
RBC: 4.66 MIL/uL (ref 4.22–5.81)
RDW: 14.6 % (ref 11.5–15.5)
WBC: 18.2 10*3/uL — ABNORMAL HIGH (ref 4.0–10.5)
nRBC: 0 % (ref 0.0–0.2)

## 2021-07-25 LAB — GLUCOSE, CAPILLARY
Glucose-Capillary: 128 mg/dL — ABNORMAL HIGH (ref 70–99)
Glucose-Capillary: 147 mg/dL — ABNORMAL HIGH (ref 70–99)
Glucose-Capillary: 166 mg/dL — ABNORMAL HIGH (ref 70–99)
Glucose-Capillary: 121 mg/dL — ABNORMAL HIGH (ref 70–99)
Glucose-Capillary: 129 mg/dL — ABNORMAL HIGH (ref 70–99)

## 2021-07-25 LAB — BASIC METABOLIC PANEL
Anion gap: 7 (ref 5–15)
BUN: 27 mg/dL — ABNORMAL HIGH (ref 8–23)
CO2: 20 mmol/L — ABNORMAL LOW (ref 22–32)
Calcium: 8.8 mg/dL — ABNORMAL LOW (ref 8.9–10.3)
Chloride: 107 mmol/L (ref 98–111)
Creatinine, Ser: 1.15 mg/dL (ref 0.61–1.24)
GFR, Estimated: >60 mL/min (ref >60)
Glucose, Bld: 179 mg/dL — ABNORMAL HIGH (ref 70–99)
Potassium: 5.1 mmol/L (ref 3.5–5.1)
Sodium: 134 mmol/L — ABNORMAL LOW (ref 135–145)

## 2021-07-25 LAB — POTASSIUM: Potassium: 5.7 mmol/L — ABNORMAL HIGH (ref 3.5–5.1)

## 2021-07-25 LAB — MAGNESIUM
Magnesium: 2 mg/dL (ref 1.7–2.4)
Magnesium: 2.1 mg/dL (ref 1.7–2.4)

## 2021-07-25 MED ORDER — CHLORHEXIDINE GLUCONATE CLOTH 2 % EX PADS
6.0000 | MEDICATED_PAD | Freq: Every day | CUTANEOUS | Status: DC
  Administered 2021-07-25: 6 via TOPICAL

## 2021-07-25 MED ORDER — CLOPIDOGREL BISULFATE 75 MG PO TABS
75.0000 mg | ORAL_TABLET | Freq: Every day | ORAL | Status: DC
  Administered 2021-07-25 – 2021-07-27 (×3): 75 mg via ORAL
  Filled 2021-07-25 (×3): qty 1

## 2021-07-25 NOTE — Progress Notes (Signed)
Attempt to call handoff report to nurse taking pt in room 120, request to call back in approx 20 minutes.

## 2021-07-25 NOTE — Progress Notes (Signed)
Pt with foley during start of shift, no order found for foley. Did see provider Stegmayer 9/1 1631 note that foley can be removed. Removed foley at appprox 2140. Pt transferred to room 120, bedside handoff with new primary RN Kayla.

## 2021-07-25 NOTE — Anesthesia Postprocedure Evaluation (Signed)
Anesthesia Post Note  Patient: Shawn Taylor  Procedure(s) Performed: ENDARTERECTOMY FEMORAL WITH STENT PLACEMENT (Right) APPLICATION OF CELL SAVER  Patient location during evaluation: ICU Anesthesia Type: General Level of consciousness: awake Pain management: pain level controlled Vital Signs Assessment: post-procedure vital signs reviewed and stable Respiratory status: spontaneous breathing and respiratory function stable Cardiovascular status: stable Postop Assessment: no apparent nausea or vomiting Anesthetic complications: no   No notable events documented.   Last Vitals:  Vitals:   07/25/21 0600 07/25/21 0700  BP: (!) 145/67 132/63  Pulse: 61 66  Resp: 18 12  Temp:    SpO2: 97% 96%    Last Pain:  Vitals:   07/25/21 0543  TempSrc:   PainSc: (P) 6                  Rica Mast

## 2021-07-25 NOTE — Evaluation (Signed)
Occupational Therapy Evaluation Patient Details Name: Shawn Taylor MRN: [id number] DOB: 1956/02/07 Today's Date: 07/25/2021    History of Present Illness a 65 year old male with PMH: CAD, MI, cardiac arrest, atrial flutter, HFrEF, PAD, carotid artery stenosis, DVT, HTN, HLD, DOE, HCV (s/p Harvoni course), polysubstance abuse (ETOH and cocaine), and severe atherosclerotic disease with rest pain to the right lower extremity status post femoral endarterectomy / right lower extremity angiogram on 07/24/21.   Clinical Impression   [name] was seen for OT evaluation this date. Prior to hospital admission, pt was Independent for mobility and ADLs. Pt lives alone with family available PRN. Pt presents to acute OT demonstrating impaired ADL performance and functional mobility 2/2 decreased activity tolerance and decreased LB access. Pt currently requires MOD I don L sock, MAX A don R sock - limited by pain. Pt reports plan to have son assist as needed. CGA for ADL t/f - assist for lines mgmt and pain. RN delivered pain medicine during session. Pt would benefit from skilled OT to address noted impairments and functional limitations (see below for any additional details) in order to maximize safety and independence while minimizing falls risk and caregiver burden. Upon hospital discharge,anticipate no OT follow up.     Follow Up Recommendations  No OT follow up;Supervision - Intermittent    Equipment Recommendations  None recommended by OT    Recommendations for Other Services       Precautions / Restrictions Precautions Precautions: Fall Restrictions Weight Bearing Restrictions: No      Mobility Bed Mobility Overal bed mobility: Needs Assistance Bed Mobility: Supine to Sit     Supine to sit: Supervision     General bed mobility comments: increased time    Transfers Overall transfer level: Needs assistance   Transfers: Sit to/from Stand Sit to Stand: Min guard               Balance Overall balance assessment: Needs assistance Sitting-balance support: No upper extremity supported;Feet supported Sitting balance-Leahy Scale: Good     Standing balance support: No upper extremity supported;During functional activity Standing balance-Leahy Scale: Good                             ADL either performed or assessed with clinical judgement   ADL Overall ADL's : Needs assistance/impaired                                       General ADL Comments: MOD I don L sock, MAX A don R sock - limited by pain. Pt reports plan to have son assist as needed. CGA for ADL t/f - assist for lines mgmt and pain      Pertinent Vitals/Pain Pain Assessment: Faces Faces Pain Scale: Hurts even more Pain Location: RLE Pain Descriptors / Indicators: Dull;Grimacing;Operative site guarding Pain Intervention(s): RN gave pain meds during session;Limited activity within patient's tolerance     Hand Dominance Right   Extremity/Trunk Assessment Upper Extremity Assessment Upper Extremity Assessment: Overall WFL for tasks assessed   Lower Extremity Assessment Lower Extremity Assessment: Overall WFL for tasks assessed       Communication Communication Communication: No difficulties   Cognition Arousal/Alertness: Awake/alert Behavior During Therapy: WFL for tasks assessed/performed Overall Cognitive Status: Within Functional Limits for tasks assessed  General Comments  VSS t/o    Exercises Exercises: Other exercises Other Exercises Other Exercises: Pt educated re: OT role, DME recs, d/c recs, falls prevention Other Exercises: LBD, sup>sit, sit<>stand x2, sitting/standing balance/tolerance   Shoulder Instructions      Home Living Family/patient expects to be discharged to:: Private residence Living Arrangements: Alone Available Help at Discharge: Family;Available PRN/intermittently (son) Type of  Home: House Home Access: Stairs to enter [employer] of Steps: 2 Entrance Stairs-Rails: Can reach both Home Layout: One level         Bathroom Toilet: Standard     Home Equipment: None          Prior Functioning/Environment Level of Independence: Independent        Comments: Pt does not drive but son takes him and he is able to ambulate in the community. no AD use        OT Problem List: Decreased strength;Decreased range of motion      OT Treatment/Interventions: Self-care/ADL training;Therapeutic exercise;Energy conservation;DME and/or AE instruction;Therapeutic activities;Patient/family education;Balance training    OT Goals(Current goals can be found in the care plan section) Acute Rehab OT Goals Patient Stated Goal: to go home and improve pain OT Goal Formulation: With patient Time For Goal Achievement: 08/08/21 Potential to Achieve Goals: Good ADL Goals Pt Will Perform Grooming: Independently;standing Pt Will Perform Lower Body Dressing: Independently;sit to/from stand Pt Will Transfer to Toilet: Independently;ambulating;regular height toilet  OT Frequency: Min 1X/week    AM-PAC OT "6 Clicks" Daily Activity     Outcome Measure Help from another person eating meals?: None Help from another person taking care of personal grooming?: None Help from another person toileting, which includes using toliet, bedpan, or urinal?: A Little Help from another person bathing (including washing, rinsing, drying)?: None Help from another person to put on and taking off regular upper body clothing?: None Help from another person to put on and taking off regular lower body clothing?: A Lot 6 Click Score: 21   End of Session Nurse Communication: Mobility status  Activity Tolerance: Patient tolerated treatment well Patient left: in chair;with call bell/phone within reach  OT Visit Diagnosis: Other abnormalities of gait and mobility (R26.89);Muscle weakness  (generalized) (M62.81)                Time: 8182-9937 OT Time Calculation (min): 21 min Charges:  OT General Charges $OT Visit: 1 Visit OT Evaluation $OT Eval Low Complexity: 1 Low OT Treatments $Self Care/Home Management : 8-22 mins  Kathie Dike, M.S. OTR/L  07/25/21, 4:31 PM  ascom [phone]

## 2021-07-25 NOTE — Anesthesia Preprocedure Evaluation (Signed)
Anesthesia Evaluation  Patient identified by MRN, date of birth, ID band Patient awake    Reviewed: Allergy & Precautions, H&P , NPO status , Patient's Chart, lab work & pertinent test results, reviewed documented beta blocker date and time   Airway Mallampati: II  TM Distance: >3 FB Neck ROM: full    Dental  (+) Teeth Intact   Pulmonary neg pulmonary ROS, Current Smoker and Patient abstained from smoking.,    Pulmonary exam normal        Cardiovascular Exercise Tolerance: Poor hypertension, On Medications + CAD, + Past MI, + Peripheral Vascular Disease, +CHF and + DOE  Normal cardiovascular exam Rate:Normal     Neuro/Psych  Headaches, negative psych ROS   GI/Hepatic negative GI ROS, (+) Hepatitis -  Endo/Other  negative endocrine ROS  Renal/GU Renal disease  negative genitourinary   Musculoskeletal   Abdominal   Peds  Hematology negative hematology ROS (+)   Anesthesia Other Findings   Reproductive/Obstetrics negative OB ROS                             Anesthesia Physical Anesthesia Plan  ASA: 4  Anesthesia Plan: General LMA   Post-op Pain Management:    Induction:   PONV Risk Score and Plan:   Airway Management Planned:   Additional Equipment:   Intra-op Plan:   Post-operative Plan:   Informed Consent: I have reviewed the patients History and Physical, chart, labs and discussed the procedure including the risks, benefits and alternatives for the proposed anesthesia with the patient or authorized representative who has indicated his/her understanding and acceptance.       Plan Discussed with: CRNA  Anesthesia Plan Comments:         Anesthesia Quick Evaluation

## 2021-07-25 NOTE — Progress Notes (Signed)
[facility] Daily Progress Note  07/24/21: RLE Femoral Endarterectomy RLE angiogram with intervention  Subjective: Patient without complaint this AM.  Complaining of some right groin incisional soreness.  No acute issues overnight.  Objective: Vitals:   07/25/21 0800 07/25/21 0900 07/25/21 1000 07/25/21 1100  BP: (!) 150/66 (!) 154/130 137/71 (!) 148/94  Pulse: 68 69 66 67  Resp: 12 13 18 17   Temp: 97.7 F (36.5 C)     TempSrc:      SpO2: 98% 99% 100% 98%  Weight:      Height:        Intake/Output Summary (Last 24 hours) at 07/25/2021 1127 Last data filed at 07/25/2021 0900 Gross per 24 hour  Intake 2041.61 ml  Output 2550 ml  Net -508.39 ml   Physical Exam: A&Ox3, NAD CV: RRR Pulmonary: CTA Bilaterally Abdomen: Soft, Nontender, Nondistended Vascular:  Right lower extremity: Incision with operating room dressing intact.  Clean and dry.  Thigh soft.  Calf soft.  Extremities warm distally toes.  Good capillary refill.  Minimal edema.  Motor/sensory is intact.   Laboratory: CBC    Component Value Date/Time   WBC 18.2 (H) 07/25/2021 0551   HGB 13.3 07/25/2021 0551   HCT 41.1 07/25/2021 0551   PLT 265 07/25/2021 0551   BMET    Component Value Date/Time   NA 134 (L) 07/25/2021 0551   K 5.1 07/25/2021 0551   CL 107 07/25/2021 0551   CO2 20 (L) 07/25/2021 0551   GLUCOSE 179 (H) 07/25/2021 0551   BUN 27 (H) 07/25/2021 0551   CREATININE 1.15 07/25/2021 0551   CALCIUM 8.8 (L) 07/25/2021 0551   GFRNONAA >60 07/25/2021 0551   GFRAA >60 05/29/2020 1254   Assessment/Planning: The patient is a 65 year old male multiple medical issues including severe atherosclerotic disease with rest pain to the right lower extremity status post femoral endarterectomy / right lower extremity angiogram with intervention - POD#1  1) Hemoglobin stable.  Renal function stable. 2) on aspirin and statin for medical management.  Will add Plavix. 3) Ok to remove foley 4)  OOB / PT / OT 5) Vitals stable will transfer out of the unti  Discussed with 77 PA-C 07/25/2021 11:27 AM

## 2021-07-26 LAB — BASIC METABOLIC PANEL
Anion gap: 5 (ref 5–15)
BUN: 18 mg/dL (ref 8–23)
CO2: 23 mmol/L (ref 22–32)
Calcium: 8.7 mg/dL — ABNORMAL LOW (ref 8.9–10.3)
Chloride: 106 mmol/L (ref 98–111)
Creatinine, Ser: 1.07 mg/dL (ref 0.61–1.24)
GFR, Estimated: >60 mL/min (ref >60)
Glucose, Bld: 124 mg/dL — ABNORMAL HIGH (ref 70–99)
Potassium: 4.5 mmol/L (ref 3.5–5.1)
Sodium: 134 mmol/L — ABNORMAL LOW (ref 135–145)

## 2021-07-26 LAB — CBC
HCT: 41.6 % (ref 39.0–52.0)
Hemoglobin: 13.9 g/dL (ref 13.0–17.0)
MCH: 29.7 pg (ref 26.0–34.0)
MCHC: 33.4 g/dL (ref 30.0–36.0)
MCV: 88.9 fL (ref 80.0–100.0)
Platelets: 248 10*3/uL (ref 150–400)
RBC: 4.68 MIL/uL (ref 4.22–5.81)
RDW: 14.8 % (ref 11.5–15.5)
WBC: 18 10*3/uL — ABNORMAL HIGH (ref 4.0–10.5)
nRBC: 0 % (ref 0.0–0.2)

## 2021-07-26 LAB — GLUCOSE, CAPILLARY
Glucose-Capillary: 126 mg/dL — ABNORMAL HIGH (ref 70–99)
Glucose-Capillary: 130 mg/dL — ABNORMAL HIGH (ref 70–99)
Glucose-Capillary: 130 mg/dL — ABNORMAL HIGH (ref 70–99)
Glucose-Capillary: 135 mg/dL — ABNORMAL HIGH (ref 70–99)

## 2021-07-26 LAB — SURGICAL PATHOLOGY

## 2021-07-26 NOTE — Evaluation (Signed)
Physical Therapy Evaluation Patient Details Name: Shawn Taylor MRN: [id number] DOB: [birth year] Today's Date: 07/26/2021   History of Present Illness  Pt is a 65 year old male with PMH: CAD, MI, cardiac arrest, atrial flutter, HFrEF, PAD, carotid artery stenosis, DVT, HTN, HLD, DOE, HCV (s/p Harvoni course), polysubstance abuse (ETOH and cocaine), and severe atherosclerotic disease with rest pain to the right lower extremity. Pt status post femoral endarterectomy / right lower extremity angiogram on 07/24/21.   Clinical Impression  Pt was pleasant and motivated to participate during the session and overall performed well during the session.  Pt required some extra time and effort with functional tasks and preferred to ambulate with the RW secondary to feeling weaker than baseline.  Pt was steady with amb with the RW with SpO2 and HR WNL. Pt will benefit from HHPT upon discharge to safely address deficits listed in patient problem list for decreased caregiver assistance and eventual return to PLOF.      Follow Up Recommendations Home health PT;Supervision - Intermittent    Equipment Recommendations  None recommended by PT    Recommendations for Other Services       Precautions / Restrictions Precautions Precautions: Fall Restrictions Weight Bearing Restrictions: No      Mobility  Bed Mobility Overal bed mobility: Modified Independent             General bed mobility comments: Extra time and effort only    Transfers Overall transfer level: Needs assistance Equipment used: Rolling walker (2 wheeled) Transfers: Sit to/from Stand Sit to Stand: Min guard         General transfer comment: Fair to good eccentric control and stability  Ambulation/Gait Ambulation/Gait assistance: Min guard;Supervision Gait Distance (Feet): 150 Feet Assistive device: Rolling walker (2 wheeled) Gait Pattern/deviations: Step-through pattern;Decreased step length - right;Decreased step  length - left Gait velocity: decreased   General Gait Details: Slow cadence with decreased step length but steady without LOB; pt with min lean on the RW for support and reported subjectively feeling somewhat weaker than at baseline with recommendation made to use RW initially upon dishcarge home  Stairs            Wheelchair Mobility    Modified Rankin (Stroke Patients Only)       Balance Overall balance assessment: Needs assistance Sitting-balance support: No upper extremity supported;Feet supported Sitting balance-Leahy Scale: Good     Standing balance support: During functional activity;Bilateral upper extremity supported Standing balance-Leahy Scale: Good                               Pertinent Vitals/Pain Pain Assessment: No/denies pain    Home Living Family/patient expects to be discharged to:: Private residence Living Arrangements: Alone Available Help at Discharge: Family;Available 24 hours/day;Other (Comment) (Son to stay with pt 24/7 upon d/c) Type of Home: House Home Access: Stairs to enter Entrance Stairs-Rails: Right Entrance Stairs-Number of Steps: 2 Home Layout: One level Home Equipment: Walker - 2 wheels      Prior Function Level of Independence: Independent         Comments: Ind amb community distances, Ind with ADLs, active outdoors and loves to fish "sometimes all day", no fall history     Hand Dominance   Dominant Hand: Right    Extremity/Trunk Assessment   Upper Extremity Assessment Upper Extremity Assessment: Overall WFL for tasks assessed    Lower Extremity Assessment Lower Extremity Assessment: Generalized weakness  Communication   Communication: No difficulties  Cognition Arousal/Alertness: Awake/alert Behavior During Therapy: WFL for tasks assessed/performed Overall Cognitive Status: Within Functional Limits for tasks assessed                                        General  Comments      Exercises Total Joint Exercises Ankle Circles/Pumps: AROM;Strengthening;Both;10 reps Quad Sets: 10 reps;Both;Strengthening Gluteal Sets: Strengthening;Both;10 reps Hip ABduction/ADduction: AROM;Strengthening;Both;5 reps Long Arc Quad: AROM;Strengthening;Both;10 reps Marching in Standing: AROM;Strengthening;Both;5 reps;Standing   Assessment/Plan    PT Assessment Patient needs continued PT services  PT Problem List Decreased strength;Decreased activity tolerance;Decreased balance;Decreased mobility;Decreased knowledge of use of DME       PT Treatment Interventions DME instruction;Gait training;Therapeutic activities;Functional mobility training;Therapeutic exercise;Stair training;Patient/family education    PT Goals (Current goals can be found in the Care Plan section)  Acute Rehab PT Goals Patient Stated Goal: To get back to fishing PT Goal Formulation: With patient Time For Goal Achievement: 08/08/21 Potential to Achieve Goals: Good    Frequency Min 2X/week   Barriers to discharge        Co-evaluation               AM-PAC PT "6 Clicks" Mobility  Outcome Measure Help needed turning from your back to your side while in a flat bed without using bedrails?: None Help needed moving from lying on your back to sitting on the side of a flat bed without using bedrails?: None Help needed moving to and from a bed to a chair (including a wheelchair)?: A Little Help needed standing up from a chair using your arms (e.g., wheelchair or bedside chair)?: A Little Help needed to walk in hospital room?: A Little Help needed climbing 3-5 steps with a railing? : A Little 6 Click Score: 20    End of Session Equipment Utilized During Treatment: Gait belt Activity Tolerance: Patient tolerated treatment well Patient left: in chair;with call bell/phone within reach;with chair alarm set Nurse Communication: Mobility status PT Visit Diagnosis: Muscle weakness (generalized)  (M62.81);Difficulty in walking, not elsewhere classified (R26.2)    Time: 0272-5366 PT Time Calculation (min) (ACUTE ONLY): 27 min   Charges:   PT Evaluation $PT Eval Moderate Complexity: 1 Mod PT Treatments $Therapeutic Exercise: 8-22 mins        D. Scott Billyjoe Go PT, DPT 07/26/21, 11:52 AM

## 2021-07-26 NOTE — TOC Progression Note (Signed)
Transition of Care [facility]) - Progression Note    Patient Details  Name: Shawn Taylor MRN: [id number] Date of Birth: 03/30/1956  [facility] of [facility]) CM/SW Contact  Caryn Section, RN Phone Number: 07/26/2021, 4:07 PM  Clinical Narrative:   Patient lives at home alone, but son will stay with him over the weekend, and daughter is also available as well and she will assist.  Patient does not verbalize concerns about transporting to apointments, or getting medications.  He states he takes medications as directed.  He has a 2 wheeled walker and a BSC at home.  Patient has medicaid and requires home health for incision care and PT.  Patient had a femoral endartarectomy and is unsteady on his feet.  His insurance is Medicaid and so far, no HH agencies will accept Medicaid.  Care team, and patient made aware.  Patient plans to discharge tomorrow.  tOC to follow to discharge.         Expected Discharge Plan and Services                                                 Social Determinants of Health (SDOH) Interventions    Readmission Risk Interventions Readmission Risk Prevention Plan 03/07/2020  Transportation Screening Complete  PCP or Specialist appointment within 3-5 days of discharge Complete  HRI or Home Care Consult Complete  SW Recovery Care/Counseling Consult Complete  Palliative Care Screening Complete  Skilled Nursing Facility Not Applicable  Some recent data might be hidden

## 2021-07-26 NOTE — Progress Notes (Addendum)
[facility] Daily Progress Note   07/24/21: RLE Femoral Endarterectomy RLE angiogram with intervention  Subjective: Patient without complaint this AM.  No acute issues overnight.  Objective: Vitals:   07/25/21 2220 07/26/21 0647 07/26/21 0838 07/26/21 1434  BP: (!) 154/81 (!) 170/73 (!) 147/68 (!) 142/83  Pulse: 86 79 78 86  Resp: 20 16 18 17   Temp: 98.7 F (37.1 C) 98 F (36.7 C) 97.7 F (36.5 C) 98.2 F (36.8 C)  TempSrc: Oral Oral Oral Oral  SpO2: 100% 98% 96% 99%  Weight:      Height:        Intake/Output Summary (Last 24 hours) at 07/26/2021 1442 Last data filed at 07/26/2021 09/25/2021 Gross per 24 hour  Intake 529.91 ml  Output 875 ml  Net -345.09 ml   Physical Exam: A&Ox3, NAD CV: RRR Pulmonary: CTA Bilaterally Abdomen: Soft, Nontender, Nondistended Vascular:             Right lower extremity: Incision with operating room dressing intact.  Clean and dry.  Thigh soft.  Calf soft.  Extremities warm distally toes.  Good capillary refill.  Minimal edema.  Motor/sensory is intact.   Laboratory: CBC    Component Value Date/Time   WBC 18.0 (H) 07/26/2021 0527   HGB 13.9 07/26/2021 0527   HCT 41.6 07/26/2021 0527   PLT 248 07/26/2021 0527   BMET    Component Value Date/Time   NA 134 (L) 07/26/2021 0527   K 4.5 07/26/2021 0527   CL 106 07/26/2021 0527   CO2 23 07/26/2021 0527   GLUCOSE 124 (H) 07/26/2021 0527   BUN 18 07/26/2021 0527   CREATININE 1.07 07/26/2021 0527   CALCIUM 8.7 (L) 07/26/2021 0527   GFRNONAA >60 07/26/2021 0527   GFRAA >60 05/29/2020 1254   Assessment/Planning: The patient is a 65 year old male multiple medical issues including severe atherosclerotic disease with rest pain to the right lower extremity status post femoral endarterectomy / right lower extremity angiogram with intervention - POD#2  1) No acute issues overnight.  2) Vital stable / making urine.  3) Normal renal function 4) Hemoglobin stable 5) Increased  leukocytosis. 18.0 today. AM CBC 6) patient has been working with PT and OT who deemed him appropriate for discharge home with home services. 7) patient is on Plavix, ASA and statin for medical management.   8) completed a face-to-face order for social work in regard to the patient receiving visiting nurse services as well as home PT. 9) patient is doing well overall and believes he will be ready for discharge tomorrow a.m.  Discussed with Dr. 77 Duane Earnshaw PA-C 07/26/2021 2:42 PM

## 2021-07-27 LAB — BASIC METABOLIC PANEL
Anion gap: 6 (ref 5–15)
BUN: 19 mg/dL (ref 8–23)
CO2: 23 mmol/L (ref 22–32)
Calcium: 8.6 mg/dL — ABNORMAL LOW (ref 8.9–10.3)
Chloride: 108 mmol/L (ref 98–111)
Creatinine, Ser: 1.09 mg/dL (ref 0.61–1.24)
GFR, Estimated: >60 mL/min (ref >60)
Glucose, Bld: 114 mg/dL — ABNORMAL HIGH (ref 70–99)
Potassium: 4.3 mmol/L (ref 3.5–5.1)
Sodium: 137 mmol/L (ref 135–145)

## 2021-07-27 LAB — CBC
HCT: 38.8 % — ABNORMAL LOW (ref 39.0–52.0)
Hemoglobin: 13 g/dL (ref 13.0–17.0)
MCH: 29.8 pg (ref 26.0–34.0)
MCHC: 33.5 g/dL (ref 30.0–36.0)
MCV: 89 fL (ref 80.0–100.0)
Platelets: 238 10*3/uL (ref 150–400)
RBC: 4.36 MIL/uL (ref 4.22–5.81)
RDW: 14.6 % (ref 11.5–15.5)
WBC: 18.5 10*3/uL — ABNORMAL HIGH (ref 4.0–10.5)
nRBC: 0 % (ref 0.0–0.2)

## 2021-07-27 LAB — GLUCOSE, CAPILLARY
Glucose-Capillary: 128 mg/dL — ABNORMAL HIGH (ref 70–99)
Glucose-Capillary: 120 mg/dL — ABNORMAL HIGH (ref 70–99)

## 2021-07-27 MED ORDER — CLOPIDOGREL BISULFATE 75 MG PO TABS: 75.0000 mg | ORAL_TABLET | Freq: Every day | ORAL | 0 refills | Status: DC

## 2021-07-27 MED ORDER — OXYCODONE HCL 5 MG PO TABS: 5.0000 mg | ORAL_TABLET | ORAL | 0 refills | Status: DC | PRN

## 2021-07-27 NOTE — Discharge Summary (Signed)
Physician Discharge Summary  Patient ID: Shawn Taylor MRN: [id number] DOB/AGE: [birth year] 65 y.o.  Admit date: 07/24/2021 Discharge date: 07/27/2021  Admission Diagnoses:  Discharge Diagnoses:  Active Problems:   Atherosclerosis of native arteries of the extremities with ulceration [facility])   Discharged Condition: good  Hospital Course: admitted following right femoral endarterectomy and stenting.  Uncomplicated hospital course  Consults: None   Discharge Exam: Blood pressure 136/77, pulse 83, temperature 98 F (36.7 C), temperature source Oral, resp. rate 17, height 5\' 8"  (1.727 m), weight 91.6 kg, SpO2 100 %. Groin soft , palpable pulses  Disposition:    Allergies as of 07/27/2021   No Known Allergies      Medication List     TAKE these medications    acetaminophen 500 MG tablet Commonly known as: TYLENOL Take 1,000 mg by mouth every 6 (six) hours as needed.   apixaban 5 MG Tabs tablet Commonly known as: ELIQUIS Take 1 tablet (5 mg total) by mouth 2 (two) times daily. What changed: when to take this   aspirin 81 MG EC tablet Take 81 mg by mouth daily.   atorvastatin 40 MG tablet Commonly known as: LIPITOR Take 1 tablet (40 mg total) by mouth daily at 10 pm.   clopidogrel 75 MG tablet Commonly known as: PLAVIX Take 1 tablet (75 mg total) by mouth daily. Start taking on: July 28, 2021   dapagliflozin propanediol 10 MG Tabs tablet Commonly known as: FARXIGA Take 10 mg by mouth daily. TAKING FOR HIS HEART   furosemide 40 MG tablet Commonly known as: LASIX Take 20 mg by mouth every morning.   gabapentin 300 MG capsule Commonly known as: NEURONTIN TAKE ONE CAPSULE BY MOUTH AT BEDTIME FOR PAIN What changed:  how much to take how to take this when to take this   hydrALAZINE 25 MG tablet Commonly known as: APRESOLINE Take 25 mg by mouth 3 (three) times daily.   isosorbide mononitrate 30 MG 24 hr tablet Commonly known as: IMDUR Take 30 mg by  mouth every morning.   metoprolol succinate 25 MG 24 hr tablet Commonly known as: TOPROL-XL Take 25 mg by mouth every morning.   oxyCODONE 5 MG immediate release tablet Commonly known as: Oxy IR/ROXICODONE Take 1-2 tablets (5-10 mg total) by mouth every 4 (four) hours as needed for moderate pain.   sacubitril-valsartan 49-51 MG Commonly known as: ENTRESTO Take 1 tablet by mouth 2 (two) times daily.   spironolactone 25 MG tablet Commonly known as: ALDACTONE Take 1 tablet (25 mg total) by mouth every morning.   thiamine 100 MG tablet Take 1 tablet (100 mg total) by mouth daily. What changed: how much to take         Signed: Wells Canon Gola 07/27/2021, 1:20 PM

## 2021-07-27 NOTE — Progress Notes (Signed)
Received MD order to discharge patient to home, reviewed homes meds,  prescriptions, discharge instructions and follow up appointments with patient and patient verbalized understanding, Patient is aware that [facility] is to call him next week to schedule a follow up appointment.

## 2021-07-30 ENCOUNTER — Other Ambulatory Visit (INDEPENDENT_AMBULATORY_CARE_PROVIDER_SITE_OTHER): Payer: Self-pay | Admitting: Nurse Practitioner

## 2021-07-30 ENCOUNTER — Telehealth (INDEPENDENT_AMBULATORY_CARE_PROVIDER_SITE_OTHER): Payer: Self-pay

## 2021-07-30 MED ORDER — OXYCODONE HCL 5 MG PO TABS: 5.0000 mg | ORAL_TABLET | Freq: Four times a day (QID) | ORAL | 0 refills | Status: DC | PRN

## 2021-07-30 NOTE — Telephone Encounter (Signed)
Refill sent.

## 2021-08-08 ENCOUNTER — Encounter (INDEPENDENT_AMBULATORY_CARE_PROVIDER_SITE_OTHER): Payer: Self-pay | Admitting: Nurse Practitioner

## 2021-08-08 ENCOUNTER — Ambulatory Visit (INDEPENDENT_AMBULATORY_CARE_PROVIDER_SITE_OTHER): Payer: Medicaid Other | Admitting: Nurse Practitioner

## 2021-08-08 ENCOUNTER — Other Ambulatory Visit: Payer: Self-pay

## 2021-08-08 VITALS — BP 98/69 | HR 94 | Ht 68.0 in | Wt 191.0 lb

## 2021-08-08 DIAGNOSIS — I70229 Atherosclerosis of native arteries of extremities with rest pain, unspecified extremity: Secondary | ICD-10-CM

## 2021-08-08 MED ORDER — SILVER SULFADIAZINE 1 % EX CREA: 1.0000 "application " | TOPICAL_CREAM | Freq: Every day | CUTANEOUS | 0 refills | Status: DC

## 2021-08-08 MED ORDER — OXYCODONE HCL 5 MG PO TABS: 5.0000 mg | ORAL_TABLET | ORAL | 0 refills | Status: DC | PRN

## 2021-08-08 NOTE — Progress Notes (Signed)
Subjective:    Patient ID: Shawn Taylor, adult    DOB: 07/19/1956, 65 y.o.   MRN: [id number] Chief Complaint  Patient presents with   Follow-up    2 week post OP wound  check     Shawn Taylor is a 65 year old male that returns today for evaluation of right femoral endarterectomy.  The patient does continue to note postsurgical pain within the right lower extremity.  There is some slight dehiscence of his wound.  Denies any drainage or fevers and chills.  Denies any chest pain or shortness of breath.   Review of Systems  Skin:  Positive for wound.  All other systems reviewed and are negative.     Objective:   Physical Exam Vitals reviewed.  HENT:     Head: Normocephalic.  Cardiovascular:     Rate and Rhythm: Normal rate.     Pulses:          Dorsalis pedis pulses are 1+ on the right side and 1+ on the left side.  Pulmonary:     Effort: Pulmonary effort is normal.  Skin:    Findings: Wound present.  Neurological:     Mental Status: He is alert and oriented to person, place, and time.  Psychiatric:        Mood and Affect: Mood normal.        Behavior: Behavior normal.        Thought Content: Thought content normal.        Judgment: Judgment normal.    BP 98/69   Pulse 94   Ht 5\' 8"  (1.727 m)   Wt 191 lb (86.6 kg)   BMI 29.04 kg/m   Past Medical History:  Diagnosis Date   Atrial flutter (HCC)    CAD (coronary artery disease)    Cardiac arrest (HCC) 02/12/2020   WITH HIS MI   Carotid artery occlusion    Cervical myelopathy (HCC)    CHF (congestive heart failure) (HCC)    Chronic anticoagulation    Apixaban + ASA   DOE (dyspnea on exertion)    DVT (deep venous thrombosis) (HCC)    HCV (hepatitis C virus) 2019   Tx'd with Harvoni course   HLD (hyperlipidemia)    Hypertension    Migraines    Myocardial infarction (HCC) 02/12/2020   PAD (peripheral artery disease) (HCC)    Polysubstance abuse (HCC)    Cocaine and ETOH   Thrombocytopenia (HCC)      Social History   Socioeconomic History   Marital status: Single    Spouse name: Not on file   Number of children: 6   Years of education: Not on file   Highest education level: Not on file  Occupational History   Not on file  Tobacco Use   Smoking status: Every Day    Packs/day: 0.25    Years: 55.00    Pack years: 13.75    Types: Cigarettes   Smokeless tobacco: Never  Vaping Use   Vaping Use: Never used  Substance and Sexual Activity   Alcohol use: Not Currently    Alcohol/week: 2.0 - 3.0 standard drinks    Types: 2 - 3 Cans of beer per week   Drug use: Not Currently    Frequency: 1.0 times per week    Types: Cocaine    Comment: last documented + cocaine UDS was 10/2016   Sexual activity: Not on file  Other Topics Concern   Not on file  Social History  Narrative   Not on file   Social Determinants of Health   Financial Resource Strain: Not on file  Food Insecurity: Not on file  Transportation Needs: Not on file  Physical Activity: Not on file  Stress: Not on file  Social Connections: Not on file  Intimate Partner Violence: Not on file    Past Surgical History:  Procedure Laterality Date   ANTERIOR CERVICAL CORPECTOMY N/A 04/17/2021   Procedure: ANTERIOR CERVICAL CORPECTOMY C4;  Surgeon: Venetia Night, MD;  Location: [facility];  Service: Neurosurgery;  Laterality: N/A;  2nd case   ANTERIOR CERVICAL DECOMP/DISCECTOMY FUSION N/A 04/17/2021   Procedure: C5-6 ANTERIOR CERVICAL DECOMPRESSION/DISCECTOMY FUSION;  Surgeon: Venetia Night, MD;  Location: [facility];  Service: Neurosurgery;  Laterality: N/A;   ENDARTERECTOMY FEMORAL Right 07/24/2021   Procedure: ENDARTERECTOMY FEMORAL WITH STENT PLACEMENT;  Surgeon: Renford Dills, MD;  Location: [facility];  Service: Vascular;  Laterality: Right;  Femoral endarterectomy with stent   LOWER EXTREMITY ANGIOGRAPHY Right 01/29/2021   Procedure: LOWER EXTREMITY ANGIOGRAPHY;  Surgeon: Renford Dills, MD;  Location:  [facility];  Service: Cardiovascular;  Laterality: Right;    Family History  Problem Relation Age of Onset   Diabetes Sister     No Known Allergies  CBC Latest Ref Rng & Units 07/27/2021 07/26/2021 07/25/2021  WBC 4.0 - 10.5 K/uL 18.5(H) 18.0(H) 18.2(H)  Hemoglobin 13.0 - 17.0 g/dL 37.7 93.9 68.8  Hematocrit 39.0 - 52.0 % 38.8(L) 41.6 41.1  Platelets 150 - 400 K/uL 238 248 265      CMP     Component Value Date/Time   NA 137 07/27/2021 0526   K 4.3 07/27/2021 0526   CL 108 07/27/2021 0526   CO2 23 07/27/2021 0526   GLUCOSE 114 (H) 07/27/2021 0526   BUN 19 07/27/2021 0526   CREATININE 1.09 07/27/2021 0526   CALCIUM 8.6 (L) 07/27/2021 0526   PROT 6.8 07/24/2021 1853   ALBUMIN 3.4 (L) 07/24/2021 1853   AST 16 07/24/2021 1853   ALT 15 07/24/2021 1853   ALKPHOS 105 07/24/2021 1853   BILITOT 0.7 07/24/2021 1853   GFRNONAA >60 07/27/2021 0526   GFRAA >60 05/29/2020 1254     No results found.     Assessment & Plan:   1. 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  The patient has some slightly open areas in the groin he is advised to place Silvadene on these areas. - oxyCODONE (OXY IR/ROXICODONE) 5 MG immediate release tablet; Take 1-2 tablets (5-10 mg total) by mouth every 4 (four) hours as needed for moderate pain.  Dispense: 45 tablet; Refill: 0 - silver sulfADIAZINE (SILVADENE) 1 % cream; Apply 1 application topically daily.  Dispense: 50 g; Refill: 0   Current Outpatient Medications on File Prior to Visit  Medication Sig Dispense Refill   acetaminophen (TYLENOL) 500 MG tablet Take 1,000 mg by mouth every 6 (six) hours as needed.     apixaban (ELIQUIS) 5 MG TABS tablet Take 1 tablet (5 mg total) by mouth 2 (two) times daily. (Patient taking  differently: Take 5 mg by mouth every 12 (twelve) hours.) 60 tablet 0   aspirin 81 MG EC tablet Take 81 mg by mouth daily.     atorvastatin (LIPITOR) 40 MG tablet Take 1 tablet (40  mg total) by mouth daily at 10 pm. 30 tablet 0   clopidogrel (PLAVIX) 75 MG tablet Take 1 tablet (75 mg total) by mouth daily. 30 tablet 0   dapagliflozin propanediol (FARXIGA) 10 MG TABS tablet Take 10 mg by mouth daily. TAKING FOR HIS HEART     folic acid (FOLVITE) 1 MG tablet Take 1 tablet by mouth daily.     furosemide (LASIX) 40 MG tablet Take 20 mg by mouth every morning.     gabapentin (NEURONTIN) 300 MG capsule TAKE ONE CAPSULE BY MOUTH AT BEDTIME FOR PAIN (Patient taking differently: Take 300 mg by mouth at bedtime.) 90 capsule 2   hydrALAZINE (APRESOLINE) 25 MG tablet Take 25 mg by mouth 3 (three) times daily.     isosorbide mononitrate (IMDUR) 30 MG 24 hr tablet Take 30 mg by mouth every morning.     metoprolol succinate (TOPROL-XL) 25 MG 24 hr tablet Take 25 mg by mouth every morning.     sacubitril-valsartan (ENTRESTO) 49-51 MG Take 1 tablet by mouth 2 (two) times daily. 180 tablet 3   spironolactone (ALDACTONE) 25 MG tablet Take 1 tablet (25 mg total) by mouth every morning. 90 tablet 3   thiamine 100 MG tablet Take 1 tablet (100 mg total) by mouth daily. (Patient taking differently: Take 125 mg by mouth daily.) 30 tablet 0   No current facility-administered medications on file prior to visit.    There are no Patient Instructions on file for this visit. No follow-ups on file.   Georgiana Spinner, NP

## 2021-08-19 ENCOUNTER — Telehealth (INDEPENDENT_AMBULATORY_CARE_PROVIDER_SITE_OTHER): Payer: Self-pay

## 2021-08-19 NOTE — Telephone Encounter (Signed)
Patient aunt left a voicemail stating the patient is having severe pain, She wanted to know if the patient could take tylenol or Advil for pain. I spoke with Dr Gilda Crease and he recommended that the patient can take Tylenol for pain. I reach to the patient and was not able to leave a message.

## 2021-08-19 NOTE — Telephone Encounter (Signed)
Patient wife return call and was made aware that it was fine for her husband to take Tylenol for pain.

## 2021-08-20 ENCOUNTER — Other Ambulatory Visit: Payer: Self-pay | Admitting: Family

## 2021-08-20 ENCOUNTER — Telehealth: Payer: Self-pay | Admitting: Family

## 2021-08-20 ENCOUNTER — Other Ambulatory Visit: Payer: Self-pay | Admitting: Surgery

## 2021-08-20 NOTE — Telephone Encounter (Signed)
Called and LVM with patient that his prior Berkley Harvey has been approved and the copy from insurance has been faxed over to [employer] in hopes to speed up the approval process.   Shunta Mclaurin, NT

## 2021-08-21 ENCOUNTER — Other Ambulatory Visit (INDEPENDENT_AMBULATORY_CARE_PROVIDER_SITE_OTHER): Payer: Self-pay | Admitting: Nurse Practitioner

## 2021-08-21 ENCOUNTER — Other Ambulatory Visit: Payer: Self-pay | Admitting: Family

## 2021-08-21 DIAGNOSIS — I70221 Atherosclerosis of native arteries of extremities with rest pain, right leg: Secondary | ICD-10-CM

## 2021-08-21 DIAGNOSIS — Z95820 Peripheral vascular angioplasty status with implants and grafts: Secondary | ICD-10-CM

## 2021-08-21 NOTE — Progress Notes (Signed)
Patient ID: Shawn Taylor, adult   DOB: 07/06/1956, 65 y.o.   MRN: [id number]  No chief complaint on file.   HPI Shawn Taylor is a 65 y.o. adult.    Procedure 07/24/2021: Right common femoral, superficial femoral and profunda femoris endarterectomy with Cormatrix patch angioplasty. Right distal profunda femoris endarterectomy with a second CorMatrix patch angioplasty. Angioplasty and stent placement right superficial femoral and popliteal arteries with Viabahn stents to a maximum was 6 mm in diameter.  He states his right foot pain is gone he is still having some swelling and some painful numbness of the medial thigh.  ABI Rt=0.91 and Lt=0.79   Past Medical History:  Diagnosis Date   Atrial flutter (HCC)    CAD (coronary artery disease)    Cardiac arrest (HCC) 02/12/2020   WITH HIS MI   Carotid artery occlusion    Cervical myelopathy (HCC)    CHF (congestive heart failure) (HCC)    Chronic anticoagulation    Apixaban + ASA   DOE (dyspnea on exertion)    DVT (deep venous thrombosis) (HCC)    HCV (hepatitis C virus) 2019   Tx'd with Harvoni course   HLD (hyperlipidemia)    Hypertension    Migraines    Myocardial infarction (HCC) 02/12/2020   PAD (peripheral artery disease) (HCC)    Polysubstance abuse (HCC)    Cocaine and ETOH   Thrombocytopenia (HCC)     Past Surgical History:  Procedure Laterality Date   ANTERIOR CERVICAL CORPECTOMY N/A 04/17/2021   Procedure: ANTERIOR CERVICAL CORPECTOMY C4;  Surgeon: Venetia Night, MD;  Location: [facility];  Service: Neurosurgery;  Laterality: N/A;  2nd case   ANTERIOR CERVICAL DECOMP/DISCECTOMY FUSION N/A 04/17/2021   Procedure: C5-6 ANTERIOR CERVICAL DECOMPRESSION/DISCECTOMY FUSION;  Surgeon: Venetia Night, MD;  Location: [facility];  Service: Neurosurgery;  Laterality: N/A;   ENDARTERECTOMY FEMORAL Right 07/24/2021   Procedure: ENDARTERECTOMY FEMORAL WITH STENT PLACEMENT;  Surgeon: Renford Dills, MD;  Location:  [facility];  Service: Vascular;  Laterality: Right;  Femoral endarterectomy with stent   LOWER EXTREMITY ANGIOGRAPHY Right 01/29/2021   Procedure: LOWER EXTREMITY ANGIOGRAPHY;  Surgeon: Renford Dills, MD;  Location: [facility];  Service: Cardiovascular;  Laterality: Right;      No Known Allergies  Current Outpatient Medications  Medication Sig Dispense Refill   acetaminophen (TYLENOL) 500 MG tablet Take 1,000 mg by mouth every 6 (six) hours as needed.     apixaban (ELIQUIS) 5 MG TABS tablet Take 1 tablet (5 mg total) by mouth 2 (two) times daily. (Patient taking differently: Take 5 mg by mouth every 12 (twelve) hours.) 60 tablet 0   aspirin 81 MG EC tablet Take 81 mg by mouth daily.     atorvastatin (LIPITOR) 40 MG tablet Take 1 tablet (40 mg total) by mouth daily at 10 pm. 30 tablet 0   clopidogrel (PLAVIX) 75 MG tablet Take 1 tablet by mouth once daily 30 tablet 0   dapagliflozin propanediol (FARXIGA) 10 MG TABS tablet Take 10 mg by mouth daily. TAKING FOR HIS HEART     folic acid (FOLVITE) 1 MG tablet Take 1 tablet by mouth daily.     furosemide (LASIX) 40 MG tablet Take 20 mg by mouth every morning.     gabapentin (NEURONTIN) 300 MG capsule TAKE ONE CAPSULE BY MOUTH AT BEDTIME FOR PAIN (Patient taking differently: Take 300 mg by mouth at bedtime.) 90 capsule 2   hydrALAZINE (APRESOLINE) 25 MG  tablet Take 25 mg by mouth 3 (three) times daily.     isosorbide mononitrate (IMDUR) 30 MG 24 hr tablet Take 30 mg by mouth every morning.     metoprolol succinate (TOPROL-XL) 25 MG 24 hr tablet Take 25 mg by mouth every morning.     oxyCODONE (OXY IR/ROXICODONE) 5 MG immediate release tablet Take 1-2 tablets (5-10 mg total) by mouth every 4 (four) hours as needed for moderate pain. 45 tablet 0   sacubitril-valsartan (ENTRESTO) 49-51 MG Take 1 tablet by mouth 2 (two) times daily. 180 tablet 3   silver sulfADIAZINE (SILVADENE) 1 % cream Apply 1 application topically daily. 50 g 0    spironolactone (ALDACTONE) 25 MG tablet Take 1 tablet (25 mg total) by mouth every morning. 90 tablet 3   thiamine 100 MG tablet Take 1 tablet (100 mg total) by mouth daily. (Patient taking differently: Take 125 mg by mouth daily.) 30 tablet 0   No current facility-administered medications for this visit.        Physical Exam There were no vitals taken for this visit. Gen:  WD/WN, NAD Skin: incision C/D/I; right foot warm with brisk cap refill     Assessment/Plan: 1. 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  The patient reports that the claudication symptoms and leg pain is essentially gone.   The patient denies lifestyle limiting changes at this point in time.  No further invasive studies, angiography or surgery at this time The patient should continue walking and begin a more formal exercise program.  The patient should continue antiplatelet therapy and aggressive treatment of the lipid abnormalities  Smoking cessation was again discussed  The patient should continue wearing graduated compression socks 10-15 mmHg strength to control the mild edema.  Patient should undergo noninvasive studies as ordered. The patient will follow up with me after the studies.   - VAS [place] LOWER EXTREMITY ARTERIAL DUPLEX; Future - VAS [place] ABI WITH/WO TBI; Future      Levora Dredge 08/21/2021, 6:02 PM   This note was created with Dragon medical transcription system.  Any errors from dictation are unintentional.

## 2021-08-22 ENCOUNTER — Other Ambulatory Visit: Payer: Self-pay

## 2021-08-22 ENCOUNTER — Ambulatory Visit (INDEPENDENT_AMBULATORY_CARE_PROVIDER_SITE_OTHER): Payer: Medicaid Other | Admitting: Vascular Surgery

## 2021-08-22 ENCOUNTER — Encounter (INDEPENDENT_AMBULATORY_CARE_PROVIDER_SITE_OTHER): Payer: Self-pay | Admitting: Vascular Surgery

## 2021-08-22 ENCOUNTER — Ambulatory Visit (INDEPENDENT_AMBULATORY_CARE_PROVIDER_SITE_OTHER): Payer: Medicaid Other

## 2021-08-22 VITALS — BP 107/67 | HR 92 | Resp 16 | Wt 189.4 lb

## 2021-08-22 DIAGNOSIS — I70221 Atherosclerosis of native arteries of extremities with rest pain, right leg: Secondary | ICD-10-CM

## 2021-08-22 DIAGNOSIS — Z95820 Peripheral vascular angioplasty status with implants and grafts: Secondary | ICD-10-CM

## 2021-08-22 DIAGNOSIS — I70229 Atherosclerosis of native arteries of extremities with rest pain, unspecified extremity: Secondary | ICD-10-CM

## 2021-08-24 ENCOUNTER — Encounter (INDEPENDENT_AMBULATORY_CARE_PROVIDER_SITE_OTHER): Payer: Self-pay | Admitting: Vascular Surgery

## 2021-08-27 ENCOUNTER — Telehealth: Payer: Self-pay

## 2021-08-27 NOTE — Telephone Encounter (Signed)
Left voicemail with Debby Freiberg, patient's aunt. She handles all of the patient's medications and is primary contact.  Informed Aunt that the [employer] Patient Assistance program for medication had been denied on the basis of prescription drug coverage available through medicare now that medicare is approved. Informed to call us with any further questions.  Suanne Marker, RN [facility]

## 2021-09-01 ENCOUNTER — Other Ambulatory Visit: Payer: Self-pay

## 2021-09-01 ENCOUNTER — Encounter
Admission: EM | Disposition: A | Discharge status: Home / Self Care | Payer: Self-pay | Source: Home / Self Care | Attending: Emergency Medicine

## 2021-09-01 ENCOUNTER — Ambulatory Visit
Admission: EM | Admit: 2021-09-01 | Discharge: 2021-09-01 | Disposition: A | Discharge status: Home / Self Care | Payer: Medicare Other | Attending: Emergency Medicine | Admitting: Emergency Medicine

## 2021-09-01 ENCOUNTER — Emergency Department: Payer: Medicare Other | Admitting: Anesthesiology

## 2021-09-01 DIAGNOSIS — I739 Peripheral vascular disease, unspecified: Secondary | ICD-10-CM | POA: Diagnosis not present

## 2021-09-01 DIAGNOSIS — F17210 Nicotine dependence, cigarettes, uncomplicated: Secondary | ICD-10-CM | POA: Diagnosis not present

## 2021-09-01 DIAGNOSIS — I4892 Unspecified atrial flutter: Secondary | ICD-10-CM | POA: Insufficient documentation

## 2021-09-01 DIAGNOSIS — E785 Hyperlipidemia, unspecified: Secondary | ICD-10-CM | POA: Diagnosis not present

## 2021-09-01 DIAGNOSIS — I110 Hypertensive heart disease with heart failure: Secondary | ICD-10-CM | POA: Diagnosis not present

## 2021-09-01 DIAGNOSIS — I509 Heart failure, unspecified: Secondary | ICD-10-CM | POA: Insufficient documentation

## 2021-09-01 DIAGNOSIS — I2510 Atherosclerotic heart disease of native coronary artery without angina pectoris: Secondary | ICD-10-CM | POA: Diagnosis not present

## 2021-09-01 DIAGNOSIS — Z7901 Long term (current) use of anticoagulants: Secondary | ICD-10-CM | POA: Diagnosis not present

## 2021-09-01 DIAGNOSIS — I252 Old myocardial infarction: Secondary | ICD-10-CM | POA: Diagnosis not present

## 2021-09-01 DIAGNOSIS — Z8674 Personal history of sudden cardiac arrest: Secondary | ICD-10-CM | POA: Insufficient documentation

## 2021-09-01 DIAGNOSIS — Z86718 Personal history of other venous thrombosis and embolism: Secondary | ICD-10-CM | POA: Diagnosis not present

## 2021-09-01 DIAGNOSIS — D62 Acute posthemorrhagic anemia: Secondary | ICD-10-CM | POA: Diagnosis present

## 2021-09-01 DIAGNOSIS — R040 Epistaxis: Secondary | ICD-10-CM | POA: Insufficient documentation

## 2021-09-01 HISTORY — PX: NASAL ENDOSCOPY WITH EPISTAXIS CONTROL: SHX5664

## 2021-09-01 LAB — BASIC METABOLIC PANEL
Anion gap: 12 (ref 5–15)
BUN: 45 mg/dL — ABNORMAL HIGH (ref 8–23)
CO2: 19 mmol/L — ABNORMAL LOW (ref 22–32)
Calcium: 8.8 mg/dL — ABNORMAL LOW (ref 8.9–10.3)
Chloride: 103 mmol/L (ref 98–111)
Creatinine, Ser: 1.42 mg/dL — ABNORMAL HIGH (ref 0.61–1.24)
GFR, Estimated: 55 mL/min — ABNORMAL LOW (ref >60)
Glucose, Bld: 152 mg/dL — ABNORMAL HIGH (ref 70–99)
Potassium: 4.6 mmol/L (ref 3.5–5.1)
Sodium: 134 mmol/L — ABNORMAL LOW (ref 135–145)

## 2021-09-01 LAB — CBC WITH DIFFERENTIAL/PLATELET
Abs Immature Granulocytes: 0.28 10*3/uL — ABNORMAL HIGH (ref 0.00–0.07)
Abs Immature Granulocytes: 0.35 10*3/uL — ABNORMAL HIGH (ref 0.00–0.07)
Basophils Absolute: 0.1 10*3/uL (ref 0.0–0.1)
Basophils Absolute: 0.1 10*3/uL (ref 0.0–0.1)
Basophils Relative: 0 %
Basophils Relative: 0 %
Eosinophils Absolute: 0.2 10*3/uL (ref 0.0–0.5)
Eosinophils Absolute: 0.1 10*3/uL (ref 0.0–0.5)
Eosinophils Relative: 1 %
Eosinophils Relative: 1 %
HCT: 32.2 % — ABNORMAL LOW (ref 39.0–52.0)
HCT: 30.8 % — ABNORMAL LOW (ref 39.0–52.0)
Hemoglobin: 10.6 g/dL — ABNORMAL LOW (ref 13.0–17.0)
Hemoglobin: 10.1 g/dL — ABNORMAL LOW (ref 13.0–17.0)
Immature Granulocytes: 2 %
Immature Granulocytes: 2 %
Lymphocytes Relative: 13 %
Lymphocytes Relative: 13 %
Lymphs Abs: 2.1 10*3/uL (ref 0.7–4.0)
Lymphs Abs: 2 10*3/uL (ref 0.7–4.0)
MCH: 28.5 pg (ref 26.0–34.0)
MCH: 29 pg (ref 26.0–34.0)
MCHC: 32.9 g/dL (ref 30.0–36.0)
MCHC: 32.8 g/dL (ref 30.0–36.0)
MCV: 86.6 fL (ref 80.0–100.0)
MCV: 88.5 fL (ref 80.0–100.0)
Monocytes Absolute: 1.2 10*3/uL — ABNORMAL HIGH (ref 0.1–1.0)
Monocytes Absolute: 1.1 10*3/uL — ABNORMAL HIGH (ref 0.1–1.0)
Monocytes Relative: 7 %
Monocytes Relative: 7 %
Neutro Abs: 12.6 10*3/uL — ABNORMAL HIGH (ref 1.7–7.7)
Neutro Abs: 12.5 10*3/uL — ABNORMAL HIGH (ref 1.7–7.7)
Neutrophils Relative %: 77 %
Neutrophils Relative %: 77 %
Platelets: 424 10*3/uL — ABNORMAL HIGH (ref 150–400)
Platelets: 391 10*3/uL (ref 150–400)
RBC: 3.72 MIL/uL — ABNORMAL LOW (ref 4.22–5.81)
RBC: 3.48 MIL/uL — ABNORMAL LOW (ref 4.22–5.81)
RDW: 15.8 % — ABNORMAL HIGH (ref 11.5–15.5)
RDW: 15.6 % — ABNORMAL HIGH (ref 11.5–15.5)
WBC: 16.4 10*3/uL — ABNORMAL HIGH (ref 4.0–10.5)
WBC: 16.2 10*3/uL — ABNORMAL HIGH (ref 4.0–10.5)
nRBC: 0 % (ref 0.0–0.2)
nRBC: 0 % (ref 0.0–0.2)

## 2021-09-01 LAB — TYPE AND SCREEN
ABO/RH(D): B POS
Antibody Screen: NEGATIVE

## 2021-09-01 LAB — APTT: aPTT: 30 seconds (ref 24–36)

## 2021-09-01 LAB — PROTIME-INR
INR: 1.2 (ref 0.8–1.2)
Prothrombin Time: 15.5 seconds — ABNORMAL HIGH (ref 11.4–15.2)

## 2021-09-01 SURGERY — CONTROL OF EPISTAXIS, ENDOSCOPIC
Anesthesia: General

## 2021-09-01 MED ORDER — PHENYLEPHRINE HCL (PRESSORS) 10 MG/ML IV SOLN
INTRAVENOUS | Status: DC | PRN
  Administered 2021-09-01 (×5): 200 ug via INTRAVENOUS

## 2021-09-01 MED ORDER — ONDANSETRON HCL 4 MG/2ML IJ SOLN
4.0000 mg | Freq: Once | INTRAMUSCULAR | Status: AC
  Administered 2021-09-01: 4 mg via INTRAVENOUS
  Filled 2021-09-01: qty 2

## 2021-09-01 MED ORDER — OXYMETAZOLINE HCL 0.05 % NA SOLN
NASAL | Status: AC
  Filled 2021-09-01: qty 30

## 2021-09-01 MED ORDER — DEXAMETHASONE SODIUM PHOSPHATE 10 MG/ML IJ SOLN
INTRAMUSCULAR | Status: DC | PRN
  Administered 2021-09-01: 10 mg via INTRAVENOUS

## 2021-09-01 MED ORDER — ONDANSETRON HCL 4 MG/2ML IJ SOLN
INTRAMUSCULAR | Status: DC | PRN
  Administered 2021-09-01: 4 mg via INTRAVENOUS

## 2021-09-01 MED ORDER — SODIUM CHLORIDE 0.9 % IV BOLUS
500.0000 mL | Freq: Once | INTRAVENOUS | Status: AC
  Administered 2021-09-01: 500 mL via INTRAVENOUS

## 2021-09-01 MED ORDER — MIDAZOLAM HCL 2 MG/2ML IJ SOLN
INTRAMUSCULAR | Status: AC
  Filled 2021-09-01: qty 2

## 2021-09-01 MED ORDER — TRANEXAMIC ACID FOR EPISTAXIS
500.0000 mg | Freq: Once | TOPICAL | Status: AC
  Administered 2021-09-01: 500 mg via TOPICAL
  Filled 2021-09-01: qty 10

## 2021-09-01 MED ORDER — 0.9 % SODIUM CHLORIDE (POUR BTL) OPTIME
TOPICAL | Status: DC | PRN
  Administered 2021-09-01: 500 mL

## 2021-09-01 MED ORDER — LIDOCAINE HCL (CARDIAC) PF 100 MG/5ML IV SOSY
PREFILLED_SYRINGE | INTRAVENOUS | Status: DC | PRN
  Administered 2021-09-01: 100 mg via INTRAVENOUS

## 2021-09-01 MED ORDER — SILVER NITRATE-POT NITRATE 75-25 % EX MISC
1.0000 | Freq: Once | CUTANEOUS | Status: AC
  Administered 2021-09-01: 1 via TOPICAL
  Filled 2021-09-01: qty 10

## 2021-09-01 MED ORDER — MIDAZOLAM HCL 2 MG/2ML IJ SOLN
INTRAMUSCULAR | Status: DC | PRN
  Administered 2021-09-01: 1 mg via INTRAVENOUS

## 2021-09-01 MED ORDER — PROPOFOL 10 MG/ML IV BOLUS
INTRAVENOUS | Status: AC
  Filled 2021-09-01: qty 20

## 2021-09-01 MED ORDER — LACTATED RINGERS IV SOLN: INTRAVENOUS | Status: DC | PRN

## 2021-09-01 MED ORDER — FENTANYL CITRATE (PF) 100 MCG/2ML IJ SOLN: 25.0000 ug | INTRAMUSCULAR | Status: DC | PRN

## 2021-09-01 MED ORDER — AMOXICILLIN-POT CLAVULANATE 875-125 MG PO TABS
1.0000 | ORAL_TABLET | Freq: Once | ORAL | Status: DC
  Filled 2021-09-01: qty 1

## 2021-09-01 MED ORDER — PROPOFOL 10 MG/ML IV BOLUS
INTRAVENOUS | Status: DC | PRN
  Administered 2021-09-01: 160 mg via INTRAVENOUS

## 2021-09-01 MED ORDER — LIDOCAINE VISCOUS HCL 2 % MT SOLN
15.0000 mL | Freq: Once | OROMUCOSAL | Status: AC
  Administered 2021-09-01: 15 mL via OROMUCOSAL
  Filled 2021-09-01: qty 15

## 2021-09-01 MED ORDER — PROMETHAZINE HCL 25 MG/ML IJ SOLN: 6.2500 mg | INTRAMUSCULAR | Status: DC | PRN

## 2021-09-01 MED ORDER — METOPROLOL SUCCINATE ER 50 MG PO TB24
25.0000 mg | ORAL_TABLET | Freq: Every day | ORAL | Status: DC
  Administered 2021-09-01: 25 mg via ORAL
  Filled 2021-09-01: qty 1

## 2021-09-01 MED ORDER — FENTANYL CITRATE (PF) 100 MCG/2ML IJ SOLN
INTRAMUSCULAR | Status: AC
  Filled 2021-09-01: qty 2

## 2021-09-01 MED ORDER — SUCCINYLCHOLINE CHLORIDE 200 MG/10ML IV SOSY
PREFILLED_SYRINGE | INTRAVENOUS | Status: DC | PRN
  Administered 2021-09-01: 160 mg via INTRAVENOUS

## 2021-09-01 MED ORDER — OXYMETAZOLINE HCL 0.05 % NA SOLN
NASAL | Status: DC | PRN
  Administered 2021-09-01: 1

## 2021-09-01 MED ORDER — FENTANYL CITRATE (PF) 100 MCG/2ML IJ SOLN
INTRAMUSCULAR | Status: DC | PRN
  Administered 2021-09-01 (×2): 50 ug via INTRAVENOUS

## 2021-09-01 MED ORDER — SALINE SPRAY 0.65 % NA SOLN: 1.0000 | Freq: Four times a day (QID) | NASAL | 0 refills | Status: DC

## 2021-09-01 MED ORDER — AMOXICILLIN-POT CLAVULANATE 875-125 MG PO TABS: 1.0000 | ORAL_TABLET | Freq: Two times a day (BID) | ORAL | 0 refills | Status: AC

## 2021-09-01 MED ORDER — PHENYLEPHRINE HCL 0.5 % NA SOLN
1.0000 [drp] | Freq: Once | NASAL | Status: AC
  Administered 2021-09-01: 1 [drp] via NASAL
  Filled 2021-09-01: qty 15

## 2021-09-01 SURGICAL SUPPLY — 21 items
COAG SUCT 10F 3.5MM HAND CTRL (MISCELLANEOUS) ×4 IMPLANT
DEFOGGER ANTIFOG KIT (MISCELLANEOUS) ×2 IMPLANT
DRESSING NASL FOAM PST OP SINU (MISCELLANEOUS) ×2 IMPLANT
DRSG NASAL FOAM POST OP SINU (MISCELLANEOUS) ×4
ELECT REM PT RETURN 9FT ADLT (ELECTROSURGICAL) ×2
ELECTRODE REM PT RTRN 9FT ADLT (ELECTROSURGICAL) ×1 IMPLANT
GAUZE 4X4 16PLY ~~LOC~~+RFID DBL (SPONGE) ×2 IMPLANT
GAUZE SPONGE 4X4 12PLY STRL (GAUZE/BANDAGES/DRESSINGS) ×2 IMPLANT
GLOVE SURG ENC MOIS LTX SZ7.5 (GLOVE) ×4 IMPLANT
GOWN SURG REUSE MED LVL4 (GOWN DISPOSABLE) ×2 IMPLANT
MANIFOLD NEPTUNE II (INSTRUMENTS) ×2 IMPLANT
NS IRRIG 500ML POUR BTL (IV SOLUTION) ×2 IMPLANT
PACK HEAD/NECK (MISCELLANEOUS) ×2 IMPLANT
PACKING NASAL EPIS 4X2.4 XEROG (MISCELLANEOUS) ×2 IMPLANT
PATTIES SURGICAL .5 X3 (DISPOSABLE) ×2 IMPLANT
SPONGE NEURO XRAY DETECT 1X3 (DISPOSABLE) ×2 IMPLANT
SYR 30ML LL (SYRINGE) ×2 IMPLANT
TAMPON NAS POPE NO STRING (MISCELLANEOUS) ×4 IMPLANT
TUBING CONNECTING 10 (TUBING) ×2 IMPLANT
WATER STERILE IRR 500ML POUR (IV SOLUTION) ×2 IMPLANT
tonsil sponge ×2 IMPLANT

## 2021-09-01 NOTE — ED Notes (Signed)
Vaught, ENT MD at bedside

## 2021-09-01 NOTE — ED Provider Notes (Signed)
[facility] Emergency Department Provider Note  ____________________________________________  Time seen: Approximately 6:23 AM  I have reviewed the triage vital signs and the nursing notes.   HISTORY  Chief Complaint Epistaxis   HPI Shawn Taylor is a 65 y.o. adult with a history of heart failure with a EF of 20%, atrial flutter on Eliquis, PAD, cocaine alcohol abuse, thrombocytopenia who presents for evaluation of epistaxis.  Patient reports left nare epistaxis that has been ongoing intermittently for the last 6 hours.  He reports that he has gone over an entire roll of paper towels and toilet paper throughout the night.  EMS gave 2 rounds of Afrin with a nose plug.  Patient arrives with minimal bleeding from the left nare.  He denies any dizziness or chest pain.   Past Medical History:  Diagnosis Date   Atrial flutter (HCC)    CAD (coronary artery disease)    Cardiac arrest (HCC) 02/12/2020   WITH HIS MI   Carotid artery occlusion    Cervical myelopathy (HCC)    CHF (congestive heart failure) (HCC)    Chronic anticoagulation    Apixaban + ASA   DOE (dyspnea on exertion)    DVT (deep venous thrombosis) (HCC)    HCV (hepatitis C virus) 2019   Tx'd with Harvoni course   HLD (hyperlipidemia)    Hypertension    Migraines    Myocardial infarction (HCC) 02/12/2020   PAD (peripheral artery disease) (HCC)    Polysubstance abuse (HCC)    Cocaine and ETOH   Thrombocytopenia (HCC)     Patient Active Problem List   Diagnosis Date Noted   Atherosclerosis of native arteries of the extremities with ulceration (HCC) 07/24/2021   Cervical myelopathy (HCC) 04/17/2021   Polysubstance abuse (HCC)    Chronic systolic CHF (congestive heart failure) (HCC)    Atherosclerosis of native arteries of extremity with rest pain (HCC) 02/15/2021   Atherosclerosis of native arteries of extremity with intermittent claudication (HCC) 12/03/2020   Hyperlipidemia 12/03/2020    Radicular pain in left arm 12/03/2020   Bilateral carotid artery disease (HCC) 09/24/2020   Hypertension 03/28/2020   Acute systolic CHF (congestive heart failure) (HCC) 02/26/2020   DVT of lower extremity, bilateral (HCC) 02/26/2020   Dysphagia 02/26/2020   Thrombocytopenia (HCC) 02/26/2020   Pulmonary edema 02/26/2020   Cocaine abuse (HCC) 02/26/2020   Alcohol abuse 02/26/2020   AKI (acute kidney injury) (HCC) 02/26/2020   Hyponatremia 02/26/2020   Cardiac arrest, cause unspecified (HCC) 02/16/2020   Liver failure (HCC) 02/16/2020   Acute hypoxemic respiratory failure (HCC) 02/16/2020   Atrial flutter (HCC) 02/13/2020    Past Surgical History:  Procedure Laterality Date   ANTERIOR CERVICAL CORPECTOMY N/A 04/17/2021   Procedure: ANTERIOR CERVICAL CORPECTOMY C4;  Surgeon: Venetia Night, MD;  Location: [facility] ORS;  Service: Neurosurgery;  Laterality: N/A;  2nd case   ANTERIOR CERVICAL DECOMP/DISCECTOMY FUSION N/A 04/17/2021   Procedure: C5-6 ANTERIOR CERVICAL DECOMPRESSION/DISCECTOMY FUSION;  Surgeon: Venetia Night, MD;  Location: [facility] ORS;  Service: Neurosurgery;  Laterality: N/A;   ENDARTERECTOMY FEMORAL Right 07/24/2021   Procedure: ENDARTERECTOMY FEMORAL WITH STENT PLACEMENT;  Surgeon: Renford Dills, MD;  Location: [facility] ORS;  Service: Vascular;  Laterality: Right;  Femoral endarterectomy with stent   LOWER EXTREMITY ANGIOGRAPHY Right 01/29/2021   Procedure: LOWER EXTREMITY ANGIOGRAPHY;  Surgeon: Renford Dills, MD;  Location: [facility] INVASIVE CV LAB;  Service: Cardiovascular;  Laterality: Right;    Prior to Admission medications  Medication Sig Start Date End Date Taking? Authorizing Provider  acetaminophen (TYLENOL) 500 MG tablet Take 1,000 mg by mouth every 6 (six) hours as needed.    [provider]  apixaban (ELIQUIS) 5 MG TABS tablet Take 1 tablet (5 mg total) by mouth 2 (two) times daily. Patient taking differently: Take 5 mg by mouth every 12 (twelve)  hours. 03/07/20   Lurene Shadow, MD  aspirin 81 MG EC tablet Take 81 mg by mouth daily.    [provider]  atorvastatin (LIPITOR) 40 MG tablet Take 1 tablet (40 mg total) by mouth daily at 10 pm. 03/07/20   Lurene Shadow, MD  clopidogrel (PLAVIX) 75 MG tablet Take 1 tablet by mouth once daily 08/20/21   Nada Libman, MD  dapagliflozin propanediol (FARXIGA) 10 MG TABS tablet Take 10 mg by mouth daily. TAKING FOR HIS HEART    [provider]  folic acid (FOLVITE) 1 MG tablet Take 1 tablet by mouth daily. 07/11/21   [provider]  furosemide (LASIX) 40 MG tablet Take 20 mg by mouth every morning.    [provider]  gabapentin (NEURONTIN) 300 MG capsule TAKE ONE CAPSULE BY MOUTH AT BEDTIME FOR PAIN Patient taking differently: Take 300 mg by mouth at bedtime. 04/16/20   Revelo, Presley Raddle, MD  hydrALAZINE (APRESOLINE) 25 MG tablet Take 25 mg by mouth 3 (three) times daily.    [provider]  isosorbide mononitrate (IMDUR) 30 MG 24 hr tablet Take 30 mg by mouth every morning.    [provider]  metoprolol succinate (TOPROL-XL) 25 MG 24 hr tablet Take 25 mg by mouth every morning.    Rockey Situ, NP  oxyCODONE (OXY IR/ROXICODONE) 5 MG immediate release tablet Take 1-2 tablets (5-10 mg total) by mouth every 4 (four) hours as needed for moderate pain. 08/08/21   Georgiana Spinner, NP  sacubitril-valsartan (ENTRESTO) 49-51 MG Take 1 tablet by mouth 2 (two) times daily. 09/04/20   Delma Freeze, FNP  silver sulfADIAZINE (SILVADENE) 1 % cream Apply 1 application topically daily. 08/08/21   Georgiana Spinner, NP  spironolactone (ALDACTONE) 25 MG tablet Take 1 tablet (25 mg total) by mouth every morning. 06/25/21   Delma Freeze, FNP  thiamine 100 MG tablet Take 1 tablet (100 mg total) by mouth daily. Patient taking differently: Take 125 mg by mouth daily. 03/08/20   Lurene Shadow, MD    Allergies Patient has no known allergies.  Family  History  Problem Relation Age of Onset   Diabetes Sister     Social History Social History   Tobacco Use   Smoking status: Every Day    Packs/day: 0.25    Years: 55.00    Pack years: 13.75    Types: Cigarettes   Smokeless tobacco: Never  Vaping Use   Vaping Use: Never used  Substance Use Topics   Alcohol use: Not Currently    Alcohol/week: 2.0 - 3.0 standard drinks    Types: 2 - 3 Cans of beer per week   Drug use: Not Currently    Frequency: 1.0 times per week    Types: Cocaine    Comment: last documented + cocaine UDS was 10/2016    Review of Systems  Constitutional: Negative for fever. Eyes: Negative for visual changes. ENT: Negative for sore throat. + epistaxis Neck: No neck pain  Cardiovascular: Negative for chest pain. Respiratory: Negative for shortness of breath. Gastrointestinal: Negative for abdominal pain, vomiting or  diarrhea. Genitourinary: Negative for dysuria. Musculoskeletal: Negative for back pain. Skin: Negative for rash. Neurological: Negative for headaches, weakness or numbness. Psych: No SI or HI  ____________________________________________   PHYSICAL EXAM:  VITAL SIGNS: ED Triage Vitals  Enc Vitals Group     BP 09/01/21 0616 97/71     Pulse Rate 09/01/21 0616 (!) 135     Resp 09/01/21 0616 (!) 26     Temp 09/01/21 0616 97.8 F (36.6 C)     Temp Source 09/01/21 0616 Oral     SpO2 09/01/21 0616 98 %     Weight 09/01/21 0618 181 lb (82.1 kg)     Height 09/01/21 0618 5\' 8"  (1.727 m)     Head Circumference --      Peak Flow --      Pain Score 09/01/21 0617 8     Pain Loc --      Pain Edu? --      Excl. in GC? --     Constitutional: Alert and oriented. Well appearing and in no apparent distress. HEENT:      Head: Normocephalic and atraumatic.         Eyes: Conjunctivae are normal. Sclera is non-icteric.       Mouth/Throat: Mucous membranes are moist.       Nose: irritated mucosa of the L nare with no laceration or active  bleeding.      Neck: Supple with no signs of meningismus. Cardiovascular: Tachycardic with regular rhythm. Respiratory: Normal respiratory effort. Lungs are clear to auscultation bilaterally.  Gastrointestinal: Soft, non tender. Musculoskeletal:  No edema, cyanosis, or erythema of extremities. Neurologic: Normal speech and language. Face is symmetric. Moving all extremities. No gross focal neurologic deficits are appreciated. Skin: Skin is warm, dry and intact. No rash noted. Psychiatric: Mood and affect are normal. Speech and behavior are normal.  ____________________________________________   LABS (all labs ordered are listed, but only abnormal results are displayed)  Labs Reviewed  CBC WITH DIFFERENTIAL/PLATELET - Abnormal; Notable for the following components:      Result Value   WBC 16.4 (*)    RBC 3.72 (*)    Hemoglobin 10.6 (*)    HCT 32.2 (*)    RDW 15.8 (*)    Platelets 424 (*)    Neutro Abs 12.6 (*)    Monocytes Absolute 1.2 (*)    Abs Immature Granulocytes 0.28 (*)    All other components within normal limits  BASIC METABOLIC PANEL - Abnormal; Notable for the following components:   Sodium 134 (*)    CO2 19 (*)    Glucose, Bld 152 (*)    BUN 45 (*)    Creatinine, Ser 1.42 (*)    Calcium 8.8 (*)    GFR, Estimated 55 (*)    All other components within normal limits  PROTIME-INR - Abnormal; Notable for the following components:   Prothrombin Time 15.5 (*)    All other components within normal limits  APTT  TYPE AND SCREEN  TYPE AND SCREEN   ____________________________________________  EKG  ED ECG REPORT I, 11/01/21, the attending physician, personally viewed and interpreted this ECG.  Sinus tachycardia at the rate of 127, normal intervals, normal axis, no ST elevations or depressions. ____________________________________________  RADIOLOGY  none  ____________________________________________   PROCEDURES  Procedure(s) performed:yes .1-3  Lead EKG Interpretation Performed by: Nita Sickle, MD Authorized by: Nita Sickle, MD     Interpretation: abnormal     ECG rate assessment:  tachycardic     Rhythm: sinus tachycardia     Ectopy: none     Conduction: normal     Critical Care performed:  None ____________________________________________   INITIAL IMPRESSION / ASSESSMENT AND PLAN / ED COURSE  65 y.o. adult with a history of heart failure with a EF of 20%, atrial flutter on Eliquis, PAD on Plavix, cocaine alcohol abuse, thrombocytopenia who presents for evaluation of epistaxis that has been ongoing intermittently for the last 6 hours.  On arrival patient's bleeding is minimal, he is tachycardic with a heart rate in the 130s, EKG showing sinus tachycardia.  BP of 90s over 70s but review of epic shows that patient's blood pressure is usually this low.  There is irritated mucosa of the left nare but no obvious laceration or active bleeding.  Topical Afrin and TXA were applied.  He denies any symptoms of dizziness or chest pain.  We will get a type and screen and CBC and coags.  Will monitor closely.   _________________________ 7:23 AM on 09/01/2021 -----------------------------------------  Labs showing hemoglobin of 10.6.  Patient's last lab work up from a month ago had a hemoglobin of 13.  His heart rate has improved and is currently in the low 100s.  Plan to monitor patient for 3 hours and repeat hemoglobin to ensure the patient has not dropped to the point that he might need a blood transfusion and that the epistaxis has not recurred.  At that point, if hemoglobin is stable and vitals remained stable with no further bleeding will give his morning dose of metoprolol before discharge home for his tachycardia.  Care transferred to Dr. Marisa Severin     _____________________________________________ Please note:  Patient was evaluated in Emergency Department today for the symptoms described in the history of present  illness. Patient was evaluated in the context of the global COVID-19 pandemic, which necessitated consideration that the patient might be at risk for infection with the SARS-CoV-2 virus that causes COVID-19. Institutional protocols and algorithms that pertain to the evaluation of patients at risk for COVID-19 are in a state of rapid change based on information released by regulatory bodies including the CDC and federal and state organizations. These policies and algorithms were followed during the patient's care in the ED.  Some ED evaluations and interventions may be delayed as a result of limited staffing during the pandemic.   [place] Controlled Substance Database was reviewed by me. ____________________________________________   FINAL CLINICAL IMPRESSION(S) / ED DIAGNOSES   Final diagnoses:  Left-sided epistaxis  Acute blood loss anemia      NEW MEDICATIONS STARTED DURING THIS VISIT:  ED Discharge Orders     None        Note:  This document was prepared using Dragon voice recognition software and may include unintentional dictation errors.    Nita Sickle, MD 09/01/21 [phone]

## 2021-09-01 NOTE — Consult Note (Signed)
..Shawn Taylor, Shawn Taylor [id number] 1956/09/17 Dionne Bucy, MD  Reason for Consult: epistaxis  HPI: 65 y.o. male with history of recurrent epistaxis.  Patient reports has been bleeding since midnight.  Presented to ER this morning and was treated with TXA and cautery.  This improved things until he began to bleed with significant clots.  AP balloon packing and recauterization as performed by ER without improvement in bleeding.  He continues to bleed off and on and his HGB has dropped 3 points from baseline.  Patient is on Eliquis for CAD and he reports he had an MI last year and needed resuccitation. He denies any significant breathing issue.  He denies an illicit drug use.  Allergies: No Known Allergies  ROS: Review of systems normal other than 12 systems except per HPI.  PMH:  Past Medical History:  Diagnosis Date   Atrial flutter (HCC)    CAD (coronary artery disease)    Cardiac arrest (HCC) 02/12/2020   WITH HIS MI   Carotid artery occlusion    Cervical myelopathy (HCC)    CHF (congestive heart failure) (HCC)    Chronic anticoagulation    Apixaban + ASA   DOE (dyspnea on exertion)    DVT (deep venous thrombosis) (HCC)    HCV (hepatitis C virus) 2019   Tx'd with Harvoni course   HLD (hyperlipidemia)    Hypertension    Migraines    Myocardial infarction (HCC) 02/12/2020   PAD (peripheral artery disease) (HCC)    Polysubstance abuse (HCC)    Cocaine and ETOH   Thrombocytopenia (HCC)     FH:  Family History  Problem Relation Age of Onset   Diabetes Sister     SH:  Social History   Socioeconomic History   Marital status: Single    Spouse name: Not on file   Number of children: 6   Years of education: Not on file   Highest education level: Not on file  Occupational History   Not on file  Tobacco Use   Smoking status: Every Day    Packs/day: 0.25    Years: 55.00    Pack years: 13.75    Types: Cigarettes   Smokeless tobacco: Never  Vaping Use   Vaping Use:  Never used  Substance and Sexual Activity   Alcohol use: Not Currently    Alcohol/week: 2.0 - 3.0 standard drinks    Types: 2 - 3 Cans of beer per week   Drug use: Not Currently    Frequency: 1.0 times per week    Types: Cocaine    Comment: last documented + cocaine UDS was 10/2016   Sexual activity: Not on file  Other Topics Concern   Not on file  Social History Narrative   Not on file   Social Determinants of Health   Financial Resource Strain: Not on file  Food Insecurity: Not on file  Transportation Needs: Not on file  Physical Activity: Not on file  Stress: Not on file  Social Connections: Not on file  Intimate Partner Violence: Not on file    PSH:  Past Surgical History:  Procedure Laterality Date   ANTERIOR CERVICAL CORPECTOMY N/A 04/17/2021   Procedure: ANTERIOR CERVICAL CORPECTOMY C4;  Surgeon: Venetia Night, MD;  Location: [facility] ORS;  Service: Neurosurgery;  Laterality: N/A;  2nd case   ANTERIOR CERVICAL DECOMP/DISCECTOMY FUSION N/A 04/17/2021   Procedure: C5-6 ANTERIOR CERVICAL DECOMPRESSION/DISCECTOMY FUSION;  Surgeon: Venetia Night, MD;  Location: [facility] ORS;  Service: Neurosurgery;  Laterality: N/A;  ENDARTERECTOMY FEMORAL Right 07/24/2021   Procedure: ENDARTERECTOMY FEMORAL WITH STENT PLACEMENT;  Surgeon: Renford Dills, MD;  Location: [facility] ORS;  Service: Vascular;  Laterality: Right;  Femoral endarterectomy with stent   LOWER EXTREMITY ANGIOGRAPHY Right 01/29/2021   Procedure: LOWER EXTREMITY ANGIOGRAPHY;  Surgeon: Renford Dills, MD;  Location: [facility] INVASIVE CV LAB;  Service: Cardiovascular;  Laterality: Right;    Physical  Exam:  GEN-  NAD supine in bed with active left sided nose bleed NEURO- CN 2-12 grossly intact and symmetric. EARS-  eacs clear NOSE- active bleeding on left with blood flowing on mid septum OC/OP-  active bleeding down posterior oropharynx NECK- no LAD RESP- unlabored CARD-  tachycardia  Procedure:  Simple control of  nasal hemorrhage:  After verbal consent was obtained, a 10cm Merocel pack was placed into the patient's left nasal cavity.  This was inflated with Afrin.  Patient tolerated the procedure well.   A/P: Epistaxis s/p packing with 10cm merocel.  After packing the patient's nose bleeding persisted and was even more prominent than before.  Given that packing had occurred x3 and cauterization x2 and area of bleeding not visualized on anterior rhinoscopy, recommendation to proceed to OR for emergent endoscopic control of nasal hemorrhage.   Roney Mans Shawn Taylor 09/01/2021 1:07 PM

## 2021-09-01 NOTE — Anesthesia Procedure Notes (Signed)
Procedure Name: Intubation Date/Time: 09/01/2021 1:50 PM Performed by: Briant Cedar, MD Pre-anesthesia Checklist: Emergency Drugs available, Suction available, Patient being monitored and Timeout performed Patient Re-evaluated:Patient Re-evaluated prior to induction Oxygen Delivery Method: Circle system utilized Preoxygenation: Pre-oxygenation with 100% oxygen Induction Type: IV induction Laryngoscope Size: McGraph and 4 Grade View: Grade I Tube type: Oral Tube size: 7.5 mm Airway Equipment and Method: Stylet

## 2021-09-01 NOTE — Op Note (Signed)
..  09/01/2021  2:28 PM    Alm Bustard  [id number]   Pre-Op Dx:  Nasal Hemorrhage, Anticoagulation  Post-op Dx: Same  Proc:   1)  Endoscopic Control of Nasal Hemorrhage- Complex  Surg:  Linde Wilensky  Anes:  General  EBL:  60  Comp:  None  Findings: Left middle turbinate with bleeding from cleft in medial aspect.  No other sites of bleeding identified.  Procedure: After the patient was identified in holding and the benefits of the procedure were reviewed as well as the consent and risks.  The patient was taken to the operating room and with the patient in a comfortable supine position,  general orotracheal anesthesia was induced without difficulty.  A proper time-out was performed.  The patient next received preoperative Afrin spray for topical decongestion and vasoconstriction.  Several minutes were allowed for this to take effect.  The right side of the nasal cavity was next inspected with a zero degree endoscope and no bleeding site was identified.  At this time, attention was directed to his left nasal cavity.  The packing in place was removed with curved clamp.  Using a zero degree endoscope and size 2 suction, blood and clot was removed from the patient's nasal cavity.  This demonstrated a large cleft in his middle turbinate on the medial aspect with active bleeding noted.  The middle turbinate was lateralized Bovie suction cauter was extensively used to cauterize the bleeding area.  Mild Bleeding was also noted coming from the patient's septum.  This was cauterized as well but thought to be mostly trauma from packing as it was much less than medial aspect of the middle turbinate.  The sinus cavities were next copiously irrigated with sterile saline.  Meticulous hemostasis was noted and positive pressure from Anesthesia also revealed continued hemostasis with no evidence of any bleeding.  Stamberger sinufoam was placed medial to the middle turbinate.  Xerogel was placed  adjacent to the bleeding site as well and this was inflated with sterile saline.    Care of the patient at this time was transferred to Anestheisa.   Dispo:   PACU  Plan: Ice, elevation, narcotic analgesia and prophylactic antibiotics. We will reevaluate the patient in the office in 4 days.  Maryclaire Stoecker 09/01/2021 2:28 PM

## 2021-09-01 NOTE — Transfer of Care (Signed)
Immediate Anesthesia Transfer of Care Note  Patient: Shawn Taylor  Procedure(s) Performed: NASAL ENDOSCOPY WITH EPISTAXIS CONTROL  Patient Location: PACU  Anesthesia Type:General  Level of Consciousness: awake  Airway & Oxygen Therapy: Patient Spontanous Breathing  Post-op Assessment: Report given to RN  Post vital signs: stable  Last Vitals:  Vitals Value Taken Time  BP 110/65 09/01/21 1430  Temp 36.3 C 09/01/21 1427  Pulse 98 09/01/21 1430  Resp 18 09/01/21 1430  SpO2 96 % 09/01/21 1430  Vitals shown include unvalidated device data.  Last Pain:  Vitals:   09/01/21 1427  TempSrc:   PainSc: Asleep         Complications: No notable events documented.

## 2021-09-01 NOTE — Anesthesia Postprocedure Evaluation (Signed)
Anesthesia Post Note  Patient: Shawn Taylor  Procedure(s) Performed: NASAL ENDOSCOPY WITH EPISTAXIS CONTROL  Patient location during evaluation: PACU Anesthesia Type: General Level of consciousness: awake and alert Pain management: pain level controlled Vital Signs Assessment: post-procedure vital signs reviewed and stable Respiratory status: spontaneous breathing Cardiovascular status: blood pressure returned to baseline Anesthetic complications: no Comments: Pt doing well, no anesthetic issues;   No notable events documented.   Last Vitals:  Vitals:   09/01/21 1515 09/01/21 1530  BP: (!) 156/81 (!) 163/78  Pulse: 92 93  Resp: (!) 21 (!) 21  Temp:    SpO2: 100% 99%    Last Pain:  Vitals:   09/01/21 1530  TempSrc:   PainSc: 0-No pain                 Briant Cedar

## 2021-09-01 NOTE — Discharge Instructions (Addendum)
Do not blow your nose over the next several days.  Follow-up with your doctor within the next week to have your blood count rechecked. No Eliquis for 24 hours. Continue plavix.   Keep head elevated for 72 hours.  No bending over or straining.  No nose blowing.  Use nasal saline spray 3x per day for cleaning of nose.  Keep strict control of blood pressure. No bending over or lifting more than 5 pounds for 10 days.  You have some medicine inside your nose that will dissolve on it's own. Do not put anything up your nose.   Return to the ER for new, worsening, or or recurrent bleeding, weakness or lightheadedness, palpitations or chest pain, or any other new or worsening symptoms that concern you.   AMBULATORY SURGERY  DISCHARGE INSTRUCTIONS   The drugs that you were given will stay in your system until tomorrow so for the next 24 hours you should not:  Drive an automobile Make any legal decisions Drink any alcoholic beverage   You may resume regular meals tomorrow.  Today it is better to start with liquids and gradually work up to solid foods.  You may eat anything you prefer, but it is better to start with liquids, then soup and crackers, and gradually work up to solid foods.   Please notify your doctor immediately if you have any unusual bleeding, trouble breathing, redness and pain at the surgery site, drainage, fever, or pain not relieved by medication.    Additional Instructions:        Please contact your physician with any problems or Same Day Surgery at [phone], Monday through Friday 6 am to 4 pm, or [place] at [facility] number at [phone].

## 2021-09-01 NOTE — ED Provider Notes (Signed)
----------------------------------------- 10:26 AM on 09/01/2021 -----------------------------------------  I took over care of this patient from Dr. Don Perking.  The patient is still mildly tachycardic although just received his metoprolol a short time ago.  He had a small amount of recurring bleeding from the left nare.  On repeat examination I was able to identify a blood vessel anteriorly along the left side of the nasal septum with a small amount of bleeding.  I proceeded to successfully cauterize the area with silver nitrate.  We will obtain repeat CBC as planned and continue to observe the patient.  .Epistaxis Management  Date/Time: 09/01/2021 10:28 AM Performed by: Dionne Bucy, MD Authorized by: Dionne Bucy, MD   Consent:    Consent obtained:  Verbal   Consent given by:  Patient   Risks, benefits, and alternatives were discussed: yes     Risks discussed:  Bleeding, nasal injury and pain   Alternatives discussed:  Alternative treatment Universal protocol:    Patient identity confirmed:  Verbally with patient and arm band Procedure details:    Treatment site:  L anterior   Treatment method:  Silver nitrate   Treatment complexity:  Limited   Treatment episode: initial   Post-procedure details:    Assessment:  Bleeding stopped   Procedure completion:  Tolerated well, no immediate complications .Epistaxis Management  Date/Time: 09/01/2021 12:00 PM Performed by: Dionne Bucy, MD Authorized by: Dionne Bucy, MD   Consent:    Consent obtained:  Verbal   Consent given by:  Patient   Risks, benefits, and alternatives were discussed: yes     Risks discussed:  Bleeding, infection and pain   Alternatives discussed:  Alternative treatment Universal protocol:    Patient identity confirmed:  Verbally with patient and arm band Anesthesia:    Anesthesia method:  Topical application   Topical anesthetic:  Lidocaine gel Procedure details:    Treatment  site:  L anterior   Treatment method:  Anterior pack   Treatment complexity:  Extensive   Treatment episode: recurring   Post-procedure details:    Assessment:  No improvement   Procedure completion:  Tolerated well, no immediate complications  ----------------------------------------- 11:29 AM on 09/01/2021 -----------------------------------------  The patient has had no recurrent bleeding since the cautery.  Repeat hemoglobin is stable.  The patient still remains mildly tachycardic, with a rate between 100 and 110 when he is at rest.  He may be slightly intravascularly volume depleted although given his low EF I would like to avoid giving fluids at this time.  He has taken his metoprolol.  This is an extended release tablet so it may not take effect immediately.  At this time, the patient feels comfortable and wants to go home.  He is stable for discharge.  I counseled him on the results of the work-up.  I gave him very thorough return precautions and he expressed understanding  ----------------------------------------- 12:33 PM on 09/01/2021 -----------------------------------------  At the time of discharge, the patient started having bleeding again.  The patient coughed up a large blood clot that was on the floor.  On reexamination, there was evidence of fresh bleeding surrounding the area that had been cauterized.  I then placed a Rapid Rhino packing device.  The patient tolerated the procedure without immediate complication, however after several minutes he began coughing, there was bleeding around the packing and he started coughing up likely around 30 cc of clotted blood.  I removed the packing and have reapplied Afrin and pressure.  The bleeding seems  to be subsiding.  His vital signs remain stable.  I consulted Dr. Andee Poles from ENT who is en route to evaluate the patient.  ----------------------------------------- 1:37 PM on  09/01/2021 -----------------------------------------  The patient had to have a bowel movement and then became diaphoretic and appeared near syncopal.  He then became somewhat hypotensive so we gave a 500 mL bolus of fluid.  The patient started to appear more comfortable and his blood pressure stabilized.  Dr. Andee Poles evaluated the patient and packed the left nare with Merocel, however the patient started bleeding again around it.  He remains hemodynamically stable.  Dr. Andee Poles will take the patient to the OR.  ____________________________  CRITICAL CARE Performed by: Dionne Bucy   Total critical care time: 60 minutes  Critical care time was exclusive of separately billable procedures and treating other patients.  Critical care was necessary to treat or prevent imminent or life-threatening deterioration.  Critical care was time spent personally by me on the following activities: development of treatment plan with patient and/or surrogate as well as nursing, discussions with consultants, evaluation of patient's response to treatment, examination of patient, obtaining history from patient or surrogate, ordering and performing treatments and interventions, ordering and review of laboratory studies, ordering and review of radiographic studies, pulse oximetry and re-evaluation of patient's condition.      Dionne Bucy, MD 09/01/21 1339

## 2021-09-01 NOTE — ED Notes (Signed)
MD at bedside. Pt not tolerating nose packing.

## 2021-09-01 NOTE — Anesthesia Preprocedure Evaluation (Signed)
Anesthesia Evaluation  Patient identified by MRN, date of birth, ID band Patient awake    Reviewed: Allergy & Precautions, H&P , NPO status , Patient's Chart, lab work & pertinent test results, reviewed documented beta blocker date and time   Airway Mallampati: II  TM Distance: >3 FB Neck ROM: full    Dental  (+) Teeth Intact   Pulmonary neg pulmonary ROS, Current Smoker and Patient abstained from smoking.,    Pulmonary exam normal        Cardiovascular Exercise Tolerance: Poor hypertension, On Medications + CAD, + Past MI, + Peripheral Vascular Disease, +CHF and + DOE  Normal cardiovascular exam Rate:Normal     Neuro/Psych  Headaches, negative psych ROS   GI/Hepatic negative GI ROS, (+) Hepatitis -  Endo/Other  negative endocrine ROS  Renal/GU Renal disease  negative genitourinary   Musculoskeletal   Abdominal   Peds  Hematology Patient admitted with nasal bleed.  ENT attempted packing in the ED multiple times with no success.  Decision made to come to the OR for cauterization and examination under GA.   Anesthesia Other Findings   Reproductive/Obstetrics negative OB ROS                             Anesthesia Physical  Anesthesia Plan  ASA: 4 and emergent  Anesthesia Plan: General   Post-op Pain Management:    Induction: Intravenous  PONV Risk Score and Plan: 3 and 2 and Midazolam and Ondansetron  Airway Management Planned: Oral ETT  Additional Equipment:   Intra-op Plan:   Post-operative Plan:   Informed Consent: I have reviewed the patients History and Physical, chart, labs and discussed the procedure including the risks, benefits and alternatives for the proposed anesthesia with the patient or authorized representative who has indicated his/her understanding and acceptance.       Plan Discussed with: CRNA  Anesthesia Plan Comments:         Anesthesia Quick  Evaluation

## 2021-09-01 NOTE — ED Triage Notes (Signed)
Epistaxis began at midnight without trauma. Since then he has soaked through an entire role of paper towels and a roll of toilet paper. He has had two rounds of Afrin and nose plug with some control of bleeding with EMS. PTA he coughed up a good amount of blood in the ambulance.   HR 120 BP 121/79 99% RA RR 20

## 2021-09-02 ENCOUNTER — Encounter: Payer: Self-pay | Admitting: Otolaryngology

## 2021-09-12 ENCOUNTER — Other Ambulatory Visit: Payer: Self-pay | Admitting: Family

## 2021-09-16 ENCOUNTER — Other Ambulatory Visit: Payer: Self-pay | Admitting: Family

## 2021-09-16 ENCOUNTER — Other Ambulatory Visit (INDEPENDENT_AMBULATORY_CARE_PROVIDER_SITE_OTHER): Payer: Self-pay | Admitting: Nurse Practitioner

## 2021-09-16 DIAGNOSIS — I70229 Atherosclerosis of native arteries of extremities with rest pain, unspecified extremity: Secondary | ICD-10-CM

## 2021-09-16 MED ORDER — SACUBITRIL-VALSARTAN 49-51 MG PO TABS: 1.0000 | ORAL_TABLET | Freq: Two times a day (BID) | ORAL | 6 refills | Status: DC

## 2021-09-16 NOTE — Progress Notes (Signed)
Entresto RX sent to [facility] on [address]

## 2021-09-17 ENCOUNTER — Other Ambulatory Visit (HOSPITAL_BASED_OUTPATIENT_CLINIC_OR_DEPARTMENT_OTHER): Payer: Self-pay | Admitting: Family Medicine

## 2021-09-17 ENCOUNTER — Other Ambulatory Visit: Payer: Self-pay | Admitting: Family Medicine

## 2021-09-17 DIAGNOSIS — Z8619 Personal history of other infectious and parasitic diseases: Secondary | ICD-10-CM

## 2021-09-17 NOTE — Telephone Encounter (Signed)
Is this ok to fill? 

## 2021-09-26 ENCOUNTER — Ambulatory Visit: Admission: RE | Admit: 2021-09-26 | Payer: Medicaid Other | Source: Ambulatory Visit

## 2021-09-28 ENCOUNTER — Other Ambulatory Visit: Payer: Self-pay | Admitting: Surgery

## 2021-10-10 ENCOUNTER — Encounter: Payer: Self-pay | Admitting: Otolaryngology

## 2021-10-12 IMAGING — CR DG CERVICAL SPINE 2 OR 3 VIEWS
2 series · 2 of 2 positions shown · non-contrast
Comparison: April 17, 2021.

CLINICAL DATA: Status post cervical fusion.

EXAM:
CERVICAL SPINE - 2-3 VIEW

[c-spine lat]
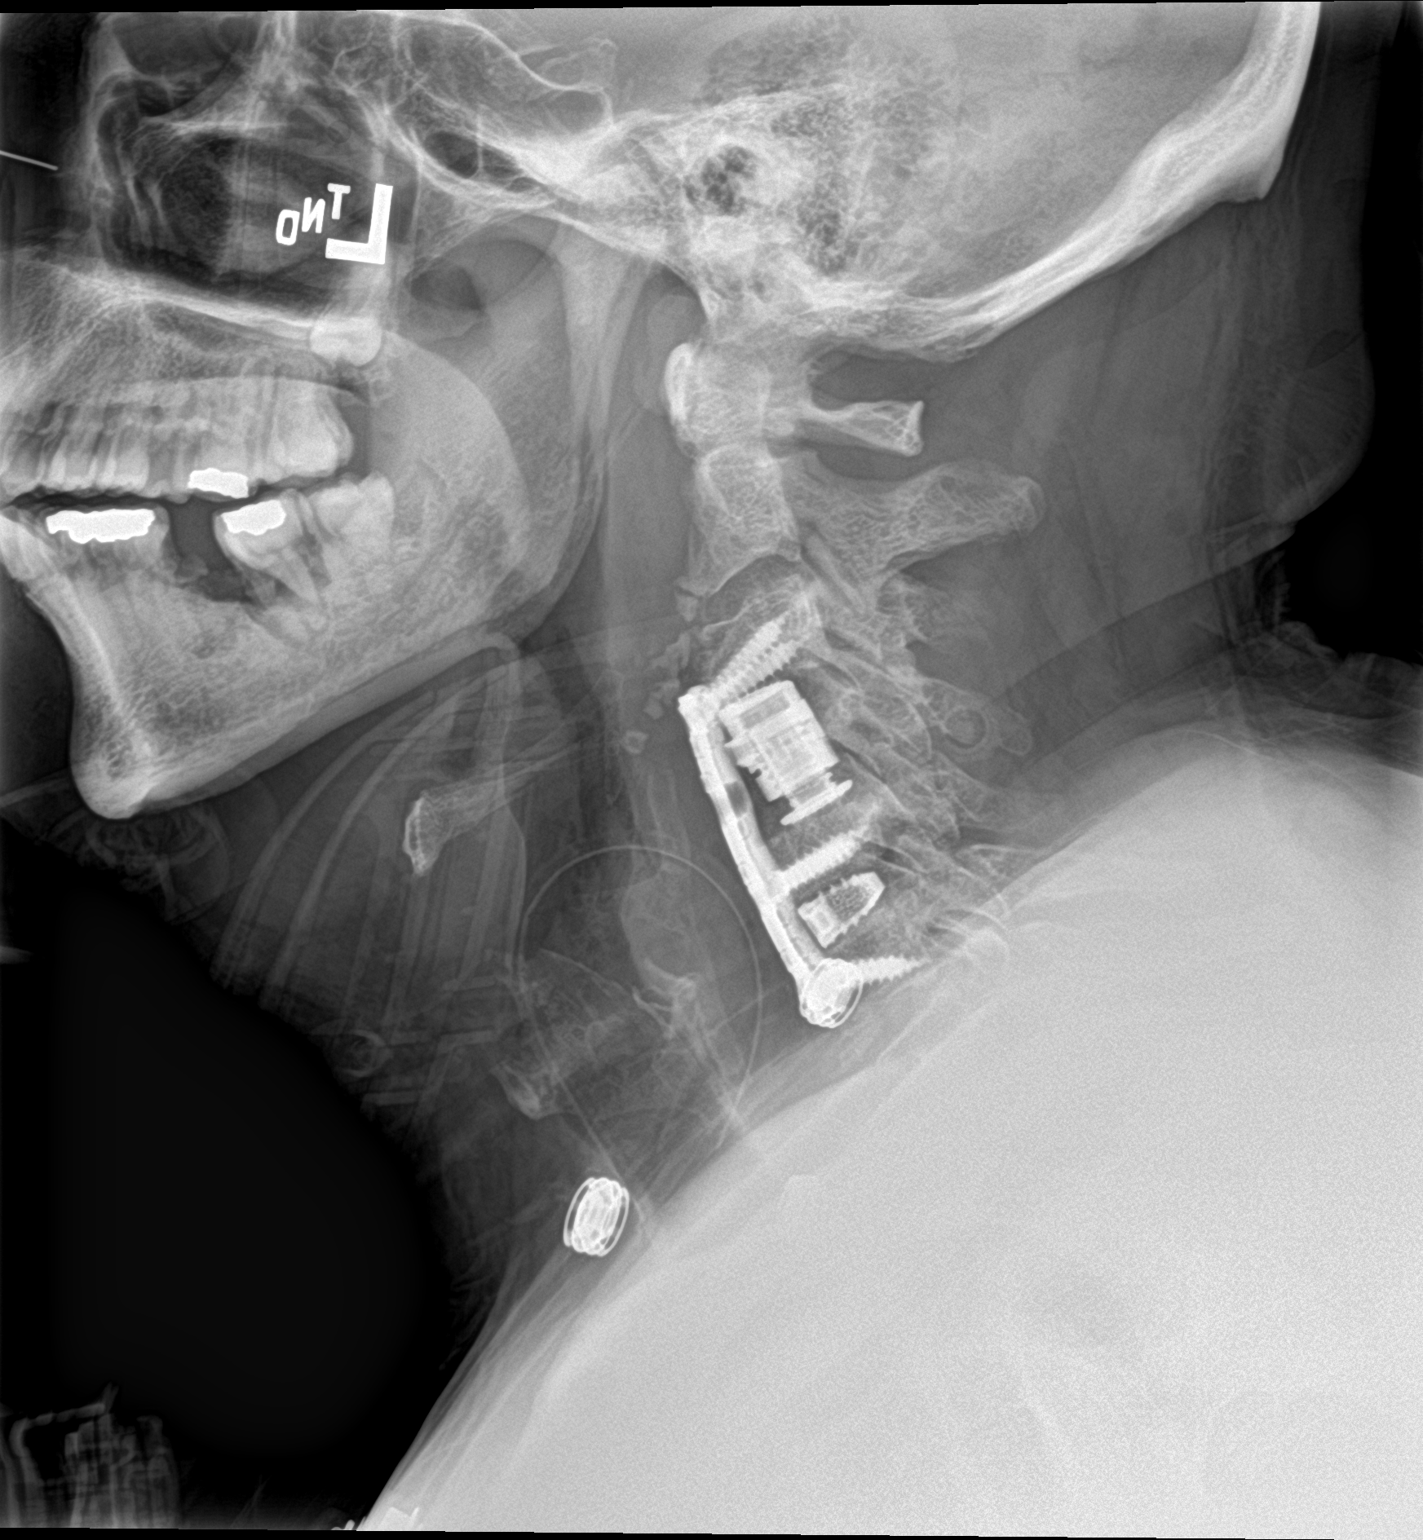

[c-spine ap]
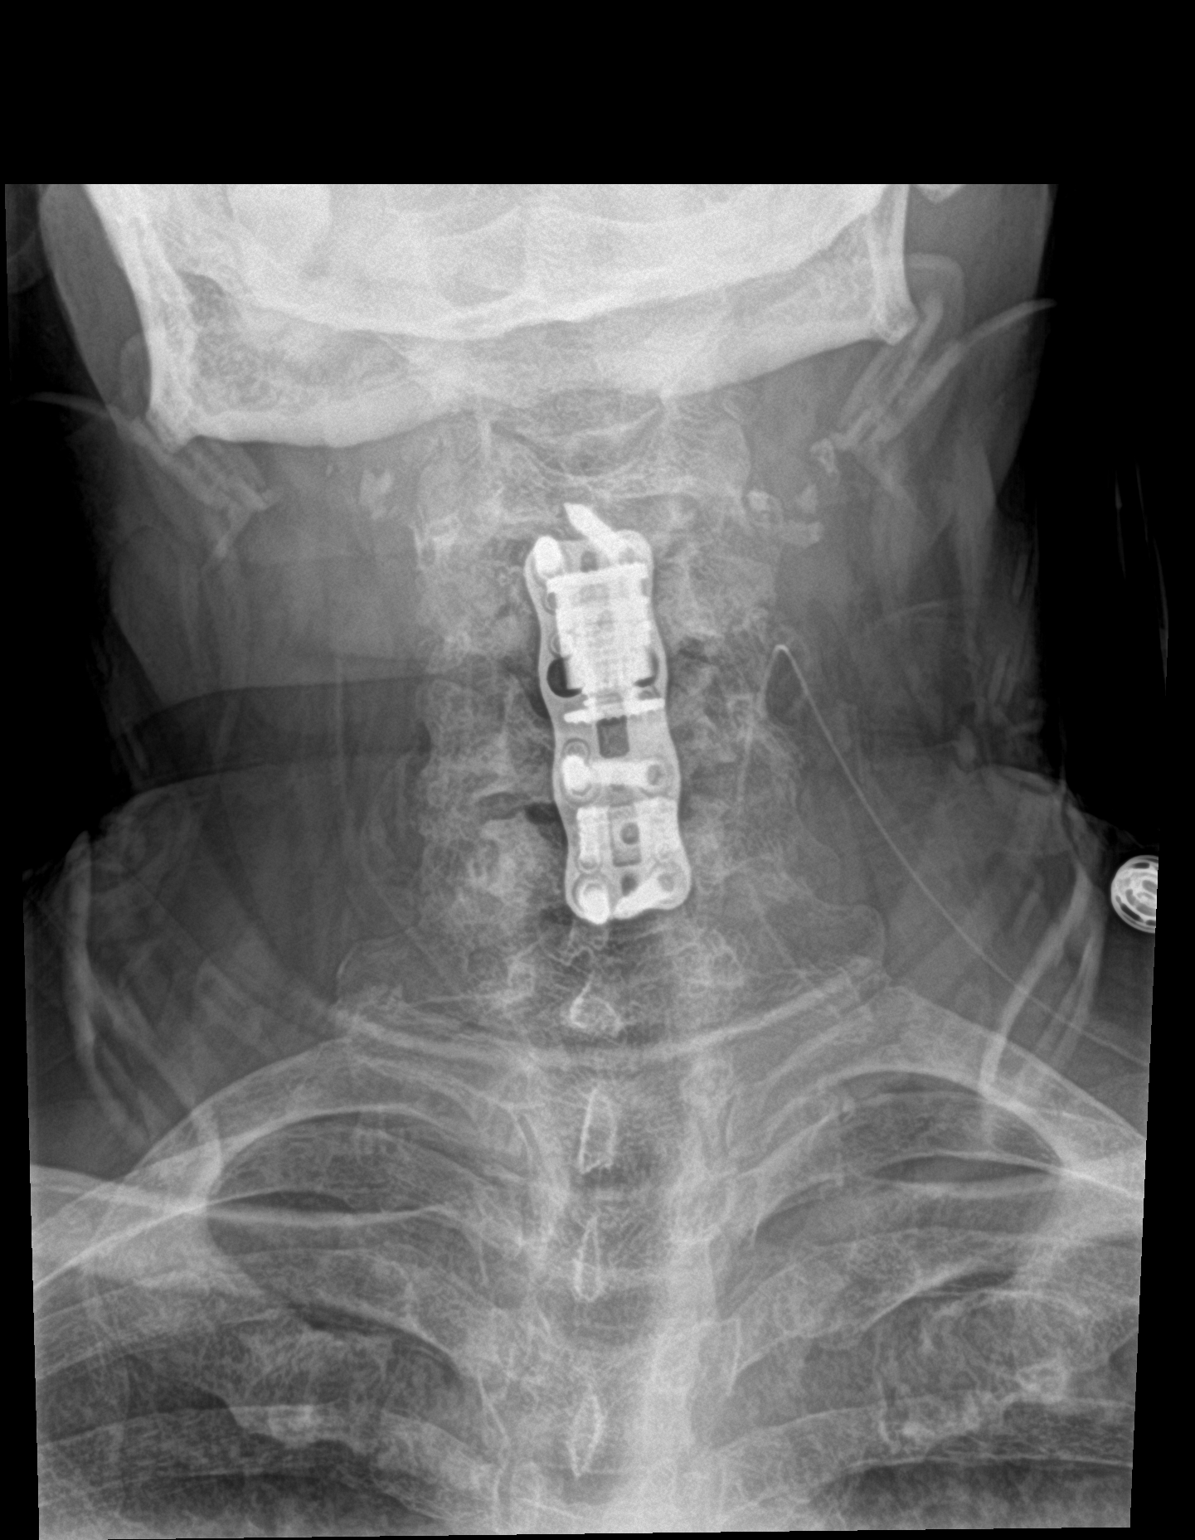

[2 of 2 positions shown; findings below may reference images not displayed]

FINDINGS: Status post surgical anterior fusion extending from C3-C6 with C4
corpectomy and spacer placement. Good alignment of vertebral bodies
is noted.
IMPRESSION: Postsurgical changes as described above.

## 2021-10-14 ENCOUNTER — Ambulatory Visit: Payer: Medicare Other | Attending: Family | Admitting: Family

## 2021-10-14 ENCOUNTER — Encounter: Payer: Self-pay | Admitting: Family

## 2021-10-14 ENCOUNTER — Other Ambulatory Visit: Payer: Self-pay

## 2021-10-14 VITALS — BP 120/70 | HR 80 | Resp 20 | Ht 68.0 in | Wt 188.2 lb

## 2021-10-14 DIAGNOSIS — I252 Old myocardial infarction: Secondary | ICD-10-CM | POA: Insufficient documentation

## 2021-10-14 DIAGNOSIS — I2510 Atherosclerotic heart disease of native coronary artery without angina pectoris: Secondary | ICD-10-CM | POA: Diagnosis not present

## 2021-10-14 DIAGNOSIS — F17210 Nicotine dependence, cigarettes, uncomplicated: Secondary | ICD-10-CM | POA: Diagnosis not present

## 2021-10-14 DIAGNOSIS — I70229 Atherosclerosis of native arteries of extremities with rest pain, unspecified extremity: Secondary | ICD-10-CM

## 2021-10-14 DIAGNOSIS — Z86718 Personal history of other venous thrombosis and embolism: Secondary | ICD-10-CM | POA: Diagnosis not present

## 2021-10-14 DIAGNOSIS — I70209 Unspecified atherosclerosis of native arteries of extremities, unspecified extremity: Secondary | ICD-10-CM | POA: Insufficient documentation

## 2021-10-14 DIAGNOSIS — Z7901 Long term (current) use of anticoagulants: Secondary | ICD-10-CM | POA: Diagnosis not present

## 2021-10-14 DIAGNOSIS — I5022 Chronic systolic (congestive) heart failure: Secondary | ICD-10-CM

## 2021-10-14 DIAGNOSIS — Z7902 Long term (current) use of antithrombotics/antiplatelets: Secondary | ICD-10-CM | POA: Diagnosis not present

## 2021-10-14 DIAGNOSIS — E785 Hyperlipidemia, unspecified: Secondary | ICD-10-CM | POA: Insufficient documentation

## 2021-10-14 DIAGNOSIS — Z8674 Personal history of sudden cardiac arrest: Secondary | ICD-10-CM | POA: Insufficient documentation

## 2021-10-14 DIAGNOSIS — I110 Hypertensive heart disease with heart failure: Secondary | ICD-10-CM | POA: Insufficient documentation

## 2021-10-14 DIAGNOSIS — I10 Essential (primary) hypertension: Secondary | ICD-10-CM

## 2021-10-14 NOTE — Patient Instructions (Signed)
Continue weighing daily and call for an overnight weight gain of > 2 pounds or a weekly weight gain of >5 pounds. 

## 2021-10-14 NOTE — Progress Notes (Signed)
Patient ID: Shawn Taylor, adult    DOB: 1956-06-08, 65 y.o.   MRN: [id number]   Shawn Taylor is a 65 y/o male with a history of HTN, atrial flutter, DVT, thrombocytopenia, alcohol use,Current tobacco use and chronic heart failure.   Echo report from 02/14/20 reviewed and showed an EF of <20% along with moderate Shawn/TR.   Stress test done 03/04/21 showed improved EF to 50%  Was in the ED 09/01/21 due to epistaxis. ENT consulted. Previous nasal packing and cauterizing had not worked. Taken to OR for emergent endoscopic control of nasal hemorrhage and then he was released.    He presents today for a follow-up visit with a chief complaint of dry cough. He says that this occurs on an intermittent basis but has been present for several years. Has associated rhinorrhea along with this. Denies any difficulty sleeping, dizziness, abdominal distention, palpitations, pedal edema, chest pain, shortness of breath, fatigue or weight gain.   Says that his son died last week and he's been dealing with that loss. Did have some beer during this time and says that he really wanted some "hard liquor" but drank the beer instead. Emotional support offered and discussed not using alcohol to treat his sadness/ loss.   Past Medical History:  Diagnosis Date   Atrial flutter (HCC)    CAD (coronary artery disease)    Cardiac arrest (HCC) 02/12/2020   WITH HIS MI   Carotid artery occlusion    Cervical myelopathy (HCC)    CHF (congestive heart failure) (HCC)    Chronic anticoagulation    Apixaban + ASA   DOE (dyspnea on exertion)    DVT (deep venous thrombosis) (HCC)    HCV (hepatitis C virus) 2019   Tx'd with Harvoni course   HLD (hyperlipidemia)    Hypertension    Migraines    Myocardial infarction (HCC) 02/12/2020   PAD (peripheral artery disease) (HCC)    Polysubstance abuse (HCC)    Cocaine and ETOH   Thrombocytopenia (HCC)    Past Surgical History:  Procedure Laterality Date   ANTERIOR CERVICAL  CORPECTOMY N/A 04/17/2021   Procedure: ANTERIOR CERVICAL CORPECTOMY C4;  Surgeon: Venetia Night, MD;  Location: [facility] ORS;  Service: Neurosurgery;  Laterality: N/A;  2nd case   ANTERIOR CERVICAL DECOMP/DISCECTOMY FUSION N/A 04/17/2021   Procedure: C5-6 ANTERIOR CERVICAL DECOMPRESSION/DISCECTOMY FUSION;  Surgeon: Venetia Night, MD;  Location: [facility] OR[facility];  Service: Neurosurgery;  Laterality: N/A;   ENDARTERECTOMY FEMORAL Right 07/24/2021   Procedure: ENDARTERECTOMY FEMORAL WITH STENT PLACEMENT;  Surgeon: Renford Dills, MD;  Location: [facility] OR[facility];  Service: Vascular;  Laterality: Right;  Femoral endarterectomy with stent   LOWER EXTREMITY ANGIOGRAPHY Right 01/29/2021   Procedure: LOWER EXTREMITY ANGIOGRAPHY;  Surgeon: Renford Dills, MD;  Location: [facility];  Service: Cardiovascular;  Laterality: Right;   NASAL ENDOSCOPY WITH EPISTAXIS CONTROL N/A 09/01/2021   Procedure: NASAL ENDOSCOPY WITH EPISTAXIS CONTROL;  Surgeon: Bud Face, MD;  Location: [facility];  Service: ENT;  Laterality: N/A;   Family History  Problem Relation Age of Onset   Diabetes Sister    Social History   Tobacco Use   Smoking status: Every Day    Packs/day: 0.25    Years: 55.00    Pack years: 13.75    Types: Cigarettes   Smokeless tobacco: Never  Substance Use Topics   Alcohol use: Not Currently    Alcohol/week: 2.0 - 3.0 standard drinks    Types: 2 - 3 Cans  of beer per week   No Known Allergies  Prior to Admission medications   Medication Sig Start Date End Date Taking? Authorizing Provider  acetaminophen (TYLENOL) 500 MG tablet Take 1,000 mg by mouth every 6 (six) hours as needed.   Yes [provider]  apixaban (ELIQUIS) 5 MG TABS tablet Take 1 tablet (5 mg total) by mouth 2 (two) times daily. Patient taking differently: Take 5 mg by mouth every 12 (twelve) hours. 03/07/20  Yes Jennye Boroughs, MD  aspirin 81 MG EC tablet Take 81 mg by mouth daily.   Yes [provider]   atorvastatin (LIPITOR) 40 MG tablet Take 1 tablet (40 mg total) by mouth daily at 10 pm. 03/07/20  Yes Jennye Boroughs, MD  clopidogrel (PLAVIX) 75 MG tablet Take 1 tablet by mouth once daily 09/30/21  Yes Serafina Mitchell, MD  dapagliflozin propanediol (FARXIGA) 10 MG TABS tablet Take 10 mg by mouth daily. TAKING FOR HIS HEART   Yes [provider]  folic acid (FOLVITE) 1 MG tablet Take 1 tablet by mouth daily. 07/11/21  Yes [provider]  furosemide (LASIX) 40 MG tablet Take 20 mg by mouth every morning.   Yes [provider]  gabapentin (NEURONTIN) 300 MG capsule TAKE ONE CAPSULE BY MOUTH AT BEDTIME FOR PAIN Patient taking differently: Take 300 mg by mouth at bedtime. 04/16/20  Yes Revelo, Elyse Jarvis, MD  hydrALAZINE (APRESOLINE) 25 MG tablet Take 25 mg by mouth 3 (three) times daily.   Yes [provider]  isosorbide mononitrate (IMDUR) 30 MG 24 hr tablet Take 30 mg by mouth every morning.   Yes [provider]  metoprolol succinate (TOPROL-XL) 25 MG 24 hr tablet Take 25 mg by mouth every morning.   Yes Pricilla Larsson, NP  oxyCODONE (OXY IR/ROXICODONE) 5 MG immediate release tablet Take 1-2 tablets (5-10 mg total) by mouth every 4 (four) hours as needed for moderate pain. 08/08/21  Yes Kris Hartmann, NP  sacubitril-valsartan (ENTRESTO) 49-51 MG Take 1 tablet by mouth 2 (two) times daily. 09/16/21  Yes Hydia Copelin, Otila Kluver A, FNP  sodium chloride (OCEAN) 0.65 % SOLN nasal spray Place 1 spray into both nostrils 4 (four) times daily. 09/01/21  Yes Vaught, Jeannie Fend, MD  spironolactone (ALDACTONE) 25 MG tablet Take 1 tablet (25 mg total) by mouth every morning. 06/25/21  Yes Darylene Price A, FNP  SSD 1 % cream APPLY  CREAM EXTERNALLY TO AFFECTED AREA ONCE DAILY 09/17/21  Yes Kris Hartmann, NP  thiamine 100 MG tablet Take 1 tablet (100 mg total) by mouth daily. Patient taking differently: Take 125 mg by mouth daily. 03/08/20  Yes Jennye Boroughs, MD   Review  of Systems  Constitutional:  Negative for appetite change and fatigue.  HENT:  Positive for rhinorrhea. Negative for congestion and sore throat.   Eyes: Negative.   Respiratory:  Positive for cough (dry cough). Negative for shortness of breath.   Cardiovascular:  Negative for chest pain, palpitations and leg swelling.  Gastrointestinal:  Negative for abdominal distention and abdominal pain.  Endocrine: Negative.   Genitourinary: Negative.   Musculoskeletal:  Negative for back pain and neck pain.  Skin: Negative.   Allergic/Immunologic: Negative.   Neurological:  Negative for dizziness, tremors, syncope, weakness and light-headedness.  Hematological:  Negative for adenopathy. Does not bruise/bleed easily.  Psychiatric/Behavioral:  Negative for dysphoric mood and sleep disturbance (sleeping on 2 pillows). The patient is not nervous/anxious.    Vitals:   10/14/21  1040  BP: 120/70  Pulse: 80  Resp: 20  SpO2: 100%  Weight: 188 lb 4 oz (85.4 kg)  Height: 5\' 8"  (1.727 m)   Wt Readings from Last 3 Encounters:  10/14/21 188 lb 4 oz (85.4 kg)  09/01/21 181 lb (82.1 kg)  08/22/21 189 lb 6.4 oz (85.9 kg)   Lab Results  Component Value Date   CREATININE 1.42 (H) 09/01/2021   CREATININE 1.09 07/27/2021   CREATININE 1.07 07/26/2021   Physical Exam Vitals and nursing note reviewed. Exam conducted with a chaperone present (aunt).  Constitutional:      General: He is not in acute distress.    Appearance: Normal appearance. He is well-developed. He is not ill-appearing, toxic-appearing or diaphoretic.  HENT:     Head: Normocephalic and atraumatic.  Neck:     Vascular: No JVD.  Cardiovascular:     Rate and Rhythm: Normal rate and regular rhythm.  Pulmonary:     Effort: Pulmonary effort is normal. No respiratory distress.     Breath sounds: No wheezing, rhonchi or rales.  Abdominal:     Tenderness: There is no abdominal tenderness.  Musculoskeletal:     Cervical back: Normal range of  motion and neck supple.     Right lower leg: No edema.     Left lower leg: No edema.  Neurological:     General: No focal deficit present.     Mental Status: He is alert and oriented to person, place, and time.  Psychiatric:        Mood and Affect: Mood normal.        Behavior: Behavior normal.   Assessment & Plan:  1: Chronic heart failure with reduced ejection fraction- - NYHA class II - euvolemic today - weighing every 1-2 days; encouraged to resume weighing daily and reminded to call for an overnight weight gain of >2 pounds or a weekly weight gain of >5 pounds - weight down 14 pounds from last visit here 4 months ago - not adding salt but does rely on others bringing food or eating convenient food - saw cardiology Nehemiah Massed) 07/04/21 - on GDMT of dapagliflozin, metoprolol, entresto, spironolactone - BNP 02/20/20 was 532.0  2: HTN- - BP looks good today - follows with PCP at [facility])  - BMP on 09/01/21 reviewed and showed sodium 134, potassium 4.6, creatinine 1.42 and GFR 55  3. Atherosclerosis of lower legs- - saw vascular (Schnier) 08/22/21 - had right femoral endarterectomy with stent on 07/24/21    Medication bottles reviewed.   Return in 6 months or sooner for any questions/problems before then.

## 2021-10-30 ENCOUNTER — Other Ambulatory Visit: Payer: Self-pay | Admitting: Gastroenterology

## 2021-10-30 DIAGNOSIS — R748 Abnormal levels of other serum enzymes: Secondary | ICD-10-CM

## 2021-11-01 ENCOUNTER — Other Ambulatory Visit: Payer: Self-pay | Admitting: Surgery

## 2021-11-21 ENCOUNTER — Ambulatory Visit (INDEPENDENT_AMBULATORY_CARE_PROVIDER_SITE_OTHER): Payer: Medicare Other

## 2021-11-21 ENCOUNTER — Other Ambulatory Visit: Payer: Self-pay

## 2021-11-21 ENCOUNTER — Ambulatory Visit (INDEPENDENT_AMBULATORY_CARE_PROVIDER_SITE_OTHER): Payer: Medicare Other | Admitting: Nurse Practitioner

## 2021-11-21 DIAGNOSIS — I70229 Atherosclerosis of native arteries of extremities with rest pain, unspecified extremity: Secondary | ICD-10-CM

## 2021-11-28 ENCOUNTER — Encounter (INDEPENDENT_AMBULATORY_CARE_PROVIDER_SITE_OTHER): Payer: Self-pay | Admitting: *Deleted

## 2021-12-09 ENCOUNTER — Other Ambulatory Visit: Payer: Self-pay | Admitting: Family

## 2021-12-09 MED ORDER — DAPAGLIFLOZIN PROPANEDIOL 10 MG PO TABS: 10.0000 mg | ORAL_TABLET | Freq: Every day | ORAL | 3 refills | Status: DC

## 2021-12-09 NOTE — Progress Notes (Signed)
Marcelline Deist RX sent to [facility]

## 2022-02-19 ENCOUNTER — Other Ambulatory Visit (INDEPENDENT_AMBULATORY_CARE_PROVIDER_SITE_OTHER): Payer: Self-pay | Admitting: Vascular Surgery

## 2022-02-19 DIAGNOSIS — I739 Peripheral vascular disease, unspecified: Secondary | ICD-10-CM

## 2022-02-20 ENCOUNTER — Ambulatory Visit (INDEPENDENT_AMBULATORY_CARE_PROVIDER_SITE_OTHER): Payer: Medicare Other

## 2022-02-20 ENCOUNTER — Ambulatory Visit (INDEPENDENT_AMBULATORY_CARE_PROVIDER_SITE_OTHER): Payer: Medicare Other | Admitting: Nurse Practitioner

## 2022-02-20 ENCOUNTER — Ambulatory Visit (INDEPENDENT_AMBULATORY_CARE_PROVIDER_SITE_OTHER): Payer: Medicare Other | Admitting: Vascular Surgery

## 2022-02-20 DIAGNOSIS — I739 Peripheral vascular disease, unspecified: Secondary | ICD-10-CM

## 2022-02-26 ENCOUNTER — Encounter (INDEPENDENT_AMBULATORY_CARE_PROVIDER_SITE_OTHER): Payer: Self-pay | Admitting: *Deleted

## 2022-04-13 NOTE — Progress Notes (Signed)
Patient ID: Shawn Taylor, adult    DOB: 1956-02-09, 66 y.o.   MRN: [id number]   Shawn Taylor is a 66 y/o male with a history of HTN, atrial flutter, DVT, thrombocytopenia, alcohol use,Current tobacco use and chronic heart failure.   Echo report from 12/03/21 reviewed and showed an EF of 35% along with moderate TR. Echo report from 02/14/20 reviewed and showed an EF of <20% along with moderate Shawn/TR.   Stress test done 03/04/21 showed improved EF to 50%  Has not been admitted or been in the ED in the last 6 months.    He presents today for a follow-up visit with a chief complaint of dry cough. He describes this as chronic in nature. He has associated leg pain (when walking) and gradual weight gain along with this. He denies any difficulty sleeping, dizziness, abdominal distention, palpitations, pedal edema, chest pain, shortness of breath or fatigue.   Admits that he's not weighing himself daily and isn't very active due to his leg pain.   Past Medical History:  Diagnosis Date   Atrial flutter (HCC)    CAD (coronary artery disease)    Cardiac arrest (HCC) 02/12/2020   WITH HIS MI   Carotid artery occlusion    Cervical myelopathy (HCC)    CHF (congestive heart failure) (HCC)    Chronic anticoagulation    Apixaban + ASA   DOE (dyspnea on exertion)    DVT (deep venous thrombosis) (HCC)    HCV (hepatitis C virus) 2019   Tx'd with Harvoni course   HLD (hyperlipidemia)    Hypertension    Migraines    Myocardial infarction (HCC) 02/12/2020   PAD (peripheral artery disease) (HCC)    Polysubstance abuse (HCC)    Cocaine and ETOH   Thrombocytopenia (HCC)    Past Surgical History:  Procedure Laterality Date   ANTERIOR CERVICAL CORPECTOMY N/A 04/17/2021   Procedure: ANTERIOR CERVICAL CORPECTOMY C4;  Surgeon: Venetia Night, MD;  Location: [facility];  Service: Neurosurgery;  Laterality: N/A;  2nd case   ANTERIOR CERVICAL DECOMP/DISCECTOMY FUSION N/A 04/17/2021   Procedure: C5-6 ANTERIOR  CERVICAL DECOMPRESSION/DISCECTOMY FUSION;  Surgeon: Venetia Night, MD;  Location: [facility];  Service: Neurosurgery;  Laterality: N/A;   ENDARTERECTOMY FEMORAL Right 07/24/2021   Procedure: ENDARTERECTOMY FEMORAL WITH STENT PLACEMENT;  Surgeon: Renford Dills, MD;  Location: [facility];  Service: Vascular;  Laterality: Right;  Femoral endarterectomy with stent   LOWER EXTREMITY ANGIOGRAPHY Right 01/29/2021   Procedure: LOWER EXTREMITY ANGIOGRAPHY;  Surgeon: Renford Dills, MD;  Location: [facility];  Service: Cardiovascular;  Laterality: Right;   NASAL ENDOSCOPY WITH EPISTAXIS CONTROL N/A 09/01/2021   Procedure: NASAL ENDOSCOPY WITH EPISTAXIS CONTROL;  Surgeon: Bud Face, MD;  Location: [facility];  Service: ENT;  Laterality: N/A;   Family History  Problem Relation Age of Onset   Diabetes Sister    Social History   Tobacco Use   Smoking status: Every Day    Packs/day: 0.25    Years: 55.00    Pack years: 13.75    Types: Cigarettes   Smokeless tobacco: Never  Substance Use Topics   Alcohol use: Not Currently    Alcohol/week: 2.0 - 3.0 standard drinks    Types: 2 - 3 Cans of beer per week   No Known Allergies  Prior to Admission medications   Medication Sig Start Date End Date Taking? Authorizing Provider  acetaminophen (TYLENOL) 500 MG tablet Take 1,000 mg by mouth every 6 (  six) hours as needed.   Yes [provider]  apixaban (ELIQUIS) 5 MG TABS tablet Take 1 tablet (5 mg total) by mouth 2 (two) times daily. Patient taking differently: Take 5 mg by mouth every 12 (twelve) hours. 03/07/20  Yes Lurene Shadow, MD  aspirin 81 MG EC tablet Take 81 mg by mouth daily.   Yes [provider]  atorvastatin (LIPITOR) 40 MG tablet Take 1 tablet (40 mg total) by mouth daily at 10 pm. 03/07/20  Yes Lurene Shadow, MD  dapagliflozin propanediol (FARXIGA) 10 MG TABS tablet Take 1 tablet (10 mg total) by mouth daily. TAKING FOR HIS HEART 12/09/21  Yes Clarisa Kindred A, FNP  famotidine (PEPCID) 20 MG tablet Take 20 mg by mouth daily.   Yes [provider]  folic acid (FOLVITE) 1 MG tablet Take 1 tablet by mouth daily. 07/11/21  Yes [provider]  gabapentin (NEURONTIN) 300 MG capsule TAKE ONE CAPSULE BY MOUTH AT BEDTIME FOR PAIN Patient taking differently: Take 300 mg by mouth at bedtime. 04/16/20  Yes Revelo, Presley Raddle, MD  hydrALAZINE (APRESOLINE) 25 MG tablet Take 25 mg by mouth 3 (three) times daily.   Yes [provider]  isosorbide mononitrate (IMDUR) 30 MG 24 hr tablet Take 30 mg by mouth every morning.   Yes [provider]  metoprolol succinate (TOPROL-XL) 25 MG 24 hr tablet Take 25 mg by mouth every morning.   Yes Rockey Situ, NP  sodium chloride (OCEAN) 0.65 % SOLN nasal spray Place 1 spray into both nostrils 4 (four) times daily. 09/01/21  Yes Vaught, Roney Mans, MD  spironolactone (ALDACTONE) 25 MG tablet Take 1 tablet (25 mg total) by mouth every morning. 06/25/21  Yes Clarisa Kindred A, FNP  Thiamine HCl (VITAMIN B-1) 250 MG tablet Take 125 mg by mouth daily.   Yes [provider]  furosemide (LASIX) 20 MG tablet Take 1 tablet (20 mg total) by mouth every morning. 04/14/22   Delma Freeze, FNP  sacubitril-valsartan (ENTRESTO) 49-51 MG Take 1 tablet by mouth 2 (two) times daily. 04/14/22   Delma Freeze, FNP  SSD 1 % cream APPLY  CREAM EXTERNALLY TO AFFECTED AREA ONCE DAILY 09/17/21   Georgiana Spinner, NP   Review of Systems  Constitutional:  Negative for appetite change and fatigue.  HENT:  Negative for congestion, rhinorrhea and sore throat.   Eyes: Negative.   Respiratory:  Positive for cough (dry cough). Negative for shortness of breath.   Cardiovascular:  Negative for chest pain, palpitations and leg swelling.  Gastrointestinal:  Negative for abdominal distention and abdominal pain.  Endocrine: Negative.   Genitourinary: Negative.   Musculoskeletal:  Positive for arthralgias (legs  hurt when walking). Negative for back pain and neck pain.  Skin: Negative.   Allergic/Immunologic: Negative.   Neurological:  Negative for dizziness, tremors, syncope, weakness and light-headedness.  Hematological:  Negative for adenopathy. Does not bruise/bleed easily.  Psychiatric/Behavioral:  Negative for dysphoric mood and sleep disturbance (sleeping on 2 pillows). The patient is not nervous/anxious.    Vitals:   04/14/22 1011  BP: (!) 96/54  Pulse: 93  Resp: 18  SpO2: 96%  Weight: 202 lb (91.6 kg)  Height: 5\' 8"  (1.727 m)   Wt Readings from Last 3 Encounters:  04/14/22 202 lb (91.6 kg)  10/14/21 188 lb 4 oz (85.4 kg)  09/01/21 181 lb (82.1 kg)   Lab Results  Component Value Date   CREATININE 1.42 (H) 09/01/2021  CREATININE 1.09 07/27/2021   CREATININE 1.07 07/26/2021   Physical Exam Vitals and nursing note reviewed. Exam conducted with a chaperone present (aunt).  Constitutional:      General: He is not in acute distress.    Appearance: Normal appearance. He is well-developed. He is not ill-appearing, toxic-appearing or diaphoretic.  HENT:     Head: Normocephalic and atraumatic.  Neck:     Vascular: No JVD.  Cardiovascular:     Rate and Rhythm: Normal rate and regular rhythm.  Pulmonary:     Effort: Pulmonary effort is normal. No respiratory distress.     Breath sounds: No wheezing, rhonchi or rales.  Abdominal:     Tenderness: There is no abdominal tenderness.  Musculoskeletal:     Cervical back: Normal range of motion and neck supple.     Right lower leg: No edema.     Left lower leg: No edema.  Neurological:     General: No focal deficit present.     Mental Status: He is alert and oriented to person, place, and time.  Psychiatric:        Mood and Affect: Mood normal.        Behavior: Behavior normal.   Assessment & Plan:  1: Chronic heart failure with reduced ejection fraction- - NYHA class I - euvolemic today - weighing every few days; encouraged  to resume weighing daily and reminded to call for an overnight weight gain of >2 pounds or a weekly weight gain of >5 pounds - weight up 14 pounds from last visit here 6 months ago - encouraged increased activity as leg pain will allow and to try and not eat too late at night - not adding salt but does rely on others bringing food or eating convenient food - saw cardiology Mariana Kaufman) 12/11/21 - on GDMT of dapagliflozin, metoprolol, entresto, spironolactone - BNP 02/20/20 was 532.0  2: HTN- - BP soft (96/54) but he's without any dizziness - follows with PCP at [facility])  - BMP on 09/01/21 reviewed and showed sodium 134, potassium 4.6, creatinine 1.42 and GFR 55  3. Atherosclerosis of lower legs- - saw vascular (Schnier) 08/22/21 - had right femoral endarterectomy with stent on 07/24/21 - says that his leg pain is worsening and he was encouraged to f/u with vascular about this    Medication bottles reviewed.   Return in 6 months, sooner if needed

## 2022-04-14 ENCOUNTER — Ambulatory Visit: Payer: Medicare Other | Attending: Family | Admitting: Family

## 2022-04-14 ENCOUNTER — Encounter: Payer: Self-pay | Admitting: Family

## 2022-04-14 VITALS — BP 96/54 | HR 93 | Resp 18 | Ht 68.0 in | Wt 202.0 lb

## 2022-04-14 DIAGNOSIS — I70229 Atherosclerosis of native arteries of extremities with rest pain, unspecified extremity: Secondary | ICD-10-CM | POA: Diagnosis not present

## 2022-04-14 DIAGNOSIS — Z86718 Personal history of other venous thrombosis and embolism: Secondary | ICD-10-CM | POA: Diagnosis not present

## 2022-04-14 DIAGNOSIS — I10 Essential (primary) hypertension: Secondary | ICD-10-CM

## 2022-04-14 DIAGNOSIS — I70223 Atherosclerosis of native arteries of extremities with rest pain, bilateral legs: Secondary | ICD-10-CM | POA: Diagnosis not present

## 2022-04-14 DIAGNOSIS — F17210 Nicotine dependence, cigarettes, uncomplicated: Secondary | ICD-10-CM | POA: Diagnosis not present

## 2022-04-14 DIAGNOSIS — Z7901 Long term (current) use of anticoagulants: Secondary | ICD-10-CM | POA: Diagnosis not present

## 2022-04-14 DIAGNOSIS — R059 Cough, unspecified: Secondary | ICD-10-CM | POA: Insufficient documentation

## 2022-04-14 DIAGNOSIS — I5022 Chronic systolic (congestive) heart failure: Secondary | ICD-10-CM | POA: Diagnosis not present

## 2022-04-14 DIAGNOSIS — M79606 Pain in leg, unspecified: Secondary | ICD-10-CM | POA: Diagnosis not present

## 2022-04-14 DIAGNOSIS — Z79899 Other long term (current) drug therapy: Secondary | ICD-10-CM | POA: Insufficient documentation

## 2022-04-14 DIAGNOSIS — I4892 Unspecified atrial flutter: Secondary | ICD-10-CM | POA: Diagnosis not present

## 2022-04-14 DIAGNOSIS — I110 Hypertensive heart disease with heart failure: Secondary | ICD-10-CM | POA: Diagnosis not present

## 2022-04-14 DIAGNOSIS — Z7182 Exercise counseling: Secondary | ICD-10-CM | POA: Diagnosis not present

## 2022-04-14 MED ORDER — FUROSEMIDE 20 MG PO TABS: 20.0000 mg | ORAL_TABLET | ORAL | 5 refills | Status: DC

## 2022-04-14 MED ORDER — SACUBITRIL-VALSARTAN 49-51 MG PO TABS: 1.0000 | ORAL_TABLET | Freq: Two times a day (BID) | ORAL | 5 refills | Status: DC

## 2022-04-14 NOTE — Patient Instructions (Addendum)
Continue weighing daily and call for an overnight weight gain of 3 pounds or more or a weekly weight gain of more than 5 pounds.   If you have voicemail, please make sure your mailbox is cleaned out so that we may leave a message and please make sure to listen to any voicemails.    Try to not eat late at night and try to move around more as your leg pain allows.

## 2022-07-08 ENCOUNTER — Telehealth (INDEPENDENT_AMBULATORY_CARE_PROVIDER_SITE_OTHER): Payer: Self-pay | Admitting: Vascular Surgery

## 2022-07-08 NOTE — Telephone Encounter (Signed)
Pt called stating that he saw "a doctor today" and they stated that he needs to either come in and see [place] immediately or go to the ED. I inquired what doctor he saw and pt could not remember. I do not see any notes in the system of an appt today. He stated it was with [place] "something". I inquired about what was going on and he stated that "something is going on with my feet". I probed more and he states that they hurt and toes are turning colors. I explained that pt should call the doctor he saw and have them call us or send some notes over to see what was going on. He said that his Aunt would call us. I was trying to explain more to the pt and he hung up the phone. I called back to try and talk to him but the phone listed is his Aunt. I explained to her what had been said to me and that after she stated that 3 toes are already black, I advised that pt should go to the ED (per the other doctor that he saw today)as we could not see him today. I also advised to call back to [place] if something changes. Aunt acknowledged that she should get him to the ED and conversation ended.

## 2022-07-08 NOTE — Telephone Encounter (Signed)
Patient called back, stating that he needed an appt ASAP. I let patient know that I also indeed didn't see any notes from any visit in his chart and that he should go to the ED if he has black toes. Patient said " I don't want to go sit over there for 6-7 hours for nothing I rather come see yall." I advised patient I did not have ny opening right now and that he should go to the ED ASAP!Shawn Taylor Patient says "well I guess they will fall off then" and proceeds to hang up on me

## 2022-07-14 ENCOUNTER — Other Ambulatory Visit: Payer: Self-pay | Admitting: Family

## 2022-08-19 ENCOUNTER — Other Ambulatory Visit (INDEPENDENT_AMBULATORY_CARE_PROVIDER_SITE_OTHER): Payer: Self-pay | Admitting: Vascular Surgery

## 2022-08-19 DIAGNOSIS — Z9889 Other specified postprocedural states: Secondary | ICD-10-CM

## 2022-08-20 NOTE — Progress Notes (Unsigned)
Patient ID: Shawn Taylor, adult   DOB: [birth year], 66 y.o.   MRN: [id number]  No chief complaint on file.   HPI Shawn Taylor is a 66 y.o. adult.     PROCEDURE 07/24/2022: Right common femoral, superficial femoral and profunda femoris endarterectomy with Cormatrix patch angioplasty. Right distal profunda femoris endarterectomy with a second CorMatrix patch angioplasty. Angioplasty and stent placement right superficial femoral and popliteal arteries with Viabahn stents to a maximum was 6 mm in diameter.   Past Medical History:  Diagnosis Date   Atrial flutter ([place])    CAD (coronary artery disease)    Cardiac arrest ([place]) 02/12/2020   WITH HIS MI   Carotid artery occlusion    Cervical myelopathy (HCC)    CHF (congestive heart failure) (HCC)    Chronic anticoagulation    Apixaban + ASA   DOE (dyspnea on exertion)    DVT (deep venous thrombosis) (HCC)    HCV (hepatitis C virus) 2019   Tx'd with Harvoni course   HLD (hyperlipidemia)    Hypertension    Migraines    Myocardial infarction ([place]) 02/12/2020   PAD (peripheral artery disease) (HCC)    Polysubstance abuse (HCC)    Cocaine and ETOH   Thrombocytopenia (HCC)     Past Surgical History:  Procedure Laterality Date   ANTERIOR CERVICAL CORPECTOMY N/A 04/17/2021   Procedure: ANTERIOR CERVICAL CORPECTOMY C4;  Surgeon: Meade Maw, MD;  Location: [facility];  Service: Neurosurgery;  Laterality: N/A;  2nd case   ANTERIOR CERVICAL DECOMP/DISCECTOMY FUSION N/A 04/17/2021   Procedure: C5-6 ANTERIOR CERVICAL DECOMPRESSION/DISCECTOMY FUSION;  Surgeon: Meade Maw, MD;  Location: [facility];  Service: Neurosurgery;  Laterality: N/A;   ENDARTERECTOMY FEMORAL Right 07/24/2021   Procedure: ENDARTERECTOMY FEMORAL WITH STENT PLACEMENT;  Surgeon: Katha Cabal, MD;  Location: [facility];  Service: Vascular;  Laterality: Right;  Femoral endarterectomy with stent   LOWER EXTREMITY ANGIOGRAPHY Right 01/29/2021   Procedure:  LOWER EXTREMITY ANGIOGRAPHY;  Surgeon: Katha Cabal, MD;  Location: [place] [facility];  Service: Cardiovascular;  Laterality: Right;   NASAL ENDOSCOPY WITH EPISTAXIS CONTROL N/A 09/01/2021   Procedure: NASAL ENDOSCOPY WITH EPISTAXIS CONTROL;  Surgeon: Carloyn Manner, MD;  Location: [facility];  Service: ENT;  Laterality: N/A;      No Known Allergies  Current Outpatient Medications  Medication Sig Dispense Refill   acetaminophen (TYLENOL) 500 MG tablet Take 1,000 mg by mouth every 6 (six) hours as needed.     apixaban (ELIQUIS) 5 MG TABS tablet Take 1 tablet (5 mg total) by mouth 2 (two) times daily. (Patient taking differently: Take 5 mg by mouth every 12 (twelve) hours.) 60 tablet 0   aspirin 81 MG EC tablet Take 81 mg by mouth daily.     atorvastatin (LIPITOR) 40 MG tablet Take 1 tablet (40 mg total) by mouth daily at 10 pm. 30 tablet 0   dapagliflozin propanediol (FARXIGA) 10 MG TABS tablet Take 1 tablet (10 mg total) by mouth daily. TAKING FOR HIS HEART 90 tablet 3   famotidine (PEPCID) 20 MG tablet Take 20 mg by mouth daily.     folic acid (FOLVITE) 1 MG tablet Take 1 tablet by mouth daily.     furosemide (LASIX) 20 MG tablet TAKE 1 TABLET BY MOUTH ONCE DAILY IN THE MORNING 90 tablet 3   gabapentin (NEURONTIN) 300 MG capsule TAKE ONE CAPSULE BY MOUTH AT BEDTIME FOR PAIN (Patient taking differently: Take 300 mg by mouth  at bedtime.) 90 capsule 2   hydrALAZINE (APRESOLINE) 25 MG tablet Take 25 mg by mouth 3 (three) times daily.     isosorbide mononitrate (IMDUR) 30 MG 24 hr tablet Take 30 mg by mouth every morning.     metoprolol succinate (TOPROL-XL) 25 MG 24 hr tablet Take 25 mg by mouth every morning.     sacubitril-valsartan (ENTRESTO) 49-51 MG Take 1 tablet by mouth 2 (two) times daily. 60 tablet 5   sodium chloride (OCEAN) 0.65 % SOLN nasal spray Place 1 spray into both nostrils 4 (four) times daily. 37.5 mL 0   spironolactone (ALDACTONE) 25 MG tablet Take 1 tablet (25 mg  total) by mouth every morning. 90 tablet 3   SSD 1 % cream APPLY  CREAM EXTERNALLY TO AFFECTED AREA ONCE DAILY 50 g 0   Thiamine HCl (VITAMIN B-1) 250 MG tablet Take 125 mg by mouth daily.     No current facility-administered medications for this visit.        Physical Exam There were no vitals taken for this visit. Gen:  WD/WN, NAD Skin: incision C/D/I     Assessment/Plan:  No problem-specific Assessment & Plan notes found for this encounter.      Levora Dredge 08/20/2022, 12:03 PM   This note was created with Dragon medical transcription system.  Any errors from dictation are unintention

## 2022-08-21 ENCOUNTER — Ambulatory Visit (INDEPENDENT_AMBULATORY_CARE_PROVIDER_SITE_OTHER): Payer: Medicare Other

## 2022-08-21 ENCOUNTER — Ambulatory Visit (INDEPENDENT_AMBULATORY_CARE_PROVIDER_SITE_OTHER): Payer: Medicare Other | Admitting: Vascular Surgery

## 2022-08-21 ENCOUNTER — Encounter (INDEPENDENT_AMBULATORY_CARE_PROVIDER_SITE_OTHER): Payer: Self-pay | Admitting: Vascular Surgery

## 2022-08-21 VITALS — BP 106/65 | HR 76 | Resp 16 | Wt 202.0 lb

## 2022-08-21 DIAGNOSIS — I25119 Atherosclerotic heart disease of native coronary artery with unspecified angina pectoris: Secondary | ICD-10-CM | POA: Diagnosis not present

## 2022-08-21 DIAGNOSIS — Z9889 Other specified postprocedural states: Secondary | ICD-10-CM

## 2022-08-21 DIAGNOSIS — E782 Mixed hyperlipidemia: Secondary | ICD-10-CM

## 2022-08-21 DIAGNOSIS — I2510 Atherosclerotic heart disease of native coronary artery without angina pectoris: Secondary | ICD-10-CM | POA: Insufficient documentation

## 2022-08-21 DIAGNOSIS — I70229 Atherosclerosis of native arteries of extremities with rest pain, unspecified extremity: Secondary | ICD-10-CM | POA: Diagnosis not present

## 2022-08-21 DIAGNOSIS — I10 Essential (primary) hypertension: Secondary | ICD-10-CM

## 2022-08-21 DIAGNOSIS — I739 Peripheral vascular disease, unspecified: Secondary | ICD-10-CM

## 2022-08-28 ENCOUNTER — Telehealth (INDEPENDENT_AMBULATORY_CARE_PROVIDER_SITE_OTHER): Payer: Self-pay

## 2022-08-28 ENCOUNTER — Other Ambulatory Visit: Payer: Self-pay | Admitting: Family

## 2022-08-28 NOTE — Telephone Encounter (Signed)
Patient was called and the significant other answered and the patient is scheduled with Dr. Delana Meyer for a right leg angio on 09/23/22 with a 6:45 am arrival time to the [facility]. Pre-procedure instructions were discussed and will be mailed.

## 2022-09-09 ENCOUNTER — Telehealth (INDEPENDENT_AMBULATORY_CARE_PROVIDER_SITE_OTHER): Payer: Self-pay

## 2022-09-09 NOTE — Telephone Encounter (Signed)
I received a message from Jasmine Maceachern to cancel the patient's right leg angio on 09/23/22 with Dr. Delana Meyer. Patient has been canceled and a message was left stating the patient was canceled.

## 2022-09-19 ENCOUNTER — Other Ambulatory Visit: Payer: Self-pay | Admitting: Family

## 2022-09-22 ENCOUNTER — Encounter (INDEPENDENT_AMBULATORY_CARE_PROVIDER_SITE_OTHER): Payer: Self-pay

## 2022-09-23 ENCOUNTER — Ambulatory Visit: Admit: 2022-09-23 | Payer: Medicare Other | Admitting: Vascular Surgery

## 2022-09-23 DIAGNOSIS — I70229 Atherosclerosis of native arteries of extremities with rest pain, unspecified extremity: Secondary | ICD-10-CM

## 2022-09-23 SURGERY — LOWER EXTREMITY ANGIOGRAPHY
Anesthesia: Moderate Sedation | Site: Leg Lower | Laterality: Right

## 2022-10-01 NOTE — Telephone Encounter (Signed)
Error

## 2022-10-10 NOTE — Progress Notes (Unsigned)
Patient ID: Shawn Taylor, adult    DOB: 1956-05-16, 66 y.o.   MRN: [id number]   Shawn Taylor is a 66 y/o male with a history of HTN, atrial flutter, DVT, thrombocytopenia, alcohol use,Current tobacco use and chronic heart failure.   Echo report from 12/03/21 reviewed and showed an EF of 35% along with moderate TR. Echo report from 02/14/20 reviewed and showed an EF of <20% along with moderate Shawn/TR.   Stress test done 03/04/21 showed improved EF to 50%  Has not been admitted or been in the ED in the last 6 months.    He presents today for a follow-up visit with a chief complaint of dry cough. Describes this as chronic in nature. Has associated chronic leg pain along with this. He denies any difficulty sleeping, abdominal distention, palpitations, pedal edema, chest pain, shortness of breath, dizziness or fatigue.   Not weighing daily but does have scales.   He is now doing his pill box but admits that he occasionally gets confused. Aunt had been doing them but she's trying to get away from this and getting him to do them correctly. Doesn't have entresto or metoprolol with him but his aunt knows he has entresto at home because she picked it up. She doesn't think he has metoprolol.   Past Medical History:  Diagnosis Date   Atrial flutter (HCC)    CAD (coronary artery disease)    Cardiac arrest (HCC) 02/12/2020   WITH HIS MI   Carotid artery occlusion    Cervical myelopathy (HCC)    CHF (congestive heart failure) (HCC)    Chronic anticoagulation    Apixaban + ASA   DOE (dyspnea on exertion)    DVT (deep venous thrombosis) (HCC)    HCV (hepatitis C virus) 2019   Tx'd with Harvoni course   HLD (hyperlipidemia)    Hypertension    Migraines    Myocardial infarction (HCC) 02/12/2020   PAD (peripheral artery disease) (HCC)    Polysubstance abuse (HCC)    Cocaine and ETOH   Thrombocytopenia (HCC)    Past Surgical History:  Procedure Laterality Date   ANTERIOR CERVICAL CORPECTOMY N/A  04/17/2021   Procedure: ANTERIOR CERVICAL CORPECTOMY C4;  Surgeon: Venetia Night, MD;  Location: [facility] ORS;  Service: Neurosurgery;  Laterality: N/A;  2nd case   ANTERIOR CERVICAL DECOMP/DISCECTOMY FUSION N/A 04/17/2021   Procedure: C5-6 ANTERIOR CERVICAL DECOMPRESSION/DISCECTOMY FUSION;  Surgeon: Venetia Night, MD;  Location: [facility] OR[facility];  Service: Neurosurgery;  Laterality: N/A;   ENDARTERECTOMY FEMORAL Right 07/24/2021   Procedure: ENDARTERECTOMY FEMORAL WITH STENT PLACEMENT;  Surgeon: Renford Dills, MD;  Location: [facility] OR[facility];  Service: Vascular;  Laterality: Right;  Femoral endarterectomy with stent   LOWER EXTREMITY ANGIOGRAPHY Right 01/29/2021   Procedure: LOWER EXTREMITY ANGIOGRAPHY;  Surgeon: Renford Dills, MD;  Location: [facility];  Service: Cardiovascular;  Laterality: Right;   NASAL ENDOSCOPY WITH EPISTAXIS CONTROL N/A 09/01/2021   Procedure: NASAL ENDOSCOPY WITH EPISTAXIS CONTROL;  Surgeon: Bud Face, MD;  Location: [facility] OR[facility];  Service: ENT;  Laterality: N/A;   Family History  Problem Relation Age of Onset   Diabetes Sister    Social History   Tobacco Use   Smoking status: Every Day    Packs/day: 0.25    Years: 55.00    Total pack years: 13.75    Types: Cigarettes   Smokeless tobacco: Never  Substance Use Topics   Alcohol use: Not Currently    Alcohol/week: 2.0 - 3.0  standard drinks of alcohol    Types: 2 - 3 Cans of beer per week   No Known Allergies  Prior to Admission medications   Medication Sig Start Date End Date Taking? Authorizing Provider  acetaminophen (TYLENOL) 500 MG tablet Take 1,000 mg by mouth every 6 (six) hours as needed.   Yes [provider]  apixaban (ELIQUIS) 5 MG TABS tablet Take 1 tablet (5 mg total) by mouth 2 (two) times daily. Patient taking differently: Take 5 mg by mouth every 12 (twelve) hours. 03/07/20  Yes Lurene Shadow, MD  aspirin 81 MG EC tablet Take 81 mg by mouth daily.   Yes [provider]  atorvastatin (LIPITOR) 40 MG tablet Take 1 tablet (40 mg total) by mouth daily at 10 pm. 03/07/20  Yes Lurene Shadow, MD  dapagliflozin propanediol (FARXIGA) 10 MG TABS tablet Take 1 tablet (10 mg total) by mouth daily. TAKING FOR HIS HEART 12/09/21  Yes Delma Freeze, FNP  ENTRESTO 49-51 MG Take 1 tablet by mouth twice daily 09/19/22  Yes Clarisa Kindred A, FNP  famotidine (PEPCID) 20 MG tablet Take 20 mg by mouth daily.   Yes [provider]  folic acid (FOLVITE) 1 MG tablet Take 1 tablet by mouth daily. 07/11/21  Yes [provider]  furosemide (LASIX) 20 MG tablet TAKE 1 TABLET BY MOUTH ONCE DAILY IN THE MORNING 07/14/22  Yes Gracious Renken A, FNP  gabapentin (NEURONTIN) 300 MG capsule TAKE ONE CAPSULE BY MOUTH AT BEDTIME FOR PAIN Patient taking differently: Take 300 mg by mouth at bedtime. 04/16/20  Yes Revelo, Presley Raddle, MD  hydrALAZINE (APRESOLINE) 25 MG tablet Take 25 mg by mouth 3 (three) times daily.   Yes [provider]  isosorbide mononitrate (IMDUR) 30 MG 24 hr tablet Take 30 mg by mouth every morning.   Yes [provider]  spironolactone (ALDACTONE) 25 MG tablet TAKE 1 TABLET BY MOUTH ONCE DAILY IN THE MORNING 08/28/22  Yes Simon Llamas A, FNP  Thiamine HCl (VITAMIN B-1) 250 MG tablet Take 125 mg by mouth daily.   Yes [provider]   Review of Systems  Constitutional:  Negative for appetite change and fatigue.  HENT:  Negative for congestion, rhinorrhea and sore throat.   Eyes: Negative.   Respiratory:  Positive for cough (dry cough). Negative for shortness of breath.   Cardiovascular:  Negative for chest pain, palpitations and leg swelling.  Gastrointestinal:  Negative for abdominal distention and abdominal pain.  Endocrine: Negative.   Genitourinary: Negative.   Musculoskeletal:  Positive for arthralgias (legs hurt when walking). Negative for back pain and neck pain.  Skin: Negative.   Allergic/Immunologic: Negative.    Neurological:  Negative for dizziness, weakness and light-headedness.  Hematological:  Negative for adenopathy. Does not bruise/bleed easily.  Psychiatric/Behavioral:  Negative for dysphoric mood and sleep disturbance (sleeping on 2 pillows). The patient is not nervous/anxious.    Vitals:   10/13/22 1032  BP: 130/76  Pulse: 90  Resp: 18  SpO2: 98%  Weight: 204 lb (92.5 kg)   Wt Readings from Last 3 Encounters:  10/13/22 204 lb (92.5 kg)  08/21/22 202 lb (91.6 kg)  04/14/22 202 lb (91.6 kg)   Lab Results  Component Value Date   CREATININE 1.42 (H) 09/01/2021   CREATININE 1.09 07/27/2021   CREATININE 1.07 07/26/2021   Physical Exam Vitals and nursing note reviewed. Exam conducted with a chaperone present (aunt).  Constitutional:      General:  He is not in acute distress.    Appearance: Normal appearance. He is well-developed. He is not ill-appearing, toxic-appearing or diaphoretic.  HENT:     Head: Normocephalic and atraumatic.  Neck:     Vascular: No JVD.  Cardiovascular:     Rate and Rhythm: Normal rate and regular rhythm.  Pulmonary:     Effort: Pulmonary effort is normal. No respiratory distress.     Breath sounds: No wheezing, rhonchi or rales.  Abdominal:     Tenderness: There is no abdominal tenderness.  Musculoskeletal:     Cervical back: Normal range of motion and neck supple.     Right lower leg: No edema.     Left lower leg: No edema.  Neurological:     General: No focal deficit present.     Mental Status: He is alert and oriented to person, place, and time.  Psychiatric:        Mood and Affect: Mood normal.        Behavior: Behavior normal.    Assessment & Plan:  1: Chronic heart failure with reduced ejection fraction- - NYHA class I - euvolemic today - weighing every few days; encouraged to resume weighing daily and reminded to call for an overnight weight gain of >2 pounds or a weekly weight gain of >5 pounds - weight up 2 pounds from last visit  here 6 months ago - encouraged increased activity as leg pain will allow and to try and not eat too late at night - not adding salt but does rely on others bringing food or eating convenient food - saw cardiology Mariana Kaufman) 12/11/21 - on GDMT of dapagliflozin, metoprolol, entresto, spironolactone although has been out of metoprolol and this was refilled today  - discussed paramedicine program for medication assistance and he was interested so referral made; he may benefit from pill packs from pharmacy as well - BNP 02/20/20 was 532.0 - doesn't take the flu vaccine  2: HTN- - BP looks good (130/76) - follows with PCP at [facility])  - BMP on 09/01/21 reviewed and showed sodium 134, potassium 4.6, creatinine 1.42 and GFR 55 - unable to reach PCP office to find out about labs so BMP ordered here today  3. Atherosclerosis of lower legs- - saw vascular (Schnier) 08/21/22 - had right femoral endarterectomy with stent on 07/24/21 - says that his leg pain is worsening and he was encouraged to f/u with vascular about this    Medication bottles reviewed.   Return in 2 months, sooner if needed

## 2022-10-13 ENCOUNTER — Encounter: Payer: Self-pay | Admitting: Family

## 2022-10-13 ENCOUNTER — Other Ambulatory Visit
Admission: RE | Admit: 2022-10-13 | Discharge: 2022-10-13 | Disposition: A | Discharge status: Home / Self Care | Payer: Medicare Other | Source: Ambulatory Visit | Attending: Family | Admitting: Family

## 2022-10-13 ENCOUNTER — Ambulatory Visit (HOSPITAL_BASED_OUTPATIENT_CLINIC_OR_DEPARTMENT_OTHER): Payer: Medicare Other | Admitting: Family

## 2022-10-13 VITALS — BP 130/76 | HR 90 | Resp 18 | Wt 204.0 lb

## 2022-10-13 DIAGNOSIS — I7090 Unspecified atherosclerosis: Secondary | ICD-10-CM | POA: Insufficient documentation

## 2022-10-13 DIAGNOSIS — I70229 Atherosclerosis of native arteries of extremities with rest pain, unspecified extremity: Secondary | ICD-10-CM

## 2022-10-13 DIAGNOSIS — I10 Essential (primary) hypertension: Secondary | ICD-10-CM | POA: Diagnosis not present

## 2022-10-13 DIAGNOSIS — I5022 Chronic systolic (congestive) heart failure: Secondary | ICD-10-CM

## 2022-10-13 DIAGNOSIS — I110 Hypertensive heart disease with heart failure: Secondary | ICD-10-CM | POA: Insufficient documentation

## 2022-10-13 DIAGNOSIS — I4892 Unspecified atrial flutter: Secondary | ICD-10-CM | POA: Insufficient documentation

## 2022-10-13 DIAGNOSIS — D696 Thrombocytopenia, unspecified: Secondary | ICD-10-CM | POA: Insufficient documentation

## 2022-10-13 DIAGNOSIS — Z86718 Personal history of other venous thrombosis and embolism: Secondary | ICD-10-CM | POA: Insufficient documentation

## 2022-10-13 LAB — BASIC METABOLIC PANEL
Anion gap: 9 (ref 5–15)
BUN: 34 mg/dL — ABNORMAL HIGH (ref 8–23)
CO2: 18 mmol/L — ABNORMAL LOW (ref 22–32)
Calcium: 9.8 mg/dL (ref 8.9–10.3)
Chloride: 110 mmol/L (ref 98–111)
Creatinine, Ser: 1.59 mg/dL — ABNORMAL HIGH (ref 0.61–1.24)
GFR, Estimated: 48 mL/min — ABNORMAL LOW (ref >60)
Glucose, Bld: 107 mg/dL — ABNORMAL HIGH (ref 70–99)
Potassium: 4.8 mmol/L (ref 3.5–5.1)
Sodium: 137 mmol/L (ref 135–145)

## 2022-10-13 MED ORDER — METOPROLOL SUCCINATE ER 25 MG PO TB24: 25.0000 mg | ORAL_TABLET | ORAL | 3 refills | Status: DC

## 2022-10-13 NOTE — Patient Instructions (Addendum)
Medication Changes:  Resume metoprolol as 1 tablet every day  Lab Work:  Labs done today, your results will be available in MyChart, we will contact you for abnormal readings.   Testing/Procedures:  None  Referrals:  Paramedic will call you to set up appointment.   Special Instructions // Education:  Do the following things EVERYDAY: Weigh yourself in the morning before breakfast. Write it down and keep it in a log. Take your medicines as prescribed Eat low salt foods--Limit salt (sodium) to 2000 mg per day.  Stay as active as you can everyday Limit all fluids for the day to less than 2 liters   Follow-Up in:  2 months    If you have any questions or concerns before your next appointment please send [place] a message through [place] or call our office at [phone]     If you have voicemail, please make sure your mailbox is cleaned out so that we may leave a message and please make sure to listen to any voicemails.    If you receive a satisfaction survey regarding the [facility] Failure Clinic, please take the time to fill it out. This way we can continue to provide excellent care and make any changes that need to be made.

## 2022-11-03 ENCOUNTER — Telehealth (HOSPITAL_COMMUNITY): Payer: Self-pay

## 2022-11-03 NOTE — Telephone Encounter (Signed)
He was referred to me as a new patient.  Have called a couple of times and left message with no return.  Will continue to try.   Earmon [place] EMT-Paramedic [phone]

## 2022-11-07 ENCOUNTER — Other Ambulatory Visit: Payer: Self-pay | Admitting: Family

## 2022-11-07 MED ORDER — FOLIC ACID 1 MG PO TABS: 1.0000 mg | ORAL_TABLET | Freq: Every day | ORAL | 5 refills | Status: DC

## 2022-11-07 NOTE — Progress Notes (Signed)
Folic acid RX sent to pharmacy

## 2022-12-12 ENCOUNTER — Encounter: Payer: Self-pay | Admitting: Pharmacy Technician

## 2022-12-12 ENCOUNTER — Ambulatory Visit: Payer: 59 | Attending: Family | Admitting: Family

## 2022-12-12 ENCOUNTER — Encounter: Payer: Self-pay | Admitting: Family

## 2022-12-12 VITALS — BP 110/62 | HR 94 | Resp 20 | Wt 209.2 lb

## 2022-12-12 DIAGNOSIS — I110 Hypertensive heart disease with heart failure: Secondary | ICD-10-CM | POA: Diagnosis not present

## 2022-12-12 DIAGNOSIS — Z86718 Personal history of other venous thrombosis and embolism: Secondary | ICD-10-CM | POA: Diagnosis not present

## 2022-12-12 DIAGNOSIS — Z79899 Other long term (current) drug therapy: Secondary | ICD-10-CM | POA: Insufficient documentation

## 2022-12-12 DIAGNOSIS — R42 Dizziness and giddiness: Secondary | ICD-10-CM | POA: Diagnosis not present

## 2022-12-12 DIAGNOSIS — R059 Cough, unspecified: Secondary | ICD-10-CM | POA: Insufficient documentation

## 2022-12-12 DIAGNOSIS — I10 Essential (primary) hypertension: Secondary | ICD-10-CM | POA: Diagnosis not present

## 2022-12-12 DIAGNOSIS — I5022 Chronic systolic (congestive) heart failure: Secondary | ICD-10-CM | POA: Insufficient documentation

## 2022-12-12 DIAGNOSIS — F17210 Nicotine dependence, cigarettes, uncomplicated: Secondary | ICD-10-CM | POA: Insufficient documentation

## 2022-12-12 DIAGNOSIS — I70229 Atherosclerosis of native arteries of extremities with rest pain, unspecified extremity: Secondary | ICD-10-CM | POA: Diagnosis not present

## 2022-12-12 NOTE — Patient Instructions (Signed)
Continue weighing daily and call for an overnight weight gain of 3 pounds or more or a weekly weight gain of more than 5 pounds  If you have voicemail, please make sure your mailbox is cleaned out so that we may leave a message and please make sure to listen to any voicemails.    If you receive a satisfaction survey regarding the [facility] Failure Clinic, please take the time to fill it out. This way we can continue to provide excellent care and make any changes that need to be made.     

## 2022-12-12 NOTE — Progress Notes (Signed)
[place] - PHARMACIST COUNSELING NOTE  Guideline-Directed Medical Therapy/Evidence Based Medicine  ACE/ARB/ARNI: Sacubitril-valsartan 49-51 mg twice daily Beta Blocker: Metoprolol succinate 25 mg daily Aldosterone Antagonist: Spironolactone 25 mg daily Diuretic: Furosemide 20 mg daily SGLT2i: Dapagliflozin 10 mg daily  Adherence Assessment  Do you ever forget to take your medication? [] Yes [x] No  Do you ever skip doses due to side effects? [] Yes [x] No  Do you have trouble affording your medicines? [] Yes [x] No  Are you ever unable to pick up your medication due to transportation difficulties? [] Yes [x] No  Do you ever stop taking your medications because you don't believe they are helping? [] Yes [x] No  Do you check your weight daily? [] Yes [x] No   Adherence strategy: Pill box  Barriers to obtaining medications: None  Vital signs: HR 94, BP 110/62, weight (pounds) 209 ECHO: Date 11/2021, EF 35-40%     Latest Ref Rng & Units 10/13/2022   11:37 AM 09/01/2021    6:30 AM 07/27/2021    5:26 AM  BMP  Glucose 70 - 99 mg/dL 107  152  114   BUN 8 - 23 mg/dL 34  45  19   Creatinine 0.61 - 1.24 mg/dL 1.59  1.42  1.09   Sodium 135 - 145 mmol/L 137  134  137   Potassium 3.5 - 5.1 mmol/L 4.8  4.6  4.3   Chloride 98 - 111 mmol/L 110  103  108   CO2 22 - 32 mmol/L 18  19  23    Calcium 8.9 - 10.3 mg/dL 9.8  8.8  8.6     Past Medical History:  Diagnosis Date   Atrial flutter (HCC)    CAD (coronary artery disease)    Cardiac arrest ([place]) 02/12/2020   WITH HIS MI   Carotid artery occlusion    Cervical myelopathy (HCC)    CHF (congestive heart failure) (HCC)    Chronic anticoagulation    Apixaban + ASA   DOE (dyspnea on exertion)    DVT (deep venous thrombosis) (HCC)    HCV (hepatitis C virus) 2019   Tx'd with Harvoni course   HLD (hyperlipidemia)    Hypertension    Migraines    Myocardial infarction ([place]) 02/12/2020   PAD  (peripheral artery disease) (HCC)    Polysubstance abuse (HCC)    Cocaine and ETOH   Thrombocytopenia [facility])     ASSESSMENT 67 year old male with PMH HTN, Aflutter, CAD w/ MI (01/2020), HLD, DM (8/22 A1c 6.8%) who presents to the HF clinic for follow-up. Most recent ECHO in 11/2021 shows EF 35-40%. Regarding GDMT, patient takes Entresto 49/51mg  twice daily, Farxiga 10 mg daily, spironolactone 25 mg daily, and Toprol XL 25 mg daily. Patient also takes furosemide 20 mg daily. Patient checks weights a few times week and there have been no changes. Patient had no questions or concerns for me today.  Recent ED Visit (past 6 months): None  PLAN CHF/HTN Continue Entresto, Farxiga, spironolactone, Toprol XL, Lasix, Hydral Recommend re-checking BMP at next visit  CAD w/ MI (01/2020) 01/2022 LDL 55 Continue Imdur, atorvastatin, bASA  Aflutter CHADSVASc 5 Continue Eliquis, Toprol XL   Time spent: 15 minutes  Will M. Ouida Sills, PharmD PGY-1 Pharmacy Resident 12/12/2022 12:08 PM   Current Outpatient Medications:    acetaminophen (TYLENOL) 500 MG tablet, Take 1,000 mg by mouth every 6 (six) hours as needed., Disp: , Rfl:    apixaban (ELIQUIS) 5 MG TABS tablet, Take 1  tablet (5 mg total) by mouth 2 (two) times daily. (Patient taking differently: Take 5 mg by mouth every 12 (twelve) hours.), Disp: 60 tablet, Rfl: 0   aspirin 81 MG EC tablet, Take 81 mg by mouth daily., Disp: , Rfl:    atorvastatin (LIPITOR) 40 MG tablet, Take 1 tablet (40 mg total) by mouth daily at 10 pm., Disp: 30 tablet, Rfl: 0   dapagliflozin propanediol (FARXIGA) 10 MG TABS tablet, Take 1 tablet (10 mg total) by mouth daily. TAKING FOR HIS HEART, Disp: 90 tablet, Rfl: 3   ENTRESTO 49-51 MG, Take 1 tablet by mouth twice daily, Disp: 60 tablet, Rfl: 5   folic acid (FOLVITE) 1 MG tablet, Take 1 tablet (1 mg total) by mouth daily., Disp: 30 tablet, Rfl: 5   furosemide (LASIX) 20 MG tablet, TAKE 1 TABLET BY MOUTH ONCE DAILY IN THE  MORNING, Disp: 90 tablet, Rfl: 3   gabapentin (NEURONTIN) 300 MG capsule, TAKE ONE CAPSULE BY MOUTH AT BEDTIME FOR PAIN (Patient taking differently: Take 300 mg by mouth at bedtime.), Disp: 90 capsule, Rfl: 2   hydrALAZINE (APRESOLINE) 25 MG tablet, Take 25 mg by mouth 3 (three) times daily., Disp: , Rfl:    isosorbide mononitrate (IMDUR) 30 MG 24 hr tablet, Take 30 mg by mouth every morning., Disp: , Rfl:    metoprolol succinate (TOPROL-XL) 25 MG 24 hr tablet, Take 1 tablet (25 mg total) by mouth every morning., Disp: 30 tablet, Rfl: 3   spironolactone (ALDACTONE) 25 MG tablet, TAKE 1 TABLET BY MOUTH ONCE DAILY IN THE MORNING, Disp: 90 tablet, Rfl: 3   Thiamine HCl (VITAMIN B-1) 250 MG tablet, Take 125 mg by mouth daily., Disp: , Rfl:    DRUGS TO CAUTION IN HEART FAILURE  Drug or Class Mechanism  Analgesics NSAIDs COX-2 inhibitors Glucocorticoids  Sodium and water retention, increased systemic vascular resistance, decreased response to diuretics   Diabetes Medications Metformin Thiazolidinediones Rosiglitazone (Avandia) Pioglitazone (Actos) DPP4 Inhibitors Saxagliptin (Onglyza) Sitagliptin (Januvia)   Lactic acidosis Possible calcium channel blockade   Unknown  Antiarrhythmics Class I  Flecainide Disopyramide Class III Sotalol Other Dronedarone  Negative inotrope, proarrhythmic   Proarrhythmic, beta blockade  Negative inotrope  Antihypertensives Alpha Blockers Doxazosin Calcium Channel Blockers Diltiazem Verapamil Nifedipine Central Alpha Adrenergics Moxonidine Peripheral Vasodilators Minoxidil  Increases renin and aldosterone  Negative inotrope    Possible sympathetic withdrawal  Unknown  Anti-infective Itraconazole Amphotericin B  Negative inotrope Unknown  Hematologic Anagrelide Cilostazol   Possible inhibition of PD IV Inhibition of PD III causing arrhythmias  Neurologic/Psychiatric Stimulants Anti-Seizure  Drugs Carbamazepine Pregabalin Antidepressants Tricyclics Citalopram Parkinsons Bromocriptine Pergolide Pramipexole Antipsychotics Clozapine Antimigraine Ergotamine Methysergide Appetite suppressants Bipolar Lithium  Peripheral alpha and beta agonist activity  Negative inotrope and chronotrope Calcium channel blockade  Negative inotrope, proarrhythmic Dose-dependent QT prolongation  Excessive serotonin activity/valvular damage Excessive serotonin activity/valvular damage Unknown  IgE mediated hypersensitivy, calcium channel blockade  Excessive serotonin activity/valvular damage Excessive serotonin activity/valvular damage Valvular damage  Direct myofibrillar degeneration, adrenergic stimulation  Antimalarials Chloroquine Hydroxychloroquine Intracellular inhibition of lysosomal enzymes  Urologic Agents Alpha Blockers Doxazosin Prazosin Tamsulosin Terazosin  Increased renin and aldosterone  Adapted from Page Carleene Overlie, et al. "Drugs That May Cause or Exacerbate Heart Failure: A Scientific Statement from the American Heart  Association." Circulation 2016; 134:e32-e69. DOI: 10.1161/CIR.0000000000000426   MEDICATION ADHERENCES TIPS AND STRATEGIES Taking medication as prescribed improves patient outcomes in heart failure (reduces hospitalizations, improves symptoms, increases survival) Side effects of medications can be managed  by decreasing doses, switching agents, stopping drugs, or adding additional therapy. Please let someone in the Heart Failure Clinic know if you have having bothersome side effects so we can modify your regimen. Do not alter your medication regimen without talking to [place].  Medication reminders can help patients remember to take drugs on time. If you are missing or forgetting doses you can try linking behaviors, using pill boxes, or an electronic reminder like an alarm on your phone or an app. Some people can also get automated phone calls as medication  reminders.

## 2022-12-12 NOTE — Progress Notes (Signed)
Patient ID: Shawn Taylor, adult    DOB: September 16, 1956, 67 y.o.   MRN: [id number]   Shawn Taylor is a 67 y/o male with a history of HTN, atrial flutter, DVT, thrombocytopenia, alcohol use,Current tobacco use and chronic heart failure.   Echo report from 12/03/21 reviewed and showed an EF of 35% along with moderate TR. Echo report from 02/14/20 reviewed and showed an EF of <20% along with moderate Shawn/TR.   Stress test done 03/04/21 showed improved EF to 50%  Has not been admitted or been in the ED in the last 6 months.    He presents today for a follow-up visit with a chief complaint of productive cough. Describes this as chronic although occurs on an intermittent basis. Has associated dizziness along with this. Denies any difficulty sleeping, abdominal distention, palpitations, pedal edema, chest pain, shortness of breath, fatigue or weight gain.   Does endorse some left sided tooth pain which is quite painful. Has upcoming dental appt on 12/23/22 so has been using BC powder and tylenol to ease the pain.   Past Medical History:  Diagnosis Date   Atrial flutter ([place])    CAD (coronary artery disease)    Cardiac arrest ([place]) 02/12/2020   WITH HIS MI   Carotid artery occlusion    Cervical myelopathy ([facility])    CHF (congestive heart failure) (HCC)    Chronic anticoagulation    Apixaban + ASA   DOE (dyspnea on exertion)    DVT (deep venous thrombosis) (HCC)    HCV (hepatitis C virus) 2019   Tx'd with Harvoni course   HLD (hyperlipidemia)    Hypertension    Migraines    Myocardial infarction ([place]) 02/12/2020   PAD (peripheral artery disease) (HCC)    Polysubstance abuse (HCC)    Cocaine and ETOH   Thrombocytopenia (HCC)    Past Surgical History:  Procedure Laterality Date   ANTERIOR CERVICAL CORPECTOMY N/A 04/17/2021   Procedure: ANTERIOR CERVICAL CORPECTOMY C4;  Surgeon: Meade Maw, MD;  Location: [facility];  Service: Neurosurgery;  Laterality: N/A;  2nd case   ANTERIOR CERVICAL  DECOMP/DISCECTOMY FUSION N/A 04/17/2021   Procedure: C5-6 ANTERIOR CERVICAL DECOMPRESSION/DISCECTOMY FUSION;  Surgeon: Meade Maw, MD;  Location: [facility];  Service: Neurosurgery;  Laterality: N/A;   ENDARTERECTOMY FEMORAL Right 07/24/2021   Procedure: ENDARTERECTOMY FEMORAL WITH STENT PLACEMENT;  Surgeon: Katha Cabal, MD;  Location: [facility];  Service: Vascular;  Laterality: Right;  Femoral endarterectomy with stent   LOWER EXTREMITY ANGIOGRAPHY Right 01/29/2021   Procedure: LOWER EXTREMITY ANGIOGRAPHY;  Surgeon: Katha Cabal, MD;  Location: [facility];  Service: Cardiovascular;  Laterality: Right;   NASAL ENDOSCOPY WITH EPISTAXIS CONTROL N/A 09/01/2021   Procedure: NASAL ENDOSCOPY WITH EPISTAXIS CONTROL;  Surgeon: Carloyn Manner, MD;  Location: [facility];  Service: ENT;  Laterality: N/A;   Family History  Problem Relation Age of Onset   Diabetes Sister    Social History   Tobacco Use   Smoking status: Every Day    Packs/day: 0.25    Years: 55.00    Total pack years: 13.75    Types: Cigarettes   Smokeless tobacco: Never  Substance Use Topics   Alcohol use: Not Currently    Alcohol/week: 2.0 - 3.0 standard drinks of alcohol    Types: 2 - 3 Cans of beer per week   No Known Allergies  Prior to Admission medications   Medication Sig Start Date End Date Taking? Authorizing Provider  acetaminophen (  TYLENOL) 500 MG tablet Take 1,000 mg by mouth every 6 (six) hours as needed.   Yes [provider]  apixaban (ELIQUIS) 5 MG TABS tablet Take 1 tablet (5 mg total) by mouth 2 (two) times daily. Patient taking differently: Take 5 mg by mouth every 12 (twelve) hours. 03/07/20  Yes Lurene Shadow, MD  aspirin 81 MG EC tablet Take 81 mg by mouth daily.   Yes [provider]  atorvastatin (LIPITOR) 40 MG tablet Take 1 tablet (40 mg total) by mouth daily at 10 pm. 03/07/20  Yes Lurene Shadow, MD  dapagliflozin propanediol (FARXIGA) 10 MG TABS tablet Take  1 tablet (10 mg total) by mouth daily. TAKING FOR HIS HEART 12/09/21  Yes Delma Freeze, FNP  ENTRESTO 49-51 MG Take 1 tablet by mouth twice daily 09/19/22  Yes Avarose Mervine, Jarold Song, FNP  folic acid (FOLVITE) 1 MG tablet Take 1 tablet (1 mg total) by mouth daily. 11/07/22  Yes Clarisa Kindred A, FNP  furosemide (LASIX) 20 MG tablet TAKE 1 TABLET BY MOUTH ONCE DAILY IN THE MORNING 07/14/22  Yes Alysen Smylie A, FNP  gabapentin (NEURONTIN) 300 MG capsule TAKE ONE CAPSULE BY MOUTH AT BEDTIME FOR PAIN Patient taking differently: Take 300 mg by mouth at bedtime. 04/16/20  Yes Revelo, Presley Raddle, MD  hydrALAZINE (APRESOLINE) 25 MG tablet Take 25 mg by mouth 3 (three) times daily.   Yes [provider]  isosorbide mononitrate (IMDUR) 30 MG 24 hr tablet Take 30 mg by mouth every morning.   Yes [provider]  metoprolol succinate (TOPROL-XL) 25 MG 24 hr tablet Take 1 tablet (25 mg total) by mouth every morning. 10/13/22  Yes Clarisa Kindred A, FNP  spironolactone (ALDACTONE) 25 MG tablet TAKE 1 TABLET BY MOUTH ONCE DAILY IN THE MORNING 08/28/22  Yes Clarisa Kindred A, FNP  Thiamine HCl (VITAMIN B-1) 250 MG tablet Take 125 mg by mouth daily.   Yes [provider]    Review of Systems  Constitutional:  Negative for appetite change and fatigue.  HENT:  Positive for dental problem (tooth pain). Negative for congestion, rhinorrhea and sore throat.   Eyes: Negative.   Respiratory:  Positive for cough (dry cough). Negative for shortness of breath.   Cardiovascular:  Negative for chest pain, palpitations and leg swelling.  Gastrointestinal:  Negative for abdominal distention and abdominal pain.  Endocrine: Negative.   Genitourinary: Negative.   Musculoskeletal:  Positive for arthralgias (legs hurt when walking). Negative for back pain and neck pain.  Skin: Negative.   Allergic/Immunologic: Negative.   Neurological:  Positive for dizziness (sometimes w/ cough). Negative for weakness and  light-headedness.  Hematological:  Negative for adenopathy. Does not bruise/bleed easily.  Psychiatric/Behavioral:  Negative for dysphoric mood and sleep disturbance (sleeping on 2 pillows). The patient is not nervous/anxious.    Vitals:   12/12/22 1011  BP: 110/62  Pulse: 94  Resp: 20  SpO2: 98%  Weight: 209 lb 4 oz (94.9 kg)   Wt Readings from Last 3 Encounters:  12/12/22 209 lb 4 oz (94.9 kg)  10/13/22 204 lb (92.5 kg)  08/21/22 202 lb (91.6 kg)   Lab Results  Component Value Date   CREATININE 1.59 (H) 10/13/2022   CREATININE 1.42 (H) 09/01/2021   CREATININE 1.09 07/27/2021   Physical Exam Vitals and nursing note reviewed.  Constitutional:      General: He is not in acute distress.    Appearance: Normal appearance. He is well-developed. He is  not ill-appearing, toxic-appearing or diaphoretic.  HENT:     Head: Normocephalic and atraumatic.  Neck:     Vascular: No JVD.  Cardiovascular:     Rate and Rhythm: Normal rate and regular rhythm.  Pulmonary:     Effort: Pulmonary effort is normal. No respiratory distress.     Breath sounds: No wheezing, rhonchi or rales.  Abdominal:     Tenderness: There is no abdominal tenderness.  Musculoskeletal:     Cervical back: Normal range of motion and neck supple.     Right lower leg: No edema.     Left lower leg: No edema.  Neurological:     General: No focal deficit present.     Mental Status: He is alert and oriented to person, place, and time.  Psychiatric:        Mood and Affect: Mood normal.        Behavior: Behavior normal.    Assessment & Plan:  1: Chronic heart failure with reduced ejection fraction- - NYHA class I - euvolemic today - weighing daily; reminded to call for an overnight weight gain of >2 pounds or a weekly weight gain of >5 pounds - weight up 5 pounds from last visit here 2 months ago - encouraged increased activity as leg pain will allow and to try and not eat too late at night - not adding salt but  does rely on others bringing food or eating convenient food - saw cardiology Petra Kuba) 12/11/21 - on GDMT of dapagliflozin, metoprolol, entresto, spironolactone  - paramedic has been unable to reach patient regarding program - BNP 02/20/20 was 532.0 - doesn't take the flu vaccine - PharmD reconciled meds w/ patient  2: HTN- - BP 110/62 - follows with PCP at [facility])  - BMP on 10/13/22 reviewed and showed sodium 137, potassium 4.8, creatinine 1.59 and GFR 48  3. Atherosclerosis of lower legs- - saw vascular (Schnier) 08/21/22 - had right femoral endarterectomy with stent on 07/24/21 - says that his leg pain is "about the same"   Medication bottles reviewed.   Return in 6 months, sooner if needed.

## 2023-02-24 ENCOUNTER — Other Ambulatory Visit: Payer: Self-pay | Admitting: Family

## 2023-04-25 ENCOUNTER — Other Ambulatory Visit: Payer: Self-pay | Admitting: Family

## 2023-05-31 ENCOUNTER — Other Ambulatory Visit: Payer: Self-pay | Admitting: Family

## 2023-06-01 NOTE — Telephone Encounter (Signed)
Spoke with patient to let him know our office received an automatic request to refill his folic acid, but per Clarisa Kindred, his primary doctor will need to refill this medication. Pt aware, agreeable, and verbalized understanding.  Pt states he has received a notice for jury duty for July 15th and requests letter to excuse him related to the severity of his symptoms.  Okay to provide patient with note for jury duty, per [facility] Pt informed he can pick up note between 8:30-4:30 at his convenience. Pt. Verbalized agreement and thanks.

## 2023-06-09 ENCOUNTER — Encounter: Payer: Self-pay | Admitting: Family

## 2023-06-09 ENCOUNTER — Ambulatory Visit: Payer: 59 | Attending: Family | Admitting: Family

## 2023-06-09 VITALS — BP 124/73 | HR 98 | Resp 16 | Wt 214.4 lb

## 2023-06-09 DIAGNOSIS — Z79899 Other long term (current) drug therapy: Secondary | ICD-10-CM | POA: Insufficient documentation

## 2023-06-09 DIAGNOSIS — I70229 Atherosclerosis of native arteries of extremities with rest pain, unspecified extremity: Secondary | ICD-10-CM | POA: Diagnosis not present

## 2023-06-09 DIAGNOSIS — I110 Hypertensive heart disease with heart failure: Secondary | ICD-10-CM | POA: Insufficient documentation

## 2023-06-09 DIAGNOSIS — I7090 Unspecified atherosclerosis: Secondary | ICD-10-CM | POA: Diagnosis not present

## 2023-06-09 DIAGNOSIS — Z7901 Long term (current) use of anticoagulants: Secondary | ICD-10-CM | POA: Insufficient documentation

## 2023-06-09 DIAGNOSIS — I428 Other cardiomyopathies: Secondary | ICD-10-CM | POA: Diagnosis not present

## 2023-06-09 DIAGNOSIS — I5022 Chronic systolic (congestive) heart failure: Secondary | ICD-10-CM

## 2023-06-09 DIAGNOSIS — I4892 Unspecified atrial flutter: Secondary | ICD-10-CM | POA: Diagnosis not present

## 2023-06-09 DIAGNOSIS — I10 Essential (primary) hypertension: Secondary | ICD-10-CM | POA: Diagnosis not present

## 2023-06-09 DIAGNOSIS — I509 Heart failure, unspecified: Secondary | ICD-10-CM | POA: Diagnosis present

## 2023-06-09 NOTE — Patient Instructions (Addendum)
Go to the vascular office and get a f/u appointment scheduled.    Ask your heart doctor to check a BMP (kidney function/ potassium)

## 2023-06-09 NOTE — Progress Notes (Signed)
PCP: [facility] (last seen 11/23) Primary Cardiologist: Tiajuana Amass, MD (last seen 05/24; returns 08/24)  HPI:  Shawn Taylor is a 67 y/o male with a history of HTN, atrial flutter, DVT, PAD s/p right femoral endarterectomy with stent 07/24/21, dyslipidemia, thrombocytopenia, alcohol use, tobacco use and chronic heart failure.   Echo 02/14/20: EF of <20% along with moderate Shawn/TR. Echo 12/03/21: EF of 35% along with moderate TR.    Stress test done 03/04/21 showed improved EF to 50%  Has not been admitted or been in the ED in the last 6 months.    He presents today for a follow-up visit with a chief complaint of Intermittent cough (improving) and dizziness when coughing. Has associated right leg pain which is worsening when standing for long periods or walking for long periods. Does not have f/u appt w/ vascular scheduled yet. Denies SOB, fatigue, chest pain, palpitations, pedal edema or difficulty sleeping.   Saw cardiology a couple of months ago and metoprolol succinate was resumed and he doesn't notice any issues with this.  His biggest concern is of his worsening intermittent right leg pain. Had femoral endarterectomy w/ stent 07/24/21 and he's concerned that his circulation is worsening.   He is now fixing his own pill box without assistance from his aunt.   ROS: All systems negative except as listed in HPI, PMH and Problem List.  SH:  Social History   Socioeconomic History   Marital status: Single    Spouse name: Not on file   Number of children: 6   Years of education: Not on file   Highest education level: Not on file  Occupational History   Not on file  Tobacco Use   Smoking status: Every Day    Current packs/day: 0.25    Average packs/day: 0.3 packs/day for 55.0 years (13.8 ttl pk-yrs)    Types: Cigarettes   Smokeless tobacco: Never  Vaping Use   Vaping status: Never Used  Substance and Sexual Activity   Alcohol use: Not Currently    Alcohol/week: 2.0  - 3.0 standard drinks of alcohol    Types: 2 - 3 Cans of beer per week   Drug use: Not Currently    Frequency: 1.0 times per week    Types: Cocaine    Comment: last documented + cocaine UDS was 10/2016   Sexual activity: Not on file  Other Topics Concern   Not on file  Social History Narrative   Not on file   Social Determinants of Health   Financial Resource Strain: Not on file  Food Insecurity: Not on file  Transportation Needs: Not on file  Physical Activity: Not on file  Stress: Not on file  Social Connections: Not on file  Intimate Partner Violence: Not on file    FH:  Family History  Problem Relation Age of Onset   Diabetes Sister     Past Medical History:  Diagnosis Date   Atrial flutter (HCC)    CAD (coronary artery disease)    Cardiac arrest (HCC) 02/12/2020   WITH HIS MI   Carotid artery occlusion    Cervical myelopathy (HCC)    CHF (congestive heart failure) (HCC)    Chronic anticoagulation    Apixaban + ASA   DOE (dyspnea on exertion)    DVT (deep venous thrombosis) (HCC)    HCV (hepatitis C virus) 2019   Tx'd with Harvoni course   HLD (hyperlipidemia)    Hypertension    Migraines  Myocardial infarction (HCC) 02/12/2020   PAD (peripheral artery disease) (HCC)    Polysubstance abuse (HCC)    Cocaine and ETOH   Thrombocytopenia (HCC)     Current Outpatient Medications  Medication Sig Dispense Refill   acetaminophen (TYLENOL) 500 MG tablet Take 1,000 mg by mouth every 6 (six) hours as needed.     apixaban (ELIQUIS) 5 MG TABS tablet Take 1 tablet (5 mg total) by mouth 2 (two) times daily. (Patient taking differently: Take 5 mg by mouth every 12 (twelve) hours.) 60 tablet 0   aspirin 81 MG EC tablet Take 81 mg by mouth daily.     atorvastatin (LIPITOR) 40 MG tablet Take 1 tablet (40 mg total) by mouth daily at 10 pm. 30 tablet 0   dapagliflozin propanediol (FARXIGA) 10 MG TABS tablet TAKE 1 TABLET BY MOUTH ONCE DAILY (FOR  HEART) 90 tablet 1    ENTRESTO 49-51 MG Take 1 tablet by mouth twice daily 60 tablet 5   folic acid (FOLVITE) 1 MG tablet Take 1 tablet by mouth once daily 30 tablet 0   furosemide (LASIX) 20 MG tablet TAKE 1 TABLET BY MOUTH ONCE DAILY IN THE MORNING 90 tablet 3   gabapentin (NEURONTIN) 300 MG capsule TAKE ONE CAPSULE BY MOUTH AT BEDTIME FOR PAIN (Patient taking differently: Take 300 mg by mouth at bedtime.) 90 capsule 2   hydrALAZINE (APRESOLINE) 25 MG tablet Take 25 mg by mouth 3 (three) times daily.     isosorbide mononitrate (IMDUR) 30 MG 24 hr tablet Take 30 mg by mouth every morning.     metoprolol succinate (TOPROL-XL) 25 MG 24 hr tablet Take 1 tablet (25 mg total) by mouth every morning. 30 tablet 3   spironolactone (ALDACTONE) 25 MG tablet TAKE 1 TABLET BY MOUTH ONCE DAILY IN THE MORNING 90 tablet 3   Thiamine HCl (VITAMIN B-1) 250 MG tablet Take 125 mg by mouth daily.     No current facility-administered medications for this visit.   Vitals:   06/09/23 1007  BP: 124/73  Pulse: 98  Resp: 16  SpO2: 97%  Weight: 214 lb 6 oz (97.2 kg)   Wt Readings from Last 3 Encounters:  06/09/23 214 lb 6 oz (97.2 kg)  12/12/22 209 lb 4 oz (94.9 kg)  10/13/22 204 lb (92.5 kg)   Lab Results  Component Value Date   CREATININE 1.59 (H) 10/13/2022   CREATININE 1.42 (H) 09/01/2021   CREATININE 1.09 07/27/2021   PHYSICAL EXAM:  General:  Well appearing. No resp difficulty HEENT: normal Neck: supple. JVP flat. No lymphadenopathy or thryomegaly appreciated. Cor: PMI normal. Regular rate & rhythm. No rubs, gallops or murmurs. Lungs: clear Abdomen: soft, nontender, nondistended. No hepatosplenomegaly. No bruits or masses.  Extremities: no cyanosis, clubbing, rash, edema Neuro: alert & oriented x3, cranial nerves grossly intact. Moves all 4 extremities w/o difficulty. Affect pleasant.   ECG: 03/27/23 showed ST   ASSESSMENT & PLAN:  1: NICM with reduced ejection fraction- - suspect likely due to HTN/ a  flutter - NYHA class I - euvolemic today - weighing daily; reminded to call for an overnight weight gain of >2 pounds or a weekly weight gain of >5 pounds - weight up 5 pounds from last visit here 6 months ago - Echo 02/14/20: EF of <20% along with moderate Shawn/TR. - Echo 12/03/21: EF of 35% along with moderate TR.  - encouraged increased activity as leg pain will allow and to try and not eat too  late at night - not adding salt but does rely on others bringing food or eating convenient food - continue dapagliflozin 10mg  daily - continue metoprolol succinate 50mg  daily - continue entresto 97/103mg  BID - continue spironolactone 25mg  daily - continue furosemide 20mg  daily - BNP 02/20/20 was 532.0  2: HTN- - BP 124/73 - [facility] PCP at [facility]  - BMP on 10/13/22 reviewed and showed sodium 137, potassium 4.8, creatinine 1.59 and GFR 48 - patient defers getting BMP done today and will ask cardiology next month to check it  3. Atherosclerosis of lower legs- - saw vascular (Schnier) 09/23 - had right femoral endarterectomy with stent on 07/24/21 - says that his leg pain is worsening especially if he stands/ walks for long periods of time - emphasized that he stop at vascular office after leaving here and get f/u scheduled - continue atorvastatin 40mg  daily  4: Atrial flutter- - continue apixaban 5mg  BID - saw cardiology (Pendyal) 05/24 - regular rhythm today  Return in 6 months, sooner if needed. Offered to make f/u PRN but he prefers to come every 6 months.

## 2023-07-29 ENCOUNTER — Other Ambulatory Visit (INDEPENDENT_AMBULATORY_CARE_PROVIDER_SITE_OTHER): Payer: Self-pay | Admitting: Vascular Surgery

## 2023-07-29 DIAGNOSIS — Z9889 Other specified postprocedural states: Secondary | ICD-10-CM

## 2023-07-29 NOTE — Progress Notes (Unsigned)
MRN : [id number]  Shawn Taylor is a 66 y.o. (04/25/1956) adult who presents with chief complaint of check circulation.  History of Present Illness:   The patient returns to the office for followup and review of the noninvasive studies.   There have been no interval changes in lower extremity symptoms. No interval shortening of the patient's claudication distance or development of rest pain symptoms. No new ulcers or wounds have occurred since the last visit.  There have been no significant changes to the patient's overall health care.  The patient denies amaurosis fugax or recent TIA symptoms. There are no documented recent neurological changes noted. There is no history of DVT, PE or superficial thrombophlebitis. The patient denies recent episodes of angina or shortness of breath.   ABI's Rt=0.66 and Lt=0.91 (previous ABI's Rt=0.50 and Lt=0.78 ) Previous duplex US of the lower extremity arterial system shows Occlusion of the right SFA stent   No outpatient medications have been marked as taking for the 07/30/23 encounter (Appointment) with Gilda Crease, Latina Craver, MD.    Past Medical History:  Diagnosis Date   Atrial flutter (HCC)    CAD (coronary artery disease)    Cardiac arrest (HCC) 02/12/2020   WITH HIS MI   Carotid artery occlusion    Cervical myelopathy (HCC)    CHF (congestive heart failure) (HCC)    Chronic anticoagulation    Apixaban + ASA   DOE (dyspnea on exertion)    DVT (deep venous thrombosis) (HCC)    HCV (hepatitis C virus) 2019   Tx'd with Harvoni course   HLD (hyperlipidemia)    Hypertension    Migraines    Myocardial infarction (HCC) 02/12/2020   PAD (peripheral artery disease) (HCC)    Polysubstance abuse (HCC)    Cocaine and ETOH   Thrombocytopenia (HCC)     Past Surgical History:  Procedure Laterality Date   ANTERIOR CERVICAL CORPECTOMY N/A 04/17/2021   Procedure: ANTERIOR  CERVICAL CORPECTOMY C4;  Surgeon: Venetia Night, MD;  Location: [facility] ORS;  Service: Neurosurgery;  Laterality: N/A;  2nd case   ANTERIOR CERVICAL DECOMP/DISCECTOMY FUSION N/A 04/17/2021   Procedure: C5-6 ANTERIOR CERVICAL DECOMPRESSION/DISCECTOMY FUSION;  Surgeon: Venetia Night, MD;  Location: [facility] ORS;  Service: Neurosurgery;  Laterality: N/A;   ENDARTERECTOMY FEMORAL Right 07/24/2021   Procedure: ENDARTERECTOMY FEMORAL WITH STENT PLACEMENT;  Surgeon: Renford Dills, MD;  Location: [facility] ORS;  Service: Vascular;  Laterality: Right;  Femoral endarterectomy with stent   LOWER EXTREMITY ANGIOGRAPHY Right 01/29/2021   Procedure: LOWER EXTREMITY ANGIOGRAPHY;  Surgeon: Renford Dills, MD;  Location: [facility]IVE [facility];  Service: Cardiovascular;  Laterality: Right;   NASAL ENDOSCOPY WITH EPISTAXIS CONTROL N/A 09/01/2021   Procedure: NASAL ENDOSCOPY WITH EPISTAXIS CONTROL;  Surgeon: Bud Face, MD;  Location: [facility] OR[facility];  Service: ENT;  Laterality: N/A;    Social History Social History   Tobacco Use   Smoking status: Every Day    Current packs/day: 0.25    Average packs/day: 0.3 packs/day for 55.0 years (13.8 ttl pk-yrs)    Types: Cigarettes   Smokeless tobacco: Never  Vaping Use   Vaping status: Never Used  Substance Use Topics   Alcohol use: Not Currently    Alcohol/week: 2.0 - 3.0 standard drinks of alcohol    Types: 2 - 3 Cans of beer per week   Drug use: Not Currently    Frequency: 1.0 times per week    Types: Cocaine    Comment: last documented + cocaine UDS was 10/2016    Family History Family History  Problem Relation Age of Onset   Diabetes Sister     No Known Allergies   REVIEW OF SYSTEMS (Negative unless checked)  Constitutional: [] Weight loss  [] Fever  [] Chills Cardiac: [] Chest pain   [] Chest pressure   [] Palpitations   [] Shortness of breath when laying flat   [] Shortness of breath with exertion. Vascular:  [x] Pain in legs with walking   [] Pain in  legs at rest  [] History of DVT   [] Phlebitis   [] Swelling in legs   [] Varicose veins   [] Non-healing ulcers Pulmonary:   [] Uses home oxygen   [] Productive cough   [] Hemoptysis   [] Wheeze  [] COPD   [] Asthma Neurologic:  [] Dizziness   [] Seizures   [] History of stroke   [] History of TIA  [] Aphasia   [] Vissual changes   [] Weakness or numbness in arm   [] Weakness or numbness in leg Musculoskeletal:   [] Joint swelling   [] Joint pain   [] Low back pain Hematologic:  [] Easy bruising  [] Easy bleeding   [] Hypercoagulable state   [] Anemic Gastrointestinal:  [] Diarrhea   [] Vomiting  [] Gastroesophageal reflux/heartburn   [] Difficulty swallowing. Genitourinary:  [] Chronic kidney disease   [] Difficult urination  [] Frequent urination   [] Blood in urine Skin:  [] Rashes   [] Ulcers  Psychological:  [] History of anxiety   []  History of major depression.  Physical Examination  There were no vitals filed for this visit. There is no height or weight on file to calculate BMI. Gen: WD/WN, NAD Head: [place]/AT, No temporalis wasting.  Ear/Nose/Throat: Hearing grossly intact, nares w/o erythema or drainage Eyes: PER, EOMI, sclera nonicteric.  Neck: Supple, no masses.  No bruit or JVD.  Pulmonary:  Good air movement, no audible wheezing, no use of accessory muscles.  Cardiac: RRR, normal S1, S2, no Murmurs. Vascular:  mild trophic changes, no open wounds Vessel Right Left  Radial Palpable Palpable  PT Not Palpable Not Palpable  DP Not Palpable Not Palpable  Gastrointestinal: soft, non-distended. No guarding/no peritoneal signs.  Musculoskeletal: M/S 5/5 throughout.  No visible deformity.  Neurologic: CN 2-12 intact. Pain and light touch intact in extremities.  Symmetrical.  Speech is fluent. Motor exam as listed above. Psychiatric: Judgment intact, Mood & affect appropriate for pt's clinical situation. Dermatologic: No rashes or ulcers noted.  No changes consistent with cellulitis.   CBC Lab Results  Component  Value Date   WBC 16.2 (H) 09/01/2021   HGB 10.1 (L) 09/01/2021   HCT 30.8 (L) 09/01/2021   MCV 88.5 09/01/2021   PLT 391 09/01/2021    BMET    Component Value Date/Time   NA 137 10/13/2022 1137   K 4.8 10/13/2022 1137   CL 110 10/13/2022 1137   CO2 18 (L) 10/13/2022 1137   GLUCOSE 107 (H) 10/13/2022 1137   BUN 34 (H) 10/13/2022 1137   CREATININE 1.59 (H) 10/13/2022 1137   CALCIUM 9.8 10/13/2022 1137   GFRNONAA 48 (L) 10/13/2022 1137   GFRAA >60 05/29/2020 1254   CrCl cannot be calculated (Patient's most recent lab result is older than  the maximum 21 days allowed.).  COAG Lab Results  Component Value Date   INR 1.2 09/01/2021   INR 1.1 04/05/2021   INR 2.3 (H) 02/14/2020    Radiology No results found.   Assessment/Plan 1. Atherosclerosis of native artery of both lower extremities with intermittent claudication (HCC)  Recommend:  The patient has evidence of atherosclerosis of the lower extremities with claudication.  The patient does not voice lifestyle limiting changes at this point in time.  Noninvasive studies do not suggest clinically significant change.  No invasive studies, angiography or surgery at this time The patient should continue walking and begin a more formal exercise program.  The patient should continue antiplatelet therapy and aggressive treatment of the lipid abnormalities  No changes in the patient's medications at this time  Continued surveillance is indicated as atherosclerosis is likely to progress with time.    The patient will continue follow up with noninvasive studies as ordered.  - VAS [place] ABI WITH/WO TBI; Future  2. Bilateral carotid artery stenosis Recommend:  Given the patient's asymptomatic subcritical stenosis no further invasive testing or surgery at this time.  Duplex ultrasound shows 1-39% stenosis bilaterally.  Continue antiplatelet therapy as prescribed Continue management of CAD, HTN and Hyperlipidemia Healthy heart  diet,  encouraged exercise at least 4 times per week Follow up in 24 months with duplex ultrasound and physical exam   3. Primary hypertension Continue antihypertensive medications as already ordered, these medications have been reviewed and there are no changes at this time.  4. Coronary artery disease involving native coronary artery of native heart with angina pectoris (HCC) Continue cardiac and antihypertensive medications as already ordered and reviewed, no changes at this time.  Continue statin as ordered and reviewed, no changes at this time  Nitrates PRN for chest pain  5. Mixed hyperlipidemia Continue statin as ordered and reviewed, no changes at this time    Levora Dredge, MD  07/29/2023 8:37 AM

## 2023-07-30 ENCOUNTER — Ambulatory Visit (INDEPENDENT_AMBULATORY_CARE_PROVIDER_SITE_OTHER): Payer: 59

## 2023-07-30 ENCOUNTER — Ambulatory Visit (INDEPENDENT_AMBULATORY_CARE_PROVIDER_SITE_OTHER): Payer: 59 | Admitting: Vascular Surgery

## 2023-07-30 ENCOUNTER — Encounter (INDEPENDENT_AMBULATORY_CARE_PROVIDER_SITE_OTHER): Payer: Self-pay | Admitting: Vascular Surgery

## 2023-07-30 VITALS — BP 122/77 | HR 98 | Resp 16 | Wt 215.6 lb

## 2023-07-30 DIAGNOSIS — I25119 Atherosclerotic heart disease of native coronary artery with unspecified angina pectoris: Secondary | ICD-10-CM

## 2023-07-30 DIAGNOSIS — E782 Mixed hyperlipidemia: Secondary | ICD-10-CM

## 2023-07-30 DIAGNOSIS — I739 Peripheral vascular disease, unspecified: Secondary | ICD-10-CM | POA: Diagnosis not present

## 2023-07-30 DIAGNOSIS — Z9889 Other specified postprocedural states: Secondary | ICD-10-CM | POA: Diagnosis not present

## 2023-07-30 DIAGNOSIS — I70213 Atherosclerosis of native arteries of extremities with intermittent claudication, bilateral legs: Secondary | ICD-10-CM | POA: Diagnosis not present

## 2023-07-30 DIAGNOSIS — I10 Essential (primary) hypertension: Secondary | ICD-10-CM

## 2023-07-30 DIAGNOSIS — I6523 Occlusion and stenosis of bilateral carotid arteries: Secondary | ICD-10-CM

## 2023-08-03 LAB — VAS US ABI WITH/WO TBI
Left ABI: 0.91
Right ABI: 0.66

## 2023-08-25 ENCOUNTER — Other Ambulatory Visit: Payer: Self-pay | Admitting: Family

## 2023-08-31 ENCOUNTER — Other Ambulatory Visit: Payer: Self-pay | Admitting: Family

## 2023-10-29 LAB — LAB REPORT - SCANNED
A1c: 7.1
EGFR: 50

## 2023-11-19 ENCOUNTER — Other Ambulatory Visit: Payer: Self-pay | Admitting: Family

## 2023-12-08 NOTE — Progress Notes (Signed)
PCP: [facility] (last seen 01/25) Primary Cardiologist: Tiajuana Amass, MD (last seen 11/24)  Chief Complaint: fatigue  HPI:  Shawn Taylor is a 68 y/o male with a history of HTN, atrial flutter, DVT, PAD s/p right femoral endarterectomy with stent 07/24/21, dyslipidemia, thrombocytopenia, alcohol use, tobacco use and chronic heart failure.   Has not been admitted or been in the ED in the last 6 months.    Echo 02/14/20: EF of <20% along with moderate Shawn/TR. Echo 12/03/21: EF of 35% along with moderate TR.    Stress test done 03/04/21 showed improved EF to 50%  He presents today for a HF follow-up visit with a chief complaint of minimal fatigue with moderate exertion. Has occasional cough and lightheadedness with cough along with this. He denies shortness of breath, chest pain, palpitations, abdominal distention, pedal edema, dizziness or weight gain. Reports sleeping well on 2 pillows. He says that he's recently had lab work done at PCP office and has been told that his glucose is high and that he needs to see a kidney doctor (currently scheduled for next week)  ROS: All systems negative except as listed in HPI, PMH and Problem List.  SH:  Social History   Socioeconomic History   Marital status: Single    Spouse name: Not on file   Number of children: 6   Years of education: Not on file   Highest education level: Not on file  Occupational History   Not on file  Tobacco Use   Smoking status: Every Day    Current packs/day: 0.25    Average packs/day: 0.3 packs/day for 55.0 years (13.8 ttl pk-yrs)    Types: Cigarettes   Smokeless tobacco: Never  Vaping Use   Vaping status: Never Used  Substance and Sexual Activity   Alcohol use: Not Currently    Alcohol/week: 2.0 - 3.0 standard drinks of alcohol    Types: 2 - 3 Cans of beer per week   Drug use: Not Currently    Frequency: 1.0 times per week    Types: Cocaine    Comment: last documented + cocaine UDS was 10/2016   Sexual  activity: Not on file  Other Topics Concern   Not on file  Social History Narrative   Not on file   Social Drivers of Health   Financial Resource Strain: Not on file  Food Insecurity: Not on file  Transportation Needs: Not on file  Physical Activity: Not on file  Stress: Not on file  Social Connections: Not on file  Intimate Partner Violence: Not on file    FH:  Family History  Problem Relation Age of Onset   Diabetes Sister     Past Medical History:  Diagnosis Date   Atrial flutter (HCC)    CAD (coronary artery disease)    Cardiac arrest (HCC) 02/12/2020   WITH HIS MI   Carotid artery occlusion    Cervical myelopathy (HCC)    CHF (congestive heart failure) (HCC)    Chronic anticoagulation    Apixaban + ASA   DOE (dyspnea on exertion)    DVT (deep venous thrombosis) (HCC)    HCV (hepatitis C virus) 2019   Tx'd with Harvoni course   HLD (hyperlipidemia)    Hypertension    Migraines    Myocardial infarction (HCC) 02/12/2020   PAD (peripheral artery disease) (HCC)    Polysubstance abuse (HCC)    Cocaine and ETOH   Thrombocytopenia (HCC)     Current Outpatient Medications  Medication Sig Dispense Refill   acetaminophen (TYLENOL) 500 MG tablet Take 1,000 mg by mouth every 6 (six) hours as needed.     apixaban (ELIQUIS) 5 MG TABS tablet Take 1 tablet (5 mg total) by mouth 2 (two) times daily. (Patient taking differently: Take 5 mg by mouth every 12 (twelve) hours.) 60 tablet 0   aspirin 81 MG EC tablet Take 81 mg by mouth daily. (Patient not taking: Reported on 06/09/2023)     atorvastatin (LIPITOR) 40 MG tablet Take 1 tablet (40 mg total) by mouth daily at 10 pm. 30 tablet 0   FARXIGA 10 MG TABS tablet TAKE 1 TABLET BY MOUTH ONCE DAILY FOR HEART 90 tablet 0   folic acid (FOLVITE) 1 MG tablet Take 1 tablet by mouth once daily 30 tablet 0   furosemide (LASIX) 20 MG tablet TAKE 1 TABLET BY MOUTH ONCE DAILY IN THE MORNING 90 tablet 3   gabapentin (NEURONTIN) 300 MG  capsule TAKE ONE CAPSULE BY MOUTH AT BEDTIME FOR PAIN (Patient taking differently: Take 300 mg by mouth at bedtime.) 90 capsule 2   metoprolol succinate (TOPROL-XL) 50 MG 24 hr tablet Take 1 tablet by mouth daily.     sacubitril-valsartan (ENTRESTO) 97-103 MG Take 1 tablet by mouth 2 (two) times daily.     spironolactone (ALDACTONE) 25 MG tablet TAKE 1 TABLET BY MOUTH ONCE DAILY IN THE MORNING 90 tablet 1   Thiamine HCl (VITAMIN B-1) 250 MG tablet Take 125 mg by mouth daily.     No current facility-administered medications for this visit.   Vitals:   12/10/23 1027  BP: 128/67  Pulse: 84  SpO2: 95%  Weight: 220 lb (99.8 kg)  Height: 5\' 8"  (1.727 m)   Wt Readings from Last 3 Encounters:  12/10/23 220 lb (99.8 kg)  07/30/23 215 lb 9.6 oz (97.8 kg)  06/09/23 214 lb 6 oz (97.2 kg)   Lab Results  Component Value Date   CREATININE 1.59 (H) 10/13/2022   CREATININE 1.42 (H) 09/01/2021   CREATININE 1.09 07/27/2021    PHYSICAL EXAM:  General:  Well appearing. No resp difficulty HEENT: normal Neck: supple. JVP flat. No lymphadenopathy or thryomegaly appreciated. Cor: PMI normal. Regular rate & rhythm. No rubs, gallops or murmurs. Lungs: clear Abdomen: soft, nontender, nondistended. No hepatosplenomegaly. No bruits or masses.  Extremities: no cyanosis, clubbing, rash, edema Neuro: alert & oriented x3, cranial nerves grossly intact. Moves all 4 extremities w/o difficulty. Affect pleasant.   ECG: not done   ASSESSMENT & PLAN:  1: NICM with reduced ejection fraction- - suspect likely due to HTN/ a flutter - NYHA class II - euvolemic today - weighing daily; reminded to call for an overnight weight gain of >2 pounds or a weekly weight gain of >5 pounds - weight up 6 pounds from last visit here 6 months ago - Echo 02/14/20: EF of <20% along with moderate Shawn/TR. - Echo 12/03/21: EF of 35% along with moderate TR.  - have scheduled updated echo for 01/06/24 - encouraged increased  activity as leg pain will allow and to try and not eat too late at night - not adding salt but does rely on others bringing food or eating convenient food - continue dapagliflozin 10mg  daily - continue furosemide 20mg  daily - continue metoprolol succinate 100mg  daily - continue entresto 97/103mg  BID - continue spironolactone 25mg  daily - will get lab results from PCP office - BNP 02/20/20 was 532.0  2: HTN- - BP 128/67 -  follows with PCP at [facility] 07/16/23 reviewed and showed sodium 141, potassium 5.5, creatinine 1.5 and GFR 51 - will get lab results faxed to [place] - says that he has upcoming nephrology appt 12/15/23  3. Atherosclerosis of lower legs- - saw vascular (Schnier) 09/24 - had right femoral endarterectomy with stent on 07/24/21 - continue atorvastatin 40mg  daily  4: Atrial flutter- - continue apixaban 5mg  BID - saw cardiology (Pendyal) 11/24 - regular rhythm today   Return 1 week after echo, sooner if needed.

## 2023-12-10 ENCOUNTER — Ambulatory Visit: Payer: 59 | Attending: Family | Admitting: Family

## 2023-12-10 ENCOUNTER — Telehealth: Payer: Self-pay | Admitting: Family

## 2023-12-10 ENCOUNTER — Encounter: Payer: Self-pay | Admitting: Family

## 2023-12-10 VITALS — BP 128/67 | HR 84 | Ht 68.0 in | Wt 220.0 lb

## 2023-12-10 DIAGNOSIS — I70229 Atherosclerosis of native arteries of extremities with rest pain, unspecified extremity: Secondary | ICD-10-CM

## 2023-12-10 DIAGNOSIS — I509 Heart failure, unspecified: Secondary | ICD-10-CM | POA: Insufficient documentation

## 2023-12-10 DIAGNOSIS — I10 Essential (primary) hypertension: Secondary | ICD-10-CM | POA: Diagnosis not present

## 2023-12-10 DIAGNOSIS — I110 Hypertensive heart disease with heart failure: Secondary | ICD-10-CM | POA: Diagnosis present

## 2023-12-10 DIAGNOSIS — I428 Other cardiomyopathies: Secondary | ICD-10-CM | POA: Diagnosis not present

## 2023-12-10 DIAGNOSIS — Z79899 Other long term (current) drug therapy: Secondary | ICD-10-CM | POA: Diagnosis not present

## 2023-12-10 DIAGNOSIS — I4892 Unspecified atrial flutter: Secondary | ICD-10-CM | POA: Diagnosis not present

## 2023-12-10 DIAGNOSIS — I7090 Unspecified atherosclerosis: Secondary | ICD-10-CM | POA: Diagnosis not present

## 2023-12-10 DIAGNOSIS — I5022 Chronic systolic (congestive) heart failure: Secondary | ICD-10-CM | POA: Diagnosis not present

## 2023-12-10 DIAGNOSIS — Z7901 Long term (current) use of anticoagulants: Secondary | ICD-10-CM | POA: Diagnosis not present

## 2023-12-10 DIAGNOSIS — E785 Hyperlipidemia, unspecified: Secondary | ICD-10-CM | POA: Insufficient documentation

## 2023-12-10 NOTE — Telephone Encounter (Signed)
BMET 10/28/23 (PCP office)  Sodium 139 Potassium 5.0 Creatinine 1.51 GFR 50 A1c 7.1% LDL 68 Triglycerides 254

## 2023-12-10 NOTE — Patient Instructions (Addendum)
Please have your echo completed. You will check in for this at the [facility]. You have to arrive 15 MINS EARLY for preparation, otherwise you will have to reschedule.  It was great to see you today, keep up the good work!

## 2024-01-06 ENCOUNTER — Ambulatory Visit: Admission: RE | Admit: 2024-01-06 | Payer: 59 | Source: Ambulatory Visit

## 2024-01-13 ENCOUNTER — Encounter: Payer: 59 | Admitting: Family

## 2024-01-19 ENCOUNTER — Other Ambulatory Visit: Payer: Self-pay | Admitting: Nephrology

## 2024-01-19 ENCOUNTER — Telehealth: Payer: Self-pay | Admitting: Family

## 2024-01-19 DIAGNOSIS — R82998 Other abnormal findings in urine: Secondary | ICD-10-CM

## 2024-01-19 DIAGNOSIS — N1831 Chronic kidney disease, stage 3a: Secondary | ICD-10-CM

## 2024-01-19 NOTE — Progress Notes (Unsigned)
 Advanced Heart Failure Clinic Note    PCP: [facility] (last seen 01/25) Primary Cardiologist: Tiajuana Amass, MD (last seen 11/24; returns tomorrow)  Chief Complaint: shortness of breath  HPI:  Shawn Taylor is a 68 y/o male with a history of HTN, atrial flutter, DVT, PAD s/p right femoral endarterectomy with stent 07/24/21, dyslipidemia, thrombocytopenia, alcohol use, tobacco use and chronic heart failure.   Has not been admitted or been in the ED in the last 6 months.    Echo 02/14/20: EF of <20% along with moderate Shawn/TR.  Stress test done 03/04/21 showed improved EF to 50%  Echo 12/03/21: EF of 35% along with moderate TR.   Echo 01/11/24: EF 45% with Grade I DD, normal RV, moderate Shawn  He presents today, with his aunt, for a HF follow-up visit with a chief complaint of minimal shortness of breath with exertion. Has associated dry cough and dizziness when coughing a lot. Sleeping well on 3 pillows. Says that he stopped smoking ~ 2 weeks ago and his cough has worsened. Denies fatigue, chest pain, palpitations, abdominal distention, pedal edema or weight gain.   Patient is fixing his own pill box instead of relying on his aunt.   ROS: All systems negative except as listed in HPI, PMH and Problem List.  SH:  Social History   Socioeconomic History   Marital status: Single    Spouse name: Not on file   Number of children: 6   Years of education: Not on file   Highest education level: Not on file  Occupational History   Not on file  Tobacco Use   Smoking status: Every Day    Current packs/day: 0.25    Average packs/day: 0.3 packs/day for 55.0 years (13.8 ttl pk-yrs)    Types: Cigarettes   Smokeless tobacco: Never  Vaping Use   Vaping status: Never Used  Substance and Sexual Activity   Alcohol use: Not Currently    Alcohol/week: 2.0 - 3.0 standard drinks of alcohol    Types: 2 - 3 Cans of beer per week   Drug use: Not Currently    Frequency: 1.0 times per week    Types:  Cocaine    Comment: last documented + cocaine UDS was 10/2016   Sexual activity: Not on file  Other Topics Concern   Not on file  Social History Narrative   Not on file   Social Drivers of Health   Financial Resource Strain: Not on file  Food Insecurity: Not on file  Transportation Needs: Not on file  Physical Activity: Not on file  Stress: Not on file  Social Connections: Not on file  Intimate Partner Violence: Not on file    FH:  Family History  Problem Relation Age of Onset   Diabetes Sister     Past Medical History:  Diagnosis Date   Atrial flutter (HCC)    CAD (coronary artery disease)    Cardiac arrest (HCC) 02/12/2020   WITH HIS MI   Carotid artery occlusion    Cervical myelopathy (HCC)    CHF (congestive heart failure) (HCC)    Chronic anticoagulation    Apixaban + ASA   DOE (dyspnea on exertion)    DVT (deep venous thrombosis) (HCC)    HCV (hepatitis C virus) 2019   Tx'd with Harvoni course   HLD (hyperlipidemia)    Hypertension    Migraines    Myocardial infarction (HCC) 02/12/2020   PAD (peripheral artery disease) (HCC)    Polysubstance  abuse (HCC)    Cocaine and ETOH   Thrombocytopenia (HCC)     Current Outpatient Medications  Medication Sig Dispense Refill   acetaminophen (TYLENOL) 500 MG tablet Take 1,000 mg by mouth every 6 (six) hours as needed.     apixaban (ELIQUIS) 5 MG TABS tablet Take 1 tablet (5 mg total) by mouth 2 (two) times daily. (Patient taking differently: Take 5 mg by mouth every 12 (twelve) hours.) 60 tablet 0   aspirin 81 MG EC tablet Take 81 mg by mouth daily.     atorvastatin (LIPITOR) 40 MG tablet Take 1 tablet (40 mg total) by mouth daily at 10 pm. 30 tablet 0   FARXIGA 10 MG TABS tablet TAKE 1 TABLET BY MOUTH ONCE DAILY FOR HEART 90 tablet 0   folic acid (FOLVITE) 1 MG tablet Take 1 tablet by mouth once daily 30 tablet 0   furosemide (LASIX) 20 MG tablet TAKE 1 TABLET BY MOUTH ONCE DAILY IN THE MORNING 90 tablet 3    gabapentin (NEURONTIN) 300 MG capsule TAKE ONE CAPSULE BY MOUTH AT BEDTIME FOR PAIN (Patient taking differently: Take 300 mg by mouth at bedtime.) 90 capsule 2   metoprolol succinate (TOPROL-XL) 100 MG 24 hr tablet Take 100 mg by mouth daily. Take with or immediately following a meal.     sacubitril-valsartan (ENTRESTO) 97-103 MG Take 1 tablet by mouth 2 (two) times daily.     spironolactone (ALDACTONE) 25 MG tablet TAKE 1 TABLET BY MOUTH ONCE DAILY IN THE MORNING 90 tablet 1   Thiamine HCl (VITAMIN B-1) 250 MG tablet Take 125 mg by mouth daily.     No current facility-administered medications for this visit.   Vitals:   01/20/24 1423  BP: 120/71  Pulse: 74  SpO2: 97%  Weight: 215 lb 3.2 oz (97.6 kg)   Wt Readings from Last 3 Encounters:  01/20/24 215 lb 3.2 oz (97.6 kg)  12/10/23 220 lb (99.8 kg)  07/30/23 215 lb 9.6 oz (97.8 kg)   Lab Results  Component Value Date   CREATININE 1.59 (H) 10/13/2022   CREATININE 1.42 (H) 09/01/2021   CREATININE 1.09 07/27/2021    PHYSICAL EXAM:  General: Well appearing. No resp difficulty HEENT: normal Neck: supple, no JVD Cor: Regular rhythm, rate. No rubs, gallops or murmurs Lungs: clear Abdomen: soft, nontender, nondistended. Extremities: no cyanosis, clubbing, rash, edema Neuro: alert & oriented X 3. Moves all 4 extremities w/o difficulty. Affect pleasant]   ECG: not done   ASSESSMENT & PLAN:  1: NICM with reduced ejection fraction- - suspect likely due to HTN/ a flutter - NYHA class II - euvolemic today - weighing daily; reminded to call for an overnight weight gain of >2 pounds or a weekly weight gain of >5 pounds - weight down 5 pounds from last visit here 6 weeks ago - Echo 02/14/20: EF of <20% along with moderate Shawn/TR. - Echo 12/03/21: EF of 35% along with moderate TR.  - Echo 01/11/24: EF 45% with Grade I DD, normal RV, moderate Shawn - not adding salt but does rely on others bringing food or eating convenient food - continue  dapagliflozin 10mg  daily - continue furosemide 20mg  daily - continue metoprolol succinate 100mg  daily - continue entresto 97/103mg  BID - continue spironolactone 12.5mg  daily - BNP 02/20/20 was 532.0  2: HTN- - BP 120/71 - follows with PCP at [facility] 01/12/24 reviewed and showed sodium 139, potassium 4.6, creatinine 1.38 and GFR  56 - saw nephrology (Kolluru) 02/25  3. Atherosclerosis of lower legs- - saw vascular (Schnier) 09/24 - had right femoral endarterectomy with stent on 07/24/21 - continue atorvastatin 40mg  daily - stopped smoking ~ 2 weeks ago  4: Atrial flutter- - continue apixaban 5mg  BID - saw cardiology (Pendyal) 01/25; returns tomorrow - regular rhythm today   Due to HF stability and [facility] cardiology follow-up, will not make a return appointment at this time. Advised patient to follow closely with cardiology but that he could return at anytime for questions or to be seen. Both he and his aunt were comfortable with this plan.   Delma Freeze, FNP 01/19/24

## 2024-01-19 NOTE — Telephone Encounter (Signed)
 Lvm to confirm appt for 01/20/24

## 2024-01-20 ENCOUNTER — Encounter: Payer: Self-pay | Admitting: Family

## 2024-01-20 ENCOUNTER — Ambulatory Visit: Payer: 59 | Attending: Family | Admitting: Family

## 2024-01-20 VITALS — BP 120/71 | HR 74 | Wt 215.2 lb

## 2024-01-20 DIAGNOSIS — I110 Hypertensive heart disease with heart failure: Secondary | ICD-10-CM | POA: Insufficient documentation

## 2024-01-20 DIAGNOSIS — Z86718 Personal history of other venous thrombosis and embolism: Secondary | ICD-10-CM | POA: Insufficient documentation

## 2024-01-20 DIAGNOSIS — I5022 Chronic systolic (congestive) heart failure: Secondary | ICD-10-CM | POA: Diagnosis not present

## 2024-01-20 DIAGNOSIS — Z87891 Personal history of nicotine dependence: Secondary | ICD-10-CM | POA: Diagnosis not present

## 2024-01-20 DIAGNOSIS — Z7901 Long term (current) use of anticoagulants: Secondary | ICD-10-CM | POA: Insufficient documentation

## 2024-01-20 DIAGNOSIS — I4892 Unspecified atrial flutter: Secondary | ICD-10-CM | POA: Diagnosis not present

## 2024-01-20 DIAGNOSIS — I509 Heart failure, unspecified: Secondary | ICD-10-CM | POA: Diagnosis not present

## 2024-01-20 DIAGNOSIS — R0602 Shortness of breath: Secondary | ICD-10-CM | POA: Diagnosis present

## 2024-01-20 DIAGNOSIS — I70229 Atherosclerosis of native arteries of extremities with rest pain, unspecified extremity: Secondary | ICD-10-CM | POA: Diagnosis not present

## 2024-01-20 DIAGNOSIS — I428 Other cardiomyopathies: Secondary | ICD-10-CM | POA: Diagnosis not present

## 2024-01-20 DIAGNOSIS — R059 Cough, unspecified: Secondary | ICD-10-CM | POA: Diagnosis not present

## 2024-01-20 DIAGNOSIS — I739 Peripheral vascular disease, unspecified: Secondary | ICD-10-CM | POA: Diagnosis not present

## 2024-01-20 DIAGNOSIS — E785 Hyperlipidemia, unspecified: Secondary | ICD-10-CM | POA: Diagnosis not present

## 2024-01-20 DIAGNOSIS — Z79899 Other long term (current) drug therapy: Secondary | ICD-10-CM | POA: Insufficient documentation

## 2024-01-20 DIAGNOSIS — I10 Essential (primary) hypertension: Secondary | ICD-10-CM

## 2024-01-20 NOTE — Patient Instructions (Signed)
 Follow with cardiology but call us in the future if you need [place] for anything in the future.

## 2024-01-26 ENCOUNTER — Ambulatory Visit: Payer: 59 | Attending: Nephrology

## 2024-02-04 ENCOUNTER — Ambulatory Visit

## 2024-02-08 ENCOUNTER — Ambulatory Visit
Admission: RE | Admit: 2024-02-08 | Discharge: 2024-02-08 | Disposition: A | Discharge status: Home / Self Care | Source: Ambulatory Visit | Attending: Nephrology | Admitting: Nephrology

## 2024-02-08 DIAGNOSIS — N1831 Chronic kidney disease, stage 3a: Secondary | ICD-10-CM | POA: Insufficient documentation

## 2024-02-08 DIAGNOSIS — R82998 Other abnormal findings in urine: Secondary | ICD-10-CM | POA: Diagnosis present

## 2024-04-01 ENCOUNTER — Other Ambulatory Visit: Payer: Self-pay | Admitting: Family

## 2024-04-06 ENCOUNTER — Other Ambulatory Visit: Payer: Self-pay | Admitting: Nurse Practitioner

## 2024-04-06 DIAGNOSIS — Z136 Encounter for screening for cardiovascular disorders: Secondary | ICD-10-CM

## 2024-04-06 DIAGNOSIS — Z87891 Personal history of nicotine dependence: Secondary | ICD-10-CM

## 2024-04-12 ENCOUNTER — Encounter (INDEPENDENT_AMBULATORY_CARE_PROVIDER_SITE_OTHER): Payer: Self-pay

## 2024-04-13 ENCOUNTER — Ambulatory Visit: Admission: RE | Admit: 2024-04-13 | Source: Ambulatory Visit

## 2024-04-27 ENCOUNTER — Ambulatory Visit
Admission: RE | Admit: 2024-04-27 | Discharge: 2024-04-27 | Disposition: A | Discharge status: Home / Self Care | Source: Ambulatory Visit | Attending: Nurse Practitioner | Admitting: Nurse Practitioner

## 2024-04-27 DIAGNOSIS — Z136 Encounter for screening for cardiovascular disorders: Secondary | ICD-10-CM | POA: Insufficient documentation

## 2024-04-27 DIAGNOSIS — Z87891 Personal history of nicotine dependence: Secondary | ICD-10-CM | POA: Insufficient documentation

## 2024-05-04 ENCOUNTER — Other Ambulatory Visit: Payer: Self-pay | Admitting: Family

## 2024-08-02 ENCOUNTER — Other Ambulatory Visit (INDEPENDENT_AMBULATORY_CARE_PROVIDER_SITE_OTHER): Payer: Self-pay | Admitting: Vascular Surgery

## 2024-08-02 DIAGNOSIS — I70213 Atherosclerosis of native arteries of extremities with intermittent claudication, bilateral legs: Secondary | ICD-10-CM

## 2024-08-05 ENCOUNTER — Encounter (INDEPENDENT_AMBULATORY_CARE_PROVIDER_SITE_OTHER): Payer: 59

## 2024-08-05 ENCOUNTER — Ambulatory Visit (INDEPENDENT_AMBULATORY_CARE_PROVIDER_SITE_OTHER): Payer: 59 | Admitting: Nurse Practitioner

## 2024-08-13 ENCOUNTER — Other Ambulatory Visit: Payer: Self-pay

## 2024-08-13 ENCOUNTER — Emergency Department: Admission: EM | Admit: 2024-08-13 | Discharge: 2024-08-13 | Disposition: A | Discharge status: Home / Self Care

## 2024-08-13 DIAGNOSIS — T63441A Toxic effect of venom of bees, accidental (unintentional), initial encounter: Secondary | ICD-10-CM | POA: Diagnosis not present

## 2024-08-13 DIAGNOSIS — T7840XA Allergy, unspecified, initial encounter: Secondary | ICD-10-CM

## 2024-08-13 DIAGNOSIS — T782XXA Anaphylactic shock, unspecified, initial encounter: Secondary | ICD-10-CM | POA: Insufficient documentation

## 2024-08-13 DIAGNOSIS — Z7901 Long term (current) use of anticoagulants: Secondary | ICD-10-CM | POA: Diagnosis not present

## 2024-08-13 DIAGNOSIS — E1122 Type 2 diabetes mellitus with diabetic chronic kidney disease: Secondary | ICD-10-CM | POA: Insufficient documentation

## 2024-08-13 DIAGNOSIS — R7989 Other specified abnormal findings of blood chemistry: Secondary | ICD-10-CM | POA: Diagnosis not present

## 2024-08-13 DIAGNOSIS — I129 Hypertensive chronic kidney disease with stage 1 through stage 4 chronic kidney disease, or unspecified chronic kidney disease: Secondary | ICD-10-CM | POA: Diagnosis not present

## 2024-08-13 DIAGNOSIS — N189 Chronic kidney disease, unspecified: Secondary | ICD-10-CM | POA: Diagnosis not present

## 2024-08-13 LAB — CBC WITH DIFFERENTIAL/PLATELET
Abs Immature Granulocytes: 0.16 K/uL — ABNORMAL HIGH (ref 0.00–0.07)
Basophils Absolute: 0 K/uL (ref 0.0–0.1)
Basophils Relative: 0 %
Eosinophils Absolute: 0.1 K/uL (ref 0.0–0.5)
Eosinophils Relative: 1 %
HCT: 48.8 % (ref 39.0–52.0)
Hemoglobin: 15.1 g/dL (ref 13.0–17.0)
Immature Granulocytes: 2 %
Lymphocytes Relative: 27 %
Lymphs Abs: 2.6 K/uL (ref 0.7–4.0)
MCH: 28.6 pg (ref 26.0–34.0)
MCHC: 30.9 g/dL (ref 30.0–36.0)
MCV: 92.4 fL (ref 80.0–100.0)
Monocytes Absolute: 0.4 K/uL (ref 0.1–1.0)
Monocytes Relative: 4 %
Neutro Abs: 6.5 K/uL (ref 1.7–7.7)
Neutrophils Relative %: 66 %
Platelets: 318 K/uL (ref 150–400)
RBC: 5.28 MIL/uL (ref 4.22–5.81)
RDW: 13.5 % (ref 11.5–15.5)
WBC: 9.8 K/uL (ref 4.0–10.5)
nRBC: 0 % (ref 0.0–0.2)

## 2024-08-13 LAB — COMPREHENSIVE METABOLIC PANEL WITH GFR
ALT: 19 U/L (ref 0–44)
AST: 21 U/L (ref 15–41)
Albumin: 3.7 g/dL (ref 3.5–5.0)
Alkaline Phosphatase: 96 U/L (ref 38–126)
Anion gap: 13 (ref 5–15)
BUN: 49 mg/dL — ABNORMAL HIGH (ref 8–23)
CO2: 20 mmol/L — ABNORMAL LOW (ref 22–32)
Calcium: 9.1 mg/dL (ref 8.9–10.3)
Chloride: 103 mmol/L (ref 98–111)
Creatinine, Ser: 1.77 mg/dL — ABNORMAL HIGH (ref 0.61–1.24)
GFR, Estimated: 42 mL/min — ABNORMAL LOW (ref >60)
Glucose, Bld: 183 mg/dL — ABNORMAL HIGH (ref 70–99)
Potassium: 4.1 mmol/L (ref 3.5–5.1)
Sodium: 136 mmol/L (ref 135–145)
Total Bilirubin: 0.8 mg/dL (ref 0.0–1.2)
Total Protein: 7.8 g/dL (ref 6.5–8.1)

## 2024-08-13 MED ORDER — EPINEPHRINE 0.3 MG/0.3ML IJ SOAJ
0.3000 mg | Freq: Once | INTRAMUSCULAR | Status: AC
  Administered 2024-08-13: 0.3 mg via INTRAMUSCULAR
  Filled 2024-08-13: qty 0.3

## 2024-08-13 MED ORDER — EPINEPHRINE 0.3 MG/0.3ML IJ SOAJ: 0.3000 mg | INTRAMUSCULAR | 1 refills | Status: AC | PRN

## 2024-08-13 MED ORDER — PREDNISONE 20 MG PO TABS: 20.0000 mg | ORAL_TABLET | Freq: Two times a day (BID) | ORAL | 0 refills | Status: AC

## 2024-08-13 NOTE — ED Notes (Signed)
 Called CCMD to place patient on monitor

## 2024-08-13 NOTE — ED Provider Notes (Signed)
 [facility] Provider Note    Event Date/Time   First MD Initiated Contact with Patient 08/13/24 [phone]     (approximate)   History   Allergic Reaction   HPI  Shawn Taylor is a 68 y.o. adult  h/o atrial flutter, chronic anticoagulation on eliquis , HTN, dyslipidemia, DMII, CKD (followed by nephrology Kolluru) who presents with facial swelling, voice changes, shortness of breath 50 minutes after being stung multiple times by bees.  Patient was in his normal state of health and was taking out his trash and was stung multiple times to his right face by bees.  Upon EMS arrival he was hypotensive and had swelling of his lips and tongue.  He received 0.3 mg of epinephrine , 125 mg of Solu-Medrol , 20 mg of Pepcid , 50 mg of Benadryl , 300 cc of normal saline, and an albuterol  nebulizer en route.  Patient reports that his facial swelling is better however reports that his voice changes and difficulty swallowing initially improved but is now again getting worse.  His voice does not sound the same to him.  Denies any chest pain or abdominal pain      Physical Exam   Triage Vital Signs: ED Triage Vitals  Encounter Vitals Group     BP 08/13/24 0844 (!) 138/57     Girls Systolic BP Percentile --      Girls Diastolic BP Percentile --      Boys Systolic BP Percentile --      Boys Diastolic BP Percentile --      Pulse Rate 08/13/24 0844 85     Resp --      Temp --      Temp src --      SpO2 08/13/24 0844 97 %     Weight --      Height --      Head Circumference --      Peak Flow --      Pain Score 08/13/24 0843 0     Pain Loc --      Pain Education --      Exclude from Growth Chart --     Most recent vital signs: Vitals:   08/13/24 1030 08/13/24 1130  BP: 126/63 129/79  Pulse: 79 73  Resp:  20  Temp:    SpO2: 93% 94%    Nursing Triage Note reviewed. Vital signs reviewed and patients oxygen saturation is normoxic  General: Patient is well nourished, well  developed, awake and alert, resting comfortably in no acute distress Head: Normocephalic, no visible stingers but does have maculopapules on right face with evidence of recent bleeding Eyes: Normal inspection, extraocular muscles intact, no conjunctival pallor Ear, nose, throat: Lips with mild edema tongue edematous, no uvular edema, floor of mouth soft Neck: Normal range of motion, very mild stridor Respiratory: Patient is in mild respiratory distress, lungs mild wheezes Cardiovascular: Patient is tachycardic, RR without murmur appreciated GI: Abd SNT with no guarding or rebound  Back: Normal inspection of the back with good strength and range of motion throughout all ext Extremities: pulses intact with good cap refills, no LE pitting edema or calf tenderness Neuro: The patient is alert and oriented to person, place, and time, appropriately conversive, with 5/5 bilat UE/LE strength, no gross motor or sensory defects noted. Coordination appears to be adequate. Skin: Warm, dry, and intact Psych: normal mood and affect, no SI or HI  ED Results / Procedures / Treatments   Labs (all  labs ordered are listed, but only abnormal results are displayed) Labs Reviewed  CBC WITH DIFFERENTIAL/PLATELET - Abnormal; Notable for the following components:      Result Value   Abs Immature Granulocytes 0.16 (*)    All other components within normal limits  COMPREHENSIVE METABOLIC PANEL WITH GFR - Abnormal; Notable for the following components:   CO2 20 (*)    Glucose, Bld 183 (*)    BUN 49 (*)    Creatinine, Ser 1.77 (*)    GFR, Estimated 42 (*)    All other components within normal limits     EKG EKG and rhythm strip are interpreted by myself:   EKG: [Normal sinus rhythm] at heart rate of 80, normal QRS duration, QTc 439, nonspecific ST segments and T waves no ectopy EKG not consistent with Acute STEMI Rhythm strip: NSR in lead II   RADIOLOGY None    PROCEDURES:  Critical Care performed:  Yes, see critical care procedure note(s)  .Critical Care  Performed by: Nicholaus Rolland BRAVO, MD Authorized by: Nicholaus Rolland BRAVO, MD   Critical care provider statement:    Critical care time (minutes):  35   Critical care was necessary to treat or prevent imminent or life-threatening deterioration of the following conditions: Anaphylaxis.   Critical care was time spent personally by me on the following activities:  Development of treatment plan with patient or surrogate, discussions with consultants, evaluation of patient's response to treatment, examination of patient, ordering and review of laboratory studies, ordering and review of radiographic studies, ordering and performing treatments and interventions, pulse oximetry, re-evaluation of patient's condition and review of old charts Comments:     Requiring intramuscular epinephrine  and frequent reassessments    MEDICATIONS ORDERED IN ED: Medications  EPINEPHrine  (EPI-PEN) injection 0.3 mg (0.3 mg Intramuscular Given 08/13/24 0848)     IMPRESSION / MDM / ASSESSMENT AND PLAN / ED COURSE                                Differential diagnosis includes, but is not limited to, anaphylaxis, allergic reaction, anemia   ED course: Patient arrives acutely still stridulous with intraoral swelling despite receiving epinephrine  Solu-Medrol  Benadryl  and Pepcid  by EMS.  Given his stridor decision made to repeat the dose of epinephrine  and he was given this intramuscularly.  He was continued to be observed and had improvement.  He was able to tolerate p.o.  He is ambulated without any shortness of breath and feels comfortable returning home.  I have symptom prescription for an EpiPen  and 3 days of prednisone .  He will call his nephrology team and let them know regarding his elevation of creatinine and to have this rechecked in a week.  All questions answered patient voiced understanding and requested discharge   Clinical Course as of 08/13/24 1251  Sat  Aug 13, 2024  0906 Patient reassessed.  States he feels improved after the second dose of epinephrine .  No stridor on my exam and tongue edema significantly improved [HD]  0927 Creatinine(!): 1.77 Slightly more elevated than baseline but expected in the setting of 2 doses of epinephrine  [HD]  0927 WBC: 9.8 No leukocytosis [HD]  0927 Hemoglobin: 15.1 No anemia [HD]  0955 Patient reassessed now room to room air. States that he feels a whole lot better.  Will attempt sips of clears [HD]  1100 Patient states that he feels 100% improved.  Will attempt for p.o. [  HD]  1210 Patient reassessed remains feeling improved [HD]    Clinical Course User Index [HD] Nicholaus Rolland BRAVO, MD   -- Risk: 5 This patient has a high risk of morbidity due to further diagnostic testing or treatment. Rationale: This patient's evaluation and management involve a high risk of morbidity due to the potential severity of presenting symptoms, need for diagnostic testing, and/or initiation of treatment that may require close monitoring. The differential includes conditions with potential for significant deterioration or requiring escalation of care. Treatment decisions in the ED, including medication administration, procedural interventions, or disposition planning, reflect this level of risk. COPA: 5 The patient has the following acute or chronic illness/injury that poses a possible threat to life or bodily function: [X] : The patient has a potentially serious acute condition or an acute exacerbation of a chronic illness requiring urgent evaluation and management in the Emergency Department. The clinical presentation necessitates immediate consideration of life-threatening or function-threatening diagnoses, even if they are ultimately ruled out.   FINAL CLINICAL IMPRESSION(S) / ED DIAGNOSES   Final diagnoses:  Anaphylaxis, initial encounter  Allergic reaction, initial encounter  Bee sting, accidental or unintentional,  initial encounter     Rx / DC Orders   ED Discharge Orders          Ordered    EPINEPHrine  (EPIPEN  2-PAK) 0.3 mg/0.3 mL IJ SOAJ injection  As needed        08/13/24 1103    predniSONE  (DELTASONE ) 20 MG tablet  2 times daily with meals        08/13/24 1103             Note:  This document was prepared using Dragon voice recognition software and may include unintentional dictation errors.   Nicholaus Rolland BRAVO, MD 08/13/24 [phone]

## 2024-08-13 NOTE — Discharge Instructions (Addendum)
 You were seen in the emergency department after an allergic reaction to bees.  Your creatinine was slightly elevated today at 1.77 which is likely due to the medications we have given you.  Please call your nephrologist or your primary care physician to recheck this number in 1 week.  Your next dose of steroids will be due this evening.  Carry the epinephrine  pen on you.  If you notice a reoccurrence of symptoms you can take 50 mg of Benadryl  (over-the-counter) as well.  If your symptoms are acutely worsening please return to the emergency department. -- RETURN PRECAUTIONS & AFTERCARE: (ENGLISH) RETURN PRECAUTIONS: Return immediately to the emergency department or see/call your doctor if you feel worse, weak or have changes in speech or vision, are short of breath, have fever, vomiting, pain, bleeding or dark stool, trouble urinating or any new issues. Return here or see/call your doctor if not improving as expected for your suspected condition. FOLLOW-UP CARE: Call your doctor and/or any doctors we referred you to for more advice and to make an appointment. Do this today, tomorrow or after the weekend. Some doctors only take PPO insurance so if you have HMO insurance you may want to contact your HMO or your regular doctor for referral to a specialist within your plan. Either way tell the doctor's office that it was a referral from the emergency department so you get the soonest possible appointment.  YOUR TEST RESULTS: Take result reports of any blood or urine tests, imaging tests and EKG's to your doctor and any referral doctor. Have any abnormal tests repeated. Your doctor or a referral doctor can let you know when this should be done. Also make sure your doctor contacts this hospital to get any test results that are not currently available such as cultures or special tests for infection and final imaging reports, which are often not available at the time you leave the ER but which may list additional  important findings that are not documented on the preliminary report. BLOOD PRESSURE: If your blood pressure was greater than 120/80 have your blood pressure rechecked within 1 to 2 weeks. MEDICATION SIDE EFFECTS: Do not drive, walk, bike, take the bus, etc. if you have received or are being prescribed any sedating medications such as those for pain or anxiety or certain antihistamines like Benadryl . If you have been give one of these here get a taxi home or have a friend drive you home. Ask your pharmacist to counsel you on potential side effects of any new medication

## 2024-08-13 NOTE — ED Triage Notes (Signed)
 Patient was taking out the trash and was stung multiple times by bees; EMS reports patient was diaphoretic and hypotensive 90/60 and HR 106.  Epi 0.3mg  @ 0810 Benadryl  50mg  Pepcid  20mg  Zofran  4mg  Solumedrol 125mg  NS 300mL

## 2024-08-13 NOTE — ED Notes (Signed)
 Patient states symptoms have improved and he feels much better; reduced O2 to 2L for trial.

## 2024-08-13 NOTE — ED Notes (Signed)
 Titrated patient off of O2.

## 2024-09-08 ENCOUNTER — Encounter (INDEPENDENT_AMBULATORY_CARE_PROVIDER_SITE_OTHER): Payer: Self-pay | Admitting: Vascular Surgery

## 2024-09-08 ENCOUNTER — Ambulatory Visit (INDEPENDENT_AMBULATORY_CARE_PROVIDER_SITE_OTHER): Admitting: Vascular Surgery

## 2024-09-08 ENCOUNTER — Ambulatory Visit (INDEPENDENT_AMBULATORY_CARE_PROVIDER_SITE_OTHER)

## 2024-09-08 VITALS — BP 135/71 | HR 73 | Resp 18 | Ht 68.0 in | Wt 218.8 lb

## 2024-09-08 DIAGNOSIS — I25119 Atherosclerotic heart disease of native coronary artery with unspecified angina pectoris: Secondary | ICD-10-CM

## 2024-09-08 DIAGNOSIS — I70213 Atherosclerosis of native arteries of extremities with intermittent claudication, bilateral legs: Secondary | ICD-10-CM

## 2024-09-08 DIAGNOSIS — E782 Mixed hyperlipidemia: Secondary | ICD-10-CM | POA: Diagnosis not present

## 2024-09-08 DIAGNOSIS — I10 Essential (primary) hypertension: Secondary | ICD-10-CM

## 2024-09-08 NOTE — Progress Notes (Signed)
 Subjective:    Patient ID: Shawn Taylor, adult    DOB: Nov 11, 1956, 68 y.o.   MRN: [id number] Chief Complaint  Patient presents with   Follow-up    1 year follow up w/ ABI    Shawn Taylor returns to the office for 1 year follow up and review of the noninvasive studies.    There have been interval changes in lower extremity symptoms. He endorses interval shortening of the patient's claudication distance or development of rest pain symptoms. No new ulcers or wounds have occurred since the last visit.   There have been no significant changes to the patient's overall health care.   The patient denies amaurosis fugax or recent TIA symptoms. There are no documented recent neurological changes noted. There is no history of DVT, PE or superficial thrombophlebitis. The patient denies recent episodes of angina or shortness of breath.    ABI's Rt=0.46 and Lt=0.71 (previous ABI's Rt=0.66 and Lt=0.91 ) Todays duplex US  of the lower extremity arterial system shows Occlusion of the right SFA stent      Review of Systems  Constitutional: Negative.   Musculoskeletal:  Positive for myalgias.       Leg Pain when walking and resting. Intermittent but severe.   All other systems reviewed and are negative.      Objective:   Physical Exam Constitutional:      Appearance: Normal appearance. He is obese.  HENT:     Head: Normocephalic.  Eyes:     Pupils: Pupils are equal, round, and reactive to light.  Cardiovascular:     Rate and Rhythm: Normal rate. Rhythm irregular.     Pulses: Normal pulses.     Heart sounds: Normal heart sounds.     Comments: Hx of Atrial Flutter  Pulmonary:     Effort: Pulmonary effort is normal.     Breath sounds: Normal breath sounds.  Abdominal:     General: Bowel sounds are normal.     Palpations: Abdomen is soft.  Musculoskeletal:        General: Tenderness present.     Comments: Tenderness and sharp pain to right lower extremity intermittently while  ambulating and at rest.   Skin:    General: Skin is warm and dry.     Capillary Refill: Capillary refill takes more than 3 seconds. Delayed refilling in right lower extremity Neurological:     General: No focal deficit present.     Mental Status: He is alert and oriented to person, place, and time. Mental status is at baseline.  Psychiatric:        Mood and Affect: Mood normal.        Behavior: Behavior normal.        Thought Content: Thought content normal.        Judgment: Judgment normal.     BP 135/71 (BP Location: Left Arm, Patient Position: Sitting, Cuff Size: Normal)   Pulse 73   Resp 18   Ht 5' 8 (1.727 m)   Wt 218 lb 12.8 oz (99.2 kg)   BMI 33.27 kg/m   Past Medical History:  Diagnosis Date   Atrial flutter (HCC)    CAD (coronary artery disease)    Cardiac arrest (HCC) 02/12/2020   WITH HIS MI   Carotid artery occlusion    Cervical myelopathy (HCC)    CHF (congestive heart failure) (HCC)    Chronic anticoagulation    Apixaban  + ASA   DOE (dyspnea on exertion)  DVT (deep venous thrombosis) (HCC)    HCV (hepatitis C virus) 2019   Tx'd with Harvoni course   HLD (hyperlipidemia)    Hypertension    Migraines    Myocardial infarction (HCC) 02/12/2020   PAD (peripheral artery disease)    Polysubstance abuse (HCC)    Cocaine and ETOH   Thrombocytopenia     Social History   Socioeconomic History   Marital status: Single    Spouse name: Not on file   Number of children: 6   Years of education: Not on file   Highest education level: Not on file  Occupational History   Not on file  Tobacco Use   Smoking status: Every Day    Current packs/day: 0.25    Average packs/day: 0.3 packs/day for 55.0 years (13.8 ttl pk-yrs)    Types: Cigarettes   Smokeless tobacco: Never  Vaping Use   Vaping status: Never Used  Substance and Sexual Activity   Alcohol use: Not Currently    Alcohol/week: 2.0 - 3.0 standard drinks of alcohol    Types: 2 - 3 Cans of beer per  week   Drug use: Not Currently    Frequency: 1.0 times per week    Types: Cocaine    Comment: last documented + cocaine UDS was 10/2016   Sexual activity: Not on file  Other Topics Concern   Not on file  Social History Narrative   Not on file   Social Drivers of Health   Financial Resource Strain: Not on file  Food Insecurity: Not on file  Transportation Needs: Not on file  Physical Activity: Not on file  Stress: Not on file  Social Connections: Not on file  Intimate Partner Violence: Not on file    Past Surgical History:  Procedure Laterality Date   ANTERIOR CERVICAL CORPECTOMY N/A 04/17/2021   Procedure: ANTERIOR CERVICAL CORPECTOMY C4;  Surgeon: Clois Fret, MD;  Location: [facility] OR[facility];  Service: Neurosurgery;  Laterality: N/A;  2nd case   ANTERIOR CERVICAL DECOMP/DISCECTOMY FUSION N/A 04/17/2021   Procedure: C5-6 ANTERIOR CERVICAL DECOMPRESSION/DISCECTOMY FUSION;  Surgeon: Clois Fret, MD;  Location: [facility] OR[facility];  Service: Neurosurgery;  Laterality: N/A;   ENDARTERECTOMY FEMORAL Right 07/24/2021   Procedure: ENDARTERECTOMY FEMORAL WITH STENT PLACEMENT;  Surgeon: Jama Cordella MATSU, MD;  Location: [facility];  Service: Vascular;  Laterality: Right;  Femoral endarterectomy with stent   LOWER EXTREMITY ANGIOGRAPHY Right 01/29/2021   Procedure: LOWER EXTREMITY ANGIOGRAPHY;  Surgeon: Jama Cordella MATSU, MD;  Location: [facility];  Service: Cardiovascular;  Laterality: Right;   NASAL ENDOSCOPY WITH EPISTAXIS CONTROL N/A 09/01/2021   Procedure: NASAL ENDOSCOPY WITH EPISTAXIS CONTROL;  Surgeon: Milissa Hamming, MD;  Location: [facility];  Service: ENT;  Laterality: N/A;    Family History  Problem Relation Age of Onset   Diabetes Sister     No Known Allergies     Latest Ref Rng & Units 08/13/2024    8:53 AM 09/01/2021   10:26 AM 09/01/2021    6:30 AM  CBC  WBC 4.0 - 10.5 K/uL 9.8  16.2  16.4   Hemoglobin 13.0 - 17.0 g/dL 84.8  89.8  89.3   Hematocrit 39.0 - 52.0 %  48.8  30.8  32.2   Platelets 150 - 400 K/uL 318  391  424       CMP     Component Value Date/Time   NA 136 08/13/2024 0853   K 4.1 08/13/2024 0853   CL 103 08/13/2024 0853  CO2 20 (L) 08/13/2024 0853   GLUCOSE 183 (H) 08/13/2024 0853   BUN 49 (H) 08/13/2024 0853   CREATININE 1.77 (H) 08/13/2024 0853   CALCIUM  9.1 08/13/2024 0853   PROT 7.8 08/13/2024 0853   ALBUMIN  3.7 08/13/2024 0853   AST 21 08/13/2024 0853   ALT 19 08/13/2024 0853   ALKPHOS 96 08/13/2024 0853   BILITOT 0.8 08/13/2024 0853   EGFR 50.0 10/29/2023 1122   GFRNONAA 42 (L) 08/13/2024 0853     No results found.     Assessment & Plan:   1. Atherosclerosis of native artery of both lower extremities with intermittent claudication (Primary) Recommend:  The patient has experienced increased claudication symptoms and is now describing lifestyle limiting claudication and appears to be having mild rest pain symptroms.  Given the severity of the patient's severe Right lower extremity symptoms the patient should undergo angiography with the hope for intervention.  Risk and benefits were reviewed the patient.  Indications for the procedure were reviewed.  All questions were answered, the patient agrees to proceed with Right lower extremity angiography and possible intervention.   The patient should continue walking and begin a more formal exercise program.  The patient should continue antiplatelet therapy and aggressive treatment of the lipid abnormalities  The patient will follow up with me after the angiogram.   2. Primary hypertension Continue antihypertensive medications as already ordered, these medications have been reviewed and there are no changes at this time.   3. Coronary artery disease involving native coronary artery of native heart with angina pectoris Continue cardiac and antihypertensive medications as already ordered and reviewed, no changes at this time.   Continue statin as ordered and  reviewed, no changes at this time   Nitrates PRN for chest pain  4. Mixed hyperlipidemia Continue statin as ordered and reviewed, no changes at this time   Current Outpatient Medications on File Prior to Visit  Medication Sig Dispense Refill   acetaminophen  (TYLENOL ) 500 MG tablet Take 1,000 mg by mouth every 6 (six) hours as needed.     apixaban  (ELIQUIS ) 5 MG TABS tablet Take 1 tablet (5 mg total) by mouth 2 (two) times daily. (Patient taking differently: Take 5 mg by mouth every 12 (twelve) hours.) 60 tablet 0   aspirin  81 MG EC tablet Take 81 mg by mouth daily.     atorvastatin  (LIPITOR) 40 MG tablet Take 1 tablet (40 mg total) by mouth daily at 10 pm. 30 tablet 0   EPINEPHrine  (EPIPEN  2-PAK) 0.3 mg/0.3 mL IJ SOAJ injection Inject 0.3 mg into the muscle as needed for anaphylaxis. 1 each 1   FARXIGA  10 MG TABS tablet TAKE 1 TABLET BY MOUTH ONCE DAILY FOR HEART 90 tablet 0   folic acid  (FOLVITE ) 1 MG tablet Take 1 tablet by mouth once daily 30 tablet 0   furosemide  (LASIX ) 20 MG tablet TAKE 1 TABLET BY MOUTH ONCE DAILY IN THE MORNING 90 tablet 3   gabapentin  (NEURONTIN ) 300 MG capsule TAKE ONE CAPSULE BY MOUTH AT BEDTIME FOR PAIN (Patient taking differently: Take 300 mg by mouth at bedtime.) 90 capsule 2   metoprolol  succinate (TOPROL -XL) 100 MG 24 hr tablet Take 100 mg by mouth daily. Take with or immediately following a meal.     sacubitril -valsartan  (ENTRESTO ) 97-103 MG Take 1 tablet by mouth 2 (two) times daily.     spironolactone  (ALDACTONE ) 25 MG tablet TAKE 1 TABLET BY MOUTH ONCE DAILY IN THE MORNING 90 tablet 3  Thiamine  HCl (VITAMIN B-1) 250 MG tablet Take 125 mg by mouth daily.     No current facility-administered medications on file prior to visit.    There are no Patient Instructions on file for this visit. No follow-ups on file.   Gwendlyn JONELLE Shank, NP

## 2024-09-12 LAB — VAS US ABI WITH/WO TBI
Left ABI: 0.71
Right ABI: 0.46

## 2024-09-30 ENCOUNTER — Telehealth (INDEPENDENT_AMBULATORY_CARE_PROVIDER_SITE_OTHER): Payer: Self-pay

## 2024-09-30 NOTE — Telephone Encounter (Signed)
 Spoke with the patient and he will call the office back to schedule his procedure with Dr. Jama after he has completed some situations that have presented themselves.
